# Patient Record
Sex: Female | Born: 1955
Health system: Southern US, Community
[De-identification: ages and names within clinical notes are randomized; demographics above are authoritative.]

## PROBLEM LIST (undated history)

## (undated) DIAGNOSIS — D689 Coagulation defect, unspecified: Secondary | ICD-10-CM

## (undated) DIAGNOSIS — I809 Phlebitis and thrombophlebitis of unspecified site: Secondary | ICD-10-CM

## (undated) DIAGNOSIS — R011 Cardiac murmur, unspecified: Secondary | ICD-10-CM

## (undated) DIAGNOSIS — E785 Hyperlipidemia, unspecified: Secondary | ICD-10-CM

## (undated) DIAGNOSIS — K219 Gastro-esophageal reflux disease without esophagitis: Secondary | ICD-10-CM

## (undated) DIAGNOSIS — M7660 Achilles tendinitis, unspecified leg: Secondary | ICD-10-CM

## (undated) DIAGNOSIS — Z86711 Personal history of pulmonary embolism: Principal | ICD-10-CM

## (undated) DIAGNOSIS — K7581 Nonalcoholic steatohepatitis (NASH): Secondary | ICD-10-CM

## (undated) DIAGNOSIS — Z86718 Personal history of other venous thrombosis and embolism: Secondary | ICD-10-CM

## (undated) DIAGNOSIS — M1990 Unspecified osteoarthritis, unspecified site: Secondary | ICD-10-CM

## (undated) DIAGNOSIS — K5792 Diverticulitis of intestine, part unspecified, without perforation or abscess without bleeding: Secondary | ICD-10-CM

## (undated) DIAGNOSIS — K5790 Diverticulosis of intestine, part unspecified, without perforation or abscess without bleeding: Secondary | ICD-10-CM

## (undated) DIAGNOSIS — J45909 Unspecified asthma, uncomplicated: Secondary | ICD-10-CM

## (undated) DIAGNOSIS — R42 Dizziness and giddiness: Secondary | ICD-10-CM

## (undated) DIAGNOSIS — K7460 Unspecified cirrhosis of liver: Secondary | ICD-10-CM

## (undated) DIAGNOSIS — Z8619 Personal history of other infectious and parasitic diseases: Secondary | ICD-10-CM

## (undated) DIAGNOSIS — G4730 Sleep apnea, unspecified: Secondary | ICD-10-CM

## (undated) DIAGNOSIS — I10 Essential (primary) hypertension: Secondary | ICD-10-CM

## (undated) DIAGNOSIS — M722 Plantar fascial fibromatosis: Secondary | ICD-10-CM

## (undated) HISTORY — PX: FOOT SURGERY: SHX648

## (undated) HISTORY — PX: KNEE SURGERY: SHX244

## (undated) HISTORY — DX: Essential (primary) hypertension: I10

## (undated) HISTORY — DX: Personal history of other venous thrombosis and embolism: Z86.718

## (undated) HISTORY — PX: KNEE ARTHROSCOPY: SHX127

## (undated) HISTORY — DX: Plantar fascial fibromatosis: M72.2

## (undated) HISTORY — PX: COLONOSCOPY: SHX174

## (undated) HISTORY — PX: ABDOMINAL HYSTERECTOMY: SHX81

## (undated) HISTORY — DX: Hyperlipidemia, unspecified: E78.5

## (undated) HISTORY — DX: Gastro-esophageal reflux disease without esophagitis: K21.9

## (undated) HISTORY — DX: Phlebitis and thrombophlebitis of unspecified site: I80.9

## (undated) HISTORY — PX: OTHER SURGICAL HISTORY: SHX169

## (undated) HISTORY — PX: KNEE ARTHROSCOPY: SUR90

## (undated) HISTORY — DX: Personal history of other infectious and parasitic diseases: Z86.19

## (undated) HISTORY — DX: Coagulation defect, unspecified: D68.9

## (undated) HISTORY — DX: Unspecified osteoarthritis, unspecified site: M19.90

## (undated) HISTORY — DX: Diverticulitis of intestine, part unspecified, without perforation or abscess without bleeding: K57.92

## (undated) HISTORY — DX: Cardiac murmur, unspecified: R01.1

## (undated) HISTORY — DX: Achilles tendinitis, unspecified leg: M76.60

## (undated) HISTORY — DX: Sleep apnea, unspecified: G47.30

## (undated) HISTORY — DX: Personal history of pulmonary embolism: Z86.711

---

## 2008-02-04 DIAGNOSIS — O2230 Deep phlebothrombosis in pregnancy, unspecified trimester: Secondary | ICD-10-CM

## 2008-02-04 HISTORY — DX: Deep phlebothrombosis in pregnancy, unspecified trimester: O22.30

## 2009-09-03 DEATH — deceased

## 2010-07-18 ENCOUNTER — Ambulatory Visit (INDEPENDENT_AMBULATORY_CARE_PROVIDER_SITE_OTHER): Admit: 2010-07-18 | Discharge: 2010-07-18 | Payer: Self-pay | Source: Ambulatory Visit

## 2014-08-09 ENCOUNTER — Encounter: Payer: Self-pay | Admitting: Rehabilitative and Restorative Service Providers"

## 2014-08-09 ENCOUNTER — Inpatient Hospital Stay
Payer: No Typology Code available for payment source | Attending: Internal Medicine | Admitting: Rehabilitative and Restorative Service Providers"

## 2014-08-09 VITALS — BP 112/74 | HR 68

## 2014-08-09 DIAGNOSIS — M25511 Pain in right shoulder: Secondary | ICD-10-CM | POA: Insufficient documentation

## 2014-08-09 DIAGNOSIS — M6281 Muscle weakness (generalized): Secondary | ICD-10-CM | POA: Insufficient documentation

## 2014-08-09 DIAGNOSIS — R29898 Other symptoms and signs involving the musculoskeletal system: Secondary | ICD-10-CM

## 2014-08-09 DIAGNOSIS — M25611 Stiffness of right shoulder, not elsewhere classified: Secondary | ICD-10-CM | POA: Insufficient documentation

## 2014-08-09 NOTE — PT/OT Therapy Note (Signed)
INITIAL EVALUATION (Shoulder)    Name: Monica Romero Age: 59 y.o. Occupation: Estate manager/land agent SOC: 08/09/2014  Referring Physician: Elyse Hsu, MD MD recheck: TBS DOS:  N/A DOI: Onset of Problem / Injury: 07/10/14  # of Authorized Visits:   Visit #     Diagnosis (Treating/Medical):     ICD-10-CM    1. Right shoulder pain M25.511    2. Decreased range of motion of right shoulder M25.611    3. Right arm weakness M62.81          SUBJECTIVE:    Mechanism of Injury: Patient states shoulder discomfort off and on since beginning of the year (2016).  Patient states she has history of fall in 2012 and has had problems with R side of her body since the fall.  Patient states pain is usually during the night.  Patient states the aches caused her to see PCP.  PCP thinks RTC but was unsure and referred to PT.  Patient states no images have been taken.      Patient's reason for seeking PT /Goal: Patient states trouble sleeping at night secondary to pain and getting comfortable as she sleeps on the R side but states other factors wake her up at night.  Patient with trouble with ADLs including showering, behind the back tasks or over head tasks.  Patient is not able to carry anything on R side secondary to pain and has started carrying everything in L hand or on L side.  Patient states this discomfort has not kept her from exercising or working as she works through the pain but she is not going as often.      Past Medical History:   Past Medical History   Diagnosis Date   . Hypertension    . DVT (deep vein thrombosis) in pregnancy 2010     Medications: No outpatient prescriptions have been marked as taking for the 08/09/14 encounter (Clinical Support) with Lourena Simmonds, PT.   Fall Risks: Low    Other Treatment/Prior Therapy: No  Prior Hospitalization: No  Hand Dominance: Dominant Hand: Right Involved Side: Involved Side: Right   DiagnosticTests: no recent images    Outcome Measure:         SPADI R Score: 67                                 % Pain Score: 37%  /10   PLOF: Patient states no previous shoulder injuries but has had on and off again shoulder irritation since fall in 2012.  Patient states that she was previously working out 3 times a week and walking around her neighborhood.  Patient was not previously limited with ADLs.  Living Environment: Type of Residence: One story home/apartment    Patient lives on second floor with no elevator.   Dwelling Entrance: # Steps to Enter: 3   Patient lives with: Living Arrangements: Alone    OBJECTIVE:    Vitals: BP: 112/74 mmHg Heart Rate: 68    IPTCSHOULDER  Observation/Posture/Gait/Integumentary:  Observation of posture: Deficits noted: Forward Head and Rounded Shoulders  Gait: without AD   Girth: None noted  Integumentary: No wound, lesion or rash noted  Palpation: Pain to palpation: anterior shoulder (biceps tendon), lateral shoulder, UT all on R shoulder Joint Mobility: hypomobile GH direction: PA and Inferior Scapula mobility: Abducted End Feel: Firm    Range of Motion: (degrees)  InitialRight  AROM  InitialRight  PROM   Right  AROM   Right PROM Shoulder InitialLeft AROM InitialLeft PROM   Left AROM   Left PROM   135 150   Flexion 180          Extension       105 115   Abduction 180      occiput 40   Internal Rotation T8      sacrum 35   External Rotation T4      (blank fields were intentionally left blank)    Cervical AROM: WFL and Limitations from previous neck injury with slight stiffness  Elbow/Wrist AROM: WFL    Strength:  Initial R   R UE Strength  MMT /5 Initial  L   L   3+*  Shoulder Flexion 5    3*  Shoulder Abduction 5      Shoulder Extension     3*  Shoulder External Rotation 5    3+  Shoulder Internal Rotation 5    3*  Serratus 4    3*  Rhomboids 4    4  Lower Trapezius 4    5  Biceps 5    5  Triceps 5    (blank fields were intentionally left blank)    Flexibility:    Comment:   Upper Trapezius Restricted Bilateral    Levator Scapulae NT    Pectoralis NT        Special  Tests/Neurological Screen:   R L  R  L   Neer Impingement (+) NT O'Brien NT NT   Hawkins Kennedy (+) NT Biceps Load NT NT   Active (Yocum) (+) NT Drop Arm NT NT   AC Shear NT NT Empty Can (+) NT   Sulcus NT NT Full Can (-) NT   Apprehension NT NT TOS Screen NT NT    NT NT  NT NT     Sensation to Light touch: Intact and Comment: with occasional reports of pins and needles in forearm of R arm    Treatment Initial Visit:  Evaluation (20 min)  Patient Education on pathology, PT role in rehab, POC, HEP, posture  Therapeutic exercise with instruction in HEP and provided patient written and illustrated handout Yes PT provided and demonstrated, patient practiced and PT answered all questions to patient satisfaction  Therapeutic Activity: PT educated patient on shoulder joint motions and how protecting or guarding the shoulder may do more harm than good progressing to frozen shoulder.  PT educated patient on the definition and cause/rehab of frozen shoulder and how to prevent while staying in pain free range of her shoulder.  PT educated patient on proper posture to allow for decreased irritation of anterior shoulder.  Manual: N/A  Modalities: None  Barriers to rehabilitation: None  Rehab Potential:good  Is patient aware of diagnosis: Yes  For Next Visit Add initiate therex to focus on postural awareness    Plan of Care / Updated Plan of Care IPTC Medicare Provider #: [phone]                Patient Name: Monica Romero  MRN: [id number]  DOI: Onset of Problem / Injury: 07/10/14 DOS: N/A  SOC: 08/09/2014    Diagnosis:     ICD-10-CM    1. Right shoulder pain M25.511    2. Decreased range of motion of right shoulder M25.611    3. Right arm weakness M62.81        ASSESSMENT: the patient is a  59 y.o. female presenting with R shoulder pain who requires Physical Therapy for the following:  Impairments: increased R shoulder pain, decreased R shoulder ROM, increased R arm weakness, impaired postural awareness    Functional Limitations:  Patient states trouble sleeping at night secondary to pain and getting comfortable as she sleeps on the R side but states other factors wake her up at night.  Patient with trouble with ADLs including showering, behind the back tasks or over head tasks.  Patient is not able to carry anything on R side secondary to pain and has started carrying everything in L hand or on L side.  Patient states this discomfort has not kept her from exercising or working as she works through the pain but she is not going as often.      Plan Of Care: Body Mechanics Education, NMR, Proprioceptive Activites, Electrical Stimulation, Instruction in HEP, Ultrasound, Therapeutic Exercise, Soft Tissue/Joint Mobilization GH/scapular grade I-IV and taping    Frequency/Duration: 2 times a week for 6 weeks. Anticipated D/C date: 09/20/14    Goals:  Date (Body Area, Impairment Goal, Functional   Activity, Target Performance) Time Frame Status Date/  Initial   08/09/2014   Patient will demonstrate independence in prescribed HEP with proper form, sets and reps for safe discharge to an independent program.  6W Initial Eval    08/09/2014   Decrease Shoulder Pain and Disability Index (SPADI) to <=35 to exceed Minimal Detectable Change (MDC) of 10 pts.   6W Initial Eval    08/09/2014   Increase AROM shoulder flexion to 165 deg and IR/ER top 65 deg to allow for increased ease with dressing and ADLs pain free.  6W Initial Eval    08/09/2014   Increase shoulder and scapular strength by 1 grade to be able to maintain proper postural alignment and lift and lower objects pain free.Stefan Church Initial Eval      Signature: Preston Fleeting, PT, DPT [place] #[id number]   Date: 08/09/2014    Signature: Elyse Hsu, MD ____________________________ Date:     Patient Name: Monica Romero  MRN: [id number]    Preston Fleeting, PT, DPT [place] #[id number]    08/09/2014    Time In/Out:  6:00 pm - 6:45 pm Total Treatment Time:  76'

## 2014-08-10 DIAGNOSIS — M25511 Pain in right shoulder: Secondary | ICD-10-CM | POA: Insufficient documentation

## 2014-08-10 DIAGNOSIS — M25611 Stiffness of right shoulder, not elsewhere classified: Secondary | ICD-10-CM | POA: Insufficient documentation

## 2014-08-10 DIAGNOSIS — R29898 Other symptoms and signs involving the musculoskeletal system: Secondary | ICD-10-CM | POA: Insufficient documentation

## 2014-08-10 NOTE — PT/OT Plan of Care (Signed)
Plan of Care / Updated Plan of Care IPTC Medicare Provider #: [phone]                Patient Name: Monica Romero  MRN: [id number]  DOI: Onset of Problem / Injury: 07/10/14 DOS: N/A  SOC: 08/09/2014    Diagnosis:     ICD-10-CM    1. Right shoulder pain M25.511    2. Decreased range of motion of right shoulder M25.611    3. Right arm weakness M62.81        ASSESSMENT: the patient is a 59 y.o. female presenting with R shoulder pain who requires Physical Therapy for the following:  Impairments: increased R shoulder pain, decreased R shoulder ROM, increased R arm weakness, impaired postural awareness    Functional Limitations: Patient states trouble sleeping at night secondary to pain and getting comfortable as she sleeps on the R side but states other factors wake her up at night.  Patient with trouble with ADLs including showering, behind the back tasks or over head tasks.  Patient is not able to carry anything on R side secondary to pain and has started carrying everything in L hand or on L side.  Patient states this discomfort has not kept her from exercising or working as she works through the pain but she is not going as often.      Plan Of Care: Body Mechanics Education, NMR, Proprioceptive Activites, Electrical Stimulation, Instruction in HEP, Ultrasound, Therapeutic Exercise, Soft Tissue/Joint Mobilization GH/scapular grade I-IV and taping    Frequency/Duration: 2 times a week for 6 weeks. Anticipated D/C date: 09/20/14    Goals:  Date (Body Area, Impairment Goal, Functional   Activity, Target Performance) Time Frame Status Date/  Initial   08/09/2014   Patient will demonstrate independence in prescribed HEP with proper form, sets and reps for safe discharge to an independent program.  6W Initial Eval    08/09/2014   Decrease Shoulder Pain and Disability Index (SPADI) to <=35 to exceed Minimal Detectable Change (MDC) of 10 pts.   6W Initial Eval    08/09/2014   Increase AROM shoulder flexion to 165 deg and IR/ER top 65 deg  to allow for increased ease with dressing and ADLs pain free.  6W Initial Eval    08/09/2014   Increase shoulder and scapular strength by 1 grade to be able to maintain proper postural alignment and lift and lower objects pain free.Stefan Church Initial Eval      Signature: Preston Fleeting, PT, DPT [place] #[id number]   Date: 08/09/2014    Signature: Elyse Hsu, MD ____________________________ Date:     Patient Name: Monica Romero  MRN: [id number]

## 2014-08-10 NOTE — PT/OT Exercise Plan (Signed)
Name: Monica Romero  Referring Physician: Elyse Hsu, MD  Diagnosis:     ICD-10-CM    1. Right shoulder pain M25.511    2. Decreased range of motion of right shoulder M25.611    3. Right arm weakness M62.81         Precautions:  none Date of Surgery:  N/A  MD Follow-up: TBS          Exercise Flow Sheet    Exercise Specifics 08/09/14  IE Date              UBE               manual   PROM  Mobs  STM              Cane ex Flex/ext/ir/er/abd                sa punches               ber/bhabd                 scap retract Rows/ext                Ball up wall               Abc on wall                                                     MOC  NT             Home Exercise Program    Provided see media             (Initials = supervised exercise by clinician)      Goals:  Date (Body Area, Impairment Goal, Functional   Activity, Target Performance) Time Frame Status Date/  Initial   08/09/2014   Patient will demonstrate independence in prescribed HEP with proper form, sets and reps for safe discharge to an independent program.  6W Initial Eval    08/09/2014   Decrease Shoulder Pain and Disability Index (SPADI) to <=35 to exceed Minimal Detectable Change (MDC) of 10 pts.   6W Initial Eval    08/09/2014   Increase AROM shoulder flexion to 165 deg and IR/ER top 65 deg to allow for increased ease with dressing and ADLs pain free.  6W Initial Eval    08/09/2014   Increase shoulder and scapular strength by 1 grade to be able to maintain proper postural alignment and lift and lower objects pain free..  6W Initial Eval

## 2014-08-12 ENCOUNTER — Inpatient Hospital Stay: Payer: No Typology Code available for payment source | Admitting: Rehabilitative and Restorative Service Providers"

## 2014-08-14 ENCOUNTER — Inpatient Hospital Stay: Payer: No Typology Code available for payment source | Attending: Internal Medicine

## 2014-08-14 DIAGNOSIS — R29898 Other symptoms and signs involving the musculoskeletal system: Secondary | ICD-10-CM

## 2014-08-14 DIAGNOSIS — M25511 Pain in right shoulder: Secondary | ICD-10-CM | POA: Insufficient documentation

## 2014-08-14 DIAGNOSIS — M25611 Stiffness of right shoulder, not elsewhere classified: Secondary | ICD-10-CM | POA: Insufficient documentation

## 2014-08-14 DIAGNOSIS — M6281 Muscle weakness (generalized): Secondary | ICD-10-CM | POA: Insufficient documentation

## 2014-08-14 NOTE — PT/OT Therapy Note (Addendum)
DAILY NOTE   08/14/2014     Time In/Out: 6:30 pm - 7:30 pm  Total Treatment Time: 55 min  Visit Number:  2    # of Authorized Visits:   Visit #:        Diagnosis (Treating/Medical):     ICD-10-CM    1. Right shoulder pain M25.511    2. Decreased range of motion of right shoulder M25.611    3. Right arm weakness M62.81            Subjective:  Monica Romero  Reports she had increased right shoulder discomfort over the weekend & feels she may have "over-done-it" with using her right arm / shoulder. Patient reports her right shoulder pain was about an 8/10 last night, causing her to have difficulty getting comfortable to sleep. Patient reports increased low back & right ankle pain today which resulted in decreased activity level today & now her shoulder is feeling better. Patient notes that on occasion her pain will travel into right hand with some numbness & tingling as well.     Functional Changes: (see subjective); Patient compliant with HEP. Patient has pain in right shoulder when sleeping, patient tends to lie on right side.      Objective:   Treatment:  Therapeutic Exercise: to improve: Flexibility/ROM and Strength   Unassisted Warm-up UBE x 6 min (5 min unassisted & 1 min assisted for subjective)  Modifications/Patient Education: Reviewed HEP / self-stretches with education to stop at point of stretch without crossing into painful positions to help decrease shoulder irritation, but address stretching / ROM, patient understood (Verbal, Visual and Tactile cues for working within tolerable ROM for stretch without increased pain level); patient performed several exercises in a seated position to decrease stress to low back.    NMR:  Cues for neutral postural awareness & avoiding compensatory motions / avoiding UT activation during all exercises for initiation of neuromuscular re-education. Initiated SA activation exercises for increased scapular stabilization - Alphabet on wall & supine SA punches.    Therapeutic Activities:   Educated patient in use of pillows under right arm when seated & supine to decrease stress to right shoulder to help decrease irritation. Also discussed use of pillows next to body when supine to use as tactile cue to help decrease tendency to lie on right side when sleeping.       Manual Therapy:   Right shoulder oscillations (gentle) for muscular relaxation   ST mobs right bicep & tendon  Right shoulder manual stretch into flexion, ER & IR  Trial of right posterior capsular stretch - see assessment       Current Measurements (ROM, Strength, Girth, Outcomes, etc.):   08/14/14: No new measurements taken today    Modalities: Electrical Stimulation with Ice: Premod 15 min. Location Right LS & anterior shoulder Position Recumbent with 2 pillows under right UE for support  Therapy Rationale: Decrease Pain, Decrease Spasm and Decrease Inflammation       Assessment (response to treatment):   Patient challenged to relax musculature during manual stretching, causing increased pain with eccentric motions, opted to use self-stretching exercises versus forcing manual techniques to avoid increased irritation. Patient experienced sciatic symptoms while standing, opted to alternate sitting & standing exercises as much as possible. Patient stated during treatment that she has a history of neck pain & a diagnosed cervical bulging disc as well, plan to monitor right UE radicular S&S.     Progress towards functional goals: Goal #1: Patient  compliant with current HEP.    Patient requires continued skilled care to: increase right scapular stability, increase right shoulder pain-free AROM & increase right shoulder strength to allow overhead motions without increased pain level.    Plan:  Continue with Plan of Care, consider postural taping, bicep tendon kinesiotape & [place] next time to assist with decreasing pain & inflammation.    Madaline Brilliant, PTA, CMT, CLT [place] [address] [place] [address]  08/14/2014      08/16/14  Addendum: Added specific information  to There-ex for assisted / unassisted time to justify total treatment time.  Madaline Brilliant, PTA, CMT, CLT [place] [phone] [place] 8119

## 2014-08-14 NOTE — PT/OT Exercise Plan (Signed)
Name: Monica Romero  Referring Physician:    Diagnosis:     ICD-10-CM    1. Right shoulder pain M25.511    2. Decreased range of motion of right shoulder M25.611    3. Right arm weakness M62.81         Precautions:  none Date of Surgery:  N/A  MD Follow-up: TBS          Exercise Flow Sheet    Exercise Specifics 08/09/14  IE 08/14/14              UBE   6 min            manual   PROM  Mobs  STM  See note  MM            Cane ex Flex/ext/ir/er/abd  Flex & ER supine x 15 each  MM    Standing ext & IR  X 10 each  MM              sa punches   Supine   No resist  X 15  MM            ber/bhabd     B ER   Seated  YTB  X 15  MM            scap retract Rows/ext  Rows  RTB  X 15  MM              Ball up wall   NT            Abc on wall     Yellow small physio  In flexion  1 x A-Z  MM                                                MOC  NT Ice/ES  X 15 min  MM            Home Exercise Program    Provided see media Reviewed  MM            (Initials = supervised exercise by clinician)      Goals:  Date (Body Area, Impairment Goal, Functional   Activity, Target Performance) Time Frame Status Date/  Initial   08/09/2014   Patient will demonstrate independence in prescribed HEP with proper form, sets and reps for safe discharge to an independent program.  6W Initial Eval    08/09/2014   Decrease Shoulder Pain and Disability Index (SPADI) to <=35 to exceed Minimal Detectable Change (MDC) of 10 pts.   6W Initial Eval    08/09/2014   Increase AROM shoulder flexion to 165 deg and IR/ER top 65 deg to allow for increased ease with dressing and ADLs pain free.  6W Initial Eval    08/09/2014   Increase shoulder and scapular strength by 1 grade to be able to maintain proper postural alignment and lift and lower objects pain free..  6W Initial Eval

## 2014-08-16 ENCOUNTER — Inpatient Hospital Stay: Payer: No Typology Code available for payment source

## 2014-08-19 ENCOUNTER — Inpatient Hospital Stay: Payer: No Typology Code available for payment source | Admitting: Rehabilitative and Restorative Service Providers"

## 2014-08-21 ENCOUNTER — Inpatient Hospital Stay
Payer: No Typology Code available for payment source | Attending: Internal Medicine | Admitting: Rehabilitative and Restorative Service Providers"

## 2014-08-21 DIAGNOSIS — M25511 Pain in right shoulder: Secondary | ICD-10-CM | POA: Insufficient documentation

## 2014-08-21 DIAGNOSIS — M6281 Muscle weakness (generalized): Secondary | ICD-10-CM | POA: Insufficient documentation

## 2014-08-21 DIAGNOSIS — R29898 Other symptoms and signs involving the musculoskeletal system: Secondary | ICD-10-CM

## 2014-08-21 DIAGNOSIS — M25611 Stiffness of right shoulder, not elsewhere classified: Secondary | ICD-10-CM | POA: Insufficient documentation

## 2014-08-21 NOTE — PT/OT Exercise Plan (Signed)
Name: Monica Romero  Referring Physician: Elyse Hsu, MD  Diagnosis:     ICD-10-CM    1. Right shoulder pain M25.511    2. Decreased range of motion of right shoulder M25.611    3. Right arm weakness M62.81         Precautions:  none Date of Surgery:  N/A  MD Follow-up: TBS          Exercise Flow Sheet    Exercise Specifics 08/09/14  IE 08/14/14 08/21/14             UBE   6 min NT  Patient 20 mins late  MJ           manual   PROM  Mobs  STM  See note  MM NT           Cane ex Flex/ext/ir/er/abd  Flex & ER supine x 15 each  MM    Standing ext & IR  X 10 each  MM Standing cane   Flex/er/ir/ext/abd  x15 ea  MJ             sa punches   Supine   No resist  X 15  MM SA punch x 15 B  MJ    Supine d2 flex  X 10 R only  MJ           ber/bhabd     B ER   Seated  YTB  X 15  MM ber   YTB standing  X 15   MJ           scap retract Rows/ext  Rows  RTB  X 15  MM Rows/ext  RTB x 15 ea  MJ             Ball up wall   NT NT           Abc on wall     Yellow small physio  In flexion  1 x A-Z  MM Yellow ball a-z  MJ                                               MOC  NT Ice/ES  X 15 min  MM Ice/es  MJ           Home Exercise Program    Provided see media Reviewed  MM            (Initials = supervised exercise by clinician)      Goals:  Date (Body Area, Impairment Goal, Functional   Activity, Target Performance) Time Frame Status Date/  Initial   08/09/2014   Patient will demonstrate independence in prescribed HEP with proper form, sets and reps for safe discharge to an independent program.  6W Initial Eval    08/09/2014   Decrease Shoulder Pain and Disability Index (SPADI) to <=35 to exceed Minimal Detectable Change (MDC) of 10 pts.   6W Initial Eval    08/09/2014   Increase AROM shoulder flexion to 165 deg and IR/ER top 65 deg to allow for increased ease with dressing and ADLs pain free.  6W Initial Eval    08/09/2014   Increase shoulder and scapular strength by 1 grade to be able to maintain proper postural alignment and lift and lower objects pain  free..  6W Initial Eval

## 2014-08-21 NOTE — PT/OT Therapy Note (Signed)
DAILY NOTE   08/21/2014     Time In/Out: 6:20 pm - 7:00 pm Total Treatment Time: 8' Visit Number:  3    # of Authorized Visits: 12 Visit #: 2      Diagnosis (Treating/Medical):     ICD-10-CM    1. Right shoulder pain M25.511    2. Decreased range of motion of right shoulder M25.611    3. Right arm weakness M62.81            Subjective:  Tuleen's pain is Increases with movement and is rated a 7/10.  Functional Changes: Patient states she thinks the pain is increased because of the rainy weather today.     Objective:   Treatment:  Therapeutic Exercise: to improve: Flexibility/ROM, Stabilization and Strength   No Warm-up patient was 20 mins late due to traffice  Modifications/Patient Education: all cane exercises performed in standing (Verbal and Tactile cues for proper exercise form)    NMR: ABCs w/ ball on wall shoulder flex, SA punches and d2 flexion in supine w/verbal & tactile cues to facilitate scapula stabilizers to improve endurance w/UE activities against gravity.        Current Measurements (ROM, Strength, Girth, Outcomes, etc.):   Range of Motion: (degrees)  InitialRight  AROM InitialRight  PROM seated  Right  AROM  7/18   Right PROM Shoulder InitialLeft AROM   135 150 150  Flexion 180       Extension    105 115 120  Abduction 180   sacrum 40 T10  Internal Rotation T8   occiput 35 T2  External Rotation T4   (blank fields were intentionally left blank)            Modalities: Electrical Stimulation with Ice: Premod 15 min. Location R shoulder Position Recumbant  Therapy Rationale: Decrease Pain, Decrease Inflammation and Decrease Edema       Assessment (response to treatment):   Patient demonstrates significant improvement in pain free AROM of R shoulder as noted above.  Patient fatigues quickly with alphabet on the wall but only reports muscle soreness and no pain.  Patient requires cueing to decreased UT compensation and for proper scapular placement.      Progress towards  functional goals: Progressing towards goal #3 ( Increase AROM shoulder flexion to 165 deg and IR/ER top 65 deg to allow for increased ease with dressing and ADLs pain free)    Patient requires continued skilled care to: increase scapular strength for improved postural awareness    Plan:  Continue with Plan of Care    Preston Fleeting, PT, DPT [place] #[id number]    08/21/2014

## 2014-08-23 ENCOUNTER — Inpatient Hospital Stay: Payer: No Typology Code available for payment source | Attending: Internal Medicine

## 2014-08-23 DIAGNOSIS — M6281 Muscle weakness (generalized): Secondary | ICD-10-CM | POA: Insufficient documentation

## 2014-08-23 DIAGNOSIS — R29898 Other symptoms and signs involving the musculoskeletal system: Secondary | ICD-10-CM

## 2014-08-23 DIAGNOSIS — M25511 Pain in right shoulder: Secondary | ICD-10-CM | POA: Insufficient documentation

## 2014-08-23 DIAGNOSIS — M25611 Stiffness of right shoulder, not elsewhere classified: Secondary | ICD-10-CM | POA: Insufficient documentation

## 2014-08-23 NOTE — PT/OT Therapy Note (Signed)
DAILY NOTE   08/23/2014     Time In/Out: 6:05 pm - 7:05 pm  Total Treatment Time: 88'  Visit Number:  4    # of Authorized Visits: 12 Visit #: 4      Diagnosis (Treating/Medical):     ICD-10-CM    1. Right shoulder pain M25.511    2. Decreased range of motion of right shoulder M25.611    3. Right arm weakness M62.81            Subjective:  Pattiann reports begin aware of right index finger burning that occurs off & on, but most notably when she is working on [employer] while "out in the field for work" (in [employer]). Patient also notes right neck pain & shoulder pain are usually "acting up" when she is aware of the right index finger symptoms.     Functional Changes: (see subjective)     Objective:   Treatment:  Therapeutic Exercise: to improve: Flexibility/ROM, Stabilization and Strength   Unassisted warm-up on UBE  Modifications/Patient Education: Continued there-ex for shoulder ROM & strengthening in pain-free ROM (Verbal, visual & Tactile cues for proper exercise form)    NMR: Continued ABCs w/ ball on wall in shoulder flex, SA punches and d2 flexion in supine with cues to facilitate scapula stabilizers to improve endurance w/UE activities against gravity - emphasis on scapular depression with tactile & verbal cues to avoid UT compensation.    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 Advised patient to position ipad on a table & bring the chair as close to the table as possible or slide to the edge of her seat to allow her to work with her elbows at her sides (versus outstretched) to decrease the effects of gravity on her shoulder / right UE. Advised patient she could try using her work bag as an arm rest when typing as well to decrease stress to her arm. Advised patient to use ipad case to elevate the ipad versus  bending her neck to look down at the ipad. Patient understood & noted feeling better with trial of adjustments in the clinical setting.      Manual Therapy:   Right shoulder oscillations (gentle) for muscular relaxation   ST mobs right bicep & tendon  Right shoulder manual stretch into flexion, abduction & ER & IR  Right posterior capsular glide with right arm in neutral position      Current Measurements (ROM, Strength, Girth, Outcomes, etc.):   08/23/14: No new measurements - ROM reassessed at last rx    Modalities: Electrical Stimulation with Ice: Premod 15 min. Location R shoulder Position Recumbent with pillow under right arm  Therapy Rationale: Decrease Pain, Decrease Inflammation and Decrease Edema       Assessment (response to treatment):   Patient demo decreased muscle guarding during MT, but remains guarded & painful with eccentric motions during MT. Patient able to complete there-ex without increased right shoulder pain or right index finger S&S. Patient continues to activate right UT with there-ex, but can mostly correct with cues. Patient noted feeling less right shoulder S&S with trial of ergonomic adjustments for carry-over to work day to decrease stress to right UE.       Progress towards functional goals: Reassessed at last rx    Patient requires continued skilled care to:  increase right scapular stability, increase right shoulder pain-free AROM & increase right shoulder strength to allow work activites without increased pain level.    Plan:  Continue with Plan of Care    Madaline Brilliant, PTA, CMT, CLT [place] [address] [place] [address]  08/23/2014

## 2014-08-23 NOTE — PT/OT Exercise Plan (Signed)
Name: Monica Romero  Referring Physician: Elyse Hsu, MD  Diagnosis:     ICD-10-CM    1. Right shoulder pain M25.511    2. Decreased range of motion of right shoulder M25.611    3. Right arm weakness M62.81         Precautions:  none Date of Surgery:  N/A  MD Follow-up: TBS          Exercise Flow Sheet    Exercise Specifics 08/09/14  IE 08/14/14 08/21/14 08/23/14            UBE   6 min NT  Patient 20 mins late  MJ 6 min  (2 min)  MM          manual   PROM  Mobs  STM  See note  MM NT See note  MM          Cane ex Flex/ext/ir/er/abd  Flex & ER supine x 15 each  MM    Standing ext & IR  X 10 each  MM Standing cane   Flex/er/ir/ext/abd  x15 ea  MJ Standing cane   Flex/er/ir/ext/abd  x15 ea  MM            sa punches   Supine   No resist  X 15  MM SA punch x 15 B  MJ    Supine d2 flex  X 10 R only  MJ SA punch x 15 B  MM    Supine d2 flex  X 10 R only  MM          ber/bhabd     B ER   Seated  YTB  X 15  MM ber   YTB standing  X 15   MJ B ER  RTB  X 10  MM          scap retract Rows/ext  Rows  RTB  X 15  MM Rows/ext  RTB x 15 ea  MJ Rows/ext  RTB x 15 ea  MM            Ball up wall   NT NT 10 x  MM          Abc on wall     Yellow small physio  In flexion  1 x A-Z  MM Yellow ball a-z  MJ Yellow small physio  In flexion  1 x A-Z  MM                                              MOC  NT Ice/ES  X 15 min  MM Ice/es  MJ Ice/ES  MM          Home Exercise Program    Provided see media Reviewed  MM            (Initials = supervised exercise by clinician)      Goals:  Date (Body Area, Impairment Goal, Functional   Activity, Target Performance) Time Frame Status Date/  Initial   08/09/2014   Patient will demonstrate independence in prescribed HEP with proper form, sets and reps for safe discharge to an independent program.  6W Initial Eval    08/09/2014   Decrease Shoulder Pain and Disability Index (SPADI) to <=35 to exceed Minimal Detectable Change (MDC) of 10 pts.   6W Initial Eval    08/09/2014  Increase AROM shoulder flexion to 165 deg and  IR/ER top 65 deg to allow for increased ease with dressing and ADLs pain free.  6W Initial Eval    08/09/2014   Increase shoulder and scapular strength by 1 grade to be able to maintain proper postural alignment and lift and lower objects pain free..  6W Initial Eval

## 2014-08-28 ENCOUNTER — Inpatient Hospital Stay
Payer: No Typology Code available for payment source | Attending: Internal Medicine | Admitting: Rehabilitative and Restorative Service Providers"

## 2014-08-28 DIAGNOSIS — M6281 Muscle weakness (generalized): Secondary | ICD-10-CM | POA: Insufficient documentation

## 2014-08-28 DIAGNOSIS — M25611 Stiffness of right shoulder, not elsewhere classified: Secondary | ICD-10-CM | POA: Insufficient documentation

## 2014-08-28 DIAGNOSIS — R29898 Other symptoms and signs involving the musculoskeletal system: Secondary | ICD-10-CM

## 2014-08-28 DIAGNOSIS — M25511 Pain in right shoulder: Secondary | ICD-10-CM | POA: Insufficient documentation

## 2014-08-28 NOTE — PT/OT Exercise Plan (Signed)
Name: Monica Romero  Referring Physician: Elyse Hsu, MD  Diagnosis:     ICD-10-CM    1. Right shoulder pain M25.511    2. Decreased range of motion of right shoulder M25.611    3. Right arm weakness M62.81         Precautions:  none Date of Surgery:  N/A  MD Follow-up: TBS          Exercise Flow Sheet    Exercise Specifics 08/09/14  IE 08/14/14 08/21/14 08/23/14 08/28/14           UBE   6 min NT  Patient 20 mins late  MJ 6 min  (2 min)  MM 6'  MJ  10 mins late         manual   PROM  Mobs  STM  See note  MM NT See note  MM NT         Cane ex Flex/ext/ir/er/abd  Flex & ER supine x 15 each  MM    Standing ext & IR  X 10 each  MM Standing cane   Flex/er/ir/ext/abd  x15 ea  MJ Standing cane   Flex/er/ir/ext/abd  x15 ea  MM Rev push up x 15  MJ           sa punches   Supine   No resist  X 15  MM SA punch x 15 B  MJ    Supine d2 flex  X 10 R only  MJ SA punch x 15 B  MM    Supine d2 flex  X 10 R only  MM SA punch  x20 B  MJ      d2 flex  Supine  x15 R  MJ         ber/bhabd     B ER   Seated  YTB  X 15  MM ber   YTB standing  X 15   MJ B ER  RTB  X 10  MM Ber/bhabd  RTB x 15 ea  MJ         scap retract Rows/ext  Rows  RTB  X 15  MM Rows/ext  RTB x 15 ea  MJ Rows/ext  RTB x 15 ea  MM RTB   x 15 ea  MJ           Ball up wall   NT NT 10 x  MM Ball toss   2# OH  x20  MJ         Abc on wall     Yellow small physio  In flexion  1 x A-Z  MM Yellow ball a-z  MJ Yellow small physio  In flexion  1 x A-Z  MM 2#   A-z  On wall in flex  MJ                 SL ER/habd/abd  x15 ea   MJ                            MOC  NT Ice/ES  X 15 min  MM Ice/es  MJ Ice/ES  MM Ice/es  MJ         Home Exercise Program    Provided see media Reviewed  MM            (Initials = supervised exercise by clinician)      Goals:  Date (Body Area, Impairment Goal,  Functional   Activity, Target Performance) Time Frame Status Date/  Initial   08/09/2014   Patient will demonstrate independence in prescribed HEP with proper form, sets and reps for safe discharge to an  independent program.  6W Initial Eval    08/09/2014   Decrease Shoulder Pain and Disability Index (SPADI) to <=35 to exceed Minimal Detectable Change (MDC) of 10 pts.   6W Initial Eval    08/09/2014   Increase AROM shoulder flexion to 165 deg and IR/ER top 65 deg to allow for increased ease with dressing and ADLs pain free.  6W Initial Eval    08/09/2014   Increase shoulder and scapular strength by 1 grade to be able to maintain proper postural alignment and lift and lower objects pain free..  6W Initial Eval

## 2014-08-28 NOTE — PT/OT Therapy Note (Signed)
DAILY NOTE   08/28/2014     Time In/Out: 6:05 pm - 7:00 pm Total Treatment Time: 64' Visit Number:  5    # of Authorized Visits: 12 Visit #: 5      Diagnosis (Treating/Medical):     ICD-10-CM    1. Right shoulder pain M25.511    2. Decreased range of motion of right shoulder M25.611    3. Right arm weakness M62.81            Subjective:  Monica Romero's pain is Increases with movement and is rated a 5/10.  Functional Changes: Patient states pain is not that bad and it is tolerable.     Objective:   Treatment:  Therapeutic Exercise: to improve: Flexibility/ROM, Stabilization and Strength   Assisted Warm-up on UBE x6' while subjective was taken  Modifications/Patient Education: added 2# ball to ball circles, ball toss, SL er/habd/abd (Verbal and Tactile cues for proper exercise form)    NMR: Patient performs ABCs w/2# ball on wall shoulder flex, SA punches, reverse push ups w/verbal & tactile cues to facilitate scapula stabilizers to improve endurance w/UE activities against gravity.        Current Measurements (ROM, Strength, Girth, Outcomes, etc.):   None taken today.    Modalities: Electrical Stimulation with Ice: Premod 15 min. Location R shoulder Position Recumbant  Therapy Rationale: Decrease Pain, Decrease Inflammation and Decrease Edema       Assessment (response to treatment):   Patient fatigues quickly with ball circles with 2# ball requiring rest breaks and cueing for form.  Patient with discomfort during SL habd but was able to complete when modified range to stay within pain free range.  Patient requires increased cueing for SL exercises for proper scapular movements to decrease discomfort.     Progress towards functional goals: Progressing towards goal #4 (Increase shoulder and scapular strength by 1 grade to be able to maintain proper postural alignment and lift and lower objects pain free.Marland Kitchen )    Patient requires continued skilled care to: increase scapular activation to improved scapulohumeral  rhythm.    Plan:  Continue with Plan of Care    [place], [place], DPT [place] [id number]    08/28/2014

## 2014-08-30 ENCOUNTER — Inpatient Hospital Stay: Payer: No Typology Code available for payment source | Admitting: Rehabilitative and Restorative Service Providers"

## 2014-09-04 ENCOUNTER — Inpatient Hospital Stay: Payer: No Typology Code available for payment source | Attending: Internal Medicine

## 2014-09-04 DIAGNOSIS — M25511 Pain in right shoulder: Secondary | ICD-10-CM | POA: Insufficient documentation

## 2014-09-04 DIAGNOSIS — R29898 Other symptoms and signs involving the musculoskeletal system: Secondary | ICD-10-CM

## 2014-09-04 DIAGNOSIS — M25611 Stiffness of right shoulder, not elsewhere classified: Secondary | ICD-10-CM | POA: Insufficient documentation

## 2014-09-04 DIAGNOSIS — M6281 Muscle weakness (generalized): Secondary | ICD-10-CM | POA: Insufficient documentation

## 2014-09-04 NOTE — PT/OT Exercise Plan (Signed)
Name: Monica Romero  Referring Physician: Daisy Blossom, MD  Diagnosis:     ICD-10-CM    1. Right shoulder pain M25.511    2. Decreased range of motion of right shoulder M25.611    3. Right arm weakness M62.81         Precautions:  none Date of Surgery:  N/A  MD Follow-up: TBS          Exercise Flow Sheet    Exercise Specifics 08/09/14  IE 08/14/14 08/21/14 08/23/14 08/28/14 09/04/14          UBE   6 min NT  Patient 20 mins late  MJ 6 min  (2 min)  MM 6'  MJ  10 mins late 6 min  MM        manual   PROM  Mobs  STM  See note  MM NT See note  MM NT See note  MM        Cane ex Flex/ext/ir/er/abd  Flex & ER supine x 15 each  MM    Standing ext & IR  X 10 each  MM Standing cane   Flex/er/ir/ext/abd  x15 ea  MJ Standing cane   Flex/er/ir/ext/abd  x15 ea  MM Rev push up x 15  MJ Reverse Push Ups  X 20  MM          sa punches   Supine   No resist  X 15  MM SA punch x 15 B  MJ    Supine d2 flex  X 10 R only  MJ SA punch x 15 B  MM    Supine d2 flex  X 10 R only  MM SA punch  x20 B  MJ      d2 flex  Supine  x15 R  MJ SA punch w/ 2#   X 15  MM    D2 felx  No resist   supine  X 15  MM        ber/bhabd     B ER   Seated  YTB  X 15  MM ber   YTB standing  X 15   MJ B ER  RTB  X 10  MM Ber/bhabd  RTB x 15 ea  MJ B ER & H. abd RTB  X 15 each  MM        scap retract Rows/ext  Rows  RTB  X 15  MM Rows/ext  RTB x 15 ea  MJ Rows/ext  RTB x 15 ea  MM RTB   x 15 ea  MJ Rows & ext  RTB  X 20 each  MM          Ball up wall   NT NT 10 x  MM Ball toss   2# OH  x20  MJ Ball toss   2# OH  x20  MM        Abc on wall     Yellow small physio  In flexion  1 x A-Z  MM Yellow ball a-z  MJ Yellow small physio  In flexion  1 x A-Z  MM 2#   A-z  On wall in flex  MJ 2#   A-Z  On wall in flex  MM                SL ER/habd/abd  x15 ea   MJ SL ER  2# x 15  MM     Habd/abd  No resist  x15 ea   MM                           MOC  NT Ice/ES  X 15 min  MM Ice/es  MJ Ice/ES  MM Ice/es  MJ Ice  ES  X 15 min  MM        Home Exercise Program    Provided see media Reviewed  MM     Added tband ex to HEP  MM        (Initials = supervised exercise by clinician)      Goals:  Date (Body Area, Impairment Goal, Functional   Activity, Target Performance) Time Frame Status Date/  Initial   08/09/2014   Patient will demonstrate independence in prescribed HEP with proper form, sets and reps for safe discharge to an independent program.  6W Initial Eval    08/09/2014   Decrease Shoulder Pain and Disability Index (SPADI) to <=35 to exceed Minimal Detectable Change (MDC) of 10 pts.   6W Initial Eval    08/09/2014   Increase AROM shoulder flexion to 165 deg and IR/ER top 65 deg to allow for increased ease with dressing and ADLs pain free.  6W Initial Eval    08/09/2014   Increase shoulder and scapular strength by 1 grade to be able to maintain proper postural alignment and lift and lower objects pain free..  6W Initial Eval

## 2014-09-04 NOTE — PT/OT Therapy Note (Signed)
DAILY NOTE   09/04/2014     Time In/Out: 6:00 pm - 7:00 pm  Total Treatment Time: 68'  Visit Number:  6    # of Authorized Visits: 12 Visit #: 6      Diagnosis (Treating/Medical):     ICD-10-CM    1. Right shoulder pain M25.511    2. Decreased range of motion of right shoulder M25.611    3. Right arm weakness M62.81            Subjective:  Monica Romero reports the right shoulder isn't "doing too bad, it's just the other stuff." Patient reports increased right achilles pain, saw MD last week & has been placed back on Mobic. Overall patient reports feeling as if the right shoulder is having less pain, even when she moves into a more painful position, as when reaching at an angle.    Functional Changes: (see subjective); Patient notes keeping arms in a more supported position while using ipad at work has helped decrease her pain level. Patient reports using a backpack versus a pull bag or carrying bag with her right arm.      Objective:   Treatment:  Therapeutic Exercise: to improve: Flexibility/ROM, Stabilization and Strength   Assisted Warm-up on UBE x6' while subjective was taken  Modifications/Patient Education: Continued there-ex for right shoulder & upper quadrant strengthening & scapular stabilization; added weight to SL ER for increased muscular challenge; added weight to SA punches for increase SA challenge (Verbal, Tactile & visual cues for proper exercise form & to avoid ROM that increases pain level); Progressed HEP - patient given written directions (see scan)    NMR: Continue ABCs w/2# ball on wall shoulder flex, SA punches & reverse push ups w/verbal & tactile cues to facilitate scapula stabilizers to improve UE stability & endurance w/UE activities against gravity.      Manual Therapy:   Right shoulder oscillations (gentle) for muscular relaxation   ST mobs right bicep & right cervical PVM  Right shoulder manual stretch into flexion, abduction & ER & IR  Right posterior capsular glide with right arm in neutral  position       Current Measurements (ROM, Strength, Girth, Outcomes, etc.):   Range of Motion: (degrees)  InitialRight  AROM InitialRight  PROM seated  Right  AROM  7/18 09/04/14  Right AROM Shoulder InitialLeft AROM   135 150 150 165  (painful arch 150-165) Flexion 180       Extension    105 115 120 145 Abduction 180   sacrum 40 T10  Internal Rotation T8   occiput 35 T2  External Rotation T4   (blank fields were intentionally left blank)                  Modalities: Electrical Stimulation with Ice: Premod 15 min. Location R shoulder Position Recumbent with pillow under right UE for support  Therapy Rationale: Decrease Pain, Decrease Inflammation and Decrease Edema       Assessment (response to treatment):   Patient verbalized challenge & fatigue after completion of wall alphabet. Patient experienced some right shoulder popping & discomfort with side-lying abd greater than 100 degrees, advised to keep within tolerable ROM to avoid increased irritation of shoulder joint.    Progress towards functional goals: Progressing towards goal #3 - Patient's current right shoulder flexion AROM is 165 degrees (with a painful arch from 150 - 165).    Patient requires continued skilled care to: increase right scapular stability, increase  right shoulder pain-free AROM & increase right shoulder strength to allow work activites without increased pain level.    Plan:  Continue with Plan of Care, Begin PREs NT.    Madaline Brilliant, PTA, CMT, CLT [place] [address] [place] [address]  09/04/2014

## 2014-09-06 ENCOUNTER — Ambulatory Visit (INDEPENDENT_AMBULATORY_CARE_PROVIDER_SITE_OTHER): Payer: No Typology Code available for payment source

## 2014-09-06 ENCOUNTER — Encounter (INDEPENDENT_AMBULATORY_CARE_PROVIDER_SITE_OTHER): Payer: Self-pay

## 2014-09-06 ENCOUNTER — Ambulatory Visit (INDEPENDENT_AMBULATORY_CARE_PROVIDER_SITE_OTHER): Payer: No Typology Code available for payment source | Admitting: Nurse Practitioner

## 2014-09-06 ENCOUNTER — Inpatient Hospital Stay
Payer: No Typology Code available for payment source | Attending: Internal Medicine | Admitting: Rehabilitative and Restorative Service Providers"

## 2014-09-06 VITALS — BP 118/80 | HR 70 | Temp 98.1°F | Resp 14 | Ht 67.0 in | Wt 225.0 lb

## 2014-09-06 DIAGNOSIS — M79642 Pain in left hand: Secondary | ICD-10-CM

## 2014-09-06 DIAGNOSIS — M25511 Pain in right shoulder: Secondary | ICD-10-CM | POA: Insufficient documentation

## 2014-09-06 DIAGNOSIS — Z23 Encounter for immunization: Secondary | ICD-10-CM

## 2014-09-06 DIAGNOSIS — M6281 Muscle weakness (generalized): Secondary | ICD-10-CM | POA: Insufficient documentation

## 2014-09-06 DIAGNOSIS — S6992XA Unspecified injury of left wrist, hand and finger(s), initial encounter: Secondary | ICD-10-CM

## 2014-09-06 DIAGNOSIS — IMO0002 Reserved for concepts with insufficient information to code with codable children: Secondary | ICD-10-CM

## 2014-09-06 DIAGNOSIS — S61309A Unspecified open wound of unspecified finger with damage to nail, initial encounter: Secondary | ICD-10-CM

## 2014-09-06 DIAGNOSIS — M25611 Stiffness of right shoulder, not elsewhere classified: Secondary | ICD-10-CM | POA: Insufficient documentation

## 2014-09-06 DIAGNOSIS — R29898 Other symptoms and signs involving the musculoskeletal system: Secondary | ICD-10-CM

## 2014-09-06 MED ORDER — IBUPROFEN 800 MG PO TABS
800.0000 mg | ORAL_TABLET | Freq: Three times a day (TID) | ORAL | Status: DC | PRN
Start: 2014-09-06 — End: 2014-09-12

## 2014-09-06 MED ORDER — IBUPROFEN 400 MG PO TABS
800.0000 mg | ORAL_TABLET | Freq: Once | ORAL | Status: AC
Start: 2014-09-06 — End: 2014-09-06
  Administered 2014-09-06: 800 mg via ORAL

## 2014-09-06 NOTE — Progress Notes (Signed)
Subjective:       Patient ID: Monica Romero is a 59 y.o. female.    Chief Complaint   Patient presents with   . Hand Pain     Lt. hand pain due to jamming hand while doing PT with a weighted ball earlier today 8.3.16      Hand Pain   The incident occurred 1 to 3 hours ago. Incident location: at PT. The injury mechanism was a direct blow (Jammed left 2nd digit on ball). The pain is present in the left fingers. The quality of the pain is described as aching. The pain does not radiate. The pain is at a severity of 10/10. The pain is severe. The pain has been constant since the incident. Pertinent negatives include no chest pain, muscle weakness, numbness or tingling. The symptoms are aggravated by movement and palpation. She has tried ice for the symptoms.     Patient reports nail is loose.    Tetanus not UTD    PCP: [place]    The following portions of the patient's history were reviewed and updated as appropriate: allergies, current medications, past family history, past medical history, past social history, past surgical history and problem list.    Review of Systems   Constitutional: Negative for fever.   Cardiovascular: Negative for chest pain.   Musculoskeletal: Positive for arthralgias.   Skin: Positive for wound.   Neurological: Negative for tingling and numbness.           Objective:     Physical Exam   Nursing note and vitals reviewed.  Constitutional: She is oriented to person, place, and time. She appears well-developed and well-nourished. She is cooperative. She appears distressed.   Pain distress   HENT:   Head: Normocephalic and atraumatic.   Eyes: Conjunctivae and lids are normal.   Neck: Normal range of motion. Neck supple.   Musculoskeletal:        Left hand: She exhibits tenderness and swelling.        Hands:  Half of nail on 2nd digit avulsed. No active bleeding.   Neurological: She is alert and oriented to person, place, and time.   Skin: Skin is warm and dry. She is not diaphoretic.   Psychiatric:  She has a normal mood and affect. Her behavior is normal. Thought content normal.     BP 118/80 mmHg  Pulse 70  Temp(Src) 98.1 F (36.7 C) (Oral)  Resp 14  Ht 1.702 m (5\' 7" )  Wt 102.059 kg (225 lb)  BMI 35.23 kg/m2    X-ray Hand Left Pa Lateral And Oblique    09/06/2014    Soft tissue injury  Charlene Brooke, MD  09/06/2014 7:30 PM     Procedure Name: Digital Block  Indication: Local anesthesia to the affected digit.  Risks and benefits discussed with patient before procedure started.  Digitial block given using lidocaine 1% without epinephrine, 1.5 mL instilled in the webspace on medial and lateral aspect of involved digit.   Patient tolerated procedure well.  No complications.    Procedure Note: Nail reinsertion  Nail bed cleaned with betadine and shur-clens.  Nail reinserted into nail bed.    Dermabond applied.  Bandage and splint applied to finger.  Patient tolerated procedure well.      Assessment:       Fingernail injury to left 2nd digit  Avulsion of nail.  Hand pain  Need for Tdap       Plan:  Tdap given during visit.   Ibuprofen 800 mg given during visit.     Ibuprofen 800 mg every 8 hours as needed with food.  Do not take ibuprofen and mobic together as they are both NSAIDs.  Keep wound clean and dry.  Change bandage daily.  Do not apply ointments to glue.  Wear finger splint to protect/cushion nail.  Elevate.  Ice.    Follow-up with PCP or return to clinic as needed for signs of infection.  Reviewed AVS.  Patient verbalized understanding.

## 2014-09-06 NOTE — PT/OT Exercise Plan (Signed)
Name: Monica Romero  Referring Physician: Daisy Blossom, MD  Diagnosis:     ICD-10-CM    1. Right shoulder pain M25.511    2. Decreased range of motion of right shoulder M25.611    3. Right arm weakness M62.81         Precautions:  none Date of Surgery:  N/A  MD Follow-up: TBS          Exercise Flow Sheet    Exercise Specifics 08/09/14  IE 08/14/14 08/21/14 08/23/14 08/28/14 09/04/14 09/06/14         UBE   6 min NT  Patient 20 mins late  MJ 6 min  (2 min)  MM 6'  MJ  10 mins late 6 min  MM 6'  MJ       manual   PROM  Mobs  STM  See note  MM NT See note  MM NT See note  MM See note  MJ       Cane ex Flex/ext/ir/er/abd  Flex & ER supine x 15 each  MM    Standing ext & IR  X 10 each  MM Standing cane   Flex/er/ir/ext/abd  x15 ea  MJ Standing cane   Flex/er/ir/ext/abd  x15 ea  MM Rev push up x 15  MJ Reverse Push Ups  X 20  MM X 20   Rev  MJ         sa punches   Supine   No resist  X 15  MM SA punch x 15 B  MJ    Supine d2 flex  X 10 R only  MJ SA punch x 15 B  MM    Supine d2 flex  X 10 R only  MM SA punch  x20 B  MJ      d2 flex  Supine  x15 R  MJ SA punch w/ 2#   X 15  MM    D2 felx  No resist   supine  X 15  MM NT          NT       ber/bhabd     B ER   Seated  YTB  X 15  MM ber   YTB standing  X 15   MJ B ER  RTB  X 10  MM Ber/bhabd  RTB x 15 ea  MJ B ER & H. abd RTB  X 15 each  MM See tb 6 way       scap retract Rows/ext  Rows  RTB  X 15  MM Rows/ext  RTB x 15 ea  MJ Rows/ext  RTB x 15 ea  MM RTB   x 15 ea  MJ Rows & ext  RTB  X 20 each  MM Tb 6 way   X 6 ea  RTB  HEP  MJ         Ball up wall   NT NT 10 x  MM Ball toss   2# OH  x20  MJ Ball toss   2# OH  x20  MM 2#   x20   OH and 90/90       Abc on wall     Yellow small physio  In flexion  1 x A-Z  MM Yellow ball a-z  MJ Yellow small physio  In flexion  1 x A-Z  MM 2#   A-z  On wall in flex  MJ 2#   A-Z  On wall in flex  MM NT               SL ER/habd/abd  x15 ea   MJ SL ER  2# x 15  MM     Habd/abd  No resist  x15 ea   MM NT        Tb 6 way  MJ                          MOC  NT  Ice/ES  X 15 min  MM Ice/es  MJ Ice/ES  MM Ice/es  MJ Ice  ES  X 15 min  MM NT       Home Exercise Program    Provided see media Reviewed  MM    Added tband ex to HEP  MM Added tb 6 way  MJ       (Initials = supervised exercise by clinician)    Plan Of Care: Body Mechanics Education, NMR, Proprioceptive Activites, Electrical Stimulation, Instruction in HEP, Ultrasound, Therapeutic Exercise, Soft Tissue/Joint Mobilization GH/scapular grade I-IV and taping    Frequency/Duration: 2 times a week for 6 weeks. Anticipated D/C date: 09/20/14    Goals:  Date (Body Area, Impairment Goal, Functional   Activity, Target Performance) Time Frame Status Date/  Initial   08/09/2014   Patient will demonstrate independence in prescribed HEP with proper form, sets and reps for safe discharge to an independent program.  6W progressing 09/06/14  MJ   08/09/2014   Decrease Shoulder Pain and Disability Index (SPADI) to <=35 to exceed Minimal Detectable Change (MDC) of 10 pts.   6W Progressing - 36 09/06/14  MJ   08/09/2014   Increase AROM shoulder flexion to 165 deg and IR/ER top 65 deg to allow for increased ease with dressing and ADLs pain free.  6W Progressing - painful arch 09/06/14  MJ   08/09/2014   Increase shoulder and scapular strength by 1 grade to be able to maintain proper postural alignment and lift and lower objects pain free.Marland Kitchen  6W progressing 09/06/14  MJ

## 2014-09-06 NOTE — Patient Instructions (Addendum)
Ibuprofen 800 mg every 8 hours as needed with food.  Do not take ibuprofen and mobic together as they are both NSAIDs.  Keep wound clean and dry.  Change bandage daily.  Do not apply ointments to glue.  Wear finger splint to protect/cushion nail.  Elevate.  Ice.    Follow-up with PCP or return to clinic as needed for signs of infection.    Nail Injury (Partial Finger/Toe Nail Plate Avulsion)  Some injuries to a finger or toe can cause loosening of the nail. Sometimes there is a cutin the nail bed or a fracture of the bone under the nail. If the nail is more severely injured, it may fall off completely in 1-2 weeks. This is not serious and in most cases, the nail will grow back from under the cuticle. This takes a few weeks to start and is complete in about 4-6 months for a fingernail and 12 months for a toenail. If the nail bed was damaged, the nail may grow back with a rough or irregular shape. Sometimes the nail may not regrow at all.    Home care  The following guidelines will help you care for your wound at home:   Keep the injured part elevated to reduce pain and swelling. This is very important during the first 48 hours.   Make an ice pack (ice cubes in a plastic bag, wrapped in a towel) and apply for 20 minutes every two hours during the first day, then 3-4 times a day to reduce swelling and pain until the swelling goes down.   You may use acetaminophen or ibuprofen to control pain, unless another pain medicine was prescribed.If you have chronic liver or kidney disease or ever had a stomach ulcer or GI bleeding, talk with your doctor before using these medicines. Do not use ibuprofen in children under six months of age.   If a bandage was applied, change it once a day, unless told otherwise. Be careful not to pull on the nail when removing the dressing. If necessary, soak the dressing off while holding your finger or toe under warm running water.   Follow-up care  Follow up with your doctor or this  facility as directed.  Note:If X-rays were taken, they will be reviewed by a radiologist. You will be notified of any new findings that may affect your care.  When to seek medical advice  Call your health care provider right awayif any of these occur:   Pain or swelling increase   Redness around the nail   Pus (creamy white or yellow fluid) draining from the nail   Fever of 100.57F (38C) or higher, or as directed by your health care provider   2000-2015 [employer] [place]. [address] [place], [place] [address]. All rights reserved. This information is not intended as a substitute for professional medical care. Always follow your healthcare professional's instructions.

## 2014-09-06 NOTE — PT/OT Therapy Note (Signed)
DAILY NOTE   09/06/2014     Time In/Out: 6:00 pm - 6:50 pm Total Treatment Time: 42' Tx  Visit Number:  7    # of Authorized Visits: 12 Visit #: 7      Diagnosis (Treating/Medical):     ICD-10-CM    1. Right shoulder pain M25.511    2. Decreased range of motion of right shoulder M25.611    3. Right arm weakness M62.81            Subjective:  Monica Romero's pain is Increases with movement and is rated a 0/10.  Functional Changes: Patient states no pain upon arrival but states she has difficulty with tasks at home because she feels like her arm is weak.  Patient would like to do trial of HEP for 2 weeks to decide if she could continue to strengthen on her own or feels she needs to continue with PT.     Objective:   Treatment:  Therapeutic Exercise: to improve: Flexibility/ROM, Stabilization and Strength   Assisted Warm-up on UBE x6' while subjective was taken  Modifications/Patient Education: added TB 6 way (Verbal and Tactile cues for proper exercise form)  PT reviewed previous HEP and added to home exercises, patient practices and demonstrates understanding of proper form and PT answered all questions to patient satisfaction.    NMR:  Patient performs TB 6 way, ber, rev push ups to facilitate shoulder and scapular mm co-contraction & to facilitate scapula stabilizer recruitment when reaching out & overhead    Manual Therapy:   PROM to R shoulder  GH mobs grade II  Scapular mobs grade II  Reassessment for progress note      Current Measurements (ROM, Strength, Girth, Outcomes, etc.):   Outcome Measure:    SPADI R Score: 67 (IE)         36 (09/06/14) Optimal 0                           % Pain Score: 37%  (IE)   27 (09/06/14) Optimal 0  Rate of Current Satisfaction 5/10 (IE) 6/10 (09/06/14)     Observation/Posture/Gait/Integumentary:  Observation of posture: Deficits noted: Forward Head and Rounded Shoulders  Palpation: Pain to palpation: anterior shoulder (biceps tendon), lateral shoulder, UT all on R shoulder (IE) no pain with  palpation (09/06/14)  Joint Mobility: hypomobile GH direction: PA and Inferior (IE) WFL (09/06/14) Scapula mobility: Abducted End Feel: Firm    Range of Motion: (degrees)  InitialRight  AROM   InitialRight   PROM   seated   Right  AROM  7/18   09/04/14   Right AROM    R  AROM  09/06/14 Shoulder    InitialLeft AROM      135    150    150    165   (painful arch 150-165)    165  Painful arch  150-165 Flexion    180                       Extension          105    115     120   145    145 Abduction    180      sacrum    40     T10       T8 Internal Rotation    T8      occiput  35     T2       T4 External Rotation    T4      (blank fields were intentionally left blank)             Cervical AROM: WFL and Limitations from previous neck injury with slight stiffness  Elbow/Wrist AROM: WFL    Strength:  Initial R    R   8/3 UE Strength   MMT /5  Initial   L    L   8/3   3+*   4+ Shoulder Flexion  5   5   3*  4*  Shoulder Abduction  5   5       Shoulder Extension        3*  4+  Shoulder External Rotation  5   5   3+  4+  Shoulder Internal Rotation  5   5   3*   4+ Serratus  4   5   3*   4 Rhomboids  4   4+   4   4+ Lower Trapezius  4   4+   5   5 Biceps  5   5   5   5  Triceps  5   5   (blank fields were intentionally left blank)      Modalities: None  Therapy Rationale: Other: patient injured during treatment and taken to urgent care (see assessment)       Assessment (response to treatment):   Patient injured when she missed 2# ball during ball toss against rebounder.  The ball hit her nail which was ripped off her finger from the nail bed.  Patient was in increased pain and felt nauseous and lightheaded.  Patient was provided ice for her hand and water by treating PT.  PT walked patient over to urgent care for further work up as she did not feel up to driving home.  PT helped fill paperwork out and patient was provided medical care at urgent care.  Incident report was completed for PT clinic.     ASSESSMENT: the patient is a 59 y.o.  female who has been seen in our clinic for 7 visit presenting with R shoulder pain.  Patient with improvements to R shoulder ROM but continues to report painful arch during flexion.  Patient with improving B UE strength but continues to demonstrate deficits in scapular strength and endurance leading to postural deficits.  Patient would like to trial HEP at home for 2 weeks and follow up with PT regarding continuation or discharge.  Patient continues to be progressing towards goals and requires continued Physical Therapy for the following:  Impairments: increased R shoulder pain, increased R arm weakness, impaired postural awareness    Progress towards functional goals:   Goals:  Date (Body Area, Impairment Goal, Functional   Activity, Target Performance) Time Frame Status Date/  Initial   08/09/2014   Patient will demonstrate independence in prescribed HEP with proper form, sets and reps for safe discharge to an independent program.  6W progressing 09/06/14  MJ   08/09/2014   Decrease Shoulder Pain and Disability Index (SPADI) to <=35 to exceed Minimal Detectable Change (MDC) of 10 pts.   6W Progressing - 36 09/06/14  MJ   08/09/2014   Increase AROM shoulder flexion to 165 deg and IR/ER top 65 deg to allow for increased ease with dressing and ADLs pain free.  6W Progressing - painful arch 09/06/14  MJ  08/09/2014   Increase shoulder and scapular strength by 1 grade to be able to maintain proper postural alignment and lift and lower objects pain free.Marland Kitchen  6W progressing 09/06/14  MJ       Patient requires continued skilled care to: increase scapular and shoulder strength and endurance for improved UE function pain free    Plan:  Continue with Plan of Care.  Patient to trial HEP for 2 weeks and following up with PT regarding continuation vs. discharge.    Preston Fleeting, PT, DPT [place] #[id number]    09/06/2014

## 2014-09-07 NOTE — PT/OT Plan of Care (Signed)
Updated Plan of Care IPTC Medicare Provider #: [phone]                Patient Name: MARQUERITE FORSMAN  MRN: [id number]  DOI: Onset of Problem / Injury: 07/10/14 DOS: N/A  SOC: 08/09/2014    Diagnosis:     ICD-10-CM    1. Right shoulder pain M25.511    2. Decreased range of motion of right shoulder M25.611    3. Right arm weakness M62.81        ASSESSMENT: the patient is a 59 y.o. female presenting with R shoulder pain who requires Physical Therapy for the following:  Impairments: increased R shoulder pain, decreased R shoulder ROM, increased R arm weakness, impaired postural awareness    Functional Limitations: Patient states trouble sleeping at night secondary to pain and getting comfortable as she sleeps on the R side but states other factors wake her up at night.  Patient with trouble with ADLs including showering, behind the back tasks or over head tasks.  Patient is not able to carry anything on R side secondary to pain and has started carrying everything in L hand or on L side.  Patient states this discomfort has not kept her from exercising or working as she works through the pain but she is not going as often.      Plan Of Care: Body Mechanics Education, NMR, Proprioceptive Activites, Electrical Stimulation, Instruction in HEP, Ultrasound, Therapeutic Exercise, Soft Tissue/Joint Mobilization GH/scapular grade I-IV and taping    Frequency/Duration: 2 times a week for 6 weeks. Anticipated D/C date: 09/20/14    Goals:  Date (Body Area, Impairment Goal, Functional   Activity, Target Performance) Time Frame Status Date/  Initial   08/09/2014   Patient will demonstrate independence in prescribed HEP with proper form, sets and reps for safe discharge to an independent program.  6W progressing 09/06/14  MJ   08/09/2014   Decrease Shoulder Pain and Disability Index (SPADI) to <=35 to exceed Minimal Detectable Change (MDC) of 10 pts.   6W Progressing - 36 09/06/14  MJ   08/09/2014   Increase AROM shoulder flexion to 165 deg and IR/ER top  65 deg to allow for increased ease with dressing and ADLs pain free.  6W Progressing - painful arch 09/06/14  MJ   08/09/2014   Increase shoulder and scapular strength by 1 grade to be able to maintain proper postural alignment and lift and lower objects pain free.Marland Kitchen  6W progressing 09/06/14  MJ         Signature: Preston Fleeting, PT, DPT [place] #[id number]   Date: 08/09/2014    Signature: Elyse Hsu, MD ____________________________ Date:     Patient Name: LAKEYSHA SLUTSKY  MRN: [id number]      Current/Discharge Status:   Service Dates:   From:  08/09/14  To: 09/06/2014  Visits from [facility]: 7    Objective Status:  Subjective:  Rudie's pain is Increases with movement and is rated a 0/10.  Functional Changes: Patient states no pain upon arrival but states she has difficulty with tasks at home because she feels like her arm is weak.  Patient would like to do trial of HEP for 2 weeks to decide if she could continue to strengthen on her own or feels she needs to continue with PT.         Current Measurements (ROM, Strength, Girth, Outcomes, etc.):   Outcome Measure:    SPADI R Score: 67 (IE)  36 (09/06/14) Optimal 0                           % Pain Score: 37%  (IE)   27 (09/06/14) Optimal 0  Rate of Current Satisfaction 5/10 (IE) 6/10 (09/06/14)     Observation/Posture/Gait/Integumentary:  Observation of posture: Deficits noted: Forward Head and Rounded Shoulders  Palpation: Pain to palpation: anterior shoulder (biceps tendon), lateral shoulder, UT all on R shoulder (IE) no pain with palpation (09/06/14)  Joint Mobility: hypomobile GH direction: PA and Inferior (IE) WFL (09/06/14) Scapula mobility: Abducted End Feel: Firm    Range of Motion: (degrees)  InitialRight  AROM   InitialRight   PROM   seated   Right  AROM  7/18   09/04/14   Right AROM    R  AROM  09/06/14 Shoulder    InitialLeft AROM      135    150    150    165   (painful arch 150-165)    165  Painful arch  150-165 Flexion    180                       Extension          105    115     120   145     145 Abduction    180      sacrum    40     T10       T8 Internal Rotation    T8      occiput   35     T2       T4 External Rotation    T4      (blank fields were intentionally left blank)             Cervical AROM: WFL and Limitations from previous neck injury with slight stiffness  Elbow/Wrist AROM: WFL    Strength:  Initial R    R   8/3 UE Strength   MMT /5  Initial   L    L   8/3   3+*   4+ Shoulder Flexion  5   5   3*  4*  Shoulder Abduction  5   5       Shoulder Extension        3*  4+  Shoulder External Rotation  5   5   3+  4+  Shoulder Internal Rotation  5   5   3*   4+ Serratus  4   5   3*   4 Rhomboids  4   4+   4   4+ Lower Trapezius  4   4+   5   5 Biceps  5   5   5   5  Triceps  5   5   (blank fields were intentionally left blank)      ASSESSMENT: the patient is a 59 y.o. female who has been seen in our clinic for 7 visit presenting with R shoulder pain.  Patient with improvements to R shoulder ROM but continues to report painful arch during flexion.  Patient with improving B UE strength but continues to demonstrate deficits in scapular strength and endurance leading to postural deficits.  Patient would like to trial HEP at home for 2 weeks and follow up with PT regarding continuation or discharge.  Patient continues to be  progressing towards goals and requires continued Physical Therapy for the following:  Impairments: increased R shoulder pain, increased R arm weakness, impaired postural awareness    Patient requires continued skilled care to: increase scapular and shoulder strength and endurance for improved UE function pain free    Plan:  Continue with Plan of Care.  Patient to trial HEP for 2 weeks and following up with PT regarding continuation vs. discharge.    Preston Fleeting, PT, DPT [place] [phone]    09/06/2014        Signature: Daisy Blossom, MD ____________________ Date:     Patient Name: WINDSOR ZIRKELBACH  MRN: [id number]

## 2014-09-12 ENCOUNTER — Ambulatory Visit (INDEPENDENT_AMBULATORY_CARE_PROVIDER_SITE_OTHER): Payer: No Typology Code available for payment source | Admitting: Internal Medicine

## 2014-09-12 ENCOUNTER — Inpatient Hospital Stay: Payer: No Typology Code available for payment source

## 2014-09-12 ENCOUNTER — Encounter (INDEPENDENT_AMBULATORY_CARE_PROVIDER_SITE_OTHER): Payer: Self-pay

## 2014-09-12 VITALS — BP 137/86 | HR 71 | Temp 98.5°F | Resp 14 | Ht 67.0 in | Wt 225.0 lb

## 2014-09-12 DIAGNOSIS — S6992XA Unspecified injury of left wrist, hand and finger(s), initial encounter: Secondary | ICD-10-CM

## 2014-09-12 MED ORDER — IBUPROFEN 800 MG PO TABS
800.0000 mg | ORAL_TABLET | Freq: Three times a day (TID) | ORAL | Status: AC | PRN
Start: 2014-09-12 — End: ?

## 2014-09-12 NOTE — Progress Notes (Signed)
59 year old female presents for wound check to left 2nd digit.  The patient had a laceration a few days ago which was repaired with dermabond.  The patient states the pain is under control with ibuprofen, and that she has not had any discharge from the wound.  No fever.  She does not need to follow up unless she has any questions.

## 2014-09-14 ENCOUNTER — Inpatient Hospital Stay: Payer: No Typology Code available for payment source

## 2016-02-04 DIAGNOSIS — Z86711 Personal history of pulmonary embolism: Secondary | ICD-10-CM

## 2016-02-04 HISTORY — DX: Personal history of pulmonary embolism: Z86.711

## 2016-02-18 ENCOUNTER — Emergency Department (HOSPITAL_COMMUNITY)
Admission: EM | Admit: 2016-02-18 | Discharge: 2016-02-18 | Disposition: A | Discharge status: Home / Self Care | Payer: Managed Care, Other (non HMO) | Attending: Emergency Medicine | Admitting: Emergency Medicine

## 2016-02-18 ENCOUNTER — Encounter (HOSPITAL_COMMUNITY): Payer: Self-pay | Admitting: Oncology

## 2016-02-18 DIAGNOSIS — I10 Essential (primary) hypertension: Secondary | ICD-10-CM | POA: Insufficient documentation

## 2016-02-18 DIAGNOSIS — R112 Nausea with vomiting, unspecified: Secondary | ICD-10-CM | POA: Diagnosis not present

## 2016-02-18 DIAGNOSIS — Z79899 Other long term (current) drug therapy: Secondary | ICD-10-CM | POA: Diagnosis not present

## 2016-02-18 DIAGNOSIS — J45909 Unspecified asthma, uncomplicated: Secondary | ICD-10-CM | POA: Diagnosis not present

## 2016-02-18 DIAGNOSIS — R197 Diarrhea, unspecified: Secondary | ICD-10-CM | POA: Insufficient documentation

## 2016-02-18 HISTORY — DX: Unspecified cirrhosis of liver: K74.60

## 2016-02-18 HISTORY — DX: Essential (primary) hypertension: I10

## 2016-02-18 HISTORY — DX: Unspecified asthma, uncomplicated: J45.909

## 2016-02-18 HISTORY — DX: Nonalcoholic steatohepatitis (NASH): K75.81

## 2016-02-18 HISTORY — DX: Diverticulosis of intestine, part unspecified, without perforation or abscess without bleeding: K57.90

## 2016-02-18 LAB — COMPREHENSIVE METABOLIC PANEL
ALBUMIN: 3.8 g/dL (ref 3.5–5.0)
ALK PHOS: 56 U/L (ref 38–126)
ALT: 37 U/L (ref 14–54)
ANION GAP: 10 (ref 5–15)
AST: 20 U/L (ref 15–41)
BILIRUBIN TOTAL: 0.3 mg/dL (ref 0.3–1.2)
BUN: 21 mg/dL — ABNORMAL HIGH (ref 6–20)
CALCIUM: 8.9 mg/dL (ref 8.9–10.3)
CO2: 27 mmol/L (ref 22–32)
Chloride: 96 mmol/L — ABNORMAL LOW (ref 101–111)
Creatinine, Ser: 0.97 mg/dL (ref 0.44–1.00)
GFR calc non Af Amer: >60 mL/min (ref >60)
GLUCOSE: 99 mg/dL (ref 65–99)
Potassium: 3 mmol/L — ABNORMAL LOW (ref 3.5–5.1)
SODIUM: 133 mmol/L — AB (ref 135–145)
TOTAL PROTEIN: 7.2 g/dL (ref 6.5–8.1)

## 2016-02-18 LAB — CBC
HEMATOCRIT: 41.5 % (ref 36.0–46.0)
HEMOGLOBIN: 14.7 g/dL (ref 12.0–15.0)
MCH: 30.8 pg (ref 26.0–34.0)
MCHC: 35.4 g/dL (ref 30.0–36.0)
MCV: 86.8 fL (ref 78.0–100.0)
Platelets: 322 10*3/uL (ref 150–400)
RBC: 4.78 MIL/uL (ref 3.87–5.11)
RDW: 13.6 % (ref 11.5–15.5)
WBC: 11.7 10*3/uL — ABNORMAL HIGH (ref 4.0–10.5)

## 2016-02-18 LAB — URINALYSIS, ROUTINE W REFLEX MICROSCOPIC
BILIRUBIN URINE: NEGATIVE
GLUCOSE, UA: NEGATIVE mg/dL
HGB URINE DIPSTICK: NEGATIVE
KETONES UR: NEGATIVE mg/dL
NITRITE: NEGATIVE
PROTEIN: NEGATIVE mg/dL
Specific Gravity, Urine: 1.019 (ref 1.005–1.030)
pH: 6 (ref 5.0–8.0)

## 2016-02-18 LAB — LIPASE, BLOOD: Lipase: 51 U/L (ref 11–51)

## 2016-02-18 MED ORDER — POTASSIUM CHLORIDE CRYS ER 20 MEQ PO TBCR
40.0000 meq | EXTENDED_RELEASE_TABLET | Freq: Once | ORAL | Status: AC
  Administered 2016-02-18: 40 meq via ORAL
  Filled 2016-02-18: qty 2

## 2016-02-18 MED ORDER — METOCLOPRAMIDE HCL 5 MG/ML IJ SOLN
10.0000 mg | Freq: Once | INTRAMUSCULAR | Status: AC
  Administered 2016-02-18: 10 mg via INTRAVENOUS
  Filled 2016-02-18: qty 2

## 2016-02-18 MED ORDER — SODIUM CHLORIDE 0.9 % IV BOLUS (SEPSIS)
1000.0000 mL | Freq: Once | INTRAVENOUS | Status: AC
  Administered 2016-02-18: 1000 mL via INTRAVENOUS

## 2016-02-18 MED ORDER — MAGNESIUM SULFATE 2 GM/50ML IV SOLN
2.0000 g | Freq: Once | INTRAVENOUS | Status: AC
  Administered 2016-02-18: 2 g via INTRAVENOUS
  Filled 2016-02-18: qty 50

## 2016-02-18 MED ORDER — LOPERAMIDE HCL 2 MG PO CAPS: 2.0000 mg | ORAL_CAPSULE | Freq: Four times a day (QID) | ORAL | 0 refills | Status: DC | PRN

## 2016-02-18 MED ORDER — ONDANSETRON 4 MG PO TBDP: 4.0000 mg | ORAL_TABLET | Freq: Three times a day (TID) | ORAL | 0 refills | Status: DC | PRN

## 2016-02-18 MED FILL — ONDANSETRON ODT 4 MG TABLET: 4 | 7 days supply | Qty: 20 | Fill #0

## 2016-02-18 NOTE — ED Provider Notes (Signed)
  Physical Exam  BP 135/93 (BP Location: Left Arm)   Pulse 64   Temp 98.8 F (37.1 C) (Oral)   Resp 15   Ht 5' 7.5" (1.715 m)   Wt 225 lb 9.6 oz (102.3 kg)   SpO2 97%   BMI 34.81 kg/m   Physical Exam  ED Course  Procedures  MDM  Pt with abd pain, n/v. Pt reassessed. Pt's VSS and WNL. Pt's cap refill < 3 seconds. Pt has been hydrated in the ER and now passed po challenge. We will discharge with antiemetic. Strict ER return precautions have been discussed and pt will return if he is unable to tolerate fluids and symptoms are getting worse.       Varney Biles, MD 02/18/16 1007

## 2016-02-18 NOTE — ED Notes (Signed)
Patient is A & O x4.  She understood discharge instructions. 

## 2016-02-18 NOTE — ED Triage Notes (Signed)
Per pt she has had N/V/D and abdominal pain since yesterday.  Per pt she is unable to keep even water down.  Pt currently on bactrim for bronchitis.  Pt rates her pain 6/10, dull in nature.

## 2016-02-18 NOTE — ED Notes (Signed)
Writer called for blood work, no response.

## 2016-02-18 NOTE — ED Provider Notes (Signed)
[facility] DEPT Provider Note   CSN: [id number] Arrival date & time: 02/18/16  0030     History   Chief Complaint Chief Complaint  Patient presents with  . Abdominal Pain    HPI Isabel Garcia is a 61 y.o. female.  Patient presents with nausea vomiting diarrhea and left upper quadrant abdominal discomfort. Patient was treated with Bactrim recently for bronchitis. Patient is on day 4 of antibiotics. No history of C. difficile. Intermittent symptoms. No fevers.    Abdominal Pain   Associated symptoms include diarrhea, nausea and vomiting. Pertinent negatives include fever, dysuria, hematuria and arthralgias.    Past Medical History:  Diagnosis Date  . Asthma   . Diverticulosis   . Hypertension   . Liver cirrhosis secondary to NASH (nonalcoholic steatohepatitis) ([place])     There are no active problems to display for this patient.   Past Surgical History:  Procedure Laterality Date  . ABDOMINAL HYSTERECTOMY    . KNEE ARTHROSCOPY Right     OB History    No data available       Home Medications    Prior to Admission medications   Medication Sig Start Date End Date Taking? Authorizing Provider  albuterol (PROVENTIL HFA;VENTOLIN HFA) 108 (90 Base) MCG/ACT inhaler Inhale 1-2 puffs into the lungs every 6 (six) hours as needed for wheezing or shortness of breath.   Yes Historical Provider, MD  prednisoLONE (ORAPRED) 15 MG/5ML solution Take 30 mg by mouth daily before breakfast.   Yes Historical Provider, MD  sulfamethoxazole-trimethoprim (BACTRIM DS,SEPTRA DS) 800-160 MG tablet Take 1 tablet by mouth 2 (two) times daily. 02/12/16 02/22/16 Yes Historical Provider, MD    Family History No family history on file.  Social History Social History  Substance Use Topics  . Smoking status: Never Smoker  . Smokeless tobacco: Never Used  . Alcohol use Yes     Comment: wine occasionaly     Allergies   Patient has no known allergies.   Review of Systems Review of  Systems  Constitutional: Positive for appetite change. Negative for chills and fever.  HENT: Negative for ear pain and sore throat.   Eyes: Negative for pain and visual disturbance.  Respiratory: Negative for cough and shortness of breath.   Cardiovascular: Negative for chest pain and palpitations.  Gastrointestinal: Positive for abdominal pain, diarrhea, nausea and vomiting.  Genitourinary: Negative for dysuria and hematuria.  Musculoskeletal: Negative for arthralgias and back pain.  Skin: Negative for color change and rash.  Neurological: Negative for seizures and syncope.  All other systems reviewed and are negative.    Physical Exam Updated Vital Signs BP 113/79 (BP Location: Left Arm)   Pulse (!) 59   Temp 98.8 F (37.1 C) (Oral)   Resp 20   Ht 5' 7.5" (1.715 m)   Wt 225 lb 9.6 oz (102.3 kg)   SpO2 95%   BMI 34.81 kg/m   Physical Exam  Constitutional: She appears well-developed and well-nourished. No distress.  HENT:  Head: Normocephalic and atraumatic.  Dry mucous membranes  Eyes: Conjunctivae are normal.  Neck: Neck supple.  Cardiovascular: Normal rate and regular rhythm.   No murmur heard. Pulmonary/Chest: Effort normal and breath sounds normal. No respiratory distress.  Abdominal: Soft. There is tenderness (mild left upper quadrant).  Musculoskeletal: She exhibits no edema.  Neurological: She is alert.  Skin: Skin is warm and dry.  Psychiatric: She has a normal mood and affect.  Nursing note and vitals reviewed.  ED Treatments / Results  Labs (all labs ordered are listed, but only abnormal results are displayed) Labs Reviewed  COMPREHENSIVE METABOLIC PANEL - Abnormal; Notable for the following:       Result Value   Sodium 133 (*)    Potassium 3.0 (*)    Chloride 96 (*)    BUN 21 (*)    All other components within normal limits  CBC - Abnormal; Notable for the following:    WBC 11.7 (*)    All other components within normal limits  URINALYSIS,  ROUTINE W REFLEX MICROSCOPIC - Abnormal; Notable for the following:    Leukocytes, UA MODERATE (*)    Bacteria, UA RARE (*)    Squamous Epithelial / LPF 0-5 (*)    All other components within normal limits  C DIFFICILE QUICK SCREEN W PCR REFLEX  LIPASE, BLOOD    EKG  EKG Interpretation None       Radiology No results found.  Procedures Procedures (including critical care time)  Medications Ordered in ED Medications  sodium chloride 0.9 % bolus 1,000 mL (1,000 mLs Intravenous New Bag/Given 02/18/16 0600)  potassium chloride SA (K-DUR,KLOR-CON) CR tablet 40 mEq (40 mEq Oral Given 02/18/16 0622)  metoCLOPramide (REGLAN) injection 10 mg (10 mg Intravenous Given 02/18/16 0622)     Initial Impression / Assessment and Plan / ED Course  I have reviewed the triage vital signs and the nursing notes.  Pertinent labs & imaging results that were available during my care of the patient were reviewed by me and considered in my medical decision making (see chart for details).  Clinical Course    Patient presents with recurrent diarrhea left upper quadrant abdominal pain. Concern clinically for Bactrim related. Also considered diverticulitis however mild pain left upper quadrant so less likely. Plan to stop Bactrim as bronchitis likely viral. If patient improves we'll hold on CT scan. IV fluid bolus.  Results and differential diagnosis were discussed with the patient/parent/guardian. Xrays were independently reviewed by myself.  Close follow up outpatient was discussed, comfortable with the plan.   Medications  sodium chloride 0.9 % bolus 1,000 mL (1,000 mLs Intravenous New Bag/Given 02/18/16 0600)  potassium chloride SA (K-DUR,KLOR-CON) CR tablet 40 mEq (40 mEq Oral Given 02/18/16 0622)  metoCLOPramide (REGLAN) injection 10 mg (10 mg Intravenous Given 02/18/16 0622)    Vitals:   02/18/16 0042 02/18/16 0043 02/18/16 0257 02/18/16 0521  BP: 133/88  120/88 113/79  Pulse: 75  84 (!) 59    Resp: 20  19 20   Temp: 98.7 F (37.1 C)   98.8 F (37.1 C)  TempSrc: Oral   Oral  SpO2: 95%  96% 95%  Weight:  225 lb 9.6 oz (102.3 kg)    Height:  5' 7.5" (1.715 m)      Final diagnoses:  Nausea vomiting and diarrhea    Final Clinical Impressions(s) / ED Diagnoses   Final diagnoses:  Nausea vomiting and diarrhea    New Prescriptions New Prescriptions   No medications on file     Elnora Morrison, MD 02/18/16 [phone]

## 2016-11-25 ENCOUNTER — Encounter: Payer: Self-pay | Admitting: Rehabilitative and Restorative Service Providers"

## 2016-11-25 ENCOUNTER — Inpatient Hospital Stay: Payer: 59 | Attending: Physician Assistant | Admitting: Rehabilitative and Restorative Service Providers"

## 2016-11-25 VITALS — BP 121/68 | HR 67

## 2016-11-25 DIAGNOSIS — M545 Low back pain, unspecified: Secondary | ICD-10-CM

## 2016-11-25 DIAGNOSIS — M25571 Pain in right ankle and joints of right foot: Secondary | ICD-10-CM | POA: Insufficient documentation

## 2016-11-25 NOTE — Progress Notes (Signed)
Name: JESSACA PHILIPPI Age: 61 y.o.   Referring Physician: Roger Kill, PA   Date of Injury: 11/26/2014  Date Care Plan Established/Reviewed: 11/25/2016  Date Treatment Started: 11/25/2016  Visit Count: 1   Diagnosis:   1. Left-sided low back pain without sciatica, unspecified chronicity    2. Pain in joint, ankle and foot, right        Subjective     History of Present Illness   Mechanism of injury: Extensive Health History x 3 years.R shoulder [facility] x 12, 2 1/2 yrs ago.R Achilles pain x 2 yrs.Cast x 2 mos,boot,stem cell injections x 2 months.Vein ablasions 1 month ago: R calf,L thigh.  History of Present Illness: Left>right low back and hip pain x 2 yrs.Compensatory gait with R plantar fascitis x 7 yrs,calcaneal debridement,achilles tendonitis R,B LE venous insufficiency.Knee high compression garments with prolonged walking and standing with full time job.PT for R AT and PF complete 4 months ago.[facility] today left L/S per patient.X-rays,MRI,bone scan + SI tear per patient.PT script to address low back dysfunction and cold laser for AT.  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    Shoulders  Rounded.    Thoracic Spine  Hyperkyphosis.    Lumbar Spine   Flattened.     Pelvis   Pelvis (Left): Elevated.   Pelvis (Right): Depressed.     Knee   Genu varus.     Functional Strength   Sit to Stand: UE assist  Squat: unable  Step-up: 4 inches  Heel Raises      Bilateral: minimal and hesitant B    Balance   Left   Eyes Open (Left)(sec)      Trial 1: 7 seconds  Right   Eyes Open (Right)(sec)      Trial 1: 20 seconds    Gait   Left Side: decreased hip extension decreased knee flexion with swing phase circumduction  Right Side: decreased hip extension decreased knee flexion with swing phase circumduction decreased toe off    Integumentary   no wound, lesion or rash noted    Range of Motion     AROM: Lumbar Spine   11/25/16    Lumbar Flexion 60    Lumbar Extension 25    (blank fields were intentionally left blank)    11/25/16   Left AROM    Left PROM   Lumbar/Hip 11/25/16   Right AROM    Right PROM   50  Lumbar Rotation 55       WFL   Hip Flexion  WFL         Hip Extension           Hip Abduction           Hip Adduction           Hip IR           Hip ER       (blank fields were intentionally left blank)  L/S SB R15,L 5  Right knee TKE -10,flexion 105 (scope 10 yrs ago)  Left knee TKE 0,flexion 120        Strength     11/25/16   Left Strength  Hip  MMT 11/25/16   Right   4+  Hip Flexion 5-    NT  Hip Extension NT    4-  Hip Abduction 5-    4+  Hip Adduction  [facility]     Hip IR       Hip ER       Quadriceps       Hamstrings     (blank fields were intentionally left blank)    11/25/16   Left Strength  Knee  MMT 11/25/16   Right   4+  Knee Flexion 5-    4+  Knee Extension 5-    (blank fields were intentionally left blank)    11/25/16   Left Strength  Ankle/Foot  MMT 11/25/16   Right   4+  Ankle Dorsiflexion  WFL     Ankle Plantarflexion       Ankle Inversion        Ankle Eversion     (blank fields were intentionally left blank)  Lower abdominals 4-/5  Left Hip ER 4+/5,IR WFL    Palpation Left piriformis,gluteal,QL    Flexibility HS Left -30, Right -25  Left ASIS elevated left    Tests       Thoracic   Negative slump.   Lumbar/Pelvic Girdle/Sacrum   Positive pelvic asymmetry.  Negative slump.    Left Hip   Positive FABER.     Neurological Testing     Sensation     Lumbar   Left   Intact: light touch    Right   Intact: light touch      BP: 121/68 Heart Rate: 67    Treatment     Therapeutic Exercises   Justification: To improve: To develop  Flexibility/ROM, Stabilization  Strength  And endurance.  Instructed HEP:SKC hip flexor,faber,active HS stretch,supine PPT,gluteal and hip add isom.Patient demonstration and handout provided.    Neuromuscular Re-Education   Justification:        Therapeutic Activity   Justification: For improvement in the completion of ADL's and functional activities.  Dynamic activities to improve functional performance, direct (one-on-one) with the patient   Importance of HEP compliance with multiple co-morbidities.Modification of work standing and walking requirements.Use of ice for pain management.Realistic goals with extensive health history and multiple joint pain.    Modalities   Skin check completed    Declined use of ice       ---      ---   Total Time   Timed Minutes  25 minutes   Untimed Minutes  20 minutes   Total Time  45 minutes        Assessment   Mishelle is a 61 y.o. female presenting with left SI,lumbar and right achilles pain who requires  Physical Therapy for the following:  Impairments: Decreased mobility L/S extension,B SB and LTR.Tight B HS, left hip adductors,G/S.R achilles tendonitis,Left SI dysfunction.Right knee -10 TKE,105 flexion.Weak lower abdominals and hips.    Pain located: Left QL,gluteus medius,maximus,SI,R AT    Clinical presentation: stable   Barriers to therapy: Patient's age 67  Time since onset of  injury/illness/exacerbation 2 yrs  Past surgical history R knee scope 10 yrs ago  Mechanism of injury/illness/exacerbation work requirements with prolonged standing and walking.Compensatory gait with multiple joint pain.  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  Prognosis: good  Plan   Visits per week: 1  Number of Sessions: 12  Direct One on One  16109: Therapeutic Exercise: To Develop Strength and Endurance, ROM and Flexibility  [id number]: Gait Training  60454: Neuromuscular Reeducation  97140: Manual Therapy techniques (mobilization, manipulation, manual traction)  97530: Therapeutic Activities: Dynamic activities to improve functional performance  Supervised Modalities  97010: Thermal modalities: hot/cold packs  09811: Electrical stimulation  Next treatment: check B hip ext strength.SI METS,leg length.Ice/ES left L/S.Gait training,stair navigation for safety.Instruct sit to stand and lifting body mechanics.      Goals    Goal 1:  Increase L/S SB 15,LTR to 55 to assist with transfers in and out of car.   Sessions:  12      Goal 2:  Increase left hip Abd,ER strength to 4+/5 to put socks and shoes on without L/S symptoms>5/10.   Sessions:  12      Goal 3:  Increase B HS flexibility to -20 to reach light items on floor.   Sessions:  12      Goal 4:  Increase strength lower abdominals and hip extension to 4+/5 to ascend 2 flights of stairs to apartment safely.    Sessions:  12          Goal 5:   Improve Foto Score of 36 to exceed predicted FOTO score by >17 points,  for safe return to independent living.   Sessions:  12                         Barnet Glasgow, PT

## 2016-11-26 DIAGNOSIS — M545 Low back pain, unspecified: Secondary | ICD-10-CM | POA: Insufficient documentation

## 2016-11-26 DIAGNOSIS — M25571 Pain in right ankle and joints of right foot: Secondary | ICD-10-CM | POA: Insufficient documentation

## 2016-12-02 ENCOUNTER — Inpatient Hospital Stay: Payer: 59 | Attending: Physician Assistant | Admitting: Rehabilitative and Restorative Service Providers"

## 2016-12-02 DIAGNOSIS — M25571 Pain in right ankle and joints of right foot: Secondary | ICD-10-CM | POA: Insufficient documentation

## 2016-12-02 DIAGNOSIS — M545 Low back pain, unspecified: Secondary | ICD-10-CM

## 2016-12-02 NOTE — PT/OT Therapy Note (Signed)
Name: Monica Romero Age: 61 y.o.   Referring Physician: Roger Kill, PA   Date of Injury: 11/26/2014  Date Care Plan Established/Reviewed: 11/25/2016  Date Treatment Started: 11/25/2016  Visit Count: 2   Diagnosis:   1. Left-sided low back pain without sciatica, unspecified chronicity    2. Pain in joint, ankle and foot, right        Subjective     Pain   Compliant with HEP 1 x/day and feel it has helped reduce left low back pain.Notice left groin and right inner ankle tenderness after prolonged standing at work today.Standing and walking tolerance 30 min.Injection in left low back 1 week ago has helped as well.    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  -Standing G/S stretch with balance board, 3 x 30 sec ea with upright posture  -Supine PPT with pilates ring, 5 sec hold, 1 x 7  -Supine PPT with small ball hip ADD, bridge, 5 sec hold, 1 x 10    -Supine L hip flexor stretch off table, 3 x 30 sec with the stick STM to proximal QS  -Supine left hip adductor,IR stretch, 5 x 30 sec  -SI METS for left posterior inomminate    Neuromuscular Re-Education   Justification: For activation and/or inhibition of target muscle,- for movement, balance, coordination, kinesthetic sense, posture, and/ or proprioception for sitting and/ or standing activities      Manual Therapy   Justification: To improve joint mobility, soft tissue mobility, and reduce trigger points  -SI METS for left posterior inomminate, Supine, 1 x 5, 5 sec hold B  The stick STM left QS,ITB,HS,G/S with proximal pressure    Therapeutic Activity   Justification: For improvement in the completion of ADL's and functional activities.  Dynamic activities to improve functional performance,  direct (one-on-one) with the patient   Educated importance of hourly change of position with prolonged standing and walking during work hours as Estate manager/land agent.Use of ice and possible stick self STM to manage multiple joint and muscle pain.    Modalities   Skin check completed    Electrical Stimulation with Ice: Premod 15 min. Location Left ant groin,Left L/S  Position Supine 90/90 and None  Therapy Rationale: Decrease Pain       ---      ---   Total Time   Timed Minutes  45 minutes   Untimed Minutes  15 minutes   Total Time  60 minutes        Assessment   Compensatory gait with right plantar fascitis and achilles tendonitis x 7 years possible trigger for left>right low back pain over last 2 years.Tender left hip flexors and proximal QS with self stretching.  Plan   Cont POC;Assess ice/ES for pain management.Add SL and prone hip strengthening to tolerance.Assess SI for METS.Instruct transfer body mechanics.      Goals    Goal 1:  Increase L/S SB 15,LTR to 55 to assist with transfers in and out of car.   Sessions:  12      Goal 2:  Increase left hip Abd,ER strength to 4+/5 to put socks and shoes on  without L/S symptoms>5/10.   Sessions:  12      Goal 3:  Increase B HS flexibility to -20 to reach light items on floor.   Sessions:  12      Goal 4:  Increase strength lower abdominals and hip extension to 4+/5 to ascend 2 flights of stairs to apartment safely.   Sessions:  12          Goal 5:   Improve Foto Score of 36 to exceed predicted FOTO score by >17 points,  for safe return to independent living.   Sessions:  12                         Barnet Glasgow, PT

## 2016-12-09 ENCOUNTER — Inpatient Hospital Stay
Payer: Commercial Managed Care - PPO | Attending: Physician Assistant | Admitting: Rehabilitative and Restorative Service Providers"

## 2016-12-09 DIAGNOSIS — M545 Low back pain, unspecified: Secondary | ICD-10-CM

## 2016-12-09 DIAGNOSIS — M25571 Pain in right ankle and joints of right foot: Secondary | ICD-10-CM | POA: Insufficient documentation

## 2016-12-10 NOTE — PT/OT Therapy Note (Signed)
Name: Monica Romero Age: 61 y.o.   Referring Physician: Roger Kill, PA   Date of Injury: 11/26/2014  Date Care Plan Established/Reviewed: 11/25/2016  Date Treatment Started: 11/25/2016  Visit Count: 3   Diagnosis:   1. Left-sided low back pain without sciatica, unspecified chronicity    2. Pain in joint, ankle and foot, right        Subjective     Pain   Drove to [place] this weekend for college reunion.4 hour drive with residual L/S and left hip stiffness.Scratches on left calf after falling into bushes during brisk walking on uneven ground.Putting tea tree oil on skin to resolve.Not the first time to feel unsteady while walking quickly.    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  -SciFit bike with LE only x 5 min level 1,active warm up with subjective intake  -Standing G/S stretch with balance board, 3 x 30 sec ea with upright posture  -Supine PPT with pilates ring, 5 sec hold, 1 x 7  -Supine PPT with small ball hip ADD, bridge, 5 sec hold, 1 x 10    -Supine L hip flexor stretch off table, 3 x 30 sec with the stick STM to proximal QS  -Supine left hip adductor,IR stretch, 5 x 30 sec    -R SL hooklying hip Abd L with TrA, 1 x 10    Neuromuscular Re-Education   Justification: For activation and/or inhibition of target muscle,- for movement, balance, coordination, kinesthetic sense, posture, and/ or proprioception for sitting and/ or standing activities      Manual Therapy   Justification: To improve joint mobility, soft tissue mobility, and reduce trigger points  -SI METS for left anterior inomminate, Supine, 1 x 5, 5 sec hold B  The stick STM left QS,ITB,HS,G/S with proximal pressure    Therapeutic Activity   Justification: For  improvement in the completion of ADL's and functional activities.  Dynamic activities to improve functional performance, direct (one-on-one) with the patient     Modalities   Skin check completed    Electrical Stimulation with Ice: Premod 15 min. Location Left ant groin,Left L/S  Position Supine 90/90 and None  Therapy Rationale: Decrease Pain       ---      ---   Total Time   Timed Minutes  30 minutes   Untimed Minutes  15 minutes   Total Time  45 minutes        Assessment   Able to tolerate recumbent bike without multiple joint pain.Weak lower abdominals and gluteus medius noted with exercises.Circumduction gait with muscle fatigue.  Plan   Cont POC; Add prone hip extension and hip ER isometrics.Assess SI and L hip symptoms to advance HEP.      Goals    Goal 1:  Increase L/S SB 15,LTR to 55 to assist with transfers in and out of car.   Sessions:  12      Goal 2:  Increase left hip Abd,ER strength to 4+/5 to put socks and shoes on without L/S symptoms>5/10.   Sessions:  12      Goal 3:  Increase B HS  flexibility to -20 to reach light items on floor.   Sessions:  12      Goal 4:  Increase strength lower abdominals and hip extension to 4+/5 to ascend 2 flights of stairs to apartment safely.   Sessions:  12          Goal 5:   Improve Foto Score of 36 to exceed predicted FOTO score by >17 points,  for safe return to independent living.   Sessions:  12                         Barnet Glasgow, PT

## 2016-12-16 ENCOUNTER — Inpatient Hospital Stay
Payer: Commercial Managed Care - PPO | Attending: Physician Assistant | Admitting: Rehabilitative and Restorative Service Providers"

## 2016-12-16 DIAGNOSIS — M545 Low back pain, unspecified: Secondary | ICD-10-CM

## 2016-12-16 DIAGNOSIS — M25571 Pain in right ankle and joints of right foot: Secondary | ICD-10-CM

## 2016-12-16 NOTE — PT/OT Therapy Note (Signed)
Name: Monica Romero Age: 61 y.o.   Referring Physician: Roger Kill, PA   Date of Injury: 11/26/2014  Date Care Plan Established/Reviewed: 11/25/2016  Date Treatment Started: 11/25/2016  Visit Count: 4   Diagnosis:   1. Left-sided low back pain without sciatica, unspecified chronicity    2. Pain in joint, ankle and foot, right        Subjective     Pain   Multiple MD appointments today with mild fatigue.Blood work and abdominal CT scan and contrast this am.Dr Protopapas this afternoon to discuss L/S inflammation and plan for treatment.Without change in medication due to blood work results.Marked R HS cramps 3 days ago without cause.Gentle stretching,hydration,and self massage with rolling pin helpful.Feel unstable with left groin pain and wonder if should use cane while walking.    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  -SciFit bike with LE/UE x 5 min level 1,active warm up with subjective intake  -Standing G/S stretch with balance board, 3 x 30 sec ea with upright posture  -Supine PPT with pilates ring, 5 sec hold, 1 x 10  -Supine PPT with small ball hip ADD, bridge, 5 sec hold, 1 x 15    -Supine L hip flexor stretch off table, 3 x 30 sec with the stick STM to proximal QS  -Supine left hip adductor,IR stretch, 5 x 30 sec    -R SL hooklying hip Abd L with TrA, 1 x 10(not today with pain)    Neuromuscular Re-Education   Justification: For activation and/or inhibition of target muscle,- for movement, balance, coordination, kinesthetic sense, posture, and/ or proprioception for sitting and/ or  standing activities      Manual Therapy   Justification: To improve joint mobility, soft tissue mobility, and reduce trigger points  Defer MT today with instruction self management below  -SI METS for left anterior inomminate, Supine, 1 x 5, 5 sec hold B  The stick STM left QS,ITB,HS,G/S with proximal pressure    Therapeutic Activity   Justification: For improvement in the completion of ADL's and functional activities.  Dynamic activities to improve functional performance, direct (one-on-one) with the patient   Brief gait training with cane on R, Hall x 2 for safe step through non antalgic gait.Avoid standing> 1 hr at work.Use of ice,self STM,gentle stretching with R achilles tendon symptoms.    Modalities   Skin check completed    Electrical Stimulation with Ice: Premod 15 min. Location Left ant groin,Left L/S  Position Supine 90/90 and None  Therapy Rationale: Decrease Pain       ---      ---   Total Time   Timed Minutes  45 minutes   Untimed Minutes  15 minutes   Total Time  60 minutes        Assessment   Progressive left anterior groin pain 2 weeks post most recent epidural with Dr Donnella Sham 11/06/16.Antalgic L LE circumduted gait would benefit from cane on  R to improve balance with walking outside of home.Decreased R L/S LTR and HS flexibility limits transfers in and out of car.Able to tolerate recumbent bike and could use stationary bike in church to improve LE strength and endurance.  Plan   Cont POC;2 nd vein ablations on R in [facility] 12/19/16.1 vein ablation on left without complications.Stairs in building to assess safety with cane and railing.Check L/S SB mobility for goal.      Goals    Goal 1:  Increase L/S SB 15,LTR to 55 to assist with transfers in and out of car.   Sessions:  12      Goal 2:  Increase left hip Abd,ER strength to 4+/5 to put socks and shoes on without L/S symptoms>5/10.   Sessions:  12      Goal 3:  Increase B HS flexibility to -20 to reach light items on  floor.(11/13/18KW)R met.   Sessions:  12   Progression:  progressing      Goal 4:  Increase strength lower abdominals and hip extension to 4+/5 to ascend 2 flights of stairs to apartment safely.   Sessions:  12          Goal 5:   Improve Foto Score of 36 to exceed predicted FOTO score by >17 points,  for safe return to independent living.   Sessions:  12                         Barnet Glasgow, PT,CLT

## 2016-12-23 ENCOUNTER — Inpatient Hospital Stay
Payer: Commercial Managed Care - PPO | Attending: Physician Assistant | Admitting: Rehabilitative and Restorative Service Providers"

## 2016-12-23 DIAGNOSIS — M545 Low back pain, unspecified: Secondary | ICD-10-CM

## 2016-12-23 DIAGNOSIS — M25571 Pain in right ankle and joints of right foot: Secondary | ICD-10-CM

## 2016-12-24 NOTE — PT/OT Therapy Note (Signed)
Name: Monica Romero Age: 61 y.o.   Referring Physician: Roger Kill, PA   Date of Injury: 11/26/2014  Date Care Plan Established/Reviewed: 11/25/2016  Date Treatment Started: 11/25/2016  Visit Count: 5   Diagnosis:   1. Left-sided low back pain without sciatica, unspecified chronicity    2. Pain in joint, ankle and foot, right        Subjective     Pain   Yesterday was not a good day for left leg.On feet standing and walking at work for 6 hours.Thigh high compression garments on both legs helped a little.To schedule follow up with Dr Brayton El next week to discuss results of CT scan and blood work.2nd vein ablation R leg 4 days ago with compliant thigh high compression garment wear.    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  -SciFit bike with LE/UE x 5 min level 1,active warm up with subjective intake(not today with stairs in building)  -Standing G/S stretch with balance board, 3 x 30 sec ea with upright posture  -Supine PPT with pilates ring, 5 sec hold, 1 x 10  -Supine PPT with small ball hip ADD, bridge, 5 sec hold, 1 x 15    -Supine L hip flexor stretch off table, 3 x 30 sec with the stick STM to proximal QS  -Supine left hip adductor,IR stretch, 5 x 30 sec    -R SL hooklying hip Abd L with TrA, 1 x 10(not today with pain)    Stairs in building: 2 flights reciprocally with railing (TE chg)    Neuromuscular Re-Education   Justification: For activation and/or inhibition of target muscle,- for movement, balance, coordination, kinesthetic sense, posture, and/ or proprioception for sitting and/ or standing activities      Manual Therapy   Justification: To improve joint mobility, soft tissue mobility, and reduce trigger points  Defer  MT today with instruction self management below  -SI METS for left anterior inomminate, Supine, 1 x 5, 5 sec hold B  The stick STM left QS,ITB,HS,G/S with proximal pressure    Therapeutic Activity   Justification: For improvement in the completion of ADL's and functional activities.  Dynamic activities to improve functional performance, direct (one-on-one) with the patient   Brief gait training with cane on R, Hall x 2 for safe step through non antalgic gait.Avoid standing> 1 hr at work.Use of ice,self STM,gentle stretching with R achilles tendon symptoms.    Modalities   Skin check completed    Electrical Stimulation with Ice: Premod 15 min. Location Left lateral hip,Left L/S  Position Supine 90/90 and None  Therapy Rationale: Decrease Pain       ---      ---   Total Time   Timed Minutes  30 minutes   Untimed Minutes  15 minutes   Total Time  45 minutes        Assessment   Progressive left anterior groin pain 3 weeks post most recent epidural with Dr Donnella Sham 11/06/16.Able to navigate stairs safely with visual and verbal prompts.Ascends stairs with L/S flexion and T/S left SB list with railing assist.Core control with upright posture helpful for L  LE while ascending stairs.Able to descend stairs reciprocally safely with railing.Fatigue following exercises with antalgic circumduction L LE after treatment.  Plan   Cont POC;Check SLS and L/S SB AROM for goals.Possible 2nd epidural or gluteal pain injection in 2 weeks.      Goals    Goal 1:  Increase L/S SB 15,LTR to 55 to assist with transfers in and out of car.   Sessions:  12      Goal 2:  Increase left hip Abd,ER strength to 4+/5 to put socks and shoes on without L/S symptoms>5/10.   Sessions:  12      Goal 3:  Increase B HS flexibility to -20 to reach light items on floor.(11/13/18KW)R met.   Sessions:  12   Progression:  progressing      Goal 4:  Increase strength lower abdominals and hip extension to 4+/5 to ascend 2 flights of stairs to apartment safely.    Sessions:  12          Goal 5:   Improve Foto Score of 36 to exceed predicted FOTO score by >17 points,  for safe return to independent living.   Sessions:  12                         Barnet Glasgow, PT,CLt

## 2016-12-30 ENCOUNTER — Inpatient Hospital Stay
Payer: Commercial Managed Care - PPO | Attending: Physician Assistant | Admitting: Rehabilitative and Restorative Service Providers"

## 2016-12-30 DIAGNOSIS — M25571 Pain in right ankle and joints of right foot: Secondary | ICD-10-CM

## 2016-12-30 DIAGNOSIS — M545 Low back pain, unspecified: Secondary | ICD-10-CM

## 2016-12-30 NOTE — PT/OT Therapy Note (Signed)
Name: Monica Romero Age: 61 y.o.   Referring Physician: Roger Kill, PA   Date of Injury: 11/26/2014  Date Care Plan Established/Reviewed: 11/25/2016  Date Treatment Started: 11/25/2016  Visit Count: 6   Diagnosis:   1. Left-sided low back pain without sciatica, unspecified chronicity    2. Pain in joint, ankle and foot, right        Subjective     Pain   Follow up with Dr Donnella Sham 12/24/16 to discuss hematologist lab work and CT scan results.Advised to start Celebrex for 1 month,and discontinue Magnesium/Ca++ supplements with current inflammatory response.CT scan negative.Feel like PT has improved mobility in right knee with OA and low back and hips to donn/doff socks and shoes.Drove to [place] and stopped every 2 hours for 6 hours to visit family for Thanksgiving.    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  -Recumbent Bike x 6 min level 1,active warm up with subjective intake  -Standing G/S stretch with balance board, 3 x 30 sec ea with upright posture  -Supine PPT with pilates ring, 5 sec hold, 1 x 10  -Supine PPT with small ball hip ADD, bridge, 5 sec hold, 1 x 15    -Supine L hip flexor stretch off table, 3 x 30 sec with the stick STM to proximal QS  -Supine left hip adductor,IR stretch, 5 x 30 sec    -R SL hooklying hip Abd L with TrA, 1 x 10(not today with pain)        Neuromuscular Re-Education   Justification: For activation and/or inhibition of target muscle,- for movement, balance, coordination, kinesthetic sense, posture, and/  or proprioception for sitting and/ or standing activities      Manual Therapy   Justification: To improve joint mobility, soft tissue mobility, and reduce trigger points    -SI METS for left anterior inomminate, Supine, 1 x 5, 5 sec hold B  - STM left QS,ITB,HS,G/S with proximal pressure in R SL    Therapeutic Activity   Justification: For improvement in the completion of ADL's and functional activities.  Dynamic activities to improve functional performance, direct (one-on-one) with the patient   Brief gait training with cane on R, Hall x 2 for safe step through non antalgic gait.Avoid standing> 1 hr at work.Use of ice,self STM,gentle stretching with R achilles tendon symptoms.    Modalities   Skin check completed    Electrical Stimulation with Ice: Premod 15 min. 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  Plan   Cont POC;Possible 2nd epidural or gluteal pain injection by Dr Donnella Sham 01/06/17.Add standing TBand multifidus and latissimus strengthening with core control on Airex,step ups to improve standing tolerance.      Goals    Goal 1:  Increase L/S SB 15,LTR to 55 to assist with transfers in and out of car.(12/30/16 KW)   Sessions:  12   Progression:  progressing      Goal 2:  Increase left hip Abd,ER strength to 4+/5 to put socks and shoes on without L/S symptoms>5/10.(12/30/16 KW)Functional without pain.   Sessions:  12   Progression:  progressing       Goal 3:  Increase B HS flexibility to -20 to reach  light items on floor.(11/13/18KW)R met.   Sessions:  12   Progression:  progressing      Goal 4:  Increase strength lower abdominals and hip extension to 4+/5 to ascend 2 flights of stairs to apartment safely.(12/30/16 KW)Able to demonstrate safe navigation.   Sessions:  12   Progression:  progressing          Goal 5:   Improve Foto Score of 36 to exceed predicted FOTO score by >17 points,  for safe return to independent living.   Sessions:  12                         Barnet Glasgow, PT

## 2017-01-06 ENCOUNTER — Inpatient Hospital Stay: Payer: Commercial Managed Care - PPO | Admitting: Rehabilitative and Restorative Service Providers"

## 2017-01-13 ENCOUNTER — Inpatient Hospital Stay
Payer: Commercial Managed Care - PPO | Attending: Physician Assistant | Admitting: Rehabilitative and Restorative Service Providers"

## 2017-01-13 DIAGNOSIS — M545 Low back pain, unspecified: Secondary | ICD-10-CM

## 2017-01-13 DIAGNOSIS — M25571 Pain in right ankle and joints of right foot: Secondary | ICD-10-CM

## 2017-01-13 NOTE — PT/OT Therapy Note (Signed)
Name: Monica Romero Age: 61 y.o.   Referring Physician: Roger Kill, PA   Date of Injury: 11/26/2014  Date Care Plan Established/Reviewed: 11/25/2016  Date Treatment Started: 11/25/2016  Visit Count: 7   Diagnosis:   1. Left-sided low back pain without sciatica, unspecified chronicity    2. 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  -SciFit Bike UE/LE x 7 min level 1,active warm up with subjective intake  -Standing G/S stretch with balance board, 3 x 30 sec ea with upright posture  -Supine PPT with pilates ring, 5 sec hold, 1 x 10  -Supine PPT with small ball hip ADD, bridge, 5 sec hold, 1 x 15    -Supine L hip flexor stretch off table, 3 x 30 sec with STM to proximal QS  -Supine left hip adductor,IR stretch, 5 x 30 sec    -R SL hooklying hip Abd L with TrA, 1 x 10        Neuromuscular Re-Education   Justification:        Manual Therapy   Justification: To improve joint mobility, soft tissue mobility, and reduce trigger points    -SI METS for left anterior inomminate, Supine, 1 x 5, 5 sec hold B  - STM left QS,ITB,HS,G/S with proximal pressure in R SL    Therapeutic Activity   Justification: For improvement in the completion of ADL's and functional activities.  Dynamic activities to improve functional performance, direct (one-on-one) with the patient   Brief instruction to modify standing posture at work: small stool for LE WB,lean  against wall,and high stool use to reduce L/S stress with demands from job.(TE chg)    Modalities   Skin check completed    Electrical Stimulation with Ice: Premod 15 min. 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  Plan   Cont POC;1 more treatment to consolidate and advance HEP for safe transition to church [facility].Add standing TBand multifidus and latissimus strengthening with core control on Airex,step ups to improve standing tolerance.FOTO,MMT,pain VAS for goals.      Goals    Goal 1:  Increase L/S SB 15,LTR  to 55 to assist with transfers in and out of car.(12/30/16 KW)   Sessions:  12   Progression:  progressing      Goal 2:  Increase left hip Abd,ER strength to 4+/5 to put socks and shoes on without L/S symptoms>5/10.(12/30/16 KW)Functional without pain.   Sessions:  12   Progression:  progressing       Goal 3:  Increase B HS flexibility to -20 to reach light items on floor.(11/13/18KW)R met.   Sessions:  12   Progression:  progressing      Goal 4:  Increase strength lower abdominals and hip extension to 4+/5 to ascend 2 flights of stairs to apartment safely.(12/30/16 KW)Able to demonstrate safe navigation.   Sessions:  12   Progression:  progressing          Goal 5:   Improve Foto Score of 36 to exceed predicted FOTO score by >17 points,  for safe return to independent living.   Sessions:  12                         Barnet Glasgow, PT,CLT

## 2017-01-20 ENCOUNTER — Inpatient Hospital Stay
Payer: Commercial Managed Care - PPO | Attending: Physician Assistant | Admitting: Rehabilitative and Restorative Service Providers"

## 2017-01-20 DIAGNOSIS — M545 Low back pain, unspecified: Secondary | ICD-10-CM

## 2017-01-20 DIAGNOSIS — M25571 Pain in right ankle and joints of right foot: Secondary | ICD-10-CM

## 2017-01-21 NOTE — PT/OT Therapy Note (Signed)
Name: Monica Romero Age: 61 y.o.   Referring Physician: Roger Kill, PA   Date of Injury: 11/26/2014  Date Care Plan Established/Reviewed: 11/25/2016  Date Treatment Started: 11/25/2016  Visit Count: 8   Diagnosis:   1. Left-sided low back pain without sciatica, unspecified chronicity    2. 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    Pain: from 47% to 23%  Satisfaction: from 2-7/10  FOTO: 36%    R knee TKE: -10,R achilles tenderness  HS flexibility: R from -25 to -20, L from -30 to -25  L/S LTR: to R remains 55, L from 50 to 75  SLS Balance: R from 15 to 20 sec, L remains 7 sec with L hip pain and use of railing  AROM L/S: SB: R from 15 to 20,L from 5 to 17    LE Strength  MMT /5 Initial  L  11/25/16   L  01/20/17   Hip Flexion(L1/2) 4+ 5-   Hip Extension NT 4+   Hip Abduction 4- 5-   Knee Flexion (S1) 4+ 5-   Knee Extension (L3) 4+ 5-   Ankle DF (L4) 4+ 5   Hip ADD 4+ 5-                 Treatment     Therapeutic Exercises   Justification: To improve: To develop  Flexibility/ROM, Stabilization  Strength  And endurance.  -SciFit Bike UE/LE x 7 min level 1,active warm up with subjective intake  -Standing G/S stretch with balance board, 3 x 30 sec ea with upright posture  -Supine PPT with pilates ring, 5 sec hold, 1 x 10  -Supine PPT with small ball hip ADD, bridge, 5 sec hold, 1 x 15    -Supine L hip flexor stretch off table, 3 x 30 sec with STM to proximal QS  -Supine left hip adductor,IR stretch, 5 x 30 sec    -R SL hooklying hip Abd L with TrA, 1 x 10        Neuromuscular Re-Education   Justification:         Manual Therapy   Justification: To improve joint mobility, soft tissue mobility, and reduce trigger points  Not today with re-eval    Therapeutic Activity   Justification: For improvement in the completion of ADL's and functional activities.  Dynamic activities to improve functional performance, direct (one-on-one) with the patient   Brief review to modify standing posture at work: small stool for LE WB,lean against wall,and high stool use to reduce L/S stress with demands from job.(TE chg)    Modalities   Skin check completed    Electrical Stimulation with Ice: Premod 15 min. Location Left lateral hip,Left L/S  Position Supine 90/90   Therapy Rationale: Decrease Pain       ---      ---   Total Time   Timed Minutes  30 minutes   Untimed Minutes  15 minutes   Total Time  45 minutes        Assessment   Increased  mobility and strength for improved functional gains.Able to modify work positions to manage symptoms.Weak lower abdominals and hip extension addressed with advanced HEP.Would benefit from daily HEP compliance for next 2 months,use of stationary bike 3 x/wk to improve LE endurance.  Plan   D/C PT.3rd epidural with Dr Brayton El next week.Hematologist follow up not scheduled.Thank you for allowing me to participate in the care of your highly motivated patient.      Goals    Goal 1:  Increase L/S SB 15,LTR to 55 to assist with transfers in and out of car.(12/30/16 KW)   Sessions:  12   Progression:  progressing      Goal 2:  Increase left hip Abd,ER strength to 4+/5 to put socks and shoes on without L/S symptoms>5/10.(01/20/17 KW)Functional without pain.   Sessions:  12   Progression:  met       Goal 3:  Increase B HS flexibility to -20 to reach light items on floor.(11/13/18KW)R met.   Sessions:  12   Progression:  met      Goal 4:  Increase strength lower abdominals and hip extension to 4+/5 to ascend 2 flights of stairs to apartment safely.(01/20/17 KW)Able to demonstrate safe navigation.   Sessions:   12   Progression:  met          Goal 5:   Improve Foto Score of 36 to exceed predicted FOTO score by >17 points,  for safe return to independent living.(01/20/17 KW)   Sessions:  12   Progression:  met                         Barnet Glasgow, PT

## 2017-02-27 DIAGNOSIS — R55 Syncope and collapse: Secondary | ICD-10-CM | POA: Insufficient documentation

## 2017-03-01 DIAGNOSIS — I2699 Other pulmonary embolism without acute cor pulmonale: Secondary | ICD-10-CM | POA: Insufficient documentation

## 2017-03-01 DIAGNOSIS — O2230 Deep phlebothrombosis in pregnancy, unspecified trimester: Secondary | ICD-10-CM | POA: Insufficient documentation

## 2017-03-01 DIAGNOSIS — J45909 Unspecified asthma, uncomplicated: Secondary | ICD-10-CM | POA: Insufficient documentation

## 2017-03-01 DIAGNOSIS — I8290 Acute embolism and thrombosis of unspecified vein: Secondary | ICD-10-CM | POA: Insufficient documentation

## 2017-03-01 DIAGNOSIS — E119 Type 2 diabetes mellitus without complications: Secondary | ICD-10-CM | POA: Insufficient documentation

## 2017-03-24 ENCOUNTER — Encounter: Payer: Self-pay | Admitting: Rehabilitative and Restorative Service Providers"

## 2017-03-24 ENCOUNTER — Inpatient Hospital Stay
Payer: Commercial Managed Care - PPO | Attending: Physician Assistant | Admitting: Rehabilitative and Restorative Service Providers"

## 2017-03-24 VITALS — BP 140/82 | HR 63

## 2017-03-24 DIAGNOSIS — M5442 Lumbago with sciatica, left side: Secondary | ICD-10-CM | POA: Insufficient documentation

## 2017-03-24 NOTE — Progress Notes (Signed)
Name: Monica Romero Age: 62 y.o.   Referring Physician: Roger Kill, PA   Date of Injury: 11/17/2016  Date Care Plan Established/Reviewed: 03/24/2017  Date Treatment Started: 03/24/2017  Visit Count: 1   Diagnosis:   1. Acute left-sided low back pain with left-sided sciatica        Subjective     History of Present Illness   Mechanism of injury: Extensive Health History x 4 years. R shoulder [facility] x 12, 3 yrs ago. R Achilles pain x 3 yrs. Cast x 2 months, boot, stem cell injections x 2 months. Vein ablasions 6 month ago: R calf, L thigh.  History of Present Illness: Left>right low back and hip pain ~ 3 yrs. Compensatory gait with R plantar fascitis x 8 yrs, calcaneal debridement, achilles tendonitis R, B LE venous insufficiency. Knee high compression garments with prolonged walking and standing with full time job. PT for R AT and PF complete 9 months ago. [facility] injection over left L/S a couple of months ago. 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 Able to navigate 2 flights of stairs within home (painful with ascending 2 flights to apartment). Able to stand for 2 hours with full time job requirements (limited to 30 min with pain). Light housework and meal preparation since lives alone(limited sweeping,mopping,lifting). Able to sleep through the night (awakens with low back and achilles pain).    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    Scapulae  Right elevated and right protracted.    Pelvis   Pelvis (Left): Elevated.     Knee   Knee (Right): Flexed.     Gait   Left Side: decreased stance phase decreased hip extension  Right Side: decreased hip extension    Range of Motion     AROM: Lumbar Spine       Lumbar Flexion  68   Lumbar Extension  5   (blank fields were intentionally left blank)       Left AROM    Left PROM   Lumbar/Hip    Right AROM    Right PROM       Lumbar Rotation        98   Hip Flexion  97         Hip Extension        33   Hip Abduction  37         Hip Adduction  Hip IR           Hip ER       (blank fields were intentionally left blank)    AROM right knee ext: -12 deg    AROM trunk:  Left SB 12  Right SB 19        Strength        Left Strength  Hip      Right    4/5 Hip Flexion  4+/5     Hip Extension      -4/5 Hip Abduction  5/5     Hip Adduction       Hip IR       Hip ER       Quadriceps       Hamstrings     (blank fields were intentionally left blank)       Left Strength  Knee      Right    -4/5 Knee Flexion  4+/5    4+/5 Knee Extension  5/5   (blank fields were intentionally left blank)       Left Strength  Ankle/Foot      Right    4+/5 Ankle Dorsiflexion  5/5     Ankle Plantarflexion       Ankle Inversion       Ankle Eversion     (blank fields were intentionally left blank)  Quality of the abdominal muscle contraction: Poor    Flexibility Bilateral hamstring WFL  Piriformis min tightness bilaterally    Neurological Testing     Reflexes   Left   Patellar (L4): trace (1+)    Right   Patellar (L4): trace (1+)      BP: 140/82 Heart Rate: 63    Treatment     Therapeutic Exercises    Justification: To improve flexibility, strength and stabilization   - Right quad sets x 5 sec x 10  - Left piriformis stretch x 30 sec x 3  - Left SKC x 20 sec x 3  - Isom hip add in supine with ball x 5 sec x 10  - Isom hip abd in supine with belt x 5 sec x 10  Reviewed HEP and provided a copy       ---      ---   Total Time   Timed Minutes  20 minutes   Untimed Minutes  30 minutes   Total Time  50 minutes        Assessment   The patient is a 63 y.o. female presenting with left sided low back pain with left sided sciatica who requires Physical Therapy for the following:  Impairments: Decreased L/S mobility, Left SI dysfunction, right knee -10 TKE (extension lack), weak lower abdominals and hip  muscles.  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 Able to navigate 2 flights of stairs within home (painful with ascending 2 flights to apartment). Able to stand for 2 hours with full time job requirements (limited to 30 min with pain). Light housework and meal preparation since lives alone(limited sweeping,mopping,lifting). Able to sleep through the night (awakens with low back and achilles pain).  Prognosis: fair  Plan   Visits per week: 2  Number of Sessions: 16  Direct One on One  16109: Therapeutic Exercise: To Develop Strength and Endurance, ROM and Flexibility  [id number]: Neuromuscular Reeducation  97140: Manual Therapy techniques (mobilization, manipulation, manual traction)  97530: Therapeutic Activities: Dynamic activities to improve functional performance  Supervised Modalities  97010: Thermal modalities: hot/cold packs  60454: Electrical stimulation  Next visit: MT: STM over paraspinal muscles, glut and hip rotators with the stick  (as needed)      Goals    Goal 1:  Improve PSFS by 50% to be able to sit x 20-30 minutes without shifting to right.    Sessions:  8      Goal 2:  Improve right knee extension to -5 degrees to be able to stand x 15 min to wash dishes or prepare meals with pain level of 3/10.   Sessions:  8      Goal 3:  Improve strength in the abdominal muscles to "Good" to be able to ambulate x 20-30 minutes without back pain.   Sessions:  16      Goal 4:  Increase FOTO to 52 from 37 to indicate functional gains.   Sessions:  16          Goal 5:  Patient will demonstrate independence in prescribed HEP with proper form, sets and reps for safe discharge to an independent program.   Sessions:  16                         Peyton Bottoms, PT,MDT

## 2017-03-26 ENCOUNTER — Inpatient Hospital Stay
Payer: Commercial Managed Care - PPO | Attending: Physician Assistant | Admitting: Rehabilitative and Restorative Service Providers"

## 2017-03-26 DIAGNOSIS — M5442 Lumbago with sciatica, left side: Secondary | ICD-10-CM | POA: Insufficient documentation

## 2017-03-26 NOTE — PT/OT Therapy Note (Signed)
Name: Monica Romero Age: 62 y.o.   Referring Physician: Roger Kill, PA   Date of Injury: 11/17/2016  Date Care Plan Established/Reviewed: 03/24/2017  Date Treatment Started: 03/24/2017  Visit Count: 2   Diagnosis:   1. 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        AROM right knee ext: -6 deg (-12 deg at IE)              Treatment     Therapeutic Exercises   Justification: To improve: To develop  Flexibility/ROM, Stabilization  Strength and endurance.  - Right quad sets x 5 sec x 10 (to improve right ext lack)  - Left piriformis stretch x 30 sec x 3  - Left SKC x 30 sec x 3  - Glut sets x 5 sec x 10  Reviewed HEP      Neuromuscular Re-Education   Justification: For activation and/or inhibition of target muscle,- for movement, balance, coordination, kinesthetic sense, posture, and/ or proprioception for sitting and/ or standing activities    TA activation: VC/TC needed for proper contraction  - Isom hip add in supine with ball engaging TA x 5 sec x 10  - Isom hip abd in supine with belt engaging TA x 5 sec x 10  - Knee to chest bilat with legs on ball in supine engaging TA x 10  - Trunk rot with legs on ball in supine engaging TA x 10  - Marching in supine with TA x 10    Manual Therapy   Justification: To improve joint mobility, soft tissue mobility, and reduce trigger points  In right side lying:  STM----> DTM over left paraspinal muscles @ L2-L5, glut, hip rotators, ITB and hams, manual stretch left ITB over the edge of table,     Modalities   Electrical Stimulation with Ice: Premod 15 min. Location left SI joint and L4-5 Position Supine 90/90 and None  Therapy Rationale: Decrease Pain and Decrease Inflammation       ---       ---   Total Time   Timed Minutes  50 minutes   Untimed Minutes  15 minutes   Total Time  65 minutes        Assessment   Patient demonstrated no produced symptoms with given exercises today. Some improvements with right knee extension, still ambulating with decreased left stance phase due to pain in left low back.  Plan   Continue with Plan of Care.      Goals    Goal 1:  Improve PSFS by 50% to be able to sit x 20-30 minutes without shifting to right.    Sessions:  8      Goal 2:  Improve right knee extension to -5 degrees to be able to stand x 15 min to wash dishes or prepare meals with pain level of 3/10.   Sessions:  8      Goal 3:  Improve strength in the abdominal muscles to "Good" to be able to ambulate x 20-30 minutes without back pain.   Sessions:  16  Goal 4:  Increase FOTO to 52 from 37 to indicate functional gains.   Sessions:  16          Goal 5:  Patient will demonstrate independence in prescribed HEP with proper form, sets and reps for safe discharge to an independent program.   Sessions:  16                         Peyton Bottoms, PT,MDT

## 2017-04-01 ENCOUNTER — Inpatient Hospital Stay
Payer: Commercial Managed Care - PPO | Attending: Physician Assistant | Admitting: Rehabilitative and Restorative Service Providers"

## 2017-04-01 DIAGNOSIS — M5442 Lumbago with sciatica, left side: Secondary | ICD-10-CM | POA: Insufficient documentation

## 2017-04-01 NOTE — PT/OT Therapy Note (Signed)
Name: ATHA MURADYAN Age: 62 y.o.   Referring Physician: Roger Kill, PA   Date of Injury: 11/17/2016  Date Care Plan Established/Reviewed: 03/24/2017  Date Treatment Started: 03/24/2017  Visit Count: 3   Diagnosis:   1. 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        AROM right knee ext: -4 deg (-12 deg at IE)                    Treatment     Therapeutic Exercises   Justification: To improve: To develop  Flexibility/ROM, Stabilization  Strength and endurance.  - Right quad sets x 5 sec x 10 (to improve right ext lack)  - Left piriformis stretch x 30 sec x 3  - Bilateral SKC x 30 sec x 3  - Mini bridge x 5 sec x 10  Reviewed HEP (added marching with TA in supine)      Neuromuscular Re-Education   Justification: For activation and/or inhibition of target muscle,- for movement, balance, coordination, kinesthetic sense, posture, and/ or proprioception for sitting and/ or standing activities    TA activation: VC/TC needed for proper contraction  - Isom hip add in supine with ball engaging TA x 5 sec x 10  - Isom hip abd in supine with belt engaging TA x 5 sec x 10  - Knee to chest bilat with legs on ball in supine engaging TA x 10  - Trunk rot with legs on ball in supine engaging TA x 10  - Marching in supine with TA x 10    Manual Therapy   Justification: To improve joint mobility, soft tissue mobility, and reduce trigger points  In right side lying:  STM----> DTM over left paraspinal muscles @ L2-L5, glut, hip rotators, ITB and hams, manual stretch left ITB over the edge of table, STM over right calf and distal right hams with the stick     Modalities   Electrical Stimulation with Ice:  Premod 15 min. Location left SI joint and L4-5 Position Supine 90/90 and None  Therapy Rationale: Decrease Pain and Decrease Inflammation       ---      ---   Total Time   Timed Minutes  50 minutes   Untimed Minutes  15 minutes   Total Time  65 minutes        Assessment   Patient continues to demonstrate improvement with right knee extension, able to ambulate with improved stance phase. Less pain intensity in right knee and left low back. Symptoms are improving fairly well.  Plan   Continue with Plan of Care.      Goals    Goal 1:  Improve PSFS by 50% to be able to sit x 20-30 minutes without shifting to right.    Sessions:  8      Goal 2:  Improve right knee extension to -5 degrees to be able to stand x 15 min to wash dishes or prepare meals with pain level of 3/10.   Sessions:  8  Goal 3:  Improve strength in the abdominal muscles to "Good" to be able to ambulate x 20-30 minutes without back pain.   Sessions:  16      Goal 4:  Increase FOTO to 52 from 37 to indicate functional gains.   Sessions:  16          Goal 5:  Patient will demonstrate independence in prescribed HEP with proper form, sets and reps for safe discharge to an independent program.   Sessions:  16                         Peyton Bottoms, PT,MDT

## 2017-04-03 ENCOUNTER — Inpatient Hospital Stay: Payer: Commercial Managed Care - PPO | Attending: Physician Assistant

## 2017-04-03 DIAGNOSIS — M5442 Lumbago with sciatica, left side: Secondary | ICD-10-CM

## 2017-04-03 NOTE — PT/OT Therapy Note (Signed)
Name: Monica Romero Age: 62 y.o.   Referring Physician: Roger Kill, PA   Date of Injury: 11/17/2016  Date Care Plan Established/Reviewed: 03/24/2017  Date Treatment Started: 03/24/2017  Visit Count: 4   Diagnosis:   1. 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        AROM right knee ext: -4 deg (-12 deg at IE)                    Treatment     Therapeutic Exercises   Justification: To improve: To develop  Flexibility/ROM, Stabilization  Strength and endurance.  - Right quad sets x 5 sec x 10 (to improve right ext lack)  - Left piriformis stretch x 30 sec x 3  - Bilateral SKC x 30 sec x 3  - Mini bridge x 5 sec x 10  Reviewed HEP (added marching with TA in supine)      Neuromuscular Re-Education   Justification: For activation and/or inhibition of target muscle,- for movement, balance, coordination, kinesthetic sense, posture, and/ or proprioception for sitting and/ or standing activities    TA activation: VC/TC needed for proper contraction  - Isom hip add in supine with ball engaging TA x 5 sec x 10  - Isom hip abd in supine with belt engaging TA x 5 sec x 10  - Knee to chest bilat with legs on ball in supine engaging TA x 10  - Trunk rot with legs on ball in supine engaging TA x 10  - Marching in supine with TA x 10    Manual Therapy   Justification: To improve joint mobility, soft tissue mobility, and reduce trigger points  In right side lying:  STM----> DTM over left piriformis/gluts and lumbar paraspinals     Therapeutic Activity    Access Code: [email or web address]   URL: [email or web address]   Date: 04/03/2017   Prepared by: Barton Fanny      Exercises Supine Sciatic Nerve Glide - 10 reps - 3 sets - 1x daily - 7x weekly   Patient  Education .Piriformis Syndrome      Modalities   Skin check completed    Electrical Stimulation with Ice: Premod 15 min. Location left SI joint and L4-5 Position Supine 90/90 and None  Therapy Rationale: Decrease Pain and Decrease Inflammation       ---      ---   Total Time   Timed Minutes  50 minutes   Untimed Minutes  15 minutes   Total Time  65 minutes        Assessment   Pt with tenderness and tenderness over L piriformis, encouraged pt to continue with HEP especially piriformis stretch.    Plan   Continue with Plan of Care.      Goals    Goal 1:  Improve PSFS by 50% to be able to sit x 20-30 minutes without shifting to right.    Sessions:  8      Goal 2:  Improve right knee extension to -5 degrees to be able to stand x 15 min to wash dishes or prepare  meals with pain level of 3/10.   Sessions:  8      Goal 3:  Improve strength in the abdominal muscles to "Good" to be able to ambulate x 20-30 minutes without back pain.   Sessions:  16      Goal 4:  Increase FOTO to 52 from 37 to indicate functional gains.   Sessions:  16          Goal 5:  Patient will demonstrate independence in prescribed HEP with proper form, sets and reps for safe discharge to an independent program.   Sessions:  [address], LPTA

## 2017-04-06 ENCOUNTER — Inpatient Hospital Stay: Payer: Commercial Managed Care - PPO | Attending: Physician Assistant

## 2017-04-06 DIAGNOSIS — M5442 Lumbago with sciatica, left side: Secondary | ICD-10-CM

## 2017-04-06 NOTE — PT/OT Therapy Note (Signed)
Name: Monica Romero Age: 62 y.o.   Referring Physician: Roger Kill, PA   Date of Injury: 11/17/2016  Date Care Plan Established/Reviewed: 03/24/2017  Date Treatment Started: 03/24/2017  Visit Count: 5   Diagnosis:   1. 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  On call for jury duty starting tomorrow, worried if I get picked with all the sitting.        AROM right knee ext: -4 deg (-12 deg at IE)                    Treatment     Therapeutic Exercises   Justification: To improve: To develop  Flexibility/ROM, Stabilization  Strength and endurance.  - Right quad sets x 5 sec x 10 (to improve right ext lack)  - Left piriformis stretch x 30 sec x 3  - Bilateral SKC x 30 sec x 3  - Mini bridge x 5 sec x 10  -UBE arms/legs 5 min  -Leg Press 70# 2x10  -childs pose with blue physioball 10x       Neuromuscular Re-Education   Justification: For activation and/or inhibition of target muscle,- for movement, balance, coordination, kinesthetic sense, posture, and/ or proprioception for sitting and/ or standing activities    TA activation: VC/TC needed for proper contraction  - Isom hip add in supine with ball engaging TA x 5 sec x 10  - Isom hip abd in supine with belt engaging TA x 5 sec x 10  - Knee to chest bilat with legs on ball in supine engaging TA x 10  - Trunk rot with legs on ball in supine engaging TA x 10  - Marching in supine with TA x 10    Manual Therapy   Justification: To improve joint mobility, soft tissue mobility, and reduce trigger points  In right side lying:  STM----> DTM over left piriformis/gluts and lumbar paraspinals     Therapeutic Activity    Access Code: [email or web address]   URL: [email or web address]   Date: 04/03/2017   Prepared by: Barton Fanny      Exercises Supine Sciatic Nerve Glide - 10 reps - 3 sets - 1x daily - 7x weekly   Patient Education .Piriformis Syndrome      Modalities   Skin check completed    Electrical Stimulation with Ice: Premod 15 min. Location left SI joint and L4-5 Position Supine 90/90 and None  Therapy Rationale: Decrease Pain and Decrease Inflammation       ---      ---   Total Time   Timed Minutes  45 minutes   Total Time  45 minutes        Assessment   Progressed gym program today, pt reports relief with manual treatment to L piriformis   Plan   Continue with Plan of Care.      Goals    Goal 1:  Improve PSFS by 50% to be able to sit x 20-30 minutes without shifting to right.    Sessions:  8      Goal 2:  Improve right knee extension to -5 degrees to be able  to stand x 15 min to wash dishes or prepare meals with pain level of 3/10.   Sessions:  8      Goal 3:  Improve strength in the abdominal muscles to "Good" to be able to ambulate x 20-30 minutes without back pain.   Sessions:  16      Goal 4:  Increase FOTO to 52 from 37 to indicate functional gains.   Sessions:  16          Goal 5:  Patient will demonstrate independence in prescribed HEP with proper form, sets and reps for safe discharge to an independent program.   Sessions:  [address] LPTA

## 2017-07-17 ENCOUNTER — Other Ambulatory Visit: Payer: Self-pay | Admitting: Physician Assistant

## 2017-07-17 ENCOUNTER — Ambulatory Visit: Payer: Commercial Managed Care - PPO | Attending: Physician Assistant

## 2017-07-17 DIAGNOSIS — M25561 Pain in right knee: Secondary | ICD-10-CM

## 2017-07-23 ENCOUNTER — Ambulatory Visit (INDEPENDENT_AMBULATORY_CARE_PROVIDER_SITE_OTHER): Payer: Managed Care, Other (non HMO) | Admitting: Pulmonary Disease

## 2017-07-23 ENCOUNTER — Encounter: Payer: Self-pay | Admitting: Pulmonary Disease

## 2017-07-23 VITALS — BP 164/104 | HR 73 | Ht 67.0 in | Wt 222.0 lb

## 2017-07-23 DIAGNOSIS — Z86711 Personal history of pulmonary embolism: Secondary | ICD-10-CM

## 2017-07-23 DIAGNOSIS — J4540 Moderate persistent asthma, uncomplicated: Secondary | ICD-10-CM | POA: Diagnosis not present

## 2017-07-23 DIAGNOSIS — G4733 Obstructive sleep apnea (adult) (pediatric): Secondary | ICD-10-CM

## 2017-07-23 MED ORDER — MONTELUKAST SODIUM 10 MG PO TABS: 10.0000 mg | ORAL_TABLET | Freq: Every day | ORAL | 5 refills | Status: DC

## 2017-07-23 MED ORDER — MONTELUKAST SODIUM 10 MG PO TABS: 10.0000 mg | ORAL_TABLET | Freq: Every day | ORAL | 11 refills | Status: DC

## 2017-07-23 NOTE — Progress Notes (Signed)
[facility]  Chief Complaint  Patient presents with  . pulm consult    Pt is self referral for PE. Pt has increase SOB with exertion, wheezing, productive cough-clear/yellow. Pt has some chest pain and tightness in last month.    Vital signs: BP (!) 164/104 (BP Location: Left Arm, Cuff Size: Normal)   Pulse 73   Ht 5' 7"  (1.702 m)   Wt 222 lb (100.7 kg)   SpO2 96%   BMI 34.77 kg/m   History of Present Illness: Isabel Garcia is a 62 y.o. female with dyspnea.  She is from [place].  Her sister works with [employer] with [employer].  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  She had syncopal event in January 2019.  Found to have PE.  She then was seen by pulmonary.  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  She also had home sleep study that showed severe sleep apnea.  Then had CPAP titration study.  Hasn't been set up on CPAP yet.  She still has cough, chest congestion, and tightness/discomfort in chest on Lt side mostly.  Not having wheeze, fever, sputum, or hemoptysis.  Has leg cramps, but no swelling.    She had pneumonia years ago.  No history of TB.  No animal exposures.  She is applying for disability.  She no longer can do the physical demands of her work related to extensive walking and exposure to different environmental extremes.  She has snoring, sleep disruption, wakes up feeling like she can't breath, and is sleepy during the day.   Physical Exam:  General - pleasant Eyes - pupils reactive ENT - no sinus tenderness, no oral exudate, no LAN Cardiac - regular, no murmur Chest - b/l rhonchi that partially clear with cough Abd - soft, non tender Ext - no edema Skin - no rashes Neuro - normal strength Psych - normal mood   Assessment/Plan:  History of pulmonary embolism. - continue elquis - will arrange for Echo to assess RV function and ?hx of valvular heart  disease  Moderate persistent asthma. - continue breo and prn albuterol - add singulair - get copy of notes and PFT from pulmonary in [place]  Obstructive sleep apnea. - will arrange for CPAP 10 cm H2O   Patient Instructions  Will arrange for CPAP set up and Echocardiogram Montelukast 10 mg pill nightly Will get copy of breathing test and office notes from lung doctor in [place]  Follow up in 2 weeks with Dr. Halford Chessman or Nurse Practitioner    Chesley Mires, MD [place] 07/23/2017, 12:21 PM  Flow Sheet  Pulmonary tests: CT angio chest 03/01/17 >> b/l PE, patchy basilar GGO CT chest 07/15/17 >> small PE LLL segmental artery  Sleep tests: HST 04/24/17 >> AHI 29.9, SpO2 low 67% CPAP 07/04/17 >> CPAP 10 cm H2O   Cardiac tests: Doppler legs 03/01/17 >> nonocclusive DVT Rt popliteal vein  Review of Systems: Constitutional: Negative for fever and unexpected weight change.  HENT: Positive for congestion and nosebleeds. Negative for dental problem, ear pain, postnasal drip, rhinorrhea, sinus pressure, sneezing, sore throat and trouble swallowing.   Eyes: Negative for redness and itching.  Respiratory: Positive for cough, chest tightness, shortness of breath and wheezing.   Cardiovascular: Positive for chest pain, palpitations and leg swelling.  Gastrointestinal: Negative for nausea and vomiting.  Genitourinary: Negative for dysuria.  Musculoskeletal:  Positive for joint swelling.  Skin: Negative for rash.  Allergic/Immunologic: Negative.  Negative for environmental allergies, food allergies and immunocompromised state.  Neurological: Positive for headaches.  Hematological: Bruises/bleeds easily.  Psychiatric/Behavioral: Negative for dysphoric mood. The patient is not nervous/anxious.    Past Medical History: She  has a past medical history of Asthma, Diverticulosis, Hypertension, and Liver cirrhosis secondary to NASH (nonalcoholic steatohepatitis) ([place]).  Past  Surgical History: She  has a past surgical history that includes Abdominal hysterectomy and Knee arthroscopy (Right).  Family History: Her family history is not on file.  Social History: She  reports that she has never smoked. She has never used smokeless tobacco. She reports that she drinks alcohol. She reports that she does not use drugs.  Medications: Allergies as of 07/23/2017   No Known Allergies     Medication List        Accurate as of 07/23/17 12:21 PM. Always use your most recent med list.          albuterol 108 (90 Base) MCG/ACT inhaler Commonly known as:  PROVENTIL HFA;VENTOLIN HFA Inhale 1-2 puffs into the lungs every 6 (six) hours as needed for wheezing or shortness of breath.   PROVENTIL HFA 108 (90 Base) MCG/ACT inhaler Generic drug:  albuterol Inhale 2 puffs into the lungs every 6 (six) hours as needed.   amLODipine 5 MG tablet Commonly known as:  NORVASC Take 5 mg by mouth daily.   BREO ELLIPTA 100-25 MCG/INH Aepb Generic drug:  fluticasone furoate-vilanterol Inhale 1 puff into the lungs daily.   chlorthalidone 25 MG tablet Commonly known as:  HYGROTON Take 25 mg by mouth daily.   ELIQUIS 5 MG Tabs tablet Generic drug:  apixaban Take 5 mg by mouth 2 (two) times daily.   loperamide 2 MG capsule Commonly known as:  IMODIUM Take 1 capsule (2 mg total) by mouth 4 (four) times daily as needed for diarrhea or loose stools.   metoprolol succinate 25 MG 24 hr tablet Commonly known as:  TOPROL-XL Take 25 mg by mouth every morning.   montelukast 10 MG tablet Commonly known as:  SINGULAIR Take 1 tablet (10 mg total) by mouth at bedtime.   ondansetron 4 MG disintegrating tablet Commonly known as:  ZOFRAN ODT Take 1 tablet (4 mg total) by mouth every 8 (eight) hours as needed for nausea or vomiting.   prednisoLONE 15 MG/5ML solution Commonly known as:  ORAPRED Take 30 mg by mouth daily before breakfast.

## 2017-07-23 NOTE — Patient Instructions (Signed)
Will arrange for CPAP set up and Echocardiogram Montelukast 10 mg pill nightly Will get copy of breathing test and office notes from lung doctor in [place]  Follow up in 2 weeks with Dr. Halford Chessman or Nurse Practitioner

## 2017-07-23 NOTE — Addendum Note (Signed)
Addended by: Georjean Mode on: 07/23/2017 12:27 PM   Modules accepted: Orders

## 2017-07-23 NOTE — Progress Notes (Signed)
   Subjective:    Patient ID: Carlos Levering, female    DOB: March 14, 1955, 62 y.o.   MRN: [id number]  HPI    Review of Systems  Constitutional: Negative for fever and unexpected weight change.  HENT: Positive for congestion and nosebleeds. Negative for dental problem, ear pain, postnasal drip, rhinorrhea, sinus pressure, sneezing, sore throat and trouble swallowing.   Eyes: Negative for redness and itching.  Respiratory: Positive for cough, chest tightness, shortness of breath and wheezing.   Cardiovascular: Positive for chest pain, palpitations and leg swelling.  Gastrointestinal: Negative for nausea and vomiting.  Genitourinary: Negative for dysuria.  Musculoskeletal: Positive for joint swelling.  Skin: Negative for rash.  Allergic/Immunologic: Negative.  Negative for environmental allergies, food allergies and immunocompromised state.  Neurological: Positive for headaches.  Hematological: Bruises/bleeds easily.  Psychiatric/Behavioral: Negative for dysphoric mood. The patient is not nervous/anxious.        Objective:   Physical Exam        Assessment & Plan:

## 2017-07-29 ENCOUNTER — Ambulatory Visit (HOSPITAL_COMMUNITY): Payer: Managed Care, Other (non HMO) | Attending: Cardiology

## 2017-07-29 ENCOUNTER — Other Ambulatory Visit: Payer: Self-pay

## 2017-07-29 DIAGNOSIS — Z86711 Personal history of pulmonary embolism: Secondary | ICD-10-CM

## 2017-07-30 ENCOUNTER — Telehealth: Payer: Self-pay | Admitting: Pulmonary Disease

## 2017-07-30 NOTE — Telephone Encounter (Signed)
Echo 07/29/17 >> EF 65 to 70%, grade 1 DD   Please let her know her Echo was normal.  No evidence of heart strain from hx of PE, and no evidence for valve murmur.

## 2017-08-03 NOTE — Telephone Encounter (Signed)
Called and spoke with pt letting her know the results of echo.  Pt expressed understanding. Nothing further needed.

## 2017-08-10 NOTE — Progress Notes (Signed)
@Patient  ID: Isabel Garcia, female    DOB: Aug 08, 1955, 62 y.o.   MRN: [id number]  Chief Complaint  Patient presents with  . Follow-up    Echo 6/26, disability paperwork, has not started cpap yet, will call while in office today. States her SOB is getting worse, chest pain, chest tightness, SOB while talking.     Referring provider: No ref. provider found  HPI: 62 yo female patient followed for OSA and Astma. 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 Pt of Dr. Halford Garcia.    Recent [facility] Encounters:   07/23/17 - Initial Office Visit - Isabel Garcia  Plan: get records of last PFT/sleep study, Cpap 10, cont breo, order echo  Tests:  Pulmonary tests: CT angio chest 03/01/17 >> b/l PE, patchy basilar GGO CT chest 07/15/17 >> small PE LLL segmental artery  Sleep tests: HST 04/24/17 >> AHI 29.9, SpO2 low 67% CPAP 07/04/17 >> CPAP 10 cm H2O   Cardiac tests: Doppler legs 03/01/17 >> nonocclusive DVT Rt popliteal vein Echo 07/29/17 >> EF 65 to 70%, grade 1 DD    08/11/17 OV Isabel Garcia 62 year old patient seen in office today for 2-week follow-up visit.  Patient of Dr. Halford Garcia.  Patient is completed echocardiogram which was unremarkable.  Patient is continued Breo Ellipta inhaler.  Patient adherent to Singulair and says that this has helped some.  Patient still with consistent cough, occasionally productive with white thick mucus, shortness of breath, chest tightness.  Patient with persistent nausea.  Patient using Isabel Garcia rescue inhaler 1 time daily.  Patient has disability forms that need to be completed.  Unfortunately were unable to complete these until we have full pulmonary function test results from [place].  No Known Allergies  Immunization History  Administered Date(s) Administered  . Influenza Split 11/22/2016    Past Medical History:  Diagnosis Date  . Asthma   . Diverticulosis   . Hypertension   . Liver cirrhosis secondary to NASH (nonalcoholic steatohepatitis) (HCC)     Tobacco  History: Social History   Tobacco Use  Smoking Status Never Smoker  Smokeless Tobacco Never Used   Counseling given: Not Answered Continue not smoking.   Outpatient Encounter Medications as of 08/11/2017  Medication Sig  . albuterol (PROVENTIL HFA) 108 (90 Base) MCG/ACT inhaler Inhale 2 puffs into the lungs every 6 (six) hours as needed.  Marland Kitchen albuterol (PROVENTIL HFA;VENTOLIN HFA) 108 (90 Base) MCG/ACT inhaler Inhale 1-2 puffs into the lungs every 6 (six) hours as needed for wheezing or shortness of breath.  Marland Kitchen apixaban (ELIQUIS) 5 MG TABS tablet Take 5 mg by mouth 2 (two) times daily.  . fluticasone furoate-vilanterol (BREO ELLIPTA) 100-25 MCG/INH AEPB Inhale 1 puff into the lungs daily.  Marland Kitchen loperamide (IMODIUM) 2 MG capsule Take 1 capsule (2 mg total) by mouth 4 (four) times daily as needed for diarrhea or loose stools.  . metoprolol succinate (TOPROL-XL) 25 MG 24 hr tablet Take 25 mg by mouth every morning.  . montelukast (SINGULAIR) 10 MG tablet Take 1 tablet (10 mg total) by mouth at bedtime.  . montelukast (SINGULAIR) 10 MG tablet Take 1 tablet (10 mg total) by mouth at bedtime.  . ondansetron (ZOFRAN ODT) 4 MG disintegrating tablet Take 1 tablet (4 mg total) by mouth every 8 (eight) hours as needed for nausea or vomiting.  . prednisoLONE (ORAPRED) 15 MG/5ML solution Take 30 mg by mouth daily before breakfast.  . [DISCONTINUED] amLODipine (NORVASC) 5 MG tablet Take 5 mg by mouth daily.  . [  DISCONTINUED] chlorthalidone (HYGROTON) 25 MG tablet Take 25 mg by mouth daily.  Marland Kitchen amLODipine (NORVASC) 10 MG tablet Take 1 tablet (10 mg total) daily for high blood pressure.  . benzonatate (TESSALON) 200 MG capsule Take 1 capsule (200 mg total) by mouth 3 (three) times daily as needed for cough.  Marland Kitchen omeprazole (PRILOSEC) 20 MG capsule Take 1 capsule (20 mg total) by mouth daily.   No facility-administered encounter medications on file as of 08/11/2017.      Review of Systems  Review of Systems    Constitutional: Positive for activity change, fatigue and fever. Negative for chills.       Hot flashes - pre menopausal   HENT: Positive for congestion, nosebleeds, postnasal drip, sinus pain, sore throat and voice change (hoarseness). Negative for sinus pressure.   Respiratory: Positive for cough (productive, no color), chest tightness and shortness of breath. Negative for choking and wheezing.   Cardiovascular: Positive for chest pain. Negative for palpitations and leg swelling.  Gastrointestinal: Positive for nausea. Negative for blood in stool, constipation, diarrhea and vomiting.       Frequent stools  Genitourinary: Negative for hematuria and urgency.  Musculoskeletal: Negative for arthralgias.  Skin: Negative for color change and rash.  Neurological: Negative for dizziness, light-headedness and headaches.  Psychiatric/Behavioral: Negative for dysphoric mood. The patient is not nervous/anxious.   All other systems reviewed and are negative.    Physical Exam  BP (!) 168/90   Pulse 89   Ht 5' 7"  (1.702 m)   Wt 226 lb (102.5 kg)   SpO2 97%   BMI 35.40 kg/m   Wt Readings from Last 5 Encounters:  08/11/17 226 lb (102.5 kg)  07/23/17 222 lb (100.7 kg)  02/18/16 225 lb 9.6 oz (102.3 kg)     Physical Exam  Constitutional: She is oriented to person, place, and time and well-developed, well-nourished, and in no distress. No distress.  HENT:  Head: Normocephalic and atraumatic.  Right Ear: External ear normal.  Left Ear: External ear normal.  Nose: Nose normal. Right sinus exhibits no maxillary sinus tenderness and no frontal sinus tenderness. Left sinus exhibits no maxillary sinus tenderness and no frontal sinus tenderness.  Mouth/Throat: Uvula is midline, oropharynx is clear and moist and mucous membranes are normal. Mucous membranes are not dry. No oropharyngeal exudate.  Eyes: Pupils are equal, round, and reactive to light. EOM are normal.  Neck: Normal range of motion.  Neck supple. No thyromegaly present.  Cardiovascular: Normal rate, regular rhythm and normal heart sounds.  Pulmonary/Chest: Effort normal and breath sounds normal. No accessory muscle usage. No respiratory distress. She has no decreased breath sounds. She has no wheezes. She has no rales.  Deep breath elicits cough.  Cough is productive with clear to white sputum.    Abdominal: Soft. Bowel sounds are normal. She exhibits no distension. There is tenderness (RUA occasional, slightly present on exam).  Lymphadenopathy:    She has no cervical adenopathy.  Neurological: She is alert and oriented to person, place, and time. Gait normal.  Skin: Skin is warm and dry. She is not diaphoretic.  Psychiatric: Mood, memory, affect and judgment normal.  Nursing note and vitals reviewed.    Lab Results:  CBC    Component Value Date/Time   WBC 11.7 (H) 02/18/2016 0059   RBC 4.78 02/18/2016 0059   HGB 14.7 02/18/2016 0059   HCT 41.5 02/18/2016 0059   PLT 322 02/18/2016 0059   MCV 86.8 02/18/2016 0059  MCH 30.8 02/18/2016 0059   MCHC 35.4 02/18/2016 0059   RDW 13.6 02/18/2016 0059    BMET    Component Value Date/Time   NA 133 (L) 02/18/2016 0059   K 3.0 (L) 02/18/2016 0059   CL 96 (L) 02/18/2016 0059   CO2 27 02/18/2016 0059   GLUCOSE 99 02/18/2016 0059   BUN 21 (H) 02/18/2016 0059   CREATININE 0.97 02/18/2016 0059   CALCIUM 8.9 02/18/2016 0059   GFRNONAA >60 02/18/2016 0059   GFRAA >60 02/18/2016 0059    BNP No results found for: BNP  ProBNP No results found for: PROBNP  Imaging: No results found.   Assessment & Plan:   Isabel Garcia 62 year old patient seen in office today.  Patient with persistent cough.  Will treat cough aggressively with current GERD management as well as Tessalon.  Discussed with patient.  Attempted to do Feno today in office.  Patient was unable to perform test.  Continue Breo 100 at this time.  Continue Singulair.  Continue Eliquis.  Blood pressure  elevated at today's office visit.  Will increase amlodipine to 10 mg daily.  Referred patient to primary care.  As patient has recently moved to the area.  Discussed with patient extensively that I will not be managing this long-term but it is more than happy to help patient in the short-term until she establishes with primary care.  Followed up with DME for CPAP supplies for patient.  Patient to resume CPAP use as soon as the supplies are provided.  Follow-up in 4 weeks  Essential hypertension Increased amlodipine to 10 mg daily Referral to primary care Patient to continue to monitor blood pressure daily  Obstructive sleep apnea Order placed for CPAP start with a. Patient is to resume CPAP use Follow-up with our office in 4 weeks  Cough Continue Breo Ellipta inhaler Continue rescue inhaler Continue Singulair  Follow-up with our office in 4 weeks  Aggressive cough management: . We believe you have a chronic/cyclical cough that is aggravated by reflux , coughing , and drainage.  . Goal is to not Cough or clear throat.  Marland Kitchen Avoid coughing or clearing throat by using:  o non-mint products/sugarless candy o Water o ice chips o Remember NO MINT PRODUCTS  . Medications to use:  o Mucinex DM 1-2 every 12 hrs or Delsym 2 tsp every 12 hrs f or cough o Tessalon Three times a day  As needed  Cough.  o Prilosec 13m 30 min before breakfast or dinner.  o Zyrtec 125mat bedtime o Chlor tabs 35m65m at bedtime  for nasal drip until cough is 100% cough free.         BriLauraine RinneP 08/11/2017

## 2017-08-11 ENCOUNTER — Ambulatory Visit: Payer: Managed Care, Other (non HMO) | Admitting: Pulmonary Disease

## 2017-08-11 ENCOUNTER — Encounter: Payer: Self-pay | Admitting: Pulmonary Disease

## 2017-08-11 VITALS — BP 168/90 | HR 89 | Ht 67.0 in | Wt 226.0 lb

## 2017-08-11 DIAGNOSIS — R05 Cough: Secondary | ICD-10-CM | POA: Diagnosis not present

## 2017-08-11 DIAGNOSIS — I10 Essential (primary) hypertension: Secondary | ICD-10-CM

## 2017-08-11 DIAGNOSIS — G4733 Obstructive sleep apnea (adult) (pediatric): Secondary | ICD-10-CM | POA: Diagnosis not present

## 2017-08-11 DIAGNOSIS — R059 Cough, unspecified: Secondary | ICD-10-CM

## 2017-08-11 MED ORDER — AMLODIPINE BESYLATE 10 MG PO TABS: ORAL_TABLET | ORAL | 1 refills | Status: DC

## 2017-08-11 MED ORDER — OMEPRAZOLE 20 MG PO CPDR: 20.0000 mg | DELAYED_RELEASE_CAPSULE | Freq: Every day | ORAL | 4 refills | Status: DC

## 2017-08-11 MED ORDER — BENZONATATE 200 MG PO CAPS: 200.0000 mg | ORAL_CAPSULE | Freq: Three times a day (TID) | ORAL | 1 refills | Status: DC | PRN

## 2017-08-11 NOTE — Patient Instructions (Addendum)
Continue Breo Ellipta inhaler Continue rescue inhaler Continue Singulair  Increased amlodipine to 59m daily  >>>Referral to Primary Care for management  Follow-up with our office in 4 weeks  Aggressive cough management: . We believe you have a chronic/cyclical cough that is aggravated by reflux , coughing , and drainage.  . Goal is to not Cough or clear throat.  .Marland KitchenAvoid coughing or clearing throat by using:  o non-mint products/sugarless candy o Water o ice chips o Remember NO MINT PRODUCTS  . Medications to use:  o Mucinex DM 1-2 every 12 hrs or Delsym 2 tsp every 12 hrs f or cough o Tessalon Three times a day  As needed  Cough.  o Prilosec 236m30 min before breakfast or dinner.  o Zyrtec 1041mt bedtime o Chlor tabs 4mg75mat bedtime  for nasal drip until cough is 100% cough free.    Please contact the office if your symptoms worsen or you have concerns that you are not improving.   Thank you for choosing [facility] for your healthcare, and for allowing us [facility] with you on your healthcare journey. I am thankful to be able to provide care to you today.   Isabel Garcia        Gastroesophageal Reflux Disease, Adult Normally, food travels down the esophagus and stays in the stomach to be digested. If a person has gastroesophageal reflux disease (GERD), food and stomach acid move back up into the esophagus. When this happens, the esophagus becomes sore and swollen (inflamed). Over time, GERD can make small holes (ulcers) in the lining of the esophagus. Follow these instructions at home: Diet  Follow a diet as told by your doctor. You may need to avoid foods and drinks such as: ? Coffee and tea (with or without caffeine). ? Drinks that contain alcohol. ? Energy drinks and sports drinks. ? Carbonated drinks or sodas. ? Chocolate and cocoa. ? Peppermint and mint flavorings. ? Garlic and onions. ? Horseradish. ? Spicy and acidic foods, such as peppers,  chili powder, curry powder, vinegar, hot sauces, and BBQ sauce. ? Citrus fruit juices and citrus fruits, such as oranges, lemons, and limes. ? Tomato-based foods, such as red sauce, chili, salsa, and pizza with red sauce. ? Fried and fatty foods, such as donuts, french fries, potato chips, and high-fat dressings. ? High-fat meats, such as hot dogs, rib eye steak, sausage, ham, and bacon. ? High-fat dairy items, such as whole milk, butter, and cream cheese.  Eat small meals often. Avoid eating large meals.  Avoid drinking large amounts of liquid with your meals.  Avoid eating meals during the 2-3 hours before bedtime.  Avoid lying down right after you eat.  Do not exercise right after you eat. General instructions  Pay attention to any changes in your symptoms.  Take over-the-counter and prescription medicines only as told by your doctor. Do not take aspirin, ibuprofen, or other NSAIDs unless your doctor says it is okay.  Do not use any tobacco products, including cigarettes, chewing tobacco, and e-cigarettes. If you need help quitting, ask your doctor.  Wear loose clothes. Do not wear anything tight around your waist.  Raise (elevate) the head of your bed about 6 inches (15 cm).  Try to lower your stress. If you need help doing this, ask your doctor.  If you are overweight, lose an amount of weight that is healthy for you. Ask your doctor about a safe weight loss goal.  Keep all follow-up visits as told by your doctor. This is important. Contact a doctor if:  You have new symptoms.  You lose weight and you do not know why it is happening.  You have trouble swallowing, or it hurts to swallow.  You have wheezing or a cough that keeps happening.  Your symptoms do not get better with treatment.  You have a hoarse voice. Get help right away if:  You have pain in your arms, neck, jaw, teeth, or back.  You feel sweaty, dizzy, or light-headed.  You have chest pain or  shortness of breath.  You throw up (vomit) and your throw up looks like blood or coffee grounds.  You pass out (faint).  Your poop (stool) is bloody or black.  You cannot swallow, drink, or eat. This information is not intended to replace advice given to you by your health care provider. Make sure you discuss any questions you have with your health care provider. Document Released: 07/09/2007 Document Revised: 06/28/2015 Document Reviewed: 05/17/2014 Elsevier Interactive Patient Education  2018 [employer] for Gastroesophageal Reflux Disease, Adult When you have gastroesophageal reflux disease (GERD), the foods you eat and your eating habits are very important. Choosing the right foods can help ease your discomfort. What guidelines do I need to follow?  Choose fruits, vegetables, whole grains, and low-fat dairy products.  Choose low-fat meat, fish, and poultry.  Limit fats such as oils, salad dressings, butter, nuts, and avocado.  Keep a food diary. This helps you identify foods that cause symptoms.  Avoid foods that cause symptoms. These may be different for everyone.  Eat small meals often instead of 3 large meals a day.  Eat your meals slowly, in a place where you are relaxed.  Limit fried foods.  Cook foods using methods other than frying.  Avoid drinking alcohol.  Avoid drinking large amounts of liquids with your meals.  Avoid bending over or lying down until 2-3 hours after eating. What foods are not recommended? These are some foods and drinks that may make your symptoms worse: Vegetables Tomatoes. Tomato juice. Tomato and spaghetti sauce. Chili peppers. Onion and garlic. Horseradish. Fruits Oranges, grapefruit, and lemon (fruit and juice). Meats High-fat meats, fish, and poultry. This includes hot dogs, ribs, ham, sausage, salami, and bacon. Dairy Whole milk and chocolate milk. Sour cream. Cream. Butter. Ice cream. Cream  cheese. Drinks Coffee and tea. Bubbly (carbonated) drinks or energy drinks. Condiments Hot sauce. Barbecue sauce. Sweets/Desserts Chocolate and cocoa. Donuts. Peppermint and spearmint. Fats and Oils High-fat foods. This includes [place] fries and potato chips. Other Vinegar. Strong spices. This includes black pepper, white pepper, red pepper, cayenne, curry powder, cloves, ginger, and chili powder. The items listed above may not be a complete list of foods and drinks to avoid. Contact your dietitian for more information. This information is not intended to replace advice given to you by your health care provider. Make sure you discuss any questions you have with your health care provider. Document Released: 07/22/2011 Document Revised: 06/28/2015 Document Reviewed: 11/24/2012 Elsevier Interactive Patient Education  2017 [employer].

## 2017-08-11 NOTE — Assessment & Plan Note (Signed)
Increased amlodipine to 10 mg daily Referral to primary care Patient to continue to monitor blood pressure daily

## 2017-08-11 NOTE — Assessment & Plan Note (Signed)
Order placed for CPAP start with a. Patient is to resume CPAP use Follow-up with our office in 4 weeks

## 2017-08-11 NOTE — Assessment & Plan Note (Signed)
Continue Breo Ellipta inhaler Continue rescue inhaler Continue Singulair  Follow-up with our office in 4 weeks  Aggressive cough management: . We believe you have a chronic/cyclical cough that is aggravated by reflux , coughing , and drainage.  . Goal is to not Cough or clear throat.  Marland Kitchen Avoid coughing or clearing throat by using:  o non-mint products/sugarless candy o Water o ice chips o Remember NO MINT PRODUCTS  . Medications to use:  o Mucinex DM 1-2 every 12 hrs or Delsym 2 tsp every 12 hrs f or cough o Tessalon Three times a day  As needed  Cough.  o Prilosec 19m 30 min before breakfast or dinner.  o Zyrtec 153mat bedtime o Chlor tabs 92m21m at bedtime  for nasal drip until cough is 100% cough free.

## 2017-08-11 NOTE — Progress Notes (Signed)
Reviewed and agree with assessment/plan.   Tamika Shropshire, MD [facility] 01/30/2016, 12:24 PM Pager:  [phone]  

## 2017-08-12 ENCOUNTER — Telehealth: Payer: Self-pay | Admitting: Pulmonary Disease

## 2017-08-12 NOTE — Telephone Encounter (Signed)
Patient dropped of disability form to be completed at her visit yesterday. I have fwd form to [employer] via interoffice mail for processing -pr

## 2017-08-24 ENCOUNTER — Telehealth: Payer: Self-pay | Admitting: Pulmonary Disease

## 2017-08-24 NOTE — Telephone Encounter (Signed)
Patient brought insurance papers into office needing to be filled out at last appt. Medical records request has been faxed twice requesting records. Left Message with [facility] inquiring as to where we were in the process as far as the patients records request. Phoned patient and made her aware the records request has been faxed and we are awaiting a response. Nothing further needed at this time.

## 2017-09-08 ENCOUNTER — Ambulatory Visit: Payer: Managed Care, Other (non HMO) | Admitting: Pulmonary Disease

## 2017-09-08 ENCOUNTER — Ambulatory Visit (INDEPENDENT_AMBULATORY_CARE_PROVIDER_SITE_OTHER): Payer: Managed Care, Other (non HMO) | Admitting: Pulmonary Disease

## 2017-09-08 ENCOUNTER — Encounter: Payer: Self-pay | Admitting: Pulmonary Disease

## 2017-09-08 VITALS — BP 128/84 | HR 63 | Ht 67.0 in | Wt 220.2 lb

## 2017-09-08 DIAGNOSIS — I10 Essential (primary) hypertension: Secondary | ICD-10-CM

## 2017-09-08 DIAGNOSIS — K219 Gastro-esophageal reflux disease without esophagitis: Secondary | ICD-10-CM

## 2017-09-08 DIAGNOSIS — R059 Cough, unspecified: Secondary | ICD-10-CM

## 2017-09-08 DIAGNOSIS — R05 Cough: Secondary | ICD-10-CM

## 2017-09-08 DIAGNOSIS — G4733 Obstructive sleep apnea (adult) (pediatric): Secondary | ICD-10-CM | POA: Diagnosis not present

## 2017-09-08 LAB — PULMONARY FUNCTION TEST
DL/VA % PRED: 92 %
DL/VA: 4.7 ml/min/mmHg/L
DLCO UNC % PRED: 70 %
DLCO unc: 19.56 ml/min/mmHg
FEF 25-75 POST: 2.65 L/s
FEF 25-75 PRE: 2.5 L/s
FEF2575-%Change-Post: 6 %
FEF2575-%Pred-Post: 117 %
FEF2575-%Pred-Pre: 111 %
FEV1-%CHANGE-POST: 1 %
FEV1-%PRED-PRE: 93 %
FEV1-%Pred-Post: 94 %
FEV1-POST: 2.2 L
FEV1-Pre: 2.16 L
FEV1FVC-%Change-Post: 5 %
FEV1FVC-%PRED-PRE: 106 %
FEV6-%CHANGE-POST: -2 %
FEV6-%PRED-POST: 86 %
FEV6-%Pred-Pre: 89 %
FEV6-POST: 2.47 L
FEV6-Pre: 2.55 L
FEV6FVC-%Change-Post: 0 %
FEV6FVC-%PRED-POST: 103 %
FEV6FVC-%Pred-Pre: 102 %
FVC-%Change-Post: -3 %
FVC-%Pred-Post: 84 %
FVC-%Pred-Pre: 87 %
FVC-Post: 2.48 L
FVC-Pre: 2.56 L
PRE FEV1/FVC RATIO: 84 %
PRE FEV6/FVC RATIO: 99 %
Post FEV1/FVC ratio: 89 %
Post FEV6/FVC ratio: 100 %
RV % PRED: 94 %
RV: 2 L
TLC % pred: 84 %
TLC: 4.58 L

## 2017-09-08 MED ORDER — FLUTTER DEVI: 0 refills | Status: DC

## 2017-09-08 MED ORDER — FAMOTIDINE 20 MG PO TABS: 20.0000 mg | ORAL_TABLET | Freq: Two times a day (BID) | ORAL | 0 refills | Status: DC

## 2017-09-08 NOTE — Progress Notes (Signed)
Discussed results with patient in office.  Nothing further is needed at this time.  Geronimo Diliberto FNP  

## 2017-09-08 NOTE — Assessment & Plan Note (Addendum)
Continue tessalon as needed  Continue omeprazole  Add pepcid   pfts today

## 2017-09-08 NOTE — Progress Notes (Signed)
PFT done today. 

## 2017-09-08 NOTE — Assessment & Plan Note (Signed)
Keep up the hard work using your device.   Remember:  . Do not drive or operate heavy machinery if tired or drowsy.  . Please notify the supply company and office if you are unable to use your device regularly due to missing supplies or machine being broken.  . Work on maintaining a healthy weight and following your recommended nutrition plan  . Maintain proper daily exercise and movement  . Maintaining proper use of your device can also help improve management of other chronic illnesses such as: Blood pressure, blood sugars, and weight management.   CPAP Cleaning:  Clean weekly, with Dajiah soap, and bottle brush.  Set up to air dry.

## 2017-09-08 NOTE — Assessment & Plan Note (Signed)
Continue omeprazole 43m daily  Will add pepcid 276mat night  Follow gerd diet  Pt education provided today

## 2017-09-08 NOTE — Progress Notes (Addendum)
@Patient  ID: Isabel Garcia, female    DOB: 09/20/1955, 62 y.o.   MRN: [id number]  Chief Complaint  Patient presents with  . Follow-up    Cough / OSA follow up     Referring provider: No ref. provider found  HPI: 62 year old female patient followed in our office for obstructive sleep apnea and asthma. 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 Patient of Dr. Halford Chessman  Recent [facility] Encounters:   07/23/2017-initial office visit-Isabel Garcia Plan: Get records of last PFT/sleep study, CPAP 10, continue Breo, order echocardiogram  08/11/2017-office visit-BM Pleasant 62 year old patient seen office visit today for 2-week follow-up.  Patient completed echocardiogram which was unremarkable.  Patient is continued Breo Ellipta inhaler.  Patient adherent to Singulair.  We are still waiting on results from her PFTs in [place].  We have not achieve these records yet. Plan: We will treat cough aggressively with GERD management, add Tessalon, attempted to do [place] patient was unable to complete, patient needs to resume CPAP, increased amlodipine to 10 mg daily referred to primary care.  Tests:  HST 04/24/17 >> AHI 29.9, SpO2 low 67% CPAP 07/04/17 >> CPAP 10 cm H2O   Imaging:  CT angio chest 03/01/17 >> b/l PE, patchy basilar GGO CT chest 07/15/17 >> small PE LLL segmental artery  Cardiac:  Doppler legs 03/01/17 >> nonocclusive DVT Rt popliteal vein Echo 07/29/17 >> EF 65 to 70%, grade 1 DD  Labs:   Micro:   Chart Review:      09/08/17 OV  62 year old patient seen for follow-up today.  Patient reports since last being seen office she is feeling slightly better.  And cough is much more controlled.  Patient reports she is been taking her omeprazole 20 mg daily.  As well as been working to be more adherent to GERD diet.  Patient does report she is noticed that when she eats spicy food as well as anything involving tomatoes she has increased cough and epigastric/chest pain.  Patient thinks this is related to  her GERD.  Patient reports adherence to CPAP.  But has not been a full month.  Patient reports she is been wearing it for about 4 to 5 hours at night.  Missed to work to be at use of this.  Patient reports most nights she wakes up in the mask is disconnected and in the bed with her.  But the machine is saying that she is wearing it for about 4 to 5 hours.  We still have not received any records from her prior pulmonary function testing and doctors in [place].  Patient is requesting [place] to fill out paperwork for disability.  Patient reports that short-term disability has run out.  Patient is still not been established with primary care.  Primary care referral that we placed that last office visit has come back with multiple rejections for [facility] excepting patients.  We will try to reestablish patient with PCP in [place].  Patient needs primary care provider to manage comorbidities as well as to manage multiple referrals to pain specialist,  Neurosurgery/ pain management.   No Known Allergies  Immunization History  Administered Date(s) Administered  . Influenza Split 11/22/2016  >>> pt thinks she has received pneumonia vaccine before, unsure on dates  Past Medical History:  Diagnosis Date  . Asthma   . Diverticulosis   . Hypertension   . Liver cirrhosis secondary to NASH (nonalcoholic steatohepatitis) ([place])     Tobacco History: Social History   Tobacco Use  Smoking  Status Never Smoker  Smokeless Tobacco Never Used   Counseling given: Yes Continue not smoking  Outpatient Encounter Medications as of 09/08/2017  Medication Sig  . albuterol (PROVENTIL HFA) 108 (90 Base) MCG/ACT inhaler Inhale 2 puffs into the lungs every 6 (six) hours as needed.  Marland Kitchen albuterol (PROVENTIL HFA;VENTOLIN HFA) 108 (90 Base) MCG/ACT inhaler Inhale 1-2 puffs into the lungs every 6 (six) hours as needed for wheezing or shortness of breath.  Marland Kitchen amLODipine (NORVASC) 10 MG tablet Take 1 tablet (10 mg  total) daily for high blood pressure.  Marland Kitchen apixaban (ELIQUIS) 5 MG TABS tablet Take 5 mg by mouth 2 (two) times daily.  . benzonatate (TESSALON) 200 MG capsule Take 1 capsule (200 mg total) by mouth 3 (three) times daily as needed for cough.  . fluticasone furoate-vilanterol (BREO ELLIPTA) 100-25 MCG/INH AEPB Inhale 1 puff into the lungs daily.  Marland Kitchen loperamide (IMODIUM) 2 MG capsule Take 1 capsule (2 mg total) by mouth 4 (four) times daily as needed for diarrhea or loose stools.  . metoprolol succinate (TOPROL-XL) 25 MG 24 hr tablet Take 25 mg by mouth every morning.  . montelukast (SINGULAIR) 10 MG tablet Take 1 tablet (10 mg total) by mouth at bedtime.  . montelukast (SINGULAIR) 10 MG tablet Take 1 tablet (10 mg total) by mouth at bedtime.  Marland Kitchen omeprazole (PRILOSEC) 20 MG capsule Take 1 capsule (20 mg total) by mouth daily.  . ondansetron (ZOFRAN ODT) 4 MG disintegrating tablet Take 1 tablet (4 mg total) by mouth every 8 (eight) hours as needed for nausea or vomiting.  . prednisoLONE (ORAPRED) 15 MG/5ML solution Take 30 mg by mouth daily before breakfast.  . famotidine (PEPCID) 20 MG tablet Take 1 tablet (20 mg total) by mouth 2 (two) times daily.  Marland Kitchen Respiratory Therapy Supplies (FLUTTER) DEVI Use as directed  . [DISCONTINUED] Respiratory Therapy Supplies (FLUTTER) DEVI Use as directed  . [DISCONTINUED] Respiratory Therapy Supplies (FLUTTER) DEVI Use as directed   No facility-administered encounter medications on file as of 09/08/2017.      Review of Systems  Constitutional: +fatigue    No  weight loss, night sweats,  fevers, chills HEENT:   No headaches,  Difficulty swallowing,  Tooth/dental problems, or  Sore throat, No sneezing, itching, ear ache, nasal congestion, post nasal drip  CV: No chest pain,  orthopnea, PND, swelling in lower extremities, anasarca, dizziness, palpitations, syncope  GI: +epigastric pain   No heartburn, indigestion, abdominal pain, nausea, vomiting, diarrhea, change in  bowel habits, loss of appetite, bloody stools Resp: +improved cough  No shortness of breath with exertion or at rest.  No excess mucus,   No coughing up of blood.  No change in color of mucus.  No wheezing.  No chest wall deformity Skin: no rash, lesions, no skin changes. GU: no dysuria, change in color of urine, no urgency or frequency.  No flank pain, no hematuria  MS:  No joint pain or swelling.  No decreased range of motion.  No back pain. Psych:  No change in mood or affect. No depression or anxiety.  No memory loss.      Physical Exam  BP 128/84   Pulse 63   Ht 5' 7"  (1.702 m)   Wt 220 lb 3.2 oz (99.9 kg)   SpO2 99%   BMI 34.49 kg/m   Wt Readings from Last 5 Encounters:  09/08/17 220 lb 3.2 oz (99.9 kg)  08/11/17 226 lb (102.5 kg)  07/23/17 222 lb (  100.7 kg)  02/18/16 225 lb 9.6 oz (102.3 kg)     Physical Exam  Constitutional: She is oriented to person, place, and time and well-developed, well-nourished, and in no distress. Vital signs are normal. No distress.  HENT:  Head: Normocephalic and atraumatic.  Right Ear: Hearing, tympanic membrane, external ear and ear canal normal.  Left Ear: Hearing, tympanic membrane, external ear and ear canal normal.  Mouth/Throat: Uvula is midline and oropharynx is clear and moist. No oropharyngeal exudate.  Eyes: Pupils are equal, round, and reactive to light.  Neck: Normal range of motion. Neck supple. No JVD present.  Cardiovascular: Normal rate, regular rhythm and normal heart sounds.  Pulmonary/Chest: Effort normal and breath sounds normal. No accessory muscle usage. No respiratory distress. She has no decreased breath sounds. She has no wheezes. She has no rhonchi.  Abdominal: Soft. Bowel sounds are normal. There is no tenderness.  Musculoskeletal: Normal range of motion. She exhibits edema (trace le bilateral edema).  Ambulates in room   Lymphadenopathy:    She has no cervical adenopathy.  Neurological: She is alert and  oriented to person, place, and time. Gait normal.  Skin: Skin is warm and dry. She is not diaphoretic. No erythema.  Psychiatric: Mood, memory, affect and judgment normal.  Nursing note and vitals reviewed.    Lab Results:  CBC    Component Value Date/Time   WBC 11.7 (H) 02/18/2016 0059   RBC 4.78 02/18/2016 0059   HGB 14.7 02/18/2016 0059   HCT 41.5 02/18/2016 0059   PLT 322 02/18/2016 0059   MCV 86.8 02/18/2016 0059   MCH 30.8 02/18/2016 0059   MCHC 35.4 02/18/2016 0059   RDW 13.6 02/18/2016 0059    BMET    Component Value Date/Time   NA 133 (L) 02/18/2016 0059   K 3.0 (L) 02/18/2016 0059   CL 96 (L) 02/18/2016 0059   CO2 27 02/18/2016 0059   GLUCOSE 99 02/18/2016 0059   BUN 21 (H) 02/18/2016 0059   CREATININE 0.97 02/18/2016 0059   CALCIUM 8.9 02/18/2016 0059   GFRNONAA >60 02/18/2016 0059   GFRAA >60 02/18/2016 0059    BNP No results found for: BNP  ProBNP No results found for: PROBNP  Imaging: No results found.   Assessment & Plan:   Pleasant 62 year old patient seen today for follow-up visit.  Patient reports she has been using her CPAP.  But as we discussed she needs to increase use as well as to ensure that she is using CPAP for over 4 hours consistently at night.  Patient to follow-up with former primary care and check records to ensure she is had the pneumonia vaccine if not that she needs to be vaccinated.  She also will need a flu vaccine when they become available this year  Unfortunately, with no records being sent to [place] into difficulty document her previous pulmonary function testing.  I offered to the patient to repeat the pulmonary function testing today.  Patient agrees she will be scheduled to complete pulmonary function testing in our office.  Also discussed with the patient extensively that she needs to follow-up with primary care and ensure that she has a primary care provider.  She needs referrals to neurosurgery/pain management for L4-L5  back pain.  Him a little confused on what her disability is and how we can manage this.  As it feels like it is more of back pain related versus breathing.  Will readdress after completing pulmonary function testing.  Obstructive sleep apnea Keep up the hard work using your device.   Remember:  . Do not drive or operate heavy machinery if tired or drowsy.  . Please notify the supply company and office if you are unable to use your device regularly due to missing supplies or machine being broken.  . Work on maintaining a healthy weight and following your recommended nutrition plan  . Maintain proper daily exercise and movement  . Maintaining proper use of your device can also help improve management of other chronic illnesses such as: Blood pressure, blood sugars, and weight management.   CPAP Cleaning:  Clean weekly, with [place] soap, and bottle brush.  Set up to air dry.    Cough Continue tessalon as needed  Continue omeprazole  Add pepcid   pfts today   GERD (gastroesophageal reflux disease) Continue omeprazole 25m daily  Will add pepcid 229mat night  Follow gerd diet  Pt education provided today   Essential hypertension Continue amlodipine  Replaced referral for PCP      BrLauraine RinneNP 09/08/2017  Addendum Pulmonary function test completed on 09/08/2017 >>>Ratio 84, FEV1 93, FVC 83%, no significant bronchodilator response, DLCO 70.  Discussed with patient results briefly after completion.  Patient was requesting a signed letter saying that she needs to be written out of work as her short-term disability has expired.  I explained to patient there is nothing on the pulmonary function test or on her assessment to warrant being signed out of work indefinitely.  I provided a note explaining that we have seen her these office visits as well as done a pulmonary function test work-up.  I fear that with her disability forms they probably need to be completed by chronic pain or  back specialist has patient reports that she cannot walk or stand for periods of time due to her lower back pain.  I do not see explicit concerns for pulmonary embolism or DVT at this time based off of assessment.  I explained this to the patient.  Patient agrees.  Patient to keep follow-up appointment.  BrWyn QuakerNP-C  [facility]

## 2017-09-08 NOTE — Patient Instructions (Addendum)
Pulmonary function test today   We will follow-up on referral to primary care  Continue CPAP use >>> Goal to use this nightly, at least 4 hours  Follow-up with our office in 6 to 8 weeks  Will add Pepcid 20 mg at night Continue omeprazole 20 mg in the morning Review GERD literature listed below, strongly encourage you to follow a GERD diet listed below  Double check your records and follow-up on your pneumonia vaccine status  Flu vaccine when they become available in September/2019  Please contact the office if your symptoms worsen or you have concerns that you are not improving.   Thank you for choosing [facility] for your healthcare, and for allowing [place] to partner with you on your healthcare journey. I am thankful to be able to provide care to you today.   Isabel Quaker FNP-C                  Gastroesophageal Reflux Disease, Adult Normally, food travels down the esophagus and stays in the stomach to be digested. If a person has gastroesophageal reflux disease (GERD), food and stomach acid move back up into the esophagus. When this happens, the esophagus becomes sore and swollen (inflamed). Over time, GERD can make small holes (ulcers) in the lining of the esophagus. Follow these instructions at home: Diet  Follow a diet as told by your doctor. You may need to avoid foods and drinks such as: ? Coffee and tea (with or without caffeine). ? Drinks that contain alcohol. ? Energy drinks and sports drinks. ? Carbonated drinks or sodas. ? Chocolate and cocoa. ? Peppermint and mint flavorings. ? Garlic and onions. ? Horseradish. ? Spicy and acidic foods, such as peppers, chili powder, curry powder, vinegar, hot sauces, and BBQ sauce. ? Citrus fruit juices and citrus fruits, such as oranges, lemons, and limes. ? Tomato-based foods, such as red sauce, chili, salsa, and pizza with red sauce. ? Fried and fatty foods, such as donuts, french fries, potato chips,  and high-fat dressings. ? High-fat meats, such as hot dogs, rib eye steak, sausage, ham, and bacon. ? High-fat dairy items, such as whole milk, butter, and cream cheese.  Eat small meals often. Avoid eating large meals.  Avoid drinking large amounts of liquid with your meals.  Avoid eating meals during the 2-3 hours before bedtime.  Avoid lying down right after you eat.  Do not exercise right after you eat. General instructions  Pay attention to any changes in your symptoms.  Take over-the-counter and prescription medicines only as told by your doctor. Do not take aspirin, ibuprofen, or other NSAIDs unless your doctor says it is okay.  Do not use any tobacco products, including cigarettes, chewing tobacco, and e-cigarettes. If you need help quitting, ask your doctor.  Wear loose clothes. Do not wear anything tight around your waist.  Raise (elevate) the head of your bed about 6 inches (15 cm).  Try to lower your stress. If you need help doing this, ask your doctor.  If you are overweight, lose an amount of weight that is healthy for you. Ask your doctor about a safe weight loss goal.  Keep all follow-up visits as told by your doctor. This is important. Contact a doctor if:  You have new symptoms.  You lose weight and you do not know why it is happening.  You have trouble swallowing, or it hurts to swallow.  You have wheezing or a cough that keeps happening.  Your symptoms do not get better with treatment.  You have a hoarse voice. Get help right away if:  You have pain in your arms, neck, jaw, teeth, or back.  You feel sweaty, dizzy, or light-headed.  You have chest pain or shortness of breath.  You throw up (vomit) and your throw up looks like blood or coffee grounds.  You pass out (faint).  Your poop (stool) is bloody or black.  You cannot swallow, drink, or eat. This information is not intended to replace advice given to you by your health care provider.  Make sure you discuss any questions you have with your health care provider. Document Released: 07/09/2007 Document Revised: 06/28/2015 Document Reviewed: 05/17/2014 Elsevier Interactive Patient Education  2018 [employer] for Gastroesophageal Reflux Disease, Adult When you have gastroesophageal reflux disease (GERD), the foods you eat and your eating habits are very important. Choosing the right foods can help ease your discomfort. What guidelines do I need to follow?  Choose fruits, vegetables, whole grains, and low-fat dairy products.  Choose low-fat meat, fish, and poultry.  Limit fats such as oils, salad dressings, butter, nuts, and avocado.  Keep a food diary. This helps you identify foods that cause symptoms.  Avoid foods that cause symptoms. These may be different for everyone.  Eat small meals often instead of 3 large meals a day.  Eat your meals slowly, in a place where you are relaxed.  Limit fried foods.  Cook foods using methods other than frying.  Avoid drinking alcohol.  Avoid drinking large amounts of liquids with your meals.  Avoid bending over or lying down until 2-3 hours after eating. What foods are not recommended? These are some foods and drinks that may make your symptoms worse: Vegetables Tomatoes. Tomato juice. Tomato and spaghetti sauce. Chili peppers. Onion and garlic. Horseradish. Fruits Oranges, grapefruit, and lemon (fruit and juice). Meats High-fat meats, fish, and poultry. This includes hot dogs, ribs, ham, sausage, salami, and bacon. Dairy Whole milk and chocolate milk. Sour cream. Cream. Butter. Ice cream. Cream cheese. Drinks Coffee and tea. Bubbly (carbonated) drinks or energy drinks. Condiments Hot sauce. Barbecue sauce. Sweets/Desserts Chocolate and cocoa. Donuts. Peppermint and spearmint. Fats and Oils High-fat foods. This includes [place] fries and potato chips. Other Vinegar. Strong spices. This includes black  pepper, white pepper, red pepper, cayenne, curry powder, cloves, ginger, and chili powder. The items listed above may not be a complete list of foods and drinks to avoid. Contact your dietitian for more information. This information is not intended to replace advice given to you by your health care provider. Make sure you discuss any questions you have with your health care provider. Document Released: 07/22/2011 Document Revised: 06/28/2015 Document Reviewed: 11/24/2012 Elsevier Interactive Patient Education  2017 [employer].

## 2017-09-08 NOTE — Assessment & Plan Note (Signed)
Continue amlodipine  Replaced referral for PCP

## 2017-10-20 ENCOUNTER — Encounter: Payer: Self-pay | Admitting: Pulmonary Disease

## 2017-10-20 ENCOUNTER — Ambulatory Visit (INDEPENDENT_AMBULATORY_CARE_PROVIDER_SITE_OTHER): Payer: Managed Care, Other (non HMO) | Admitting: Pulmonary Disease

## 2017-10-20 VITALS — BP 150/92 | HR 77 | Ht 66.0 in | Wt 221.6 lb

## 2017-10-20 DIAGNOSIS — Z86711 Personal history of pulmonary embolism: Secondary | ICD-10-CM | POA: Insufficient documentation

## 2017-10-20 DIAGNOSIS — I10 Essential (primary) hypertension: Secondary | ICD-10-CM | POA: Diagnosis not present

## 2017-10-20 DIAGNOSIS — Z23 Encounter for immunization: Secondary | ICD-10-CM | POA: Diagnosis not present

## 2017-10-20 DIAGNOSIS — K219 Gastro-esophageal reflux disease without esophagitis: Secondary | ICD-10-CM | POA: Diagnosis not present

## 2017-10-20 DIAGNOSIS — R05 Cough: Secondary | ICD-10-CM | POA: Diagnosis not present

## 2017-10-20 DIAGNOSIS — Z0000 Encounter for general adult medical examination without abnormal findings: Secondary | ICD-10-CM | POA: Insufficient documentation

## 2017-10-20 DIAGNOSIS — G4733 Obstructive sleep apnea (adult) (pediatric): Secondary | ICD-10-CM

## 2017-10-20 DIAGNOSIS — R059 Cough, unspecified: Secondary | ICD-10-CM

## 2017-10-20 MED ORDER — APIXABAN 5 MG PO TABS
5.0000 mg | ORAL_TABLET | Freq: Two times a day (BID) | ORAL | 3 refills | Status: DC
Start: 2017-10-20 — End: 2018-05-11

## 2017-10-20 NOTE — Assessment & Plan Note (Signed)
Continue Eliquis  >>>we will refill this today

## 2017-10-20 NOTE — Patient Instructions (Addendum)
Continue Eliquis we will refill this  Regular flu vaccine today Pneumovax 23 today   We recommend that you continue using your CPAP daily >>>Keep up the hard work using your device >>> Goal should be wearing this for the entire night that you are sleeping, at least 4 to 6 hours  Remember:  . Do not drive or operate heavy machinery if tired or drowsy.  . Please notify the supply company and office if you are unable to use your device regularly due to missing supplies or machine being broken.  . Work on maintaining a healthy weight and following your recommended nutrition plan  . Maintain proper daily exercise and movement  . Maintaining proper use of your device can also help improve management of other chronic illnesses such as: Blood pressure, blood sugars, and weight management.   BiPAP/ CPAP Cleaning:  >>>Clean weekly, with Kerrianne soap, and bottle brush.  Set up to air dry.   Continue follow-up with primary care  Continue omeprazole 20 mg in the morning Continue Pepcid 20 mg at night Review GERD literature listed below  Discussed with primary care blood pressure, GERD, potential referral to nutritionist, referral to pain management    Follow-up in 3 months   It is flu season:   >>>Remember to be washing your hands regularly, using hand sanitizer, be careful to use around herself with has contact with people who are sick will increase her chances of getting sick yourself. >>> Best ways to protect herself from the flu: Receive the yearly flu vaccine, practice good hand hygiene washing with soap and also using hand sanitizer when available, eat a nutritious meals, get adequate rest, hydrate appropriately   Please contact the office if your symptoms worsen or you have concerns that you are not improving.   Thank you for choosing [facility] for your healthcare, and for allowing [place] to partner with you on your healthcare journey. I am thankful to be able to provide care  to you today.   Wyn Quaker FNP-C    Low-Sodium Eating Plan Sodium, which is an element that makes up salt, helps you maintain a healthy balance of fluids in your body. Too much sodium can increase your blood pressure and cause fluid and waste to be held in your body. Your health care provider or dietitian may recommend following this plan if you have high blood pressure (hypertension), kidney disease, liver disease, or heart failure. Eating less sodium can help lower your blood pressure, reduce swelling, and protect your heart, liver, and kidneys. What are tips for following this plan? General guidelines  Most people on this plan should limit their sodium intake to 1,500-2,000 mg (milligrams) of sodium each day. Reading food labels  The Nutrition Facts label lists the amount of sodium in one serving of the food. If you eat more than one serving, you must multiply the listed amount of sodium by the number of servings.  Choose foods with less than 140 mg of sodium per serving.  Avoid foods with 300 mg of sodium or more per serving. Shopping  Look for lower-sodium products, often labeled as "low-sodium" or "no salt added."  Always check the sodium content even if foods are labeled as "unsalted" or "no salt added".  Buy fresh foods. ? Avoid canned foods and premade or frozen meals. ? Avoid canned, cured, or processed meats  Buy breads that have less than 80 mg of sodium per slice. Cooking  Eat more home-cooked food and less  restaurant, buffet, and fast food.  Avoid adding salt when cooking. Use salt-free seasonings or herbs instead of table salt or sea salt. Check with your health care provider or pharmacist before using salt substitutes.  Cook with plant-based oils, such as canola, sunflower, or olive oil. Meal planning  When eating at a restaurant, ask that your food be prepared with less salt or no salt, if possible.  Avoid foods that contain MSG (monosodium glutamate). MSG  is sometimes added to [place] food, bouillon, and some canned foods. What foods are recommended? The items listed may not be a complete list. Talk with your dietitian about what dietary choices are best for you. Grains Low-sodium cereals, including oats, puffed wheat and rice, and shredded wheat. Low-sodium crackers. Unsalted rice. Unsalted pasta. Low-sodium bread. Whole-grain breads and whole-grain pasta. Vegetables Fresh or frozen vegetables. "No salt added" canned vegetables. "No salt added" tomato sauce and paste. Low-sodium or reduced-sodium tomato and vegetable juice. Fruits Fresh, frozen, or canned fruit. Fruit juice. Meats and other protein foods Fresh or frozen (no salt added) meat, poultry, seafood, and fish. Low-sodium canned tuna and salmon. Unsalted nuts. Dried peas, beans, and lentils without added salt. Unsalted canned beans. Eggs. Unsalted nut butters. Dairy Milk. Soy milk. Cheese that is naturally low in sodium, such as ricotta cheese, fresh mozzarella, or Swiss cheese Low-sodium or reduced-sodium cheese. Cream cheese. Yogurt. Fats and oils Unsalted butter. Unsalted margarine with no trans fat. Vegetable oils such as canola or olive oils. Seasonings and other foods Fresh and dried herbs and spices. Salt-free seasonings. Low-sodium mustard and ketchup. Sodium-free salad dressing. Sodium-free light mayonnaise. Fresh or refrigerated horseradish. Lemon juice. Vinegar. Homemade, reduced-sodium, or low-sodium soups. Unsalted popcorn and pretzels. Low-salt or salt-free chips. What foods are not recommended? The items listed may not be a complete list. Talk with your dietitian about what dietary choices are best for you. Grains Instant hot cereals. Bread stuffing, pancake, and biscuit mixes. Croutons. Seasoned rice or pasta mixes. Noodle soup cups. Boxed or frozen macaroni and cheese. Regular salted crackers. Self-rising flour. Vegetables Sauerkraut, pickled vegetables, and relishes.  Olives. [place] fries. Onion rings. Regular canned vegetables (not low-sodium or reduced-sodium). Regular canned tomato sauce and paste (not low-sodium or reduced-sodium). Regular tomato and vegetable juice (not low-sodium or reduced-sodium). Frozen vegetables in sauces. Meats and other protein foods Meat or fish that is salted, canned, smoked, spiced, or pickled. Bacon, ham, sausage, hotdogs, corned beef, chipped beef, packaged lunch meats, salt pork, jerky, pickled herring, anchovies, regular canned tuna, sardines, salted nuts. Dairy Processed cheese and cheese spreads. Cheese curds. Blue cheese. Feta cheese. String cheese. Regular cottage cheese. Buttermilk. Canned milk. Fats and oils Salted butter. Regular margarine. Ghee. Bacon fat. Seasonings and other foods Onion salt, garlic salt, seasoned salt, table salt, and sea salt. Canned and packaged gravies. Worcestershire sauce. Tartar sauce. Barbecue sauce. Teriyaki sauce. Soy sauce, including reduced-sodium. Steak sauce. Fish sauce. Oyster sauce. Cocktail sauce. Horseradish that you find on the shelf. Regular ketchup and mustard. Meat flavorings and tenderizers. Bouillon cubes. Hot sauce and Tabasco sauce. Premade or packaged marinades. Premade or packaged taco seasonings. Relishes. Regular salad dressings. Salsa. Potato and tortilla chips. Corn chips and puffs. Salted popcorn and pretzels. Canned or dried soups. Pizza. Frozen entrees and pot pies. Summary  Eating less sodium can help lower your blood pressure, reduce swelling, and protect your heart, liver, and kidneys.  Most people on this plan should limit their sodium intake to 1,500-2,000 mg (milligrams) of sodium each  day.  Canned, boxed, and frozen foods are high in sodium. Restaurant foods, fast foods, and pizza are also very high in sodium. You also get sodium by adding salt to food.  Try to cook at home, eat more fresh fruits and vegetables, and eat less fast food, canned, processed, or  prepared foods. This information is not intended to replace advice given to you by your health care provider. Make sure you discuss any questions you have with your health care provider. Document Released: 07/12/2001 Document Revised: 01/14/2016 Document Reviewed: 01/14/2016 Elsevier Interactive Patient Education  2018 [place] for Gastroesophageal Reflux Disease, Adult When you have gastroesophageal reflux disease (GERD), the foods you eat and your eating habits are very important. Choosing the right foods can help ease your discomfort. What guidelines do I need to follow?  Choose fruits, vegetables, whole grains, and low-fat dairy products.  Choose low-fat meat, fish, and poultry.  Limit fats such as oils, salad dressings, butter, nuts, and avocado.  Keep a food diary. This helps you identify foods that cause symptoms.  Avoid foods that cause symptoms. These may be different for everyone.  Eat small meals often instead of 3 large meals a day.  Eat your meals slowly, in a place where you are relaxed.  Limit fried foods.  Cook foods using methods other than frying.  Avoid drinking alcohol.  Avoid drinking large amounts of liquids with your meals.  Avoid bending over or lying down until 2-3 hours after eating. What foods are not recommended? These are some foods and drinks that may make your symptoms worse: Vegetables Tomatoes. Tomato juice. Tomato and spaghetti sauce. Chili peppers. Onion and garlic. Horseradish. Fruits Oranges, grapefruit, and lemon (fruit and juice). Meats High-fat meats, fish, and poultry. This includes hot dogs, ribs, ham, sausage, salami, and bacon. Dairy Whole milk and chocolate milk. Sour cream. Cream. Butter. Ice cream. Cream cheese. Drinks Coffee and tea. Bubbly (carbonated) drinks or energy drinks. Condiments Hot sauce. Barbecue sauce. Sweets/Desserts Chocolate and cocoa. Donuts. Peppermint and spearmint. Fats and  Oils High-fat foods. This includes [place] fries and potato chips. Other Vinegar. Strong spices. This includes black pepper, white pepper, red pepper, cayenne, curry powder, cloves, ginger, and chili powder. The items listed above may not be a complete list of foods and drinks to avoid. Contact your dietitian for more information. This information is not intended to replace advice given to you by your health care provider. Make sure you discuss any questions you have with your health care provider. Document Released: 07/22/2011 Document Revised: 06/28/2015 Document Reviewed: 11/24/2012 Elsevier Interactive Patient Education  2017 [place]. Influenza Virus Vaccine injection What is this medicine? INFLUENZA VIRUS VACCINE (in floo EN zuh VAHY ruhs vak SEEN) helps to reduce the risk of getting influenza also known as the flu. The vaccine only helps protect you against some strains of the flu. This medicine may be used for other purposes; ask your health care provider or pharmacist if you have questions. COMMON BRAND NAME(S): Afluria, Agriflu, Alfuria, FLUAD, Fluarix, Fluarix Quadrivalent, Flublok, Flublok Quadrivalent, FLUCELVAX, Flulaval, Fluvirin, Fluzone, Fluzone High-Dose, Fluzone Intradermal What should I tell my health care provider before I take this medicine? They need to know if you have any of these conditions: -bleeding disorder like hemophilia -fever or infection -Guillain-Barre syndrome or other neurological problems -immune system problems -infection with the human immunodeficiency virus (HIV) or AIDS -low blood platelet counts -multiple sclerosis -an unusual or allergic reaction to influenza virus vaccine,  latex, other medicines, foods, dyes, or preservatives. Different brands of vaccines contain different allergens. Some may contain latex or eggs. Talk to your doctor about your allergies to make sure that you get the right vaccine. -pregnant or trying to get  pregnant -breast-feeding How should I use this medicine? This vaccine is for injection into a muscle or under the skin. It is given by a health care professional. A copy of Vaccine Information Statements will be given before each vaccination. Read this sheet carefully each time. The sheet may change frequently. Talk to your healthcare provider to see which vaccines are right for you. Some vaccines should not be used in all age groups. Overdosage: If you think you have taken too much of this medicine contact a poison control center or emergency room at once. NOTE: This medicine is only for you. Do not share this medicine with others. What if I miss a dose? This does not apply. What may interact with this medicine? -chemotherapy or radiation therapy -medicines that lower your immune system like etanercept, anakinra, infliximab, and adalimumab -medicines that treat or prevent blood clots like warfarin -phenytoin -steroid medicines like prednisone or cortisone -theophylline -vaccines This list may not describe all possible interactions. Give your health care provider a list of all the medicines, herbs, non-prescription drugs, or dietary supplements you use. Also tell them if you smoke, drink alcohol, or use illegal drugs. Some items may interact with your medicine. What should I watch for while using this medicine? Report any side effects that do not go away within 3 days to your doctor or health care professional. Call your health care provider if any unusual symptoms occur within 6 weeks of receiving this vaccine. You may still catch the flu, but the illness is not usually as bad. You cannot get the flu from the vaccine. The vaccine will not protect against colds or other illnesses that may cause fever. The vaccine is needed every year. What side effects may I notice from receiving this medicine? Side effects that you should report to your doctor or health care professional as soon as  possible: -allergic reactions like skin rash, itching or hives, swelling of the face, lips, or tongue Side effects that usually do not require medical attention (report to your doctor or health care professional if they continue or are bothersome): -fever -headache -muscle aches and pains -pain, tenderness, redness, or swelling at the injection site -tiredness This list may not describe all possible side effects. Call your doctor for medical advice about side effects. You may report side effects to FDA at 1-800-FDA-1088. Where should I keep my medicine? The vaccine will be given by a health care professional in a clinic, pharmacy, doctor's office, or other health care setting. You will not be given vaccine doses to store at home. NOTE: This sheet is a summary. It may not cover all possible information. If you have questions about this medicine, talk to your doctor, pharmacist, or health care provider.  2018 Elsevier/Gold Standard (2014-08-11 10:07:28)  Pneumococcal Vaccine, Polyvalent solution for injection What is this medicine? PNEUMOCOCCAL VACCINE, POLYVALENT (NEU mo KOK al vak SEEN, pol ee VEY luhnt) is a vaccine to prevent pneumococcus bacteria infection. These bacteria are a major cause of ear infections, Strep throat infections, and serious pneumonia, meningitis, or blood infections worldwide. These vaccines help the body to produce antibodies (protective substances) that help your body defend against these bacteria. This vaccine is recommended for people 57 years of age and older with  health problems. It is also recommended for all adults over 64 years old. This vaccine will not treat an infection. This medicine may be used for other purposes; ask your health care provider or pharmacist if you have questions. COMMON BRAND NAME(S): Pneumovax 23 What should I tell my health care provider before I take this medicine? They need to know if you have any of these conditions: -bleeding  problems -bone marrow or organ transplant -cancer, Hodgkin's disease -fever -infection -immune system problems -low platelet count in the blood -seizures -an unusual or allergic reaction to pneumococcal vaccine, diphtheria toxoid, other vaccines, latex, other medicines, foods, dyes, or preservatives -pregnant or trying to get pregnant -breast-feeding How should I use this medicine? This vaccine is for injection into a muscle or under the skin. It is given by a health care professional. A copy of Vaccine Information Statements will be given before each vaccination. Read this sheet carefully each time. The sheet may change frequently. Talk to your pediatrician regarding the use of this medicine in children. While this drug may be prescribed for children as young as 71 years of age for selected conditions, precautions do apply. Overdosage: If you think you have taken too much of this medicine contact a poison control center or emergency room at once. NOTE: This medicine is only for you. Do not share this medicine with others. What if I miss a dose? It is important not to miss your dose. Call your doctor or health care professional if you are unable to keep an appointment. What may interact with this medicine? -medicines for cancer chemotherapy -medicines that suppress your immune function -medicines that treat or prevent blood clots like warfarin, enoxaparin, and dalteparin -steroid medicines like prednisone or cortisone This list may not describe all possible interactions. Give your health care provider a list of all the medicines, herbs, non-prescription drugs, or dietary supplements you use. Also tell them if you smoke, drink alcohol, or use illegal drugs. Some items may interact with your medicine. What should I watch for while using this medicine? Mild fever and pain should go away in 3 days or less. Report any unusual symptoms to your doctor or health care professional. What side effects  may I notice from receiving this medicine? Side effects that you should report to your doctor or health care professional as soon as possible: -allergic reactions like skin rash, itching or hives, swelling of the face, lips, or tongue -breathing problems -confused -fever over 102 degrees F -pain, tingling, numbness in the hands or feet -seizures -unusual bleeding or bruising -unusual muscle weakness Side effects that usually do not require medical attention (report to your doctor or health care professional if they continue or are bothersome): -aches and pains -diarrhea -fever of 102 degrees F or less -headache -irritable -loss of appetite -pain, tender at site where injected -trouble sleeping This list may not describe all possible side effects. Call your doctor for medical advice about side effects. You may report side effects to FDA at 1-800-FDA-1088. Where should I keep my medicine? This does not apply. This vaccine is given in a clinic, pharmacy, doctor's office, or other health care setting and will not be stored at home. NOTE: This sheet is a summary. It may not cover all possible information. If you have questions about this medicine, talk to your doctor, pharmacist, or health care provider.  2018 Elsevier/Gold Standard (2007-08-27 14:32:37)

## 2017-10-20 NOTE — Assessment & Plan Note (Signed)
Continue follow-up with primary care  Continue omeprazole 20 mg in the morning Continue Pepcid 20 mg at night Review GERD literature listed below  Discussed with primary care blood pressure, GERD, potential referral to nutritionist, referral to pain management   Follow-up in 3 months

## 2017-10-20 NOTE — Assessment & Plan Note (Addendum)
Breo Ellipta 100 >>> Take 1 puff daily in the morning right when you wake up >>>Rinse your mouth out after use >>>This is a daily maintenance inhaler, NOT a rescue inhaler >>>Contact our office if you are having difficulties affording or obtaining this medication >>>It is important for you to be able to take this daily and not miss any doses  Continue follow-up with primary care  Continue omeprazole 20 mg in the morning Continue Pepcid 20 mg at night Review GERD literature listed below  Continue Singulair   Follow-up in 3 months

## 2017-10-20 NOTE — Assessment & Plan Note (Addendum)
Continue follow-up with primary care  Continue amlodipine   Discussed with primary care blood pressure, GERD, potential referral to nutritionist, referral to pain management   Follow-up in 3 months

## 2017-10-20 NOTE — Progress Notes (Signed)
@Patient  ID: Isabel Garcia, female    DOB: 1956-01-07, 62 y.o.   MRN: [id number]  Chief Complaint  Patient presents with  . Follow-up    OSA / Asthma    Referring provider: No ref. provider found  HPI:  51-year-old female patient followed in our office for obstructive sleep apnea and asthma, patient with past medical history of pulmonary embolism and DVT (on Eliquis)  PMH: GERD, hypertension Smoker/ Smoking History: Never smoker  Maintenance:  Breo 100 Pt of: Dr. Halford Chessman    Recent [facility] Encounters:   09/08/17-OV - BM 62 year old patient following up today.  Patient with persistent symptoms of GERD.  Patient continues to not be adherent to GERD diet.  Patient is requesting help with management of chronic pain.  Patient moved to the area few months ago and has not been established with pain management or neurosurgery.  Patient is also not been established with primary care yet.  Patient would like forms filled out today for disability. Plan: Continue to use CPAP, referral to primary care, add Pepcid, PFTs, but no formal documentation completed for disability as PFTs are normal with reduced DLCO.  10/20/2017  - Visit   62 year old patient presenting today for follow-up visit.  This visit is regarding her asthma as well as further management of her OSA.  Patient reports that shortness of breath is gotten better since taking her Breo 100.   CPAP compliance report today showing 5 out of 30 days use with only 2 of those days greater than 4 hours.  Average usage days 3 hours and 11 minutes.  CPAP setting 10.  AHI 6.2.    Patient reports that she does not like wearing her CPAP even though she knows that she should wear it.  Patient reports "I am a bad patient and I know I should be wearing it but it falls off and I do not like it".  Patient reports improved symptoms with GERD management.  Patient reports adherence to omeprazole and Pepcid.  Unfortunately patient continues to be confused on  appropriate food items to eat for GERD diet.  Patient is requesting help with diet today.  Patient is unsure if she should get the flu vaccine.  Patient also reports she had the pneumonia vaccine many many years ago but is unsure if she should receive it today.   Tests:  HST 04/24/17 >> AHI 29.9, SpO2 low 67% CPAP 07/04/17 >> CPAP 10 cm H2O  09/08/17- Pulmonary function test - Ratio 84, FEV1 93, FVC 83%, no significant bronchodilator response, DLCO 70.  Imaging:  CT angio chest 03/01/17 >> b/l PE, patchy basilar GGO CT chest 07/15/17 >> small PE LLL segmental artery  Cardiac:  Doppler legs 03/01/17 >> nonocclusive DVT Rt popliteal vein Echo 07/29/17 >> EF 65 to 70%, grade 1 DD  Chart Review:     Specialty Problems      Pulmonary Problems   Cough    09/08/17- Pulmonary function test - Ratio 84, FEV1 93, FVC 83%, no significant bronchodilator response, DLCO 70.       Obstructive sleep apnea    HST 04/24/17 >> AHI 29.9, SpO2 low 67% CPAP 07/04/17 >> CPAP 10 cm H2O         No Known Allergies  Immunization History  Administered Date(s) Administered  . Influenza Split 11/22/2016  . Influenza,inj,Quad PF,6+ Mos 10/20/2017  . Pneumococcal Polysaccharide-23 10/20/2017   Flu vaccine today  Pneumovax today   Past Medical History:  Diagnosis Date  .  Asthma   . Diverticulosis   . Hypertension   . Liver cirrhosis secondary to NASH (nonalcoholic steatohepatitis) ([place])     Tobacco History: Social History   Tobacco Use  Smoking Status Never Smoker  Smokeless Tobacco Never Used   Counseling given: Yes  Continue not smoking   Outpatient Encounter Medications as of 10/20/2017  Medication Sig  . albuterol (PROVENTIL HFA) 108 (90 Base) MCG/ACT inhaler Inhale 2 puffs into the lungs every 6 (six) hours as needed.  Marland Kitchen albuterol (PROVENTIL HFA;VENTOLIN HFA) 108 (90 Base) MCG/ACT inhaler Inhale 1-2 puffs into the lungs every 6 (six) hours as needed for wheezing or shortness of breath.    Marland Kitchen amLODipine (NORVASC) 10 MG tablet Take 1 tablet (10 mg total) daily for high blood pressure.  Marland Kitchen apixaban (ELIQUIS) 5 MG TABS tablet Take 1 tablet (5 mg total) by mouth 2 (two) times daily.  . benzonatate (TESSALON) 200 MG capsule Take 1 capsule (200 mg total) by mouth 3 (three) times daily as needed for cough.  . famotidine (PEPCID) 20 MG tablet Take 1 tablet (20 mg total) by mouth 2 (two) times daily.  . fluticasone furoate-vilanterol (BREO ELLIPTA) 100-25 MCG/INH AEPB Inhale 1 puff into the lungs daily.  Marland Kitchen loperamide (IMODIUM) 2 MG capsule Take 1 capsule (2 mg total) by mouth 4 (four) times daily as needed for diarrhea or loose stools.  . metoprolol succinate (TOPROL-XL) 25 MG 24 hr tablet Take 25 mg by mouth every morning.  . montelukast (SINGULAIR) 10 MG tablet Take 1 tablet (10 mg total) by mouth at bedtime.  . montelukast (SINGULAIR) 10 MG tablet Take 1 tablet (10 mg total) by mouth at bedtime.  Marland Kitchen omeprazole (PRILOSEC) 20 MG capsule Take 1 capsule (20 mg total) by mouth daily.  . ondansetron (ZOFRAN ODT) 4 MG disintegrating tablet Take 1 tablet (4 mg total) by mouth every 8 (eight) hours as needed for nausea or vomiting.  . prednisoLONE (ORAPRED) 15 MG/5ML solution Take 30 mg by mouth daily before breakfast.  . Respiratory Therapy Supplies (FLUTTER) DEVI Use as directed  . [DISCONTINUED] apixaban (ELIQUIS) 5 MG TABS tablet Take 5 mg by mouth 2 (two) times daily.   No facility-administered encounter medications on file as of 10/20/2017.     Review of Systems  Review of Systems  Constitutional: Positive for fatigue. Negative for chills, fever and unexpected weight change.  HENT: Negative for congestion, ear pain, postnasal drip, sinus pressure and sinus pain.   Respiratory: Positive for cough (dry cough ) and shortness of breath (improved with breo 100 ). Negative for chest tightness and wheezing.   Cardiovascular: Negative for chest pain and palpitations.  Gastrointestinal: Negative  for blood in stool, diarrhea, nausea and vomiting.       Sometimes epigastric pain  Genitourinary: Negative for dysuria, frequency and urgency.  Musculoskeletal: Negative for arthralgias.  Skin: Negative for color change.  Allergic/Immunologic: Negative for environmental allergies and food allergies.  Neurological: Positive for weakness, numbness (low extremity ) and headaches. Negative for dizziness and light-headedness.  Psychiatric/Behavioral: Positive for sleep disturbance. Negative for dysphoric mood. The patient is nervous/anxious.   All other systems reviewed and are negative.   Physical Exam  BP (!) 150/92 (BP Location: Left Arm, Cuff Size: Normal)   Pulse 77   Ht 5' 6"  (1.676 m)   Wt 221 lb 9.6 oz (100.5 kg)   SpO2 99%   BMI 35.77 kg/m   >>> will discuss htn with pcp this week  Wt Readings from Last 5 Encounters:  10/20/17 221 lb 9.6 oz (100.5 kg)  09/08/17 220 lb 3.2 oz (99.9 kg)  08/11/17 226 lb (102.5 kg)  07/23/17 222 lb (100.7 kg)  02/18/16 225 lb 9.6 oz (102.3 kg)    Physical Exam  Constitutional: She is oriented to person, place, and time and well-developed, well-nourished, and in no distress. No distress.  HENT:  Head: Normocephalic and atraumatic.  Right Ear: Hearing, tympanic membrane, external ear and ear canal normal.  Left Ear: Hearing, tympanic membrane, external ear and ear canal normal.  Nose: Right sinus exhibits frontal sinus tenderness. Right sinus exhibits no maxillary sinus tenderness. Left sinus exhibits frontal sinus tenderness. Left sinus exhibits no maxillary sinus tenderness.  Mouth/Throat: Uvula is midline and oropharynx is clear and moist. No oropharyngeal exudate.  Slight frontal sinus tenderness to palpation   Eyes: Pupils are equal, round, and reactive to light.  Neck: Normal range of motion. Neck supple. No JVD present.  Cardiovascular: Normal rate, regular rhythm and normal heart sounds.  Pulmonary/Chest: Effort normal and breath  sounds normal. No accessory muscle usage. No respiratory distress. She has no decreased breath sounds. She has no wheezes. She has no rhonchi.  Abdominal: Soft. Bowel sounds are normal. There is no tenderness.  Musculoskeletal: Normal range of motion. She exhibits no edema.  Lymphadenopathy:    She has no cervical adenopathy.  Neurological: She is alert and oriented to person, place, and time. Gait normal.  Skin: Skin is warm and dry. She is not diaphoretic. No erythema.  Psychiatric: Memory and affect normal. Her mood appears anxious. She exhibits disordered thought content.  Nursing note and vitals reviewed.     Lab Results:  CBC    Component Value Date/Time   WBC 11.7 (H) 02/18/2016 0059   RBC 4.78 02/18/2016 0059   HGB 14.7 02/18/2016 0059   HCT 41.5 02/18/2016 0059   PLT 322 02/18/2016 0059   MCV 86.8 02/18/2016 0059   MCH 30.8 02/18/2016 0059   MCHC 35.4 02/18/2016 0059   RDW 13.6 02/18/2016 0059    BMET    Component Value Date/Time   NA 133 (L) 02/18/2016 0059   K 3.0 (L) 02/18/2016 0059   CL 96 (L) 02/18/2016 0059   CO2 27 02/18/2016 0059   GLUCOSE 99 02/18/2016 0059   BUN 21 (H) 02/18/2016 0059   CREATININE 0.97 02/18/2016 0059   CALCIUM 8.9 02/18/2016 0059   GFRNONAA >60 02/18/2016 0059   GFRAA >60 02/18/2016 0059    BNP No results found for: BNP  ProBNP No results found for: PROBNP  Imaging: No results found.    Assessment & Plan:   62 year old patient presents today for follow-up visit.  I explained to the patient extensively that she needs to follow our plan of care in order to help manage her chronic diseases.  Patient reports she will recommit and wear her CPAP regularly.  I explained to patient is very difficult for [place] to manage her obstructive sleep apnea when you refused to wear your CPAP and do not let us know that you are having any issues.  When discussing with patient she cannot report any issues as far as with pressure or with a mask.  I  offered a order for so patient could try a different mask as she is using a nasal mask today, patient declined.  Patient says that she will just restart wearing it.  Continue GERD management as outlined in plan of care and  previous office notes.  Unfortunately patient continues to consume food items that can exacerbate GERD.  As an tomatoes, tomato sauces, carbonated beverages.  I explained to patient that this can exacerbate her chronic cough.  Patient received flu vaccine and Pneumovax 23 today.  I explained to patient extensively that she needs to discuss with primary care when she sees them this week her need for referrals for pain management, neurosurgery, as well as management of GERD, hypertension, and need for routine blood work to manage chronic diseases.  Obstructive sleep apnea We recommend that you continue using your CPAP daily >>>Keep up the hard work using your device >>> Goal should be wearing this for the entire night that you are sleeping, at least 4 to 6 hours  Remember:  . Do not drive or operate heavy machinery if tired or drowsy.  . Please notify the supply company and office if you are unable to use your device regularly due to missing supplies or machine being broken.  . Work on maintaining a healthy weight and following your recommended nutrition plan  . Maintain proper daily exercise and movement  . Maintaining proper use of your device can also help improve management of other chronic illnesses such as: Blood pressure, blood sugars, and weight management.   BiPAP/ CPAP Cleaning:  >>>Clean weekly, with Jeris soap, and bottle brush.  Set up to air dry.  Follow-up in 3 months   GERD (gastroesophageal reflux disease) Continue follow-up with primary care  Continue omeprazole 20 mg in the morning Continue Pepcid 20 mg at night Review GERD literature listed below  Discussed with primary care blood pressure, GERD, potential referral to nutritionist, referral to pain  management   Follow-up in 3 months   Cough Breo Ellipta 100 >>> Take 1 puff daily in the morning right when you wake up >>>Rinse your mouth out after use >>>This is a daily maintenance inhaler, NOT a rescue inhaler >>>Contact our office if you are having difficulties affording or obtaining this medication >>>It is important for you to be able to take this daily and not miss any doses  Continue follow-up with primary care  Continue omeprazole 20 mg in the morning Continue Pepcid 20 mg at night Review GERD literature listed below  Continue Singulair   Follow-up in 3 months   Essential hypertension Continue follow-up with primary care  Continue amlodipine   Discussed with primary care blood pressure, GERD, potential referral to nutritionist, referral to pain management   Follow-up in 3 months   Healthcare maintenance Regular flu vaccine today Pneumovax 23 today  Continue follow-up with primary care  Discussed with primary care blood pressure, GERD, potential referral to nutritionist, referral to pain management   Follow-up in 3 months   History of pulmonary embolus (PE) Continue Eliquis  >>>we will refill this today      Lauraine Rinne, NP 10/20/2017

## 2017-10-20 NOTE — Assessment & Plan Note (Signed)
Regular flu vaccine today Pneumovax 23 today  Continue follow-up with primary care  Discussed with primary care blood pressure, GERD, potential referral to nutritionist, referral to pain management   Follow-up in 3 months

## 2017-10-20 NOTE — Assessment & Plan Note (Signed)
We recommend that you continue using your CPAP daily >>>Keep up the hard work using your device >>> Goal should be wearing this for the entire night that you are sleeping, at least 4 to 6 hours  Remember:  . Do not drive or operate heavy machinery if tired or drowsy.  . Please notify the supply company and office if you are unable to use your device regularly due to missing supplies or machine being broken.  . Work on maintaining a healthy weight and following your recommended nutrition plan  . Maintain proper daily exercise and movement  . Maintaining proper use of your device can also help improve management of other chronic illnesses such as: Blood pressure, blood sugars, and weight management.   BiPAP/ CPAP Cleaning:  >>>Clean weekly, with [place] soap, and bottle brush.  Set up to air dry.  Follow-up in 3 months

## 2017-10-22 ENCOUNTER — Ambulatory Visit (INDEPENDENT_AMBULATORY_CARE_PROVIDER_SITE_OTHER)
Admission: RE | Admit: 2017-10-22 | Discharge: 2017-10-22 | Disposition: A | Discharge status: Home / Self Care | Payer: Managed Care, Other (non HMO) | Source: Ambulatory Visit | Attending: Nurse Practitioner | Admitting: Nurse Practitioner

## 2017-10-22 ENCOUNTER — Ambulatory Visit: Payer: Managed Care, Other (non HMO) | Admitting: Nurse Practitioner

## 2017-10-22 ENCOUNTER — Encounter: Payer: Self-pay | Admitting: Nurse Practitioner

## 2017-10-22 ENCOUNTER — Other Ambulatory Visit (INDEPENDENT_AMBULATORY_CARE_PROVIDER_SITE_OTHER): Payer: Managed Care, Other (non HMO)

## 2017-10-22 VITALS — BP 108/88 | HR 69 | Ht 66.0 in | Wt 221.0 lb

## 2017-10-22 DIAGNOSIS — M79671 Pain in right foot: Secondary | ICD-10-CM | POA: Insufficient documentation

## 2017-10-22 DIAGNOSIS — R05 Cough: Secondary | ICD-10-CM

## 2017-10-22 DIAGNOSIS — R202 Paresthesia of skin: Secondary | ICD-10-CM | POA: Diagnosis not present

## 2017-10-22 DIAGNOSIS — M5441 Lumbago with sciatica, right side: Secondary | ICD-10-CM

## 2017-10-22 DIAGNOSIS — Z86711 Personal history of pulmonary embolism: Secondary | ICD-10-CM

## 2017-10-22 DIAGNOSIS — I10 Essential (primary) hypertension: Secondary | ICD-10-CM | POA: Diagnosis not present

## 2017-10-22 DIAGNOSIS — R059 Cough, unspecified: Secondary | ICD-10-CM

## 2017-10-22 DIAGNOSIS — K219 Gastro-esophageal reflux disease without esophagitis: Secondary | ICD-10-CM

## 2017-10-22 DIAGNOSIS — G8929 Other chronic pain: Secondary | ICD-10-CM | POA: Insufficient documentation

## 2017-10-22 DIAGNOSIS — M542 Cervicalgia: Secondary | ICD-10-CM | POA: Insufficient documentation

## 2017-10-22 DIAGNOSIS — E669 Obesity, unspecified: Secondary | ICD-10-CM | POA: Diagnosis not present

## 2017-10-22 DIAGNOSIS — M25561 Pain in right knee: Secondary | ICD-10-CM

## 2017-10-22 LAB — CBC
HEMATOCRIT: 39.7 % (ref 36.0–46.0)
Hemoglobin: 13.5 g/dL (ref 12.0–15.0)
MCHC: 33.9 g/dL (ref 30.0–36.0)
MCV: 89.1 fl (ref 78.0–100.0)
Platelets: 322 10*3/uL (ref 150.0–400.0)
RBC: 4.45 Mil/uL (ref 3.87–5.11)
RDW: 13.4 % (ref 11.5–15.5)
WBC: 4.1 10*3/uL (ref 4.0–10.5)

## 2017-10-22 LAB — COMPREHENSIVE METABOLIC PANEL
ALT: 21 U/L (ref 0–35)
AST: 19 U/L (ref 0–37)
Albumin: 4.2 g/dL (ref 3.5–5.2)
Alkaline Phosphatase: 69 U/L (ref 39–117)
BUN: 13 mg/dL (ref 6–23)
CALCIUM: 9.7 mg/dL (ref 8.4–10.5)
CHLORIDE: 105 meq/L (ref 96–112)
CO2: 29 meq/L (ref 19–32)
Creatinine, Ser: 0.82 mg/dL (ref 0.40–1.20)
GFR: 91.39 mL/min (ref >60.00)
GLUCOSE: 96 mg/dL (ref 70–99)
Potassium: 3.9 mEq/L (ref 3.5–5.1)
SODIUM: 141 meq/L (ref 135–145)
Total Bilirubin: 0.4 mg/dL (ref 0.2–1.2)
Total Protein: 7.7 g/dL (ref 6.0–8.3)

## 2017-10-22 LAB — TSH: TSH: 0.73 u[IU]/mL (ref 0.35–4.50)

## 2017-10-22 LAB — MAGNESIUM: Magnesium: 2.1 mg/dL (ref 1.5–2.5)

## 2017-10-22 LAB — VITAMIN B12: VITAMIN B 12: 424 pg/mL (ref 211–911)

## 2017-10-22 MED ORDER — OMEPRAZOLE 40 MG PO CPDR: 40.0000 mg | DELAYED_RELEASE_CAPSULE | Freq: Every day | ORAL | 3 refills | Status: DC

## 2017-10-22 NOTE — Patient Instructions (Addendum)
Please head downstairs for lab work/x-rays. If any of your test results are critically abnormal, you will be contacted right away. Otherwise, I will contact you within a week about your test results and follow up recommendations  Please increase your prilosec to 40 mg daily  I have placed a referral to orthopedics. Our office will begin processing this referral. Please follow up if you have not heard anything about this referral within 10 days.  I will plan to see you back in 6 months, or sooner if needed.   Pulmonary Embolism A pulmonary embolism (PE) is a sudden blockage or decrease of blood flow in one lung or both lungs. Most blockages come from a blood clot that forms in a lower leg, thigh, or arm vein (deep vein thrombosis, DVT) and travels to the lungs. A clot is blood that has thickened into a gel or solid. PE is a dangerous and life-threatening condition that needs to be treated right away. What are the causes? This condition is usually caused by a blood clot that forms in a vein and moves to the lungs. In rare cases, it may be caused by air, fat, part of a tumor, or other tissue that moves through the veins and into the lungs. What increases the risk? The following factors may make you more likely to develop this condition:  Having DVT or a history of DVT.  Being older than age 63.  Personal or family history of blood clots or blood clotting disease.  Major or lengthy surgery.  Orthopedic surgery, especially hip or knee replacement.  Traumatic injury, such as breaking a hip or leg.  Spinal cord injury.  Stroke.  Taking medicines that contain estrogen. These include birth control pills and hormone replacement therapy.  Long-term (chronic) lung or heart disease.  Cancer and chemotherapy.  Having a central venous catheter.  Pregnancy and the period after delivery.  What are the signs or symptoms? Symptoms of this condition usually start suddenly and  include:  Shortness of breath while active or at rest.  Coughing or coughing up blood or blood-tinged mucus.  Chest pain that is often worse with deep breaths.  Rapid or irregular heartbeat.  Feeling light-headed or dizzy.  Fainting.  Feeling anxious.  Sweating.  Pain and swelling in a leg. This is a symptom of DVT, which can lead to PE.  How is this diagnosed? This condition may be diagnosed based on:  Your medical history.  A physical exam.  Blood tests to check blood oxygen level and how well your blood clots, and a D-dimer blood test, which checks your blood for a substance that is released when a blood clot breaks apart.  CT pulmonary angiogram. This test checks blood flow in and around your lungs.  Ventilation-perfusion scan, also called a lung VQ scan. This test measures air flow and blood flow to the lungs.  Ultrasound of the legs to look for blood clots.  How is this treated? Treatment for this conditions depends on many factors, such as the cause of your PE, your risk for bleeding or developing more clots, and other medical conditions you have. Treatment aims to remove, dissolve, or stop blood clots from forming or growing larger. Treatment may include:  Blood thinning medicines (anticoagulants) to stop clots from forming or growing. These medicines may be given as a pill, as an injection, or through an IV tube (infusion).  Medicines that dissolve clots (thrombolytics).  A procedure in which a flexible tube is used  to remove a blood clot (embolectomy) or deliver medicine to destroy it (catheter-directed thrombolysis).  A procedure in which a filter is inserted into a large vein that carries blood to the heart (inferior vena cava). This filter (vena cava filter) catches blood clots before they reach the lungs.  Surgery to remove the clot (surgical embolectomy). This is rare.  You may need a combination of immediate, long-term (up to 3 months after diagnosis),  and extended (more than 3 months after diagnosis) treatments. Your treatment may continue for several months (maintenance therapy). You and your health care provider will work together to choose the treatment program that is best for you. Follow these instructions at home: If you are taking an anticoagulant medicine:  Take the medicine every day at the same time each day.  Understand what foods and drugs interact with your medicine.  Understand the side effects of this medicine, including excessive bruising or bleeding. Ask your health care provider or pharmacist about other side effects. General instructions  Take over-the-counter and prescription medicines only as told by your health care provider.  Anticoagulant medicines may cause side effects, including easy bruising and difficulty stopping bleeding. If you are prescribed an anticoagulant: ? Hold pressure over cuts for longer than usual. ? Tell your dentist and other health care providers that you are taking anticoagulants before you have any procedure that may cause bleeding. ? Avoid contact sports. ? Be extra careful when handling sharp objects. ? Use a soft toothbrush. Floss with waxed dental floss. ? Shave with an Copy.  Wear a medical alert bracelet or carry a medical alert card that says you have had a PE.  Ask your health care provider when you may return to your normal activities.  Talk with your health care provider about any travel plans. It is important to make sure that you are still able to take your medicine while on trips.  Keep all follow-up visits as told by your health care provider. This is important. How is this prevented? Take these actions to lower your risk of developing another PE:  Exercise regularly. Take frequent walks. For at least 30 minutes every day, engage in: ? Activity that involves moving your arms and legs. ? Activity that encourages good blood flow through your body by increasing  your heart rate.  While traveling, drink plenty of water and avoid drinking alcohol. Ask your health care provider if you should wear below-the-knee compression stockings.  Avoid sitting or lying in bed for long periods of time without moving your legs. Exercise your arms and legs every hour during long-distance travel (over 4 hours).  If you are hospitalized or have surgery, ask your health care provider about your risks and what treatments can help prevent blood clots.  Maintain a healthy weight. Ask your health care provider what weight is healthy for you.  If you are a woman who is over age 36, avoid unnecessary use of medicines that contain estrogen, including birth control pills.  Do not use any products that contain nicotine or tobacco, such as cigarettes and e-cigarettes. This is especially important if you take estrogen medicines. If you need help quitting, ask your health care provider.  See your health care provider for regular checkups. This may include blood tests and ultrasound testing on your legs to check for new blood clots.  Contact a health care provider if:  You missed a dose of your blood thinner medicine. Get help right away if:  You have  new or increased pain, swelling, warmth, or redness in an arm or leg.  You have numbness or tingling in an arm or leg.  You have shortness of breath while active or at rest.  You have chest pain.  You have a rapid or irregular heartbeat.  You feel light-headed or dizzy.  You cough up blood.  You have blood in your vomit, stool, or urine.  You have a fever.  You have abdomen (abdominal) pain.  You have a severe fall or head injury.  You have a severe headache.  You have vision changes.  You cannot move your arms or legs.  You are confused or have memory loss.  You are bleeding for 10 minutes or more, even with strong pressure on the wound. These symptoms may represent a serious problem that is an emergency. Do  not wait to see if the symptoms will go away. Get medical help right away. Call your local emergency services (911 in the [place] Do not drive yourself to the hospital. Summary  A pulmonary embolism (PE) is a sudden blockage or decrease of blood flow in one lung or both lungs. PE is a dangerous and life-threatening condition that needs to be treated right away.  Having deep vein thrombosis (DVT) or a history of DVT is the most common risk factor for PE.  Treatments for this condition usually include medicines to thin your blood (anticoagulants) or medicines to break apart blood clots (thrombolytics).  If you are prescribed blood thinners, it is important to take the medicine every single day at the same time each day.  If you have signs of PE or DVT, call your local emergency services (911 in the [place] This information is not intended to replace advice given to you by your health care provider. Make sure you discuss any questions you have with your health care provider. Document Released: 01/18/2000 Document Revised: 02/23/2016 Document Reviewed: 02/23/2016 Elsevier Interactive Patient Education  2018 [employer].

## 2017-10-22 NOTE — Assessment & Plan Note (Signed)
Stable Continue current medication Update labs - CBC; Future - Comprehensive metabolic panel; Future - Ambulatory referral to Nutrition and Diabetic Education

## 2017-10-22 NOTE — Assessment & Plan Note (Signed)
Referral to orthopedics per pt request - AMB referral to orthopedics

## 2017-10-22 NOTE — Progress Notes (Signed)
Name: Isabel Garcia   MRN: [id number]    DOB: April 28, 1955   Date:10/22/2017       Progress Note  Subjective  Chief Complaint Establish care  HPI Isabel Garcia is here today to establish care as a new patient to our practice, just moved back to [place] from [place], was not really following with PCP there but was following with pulmonology for OSA and cough, has re-established here with [facility]m. Was also following with spine and pain specialists in [place] for neck, back and knee pain and achilles tendinitis, plantar fascitis, and was told she should follow up with orthopedics for further evaluation so she is requesting that referral today. She was also following with vein and vasc provider in [place] for DVT, PE this past January, maintained on eliquis 5 BID, reports daily medication compliance. She is maintained on  Pepcid, prilosec for "silent GERD", says does not notice symptoms of heartburn or GERD but was told by specialist to continue these medications  She is maintained on Amlodipine 10 daily for HTN Reports daily medication compliance without adverse noted adverse medication effects. Reports she routinely checks her BP at home with readings 120s/80s. She has been trying to work on improving her diet at home.  BP Readings from Last 3 Encounters:  10/22/17 108/88  10/20/17 (!) 150/92  09/08/17 128/84   She has several complaints today including continued neck, back, left hip, right leg and knee pain, sometimes her legs feel tingly. She also c/o feeling short of breath and having a dry cough, which have both been ongoing for some time now, since at least this past January She denies fevers, confusion, syncope, weakness, chest pain, hemoptysis, edema, erythema, bruising, bleeding.   Patient Active Problem List   Diagnosis Date Noted  . Healthcare maintenance 10/20/2017  . History of pulmonary embolus (PE) 10/20/2017  . GERD (gastroesophageal reflux disease) 09/08/2017  . Cough 08/11/2017  . Essential  hypertension 08/11/2017  . Obstructive sleep apnea 08/11/2017    Past Surgical History:  Procedure Laterality Date  . ABDOMINAL HYSTERECTOMY    . KNEE ARTHROSCOPY Right   . KNEE ARTHROSCOPY Right     Family History  Problem Relation Age of Onset  . Arthritis Mother   . Hypertension Mother   . Hypertension Father   . Heart attack Father   . Early death Father   . Asthma Sister   . Heart attack Brother   . Early death Brother   . Hypertension Brother   . Stroke Maternal Grandmother   . Heart attack Paternal Grandfather   . Early death Paternal Grandfather     Social History   Socioeconomic History  . Marital status: Divorced    Spouse name: Not on file  . Number of children: Not on file  . Years of education: Not on file  . Highest education level: Not on file  Occupational History  . Not on file  Social Needs  . Financial resource strain: Not on file  . Food insecurity:    Worry: Not on file    Inability: Not on file  . Transportation needs:    Medical: Not on file    Non-medical: Not on file  Tobacco Use  . Smoking status: Never Smoker  . Smokeless tobacco: Never Used  Substance and Sexual Activity  . Alcohol use: Yes    Comment: wine occasionaly  . Drug use: No  . Sexual activity: Not Currently  Lifestyle  . Physical activity:  Days per week: Not on file    Minutes per session: Not on file  . Stress: Not on file  Relationships  . Social connections:    Talks on phone: Not on file    Gets together: Not on file    Attends religious service: Not on file    Active member of club or organization: Not on file    Attends meetings of clubs or organizations: Not on file    Relationship status: Not on file  . Intimate partner violence:    Fear of current or ex partner: Not on file    Emotionally abused: Not on file    Physically abused: Not on file    Forced sexual activity: Not on file  Other Topics Concern  . Not on file  Social History Narrative   . Not on file     Current Outpatient Medications:  .  albuterol (PROVENTIL HFA) 108 (90 Base) MCG/ACT inhaler, Inhale 2 puffs into the lungs every 6 (six) hours as needed., Disp: , Rfl:  .  albuterol (PROVENTIL HFA;VENTOLIN HFA) 108 (90 Base) MCG/ACT inhaler, Inhale 1-2 puffs into the lungs every 6 (six) hours as needed for wheezing or shortness of breath., Disp: , Rfl:  .  amLODipine (NORVASC) 10 MG tablet, Take 1 tablet (10 mg total) daily for high blood pressure., Disp: 30 tablet, Rfl: 1 .  apixaban (ELIQUIS) 5 MG TABS tablet, Take 1 tablet (5 mg total) by mouth 2 (two) times daily., Disp: 60 tablet, Rfl: 3 .  benzonatate (TESSALON) 200 MG capsule, Take 1 capsule (200 mg total) by mouth 3 (three) times daily as needed for cough., Disp: 30 capsule, Rfl: 1 .  famotidine (PEPCID) 20 MG tablet, Take 1 tablet (20 mg total) by mouth 2 (two) times daily., Disp: 30 tablet, Rfl: 0 .  fluticasone furoate-vilanterol (BREO ELLIPTA) 100-25 MCG/INH AEPB, Inhale 1 puff into the lungs daily., Disp: , Rfl:  .  loperamide (IMODIUM) 2 MG capsule, Take 1 capsule (2 mg total) by mouth 4 (four) times daily as needed for diarrhea or loose stools., Disp: 12 capsule, Rfl: 0 .  metoprolol succinate (TOPROL-XL) 25 MG 24 hr tablet, Take 25 mg by mouth every morning., Disp: , Rfl:  .  montelukast (SINGULAIR) 10 MG tablet, Take 1 tablet (10 mg total) by mouth at bedtime., Disp: 30 tablet, Rfl: 5 .  omeprazole (PRILOSEC) 20 MG capsule, Take 1 capsule (20 mg total) by mouth daily., Disp: 30 capsule, Rfl: 4  No Known Allergies   ROS See HPI  Objective  Vitals:   10/22/17 1402  BP: 108/88  Pulse: 69  SpO2: 98%  Weight: 221 lb (100.2 kg)  Height: 5' 6"  (1.676 m)    Body mass index is 35.67 kg/m.  Physical Exam Vital signs reviewed. Constitutional: Patient appears well-developed and well-nourished. No distress.  HENT: Head: Normocephalic and atraumatic. Nose: Nose normal. Mouth/Throat: Oropharynx is clear  and moist. No oropharyngeal exudate.  Eyes: Conjunctivae and EOM are normal. Pupils are equal, round, and reactive to light. No scleral icterus.  Neck: Normal range of motion. Neck supple. No thyromegaly present. No cervical adenopathy. Cardiovascular: Normal rate, regular rhythm and normal heart sounds. No BLE edema. Distal pulses intact. Pulmonary/Chest: Effort normal and breath sounds normal. No respiratory distress. Musculoskeletal: Normal range of motion. No gross deformities Neurological: She is alert and oriented to person, place, and time. No cranial nerve deficit. Coordination, balance, strength, speech and gait are normal.  Skin: Skin is  warm and dry. No rash noted. No erythema.  Psychiatric: Patient has a normal mood and affect. behavior is normal. Judgment and thought content normal.   Assessment & Plan RTC in 6 months for CPE  Tingling in extremities Could be related to MSK problems Labs today to r/o other causes F/U with further recommendations pending lab results - CBC; Future - Comprehensive metabolic panel; Future - Vitamin B12; Future - TSH; Future - Magnesium; Future

## 2017-10-22 NOTE — Assessment & Plan Note (Signed)
Continue eliquis Update labs Due to c/o cough and SOB will update D-dimer Requesting records from prior vascular provider to update chart Return precautions and additional education provided on AVS - CBC; Future - D-Dimer, Quantitative; Future

## 2017-10-22 NOTE — Assessment & Plan Note (Signed)
Update labs Continue to work on healthy diet She is interested in nutrition consult, referral placed - CBC; Future - Comprehensive metabolic panel; Future - TSH; Future - Ambulatory referral to Nutrition and Diabetic Education

## 2017-10-22 NOTE — Assessment & Plan Note (Signed)
Due to continued cough and SOB, will try to increase her dosage of prilosec Continue pepcid Update labs, CXR Continue F/u with pulmonology as scheduled - CBC; Future - DG Chest 2 View; Future - omeprazole (PRILOSEC) 40 MG capsule; Take 1 capsule (40 mg total) by mouth daily.  Dispense: 30 capsule; Refill: 3

## 2017-10-22 NOTE — Assessment & Plan Note (Signed)
Due to continued cough and SOB, will try to increase her dosage of prilosec Continue pepcid Update labs - CBC; Future - omeprazole (PRILOSEC) 40 MG capsule; Take 1 capsule (40 mg total) by mouth daily.  Dispense: 30 capsule; Refill: 3 - Magnesium; Future

## 2017-10-23 LAB — D-DIMER, QUANTITATIVE: D-Dimer, Quant: 0.22 mcg/mL FEU (ref <0.50)

## 2017-11-16 ENCOUNTER — Telehealth: Payer: Self-pay | Admitting: Pulmonary Disease

## 2017-11-16 DIAGNOSIS — I10 Essential (primary) hypertension: Secondary | ICD-10-CM

## 2017-11-16 NOTE — Telephone Encounter (Signed)
Rx sent. See meds.

## 2017-11-16 NOTE — Telephone Encounter (Signed)
Pt calling to ask if Hollie [facility] could take over her amLODipine (NORVASC) 10 MG tablet  This was prescribed last by Dr Warner Mccreedy, but he suggested her pcp take this over.   [place], [place] - [id number] [phone] (Phone) [phone] (Fax)

## 2017-11-18 ENCOUNTER — Ambulatory Visit: Payer: Managed Care, Other (non HMO) | Admitting: Dietician

## 2017-11-19 ENCOUNTER — Encounter: Payer: Self-pay | Admitting: Dietician

## 2017-11-19 ENCOUNTER — Encounter: Payer: 59 | Attending: Nurse Practitioner | Admitting: Dietician

## 2017-11-19 DIAGNOSIS — Z713 Dietary counseling and surveillance: Secondary | ICD-10-CM | POA: Diagnosis not present

## 2017-11-19 DIAGNOSIS — E669 Obesity, unspecified: Secondary | ICD-10-CM | POA: Insufficient documentation

## 2017-11-19 DIAGNOSIS — I10 Essential (primary) hypertension: Secondary | ICD-10-CM | POA: Insufficient documentation

## 2017-11-19 NOTE — Patient Instructions (Addendum)
-   Eat small meals throughout the day (about every 3-5 hours)   - Avoid irritating foods (coffee, chocolate, citrus fruits, tomatoes, fried foods, spearmint)   - Try avoid eating too close to bed time (about 2 hours before bed/ laying down)   - Limit sodium intake by using herbs, Mrs. Dash, seasonings/spices instead of salt and choosing foods naturally low in sodium such as fruits, vegetables, and whole grains  - Try cooking methods such as baking, broiling, grilling, and air frying

## 2017-11-19 NOTE — Progress Notes (Signed)
Medical Nutrition Therapy  Appt Start Time: 9:05 am End Time: 10:15 am   Assessment:  Primary concerns today: Pt states she would like to learn more about nutrition and eating habits to help with her high blood pressure, GERD, and to facilitate weight loss. Pt states she was told she could potentially develop prediabetes during a recent hospitalization and would like to avoid that.  Learning Readiness:   Change in progress- Pt states she has already made lifestyle changes including diet and physical activity.   Pt states she recently moved back here from [place] in May to care for her mother.   Medications: none   Dietary Intake  Usual eating pattern includes 2 meals and 2 snacks per day. Everyday foods include Greek salad or some type of vegetables.  Avoided foods include red meat/excessive amounts of meat in general and dairy dt stomach upset.   24-Hr Recall B: boiled egg + bacon  Snk: nuts or homemade trail mix (nuts + coconut flakes + dried fruit) L: salad or vegetables Snk: none  D: baked chicken or fish + vegetable or salad  Snk: none Beverages: coffee + water + sparkling water   Diet Hx: Pt states she drinks coffee with ghee and collagen in the mornings as well as MCT oil. Pt states she drinks lots of water. Pt states she and her mother go out to eat fried fish once per week, however it causes stomach upset and diarrhea. Pt states she likes vegetables and fruits but is curious about "which ones to avoid." Pt states she loves sweets and bread but has been limiting these foods for the past few weeks. Pt states she is also cutting back on meat. Pt states dairy foods do not sit well on her stomach and that tomato sauce causes nausea when she eats spaghetti.     GI / other notable symptoms: nausea (caused by tomato sauce, dairy, fried foods)  Average weekly physical activity: water aerobics 3+ days/week, tai-chi 1 day/week, ride bike every day. Pt states she feels better after  working out and is tired but not to the point of fatigue/lightheadedness.   Sleep: Pt states she has sleep apnea and does not sleep well at night. Pt states she has a machine but does not wear it consistently. Pt states she does not get good sleep without it, however it makes her feel claustrophobic and uncomfortable to wear it.     Estimated energy needs:  1800 calories 200 g carbohydrates 113 g protein 60 g fat   Nutritional Diagnosis: Altered gastrointestinal (GI) function (Novinger-1.4) related to GERD (gastroesophageal reflux disease) as evidenced by pt reported nausea, diarrhea, reflux, and abdominal pain following intake of specific foods/food groups (such as dairy, tomato sauce, and fried foods.)   Intervention:  Nutrition education (E-1.1) on topics including the following:  MyPlate: general healthful eating, balancing food groups   Sodium: strategies for limiting intake to <2466m/day (choose foods naturally low in sodium, avoid pre-packaged/prepared meals and eating out, do not add salt to foods)  GERD: avoid irritating foods, try not to eat within 2 hours of bedtime, eat small meals/snacks every 3-5 hours, stay sitting upright after eating  Diet modified for specific foods or ingredients (ND-1.2.9)  Avoid foods/food groups that cause GI upset and/or worsen GERD symptoms (dairy, tomato sauce, fried foods)    Handouts given during visit include:  MyPlate   Meal Ideas   Snack Ideas   Bake, Broil, Grill   Teaching Method Utilized:  Visual & Auditory Demonstrated degree of understanding via: Teach Back   Barriers to learning/adherence to lifestyle change: None Identified  Monitoring/Evaluation:  Dietary intake, exercise, and GI symptoms.

## 2017-12-02 ENCOUNTER — Encounter: Payer: Self-pay | Admitting: Family

## 2017-12-02 ENCOUNTER — Other Ambulatory Visit: Payer: 59

## 2017-12-02 ENCOUNTER — Ambulatory Visit (INDEPENDENT_AMBULATORY_CARE_PROVIDER_SITE_OTHER): Payer: 59 | Admitting: Family

## 2017-12-02 VITALS — BP 140/88 | HR 62 | Temp 98.2°F | Ht 66.0 in | Wt 210.1 lb

## 2017-12-02 DIAGNOSIS — K219 Gastro-esophageal reflux disease without esophagitis: Secondary | ICD-10-CM

## 2017-12-02 DIAGNOSIS — Z91018 Allergy to other foods: Secondary | ICD-10-CM | POA: Diagnosis not present

## 2017-12-02 DIAGNOSIS — R059 Cough, unspecified: Secondary | ICD-10-CM

## 2017-12-02 DIAGNOSIS — R05 Cough: Secondary | ICD-10-CM | POA: Diagnosis not present

## 2017-12-02 DIAGNOSIS — R350 Frequency of micturition: Secondary | ICD-10-CM

## 2017-12-02 LAB — POC URINALSYSI DIPSTICK (AUTOMATED)
Bilirubin, UA: NEGATIVE
Blood, UA: NEGATIVE
GLUCOSE UA: NEGATIVE
Ketones, UA: NEGATIVE
LEUKOCYTES UA: NEGATIVE
Nitrite, UA: NEGATIVE
Protein, UA: NEGATIVE
Spec Grav, UA: 1.015 (ref 1.010–1.025)
Urobilinogen, UA: 0.2 E.U./dL
pH, UA: 7.5 (ref 5.0–8.0)

## 2017-12-02 MED ORDER — FAMOTIDINE 20 MG PO TABS: 20.0000 mg | ORAL_TABLET | Freq: Two times a day (BID) | ORAL | 0 refills | Status: DC

## 2017-12-02 MED ORDER — HYDROXYZINE HCL 10 MG PO TABS: 10.0000 mg | ORAL_TABLET | Freq: Three times a day (TID) | ORAL | 0 refills | Status: DC | PRN

## 2017-12-02 MED ORDER — METHYLPREDNISOLONE ACETATE 40 MG/ML IJ SUSP
40.0000 mg | Freq: Once | INTRAMUSCULAR | Status: AC
  Administered 2017-12-02: 40 mg via INTRAMUSCULAR

## 2017-12-02 MED ORDER — FLUTICASONE PROPIONATE 50 MCG/ACT NA SUSP: 2.0000 | Freq: Every day | NASAL | 6 refills | Status: DC

## 2017-12-02 NOTE — Addendum Note (Signed)
Addended by: Marcina Millard on: 12/02/2017 04:44 PM   Modules accepted: Orders

## 2017-12-02 NOTE — Progress Notes (Signed)
Isabel Garcia is a 62 y.o. female with the following history as recorded in EpicCare:  Patient Active Problem List   Diagnosis Date Noted  . 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 Chronic pain of right knee 10/22/2017  . Right foot pain 10/22/2017  . Neck pain 10/22/2017  . Obesity (BMI 35.0-39.9 without comorbidity) 10/22/2017  . Healthcare maintenance 10/20/2017  . History of pulmonary embolus (PE) 10/20/2017  . GERD (gastroesophageal reflux disease) 09/08/2017  . Cough 08/11/2017  . Essential hypertension 08/11/2017  . Obstructive sleep apnea 08/11/2017    Current Outpatient Medications  Medication Sig Dispense Refill  . albuterol (PROVENTIL HFA) 108 (90 Base) MCG/ACT inhaler Inhale 2 puffs into the lungs every 6 (six) hours as needed.    Marland Kitchen albuterol (PROVENTIL HFA;VENTOLIN HFA) 108 (90 Base) MCG/ACT inhaler Inhale 1-2 puffs into the lungs every 6 (six) hours as needed for wheezing or shortness of breath.    Marland Kitchen amLODipine (NORVASC) 10 MG tablet TAKE 1 TABLET BY MOUTH ONCE DAILY FOR HIGH BLOOD PRESSURE 90 tablet 0  . apixaban (ELIQUIS) 5 MG TABS tablet Take 1 tablet (5 mg total) by mouth 2 (two) times daily. 60 tablet 3  . benzonatate (TESSALON) 200 MG capsule Take 1 capsule (200 mg total) by mouth 3 (three) times daily as needed for cough. 30 capsule 1  . famotidine (PEPCID) 20 MG tablet Take 1 tablet (20 mg total) by mouth 2 (two) times daily. 30 tablet 0  . fluticasone furoate-vilanterol (BREO ELLIPTA) 100-25 MCG/INH AEPB Inhale 1 puff into the lungs daily.    . montelukast (SINGULAIR) 10 MG tablet Take 1 tablet (10 mg total) by mouth at bedtime. 30 tablet 5  . omeprazole (PRILOSEC) 40 MG capsule Take 1 capsule (40 mg total) by mouth daily. 30 capsule 3  . fluticasone (FLONASE) 50 MCG/ACT nasal spray Place 2 sprays into both nostrils daily. 16 g 6  . hydrOXYzine (ATARAX/VISTARIL) 10 MG tablet Take 1 tablet (10 mg total) by mouth 3 (three) times daily as needed. 30  tablet 0   No current facility-administered medications for this visit.     Allergies: Patient has no known allergies.  Past Medical History:  Diagnosis Date  . Arthritis   . Asthma   . Diverticulitis   . Diverticulosis   . GERD (gastroesophageal reflux disease)   . Heart murmur   . History of blood clots   . History of chicken pox   . History of pulmonary embolus (PE)   . Hypertension   . Liver cirrhosis secondary to NASH (nonalcoholic steatohepatitis) ([place])   . Phlebitis     Past Surgical History:  Procedure Laterality Date  . ABDOMINAL HYSTERECTOMY    . KNEE ARTHROSCOPY Right   . KNEE ARTHROSCOPY Right     Family History  Problem Relation Age of Onset  . Arthritis Mother   . Hypertension Mother   . Hypertension Father   . Heart attack Father   . Early death Father   . Asthma Sister   . Heart attack Brother   . Early death Brother   . Hypertension Brother   . Stroke Maternal Grandmother   . Heart attack Paternal Grandfather   . Early death Paternal Grandfather     Social History   Tobacco Use  . Smoking status: Never Smoker  . Smokeless tobacco: Never Used  Substance Use Topics  . Alcohol use: Yes    Comment: wine occasionaly    Subjective:  Patient  ate salmon on Friday evening- was sick/ vomiting within 4 hours; on Sunday, she ate shrimp and grits at a friend's house; broke out in "itchy rash" on her neck/ upper chest and back; no shortness of breath or difficulty breathing; has never had reaction to shrimp in past; has had problems with crab- not sure if shrimp could have come in contact with crab;    Objective:  Vitals:   12/02/17 0930  BP: 140/88  Pulse: 62  Temp: 98.2 F (36.8 C)  TempSrc: Oral  SpO2: 99%  Weight: 210 lb 1.9 oz (95.3 kg)  Height: 5' 6"  (1.676 m)    General: Well developed, well nourished, in no acute distress  Skin : Warm and dry. Resolving rash noted over chest/ neck/ upper back;  Head: Normocephalic and atraumatic  Lungs:  Respirations unlabored; clear to auscultation bilaterally without wheeze, rales, rhonchi  CVS exam: normal rate and regular rhythm.  Neurologic: Alert and oriented; speech intact; face symmetrical; moves all extremities well; CNII-XII intact without focal deficit   Assessment:  1. Food allergy   2. Cough   3. Gastroesophageal reflux disease, esophagitis presence not specified     Plan:  Depo-Medrol IM 40 mg; update seafood allergy panel; Rx for Atarax 10 mg tid; Rx for Pepcid 20 mg bid; follow-up to be determined; avoid any type of seafood until testing is completed.    No follow-ups on file.  Orders Placed This Encounter  Procedures  . Allergy Panel 19, [employer]    Standing Status:   Future    Number of Occurrences:   1    Standing Expiration Date:   12/03/2018    Requested Prescriptions   Signed Prescriptions Disp Refills  . hydrOXYzine (ATARAX/VISTARIL) 10 MG tablet 30 tablet 0    Sig: Take 1 tablet (10 mg total) by mouth 3 (three) times daily as needed.  . famotidine (PEPCID) 20 MG tablet 30 tablet 0    Sig: Take 1 tablet (20 mg total) by mouth 2 (two) times daily.  . fluticasone (FLONASE) 50 MCG/ACT nasal spray 16 g 6    Sig: Place 2 sprays into both nostrils daily.

## 2017-12-03 LAB — ALLERGY PANEL 19, SEAFOOD GROUP
CLASS: 0
CLASS: 0
CLASS: 0
CLASS: 0
CLASS: 0
Class: 0
Crab: <0.1 kU/L
Fish Cod: <0.1 kU/L
Lobster: <0.1 kU/L

## 2017-12-03 LAB — INTERPRETATION:

## 2017-12-25 ENCOUNTER — Ambulatory Visit: Payer: 59 | Admitting: Family

## 2017-12-25 ENCOUNTER — Encounter: Payer: Self-pay | Admitting: Family

## 2017-12-25 VITALS — BP 148/82 | HR 80 | Temp 98.1°F | Ht 66.0 in | Wt 209.1 lb

## 2017-12-25 DIAGNOSIS — M25512 Pain in left shoulder: Secondary | ICD-10-CM | POA: Diagnosis not present

## 2017-12-25 DIAGNOSIS — M542 Cervicalgia: Secondary | ICD-10-CM | POA: Diagnosis not present

## 2017-12-25 DIAGNOSIS — M545 Low back pain, unspecified: Secondary | ICD-10-CM

## 2017-12-25 MED ORDER — METHOCARBAMOL 500 MG PO TABS: 500.0000 mg | ORAL_TABLET | Freq: Three times a day (TID) | ORAL | 0 refills | Status: DC | PRN
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
## 2018-01-19 ENCOUNTER — Ambulatory Visit: Payer: Managed Care, Other (non HMO) | Admitting: Pulmonary Disease

## 2018-02-11 ENCOUNTER — Other Ambulatory Visit: Payer: Self-pay | Admitting: Nurse Practitioner

## 2018-02-11 DIAGNOSIS — I10 Essential (primary) hypertension: Secondary | ICD-10-CM

## 2018-02-22 ENCOUNTER — Telehealth: Payer: Self-pay | Admitting: Family

## 2018-02-22 NOTE — Telephone Encounter (Signed)
ROI faxed to [facility]

## 2018-03-13 ENCOUNTER — Other Ambulatory Visit: Payer: Self-pay | Admitting: Pulmonary Disease

## 2018-03-13 DIAGNOSIS — R05 Cough: Secondary | ICD-10-CM

## 2018-03-13 DIAGNOSIS — R059 Cough, unspecified: Secondary | ICD-10-CM

## 2018-04-23 ENCOUNTER — Ambulatory Visit: Payer: 59 | Admitting: Nurse Practitioner

## 2018-04-23 ENCOUNTER — Encounter: Payer: Self-pay | Admitting: Nurse Practitioner

## 2018-04-23 ENCOUNTER — Other Ambulatory Visit: Payer: Self-pay

## 2018-04-23 VITALS — BP 136/88 | HR 72 | Temp 98.2°F | Ht 66.0 in | Wt 217.0 lb

## 2018-04-23 DIAGNOSIS — R05 Cough: Secondary | ICD-10-CM | POA: Diagnosis not present

## 2018-04-23 DIAGNOSIS — Z86718 Personal history of other venous thrombosis and embolism: Secondary | ICD-10-CM | POA: Diagnosis not present

## 2018-04-23 DIAGNOSIS — M79605 Pain in left leg: Secondary | ICD-10-CM

## 2018-04-23 DIAGNOSIS — Z7901 Long term (current) use of anticoagulants: Secondary | ICD-10-CM

## 2018-04-23 DIAGNOSIS — Z0000 Encounter for general adult medical examination without abnormal findings: Secondary | ICD-10-CM | POA: Diagnosis not present

## 2018-04-23 DIAGNOSIS — Z86711 Personal history of pulmonary embolism: Secondary | ICD-10-CM | POA: Diagnosis not present

## 2018-04-23 DIAGNOSIS — R059 Cough, unspecified: Secondary | ICD-10-CM

## 2018-04-23 DIAGNOSIS — M79604 Pain in right leg: Secondary | ICD-10-CM

## 2018-04-23 MED ORDER — FLUTICASONE FUROATE-VILANTEROL 100-25 MCG/INH IN AEPB: 1.0000 | INHALATION_SPRAY | Freq: Every day | RESPIRATORY_TRACT | 1 refills | Status: DC

## 2018-04-23 NOTE — Progress Notes (Signed)
Isabel Garcia is a 63 y.o. female with the following history as recorded in EpicCare:  Patient Active Problem List   Diagnosis Date Noted  . 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 Chronic pain of right knee 10/22/2017  . Right foot pain 10/22/2017  . Neck pain 10/22/2017  . Obesity (BMI 35.0-39.9 without comorbidity) 10/22/2017  . Healthcare maintenance 10/20/2017  . History of pulmonary embolus (PE) 10/20/2017  . GERD (gastroesophageal reflux disease) 09/08/2017  . Cough 08/11/2017  . Essential hypertension 08/11/2017  . Obstructive sleep apnea 08/11/2017    Current Outpatient Medications  Medication Sig Dispense Refill  . albuterol (PROVENTIL HFA) 108 (90 Base) MCG/ACT inhaler Inhale 2 puffs into the lungs every 6 (six) hours as needed.    Marland Kitchen albuterol (PROVENTIL HFA;VENTOLIN HFA) 108 (90 Base) MCG/ACT inhaler Inhale 1-2 puffs into the lungs every 6 (six) hours as needed for wheezing or shortness of breath.    Marland Kitchen amLODipine (NORVASC) 10 MG tablet TAKE 1 TABLET BY MOUTH ONCE DAILY FOR HIGH BLOOD PRESSURE 90 tablet 0  . apixaban (ELIQUIS) 5 MG TABS tablet Take 1 tablet (5 mg total) by mouth 2 (two) times daily. 60 tablet 3  . benzonatate (TESSALON) 200 MG capsule Take 1 capsule (200 mg total) by mouth 3 (three) times daily as needed for cough. 30 capsule 1  . famotidine (PEPCID) 20 MG tablet Take 1 tablet (20 mg total) by mouth 2 (two) times daily. 30 tablet 0  . fluticasone (FLONASE) 50 MCG/ACT nasal spray Place 2 sprays into both nostrils daily. 16 g 6  . fluticasone furoate-vilanterol (BREO ELLIPTA) 100-25 MCG/INH AEPB Inhale 1 puff into the lungs daily. 60 each 1  . hydrOXYzine (ATARAX/VISTARIL) 10 MG tablet Take 1 tablet (10 mg total) by mouth 3 (three) times daily as needed. 30 tablet 0  . methocarbamol (ROBAXIN) 500 MG tablet Take 1 tablet (500 mg total) by mouth every 8 (eight) hours as needed. 30 tablet 0  . montelukast (SINGULAIR) 10 MG tablet Take 1 tablet  (10 mg total) by mouth at bedtime. 30 tablet 5  . omeprazole (PRILOSEC) 20 MG capsule TAKE 1 CAPSULE BY MOUTH ONCE DAILY 30 capsule 0  . omeprazole (PRILOSEC) 40 MG capsule Take 1 capsule (40 mg total) by mouth daily. 30 capsule 3  . PENNSAID 2 % SOLN Apply 2 (two) pumps topically to affected area(s) twice a day     No current facility-administered medications for this visit.     Allergies: Patient has no known allergies.  Past Medical History:  Diagnosis Date  . Arthritis   . Asthma   . Diverticulitis   . Diverticulosis   . GERD (gastroesophageal reflux disease)   . Heart murmur   . History of blood clots   . History of chicken pox   . History of pulmonary embolus (PE)   . Hypertension   . Liver cirrhosis secondary to NASH (nonalcoholic steatohepatitis) ([place])   . Phlebitis     Past Surgical History:  Procedure Laterality Date  . ABDOMINAL HYSTERECTOMY    . KNEE ARTHROSCOPY Right   . KNEE ARTHROSCOPY Right     Family History  Problem Relation Age of Onset  . Arthritis Mother   . Hypertension Mother   . Hypertension Father   . Heart attack Father   . Early death Father   . Asthma Sister   . Heart attack Brother   . Early death Brother   . Hypertension Brother   .  Stroke Maternal Grandmother   . Heart attack Paternal Grandfather   . Early death Paternal Grandfather     Social History   Tobacco Use  . Smoking status: Never Smoker  . Smokeless tobacco: Never Used  Substance Use Topics  . Alcohol use: Yes    Comment: wine occasionaly     Subjective:  Isabel Garcia is here today for routine follow up, she was instructed to return in about 6 months from 10/22/2017 for CPE however today she declines CPE, says that she had many tests including pap, mammo, colonoscopy by prior provider in [place], would prefer to send for these records prior to updating HM. She is requesting breo refill today, was seeing pulmonology last year but has not been back recently for follow up as  instructed, last visit with pulmonology on 10/20/2017 with instructions to return in 3 months, says shes been to so many appointments she did not realize she did not go back to pulmonology but will schedule- continues to have mild daily cough and sob, which have been ongoing for some time, no worse, also discussed at her last visit with me on 10/22/17. She had DVT, PE in January 2019, has been maintained on eliquis since, taking daily as prescribed. She c/o bilateral leg pain today, describes as constant aching from hips to feet, worse when laying down at night, has seen Dr Isabel Garcia ortho for lower back pain and did PT, lower back pain is better but continues to have heavy aching pain in BLE and sometimes notices mild swelling in her ankles. No fevers, chills, syncope, weakess, tachycardia, abnormal bruising or bleeding, rash, erythema. 07/29/17 ECHO, 10/22/17 CXR both unremarkable  ROS- See HPI  Objective:  Vitals:   04/23/18 1447  BP: 136/88  Pulse: 72  Temp: 98.2 F (36.8 C)  TempSrc: Oral  SpO2: 98%  Weight: 217 lb (98.4 kg)  Height: 5' 6"  (1.676 m)    General: Well developed, well nourished, in no acute distress  Skin : Warm and dry.  Head: Normocephalic and atraumatic  Eyes: Sclera and conjunctiva clear; pupils round and reactive to light; extraocular movements intact  Oropharynx: Pink, supple. No suspicious lesions  Neck: Supple Lungs: Respirations unlabored; clear to auscultation bilaterally without wheeze, rales, rhonchi  CVS exam: normal rate and regular rhythm.  Musculoskeletal: No deformities; no active joint inflammation  Extremities: No edema, cyanosis, clubbing  Vessels: Symmetric bilaterally  Neurologic: Alert and oriented; speech intact; face symmetrical; moves all extremities well; CNII-XII intact without focal deficit  Psychiatric: Normal mood and affect.   Assessment:  1. Cough   2. History of pulmonary embolus (PE)   3. Healthcare maintenance   4. History of DVT  (deep vein thrombosis)   5. Pain in both lower extremities   6. Current use of long term anticoagulation     Plan:   Pain in both lower extremities Could be r/t venous insufficiency or reflux post DVT Home management-compression stockings, elevation, red flags and return precautions including when to seek immediate care discussed and printed on AVS RTC in 1 month for follow up, could also consider decreasing amlodipine dosage if symptoms persist with conservative treatment  Return in about 1 month (around 05/24/2018) for follow up- leg pain; update health maintenance.  Orders Placed This Encounter  Procedures  . Ambulatory referral to Hematology    Referral Priority:   Routine    Referral Type:   Consultation    Referral Reason:   Specialty Services Required  Requested Specialty:   Oncology    Number of Visits Requested:   1    Requested Prescriptions   Signed Prescriptions Disp Refills  . fluticasone furoate-vilanterol (BREO ELLIPTA) 100-25 MCG/INH AEPB 60 each 1    Sig: Inhale 1 puff into the lungs daily.

## 2018-04-23 NOTE — Patient Instructions (Addendum)
For your legs. I would like to try compression tights and elevation as discussed You can get compression tights from local medical supply store  Please call pulmonology to schedule follow up   You will be contacted to schedule appointment with hematology  Edema  Edema is when you have too much fluid in your body or under your skin. Edema may make your legs, feet, and ankles swell up. Swelling is also common in looser tissues, like around your eyes. This is a common condition. It gets more common as you get older. There are many possible causes of edema. Eating too much salt (sodium) and being on your feet or sitting for a long time can cause edema in your legs, feet, and ankles. Hot weather may make edema worse. Edema is usually painless. Your skin may look swollen or shiny. Follow these instructions at home:  Keep the swollen body part raised (elevated) above the level of your heart when you are sitting or lying down.  Do not sit still or stand for a long time.  Do not wear tight clothes. Do not wear garters on your upper legs.  Exercise your legs. This can help the swelling go down.  Wear elastic bandages or support stockings as told by your doctor.  Eat a low-salt (low-sodium) diet to reduce fluid as told by your doctor.  Depending on the cause of your swelling, you may need to limit how much fluid you drink (fluid restriction).  Take over-the-counter and prescription medicines only as told by your doctor. Contact a doctor if:  Treatment is not working.  You have heart, liver, or kidney disease and have symptoms of edema.  You have sudden and unexplained weight gain. Get help right away if:  You have shortness of breath or chest pain.  You cannot breathe when you lie down.  You have pain, redness, or warmth in the swollen areas.  You have heart, liver, or kidney disease and get edema all of a sudden.  You have a fever and your symptoms get worse all of a sudden.  Summary  Edema is when you have too much fluid in your body or under your skin.  Edema may make your legs, feet, and ankles swell up. Swelling is also common in looser tissues, like around your eyes.  Raise (elevate) the swollen body part above the level of your heart when you are sitting or lying down.  Follow your doctor's instructions about diet and how much fluid you can drink (fluid restriction). This information is not intended to replace advice given to you by your health care provider. Make sure you discuss any questions you have with your health care provider. Document Released: 07/09/2007 Document Revised: 02/08/2016 Document Reviewed: 02/08/2016 Elsevier Interactive Patient Education  2019 [employer].

## 2018-04-26 ENCOUNTER — Telehealth: Payer: Self-pay | Admitting: Nurse Practitioner

## 2018-04-26 DIAGNOSIS — Z7901 Long term (current) use of anticoagulants: Secondary | ICD-10-CM | POA: Insufficient documentation

## 2018-04-26 DIAGNOSIS — Z86718 Personal history of other venous thrombosis and embolism: Secondary | ICD-10-CM | POA: Insufficient documentation

## 2018-04-26 NOTE — Assessment & Plan Note (Signed)
Continue eliquis at current dosage Discussed referral back to hematology for 1 year f/u of DVT, PE, she is agreeable - Ambulatory referral to Hematology

## 2018-04-26 NOTE — Assessment & Plan Note (Addendum)
Requested records from prior provider in [place] to update health maintenance, can review again at next OV and see what additional health maintenance needs ot be updated

## 2018-04-26 NOTE — Assessment & Plan Note (Signed)
Continue current medications Overdue for pulmonology f/u, she will call to schedule

## 2018-04-26 NOTE — Telephone Encounter (Signed)
ROI fax to [facility] for records

## 2018-04-29 NOTE — Telephone Encounter (Signed)
Rec'd from [place] forwarded 64 pages to Dr. Caesar Chestnut

## 2018-05-05 ENCOUNTER — Telehealth: Payer: Self-pay | Admitting: Family

## 2018-05-05 DIAGNOSIS — Z1231 Encounter for screening mammogram for malignant neoplasm of breast: Secondary | ICD-10-CM

## 2018-05-05 NOTE — Telephone Encounter (Signed)
We received records from a provider in [place];  According to what they sent, her last pap smear was in 2017- needs to be updated; Mammogram last done in 2017- needs to be repeated; No records of colonoscopy found; due to using Eliquis, I would recommend that we do a Cologuard as alternative. Isabel Garcia, can you help get that set up for her?  I will put in mammogram order for her. She needs to come here or her GYN once restrictions for OV are lifted.

## 2018-05-11 ENCOUNTER — Other Ambulatory Visit: Payer: Self-pay | Admitting: Pulmonary Disease

## 2018-05-11 DIAGNOSIS — R05 Cough: Secondary | ICD-10-CM

## 2018-05-11 DIAGNOSIS — Z86711 Personal history of pulmonary embolism: Secondary | ICD-10-CM

## 2018-05-11 DIAGNOSIS — R059 Cough, unspecified: Secondary | ICD-10-CM

## 2018-05-12 ENCOUNTER — Other Ambulatory Visit: Payer: Self-pay

## 2018-05-12 DIAGNOSIS — Z1211 Encounter for screening for malignant neoplasm of colon: Secondary | ICD-10-CM

## 2018-05-13 ENCOUNTER — Other Ambulatory Visit: Payer: Self-pay

## 2018-05-13 DIAGNOSIS — R05 Cough: Secondary | ICD-10-CM

## 2018-05-13 DIAGNOSIS — I10 Essential (primary) hypertension: Secondary | ICD-10-CM

## 2018-05-13 DIAGNOSIS — R059 Cough, unspecified: Secondary | ICD-10-CM

## 2018-05-13 DIAGNOSIS — K219 Gastro-esophageal reflux disease without esophagitis: Secondary | ICD-10-CM

## 2018-05-13 MED ORDER — OMEPRAZOLE 40 MG PO CPDR: 40.0000 mg | DELAYED_RELEASE_CAPSULE | Freq: Every day | ORAL | 3 refills | Status: DC

## 2018-05-13 MED ORDER — AMLODIPINE BESYLATE 10 MG PO TABS: 10.0000 mg | ORAL_TABLET | Freq: Every day | ORAL | 0 refills | Status: DC

## 2018-05-13 NOTE — Telephone Encounter (Signed)
Spoke with patient and info given. Currently she does not have insurance at this current time so she will have to hold off on getting test done at this current time.

## 2018-05-26 ENCOUNTER — Ambulatory Visit: Payer: 59 | Admitting: Nurse Practitioner

## 2018-07-02 ENCOUNTER — Emergency Department (HOSPITAL_COMMUNITY): Payer: Self-pay

## 2018-07-02 ENCOUNTER — Emergency Department (HOSPITAL_COMMUNITY)
Admission: EM | Admit: 2018-07-02 | Discharge: 2018-07-02 | Disposition: A | Discharge status: Home / Self Care | Payer: Self-pay | Attending: Emergency Medicine | Admitting: Emergency Medicine

## 2018-07-02 ENCOUNTER — Ambulatory Visit: Payer: Self-pay | Admitting: Nurse Practitioner

## 2018-07-02 ENCOUNTER — Other Ambulatory Visit: Payer: Self-pay

## 2018-07-02 ENCOUNTER — Encounter (HOSPITAL_COMMUNITY): Payer: Self-pay | Admitting: Emergency Medicine

## 2018-07-02 DIAGNOSIS — R079 Chest pain, unspecified: Secondary | ICD-10-CM | POA: Insufficient documentation

## 2018-07-02 DIAGNOSIS — I10 Essential (primary) hypertension: Secondary | ICD-10-CM | POA: Insufficient documentation

## 2018-07-02 DIAGNOSIS — J45909 Unspecified asthma, uncomplicated: Secondary | ICD-10-CM | POA: Insufficient documentation

## 2018-07-02 HISTORY — DX: Dizziness and giddiness: R42

## 2018-07-02 LAB — BASIC METABOLIC PANEL
Anion gap: 10 (ref 5–15)
BUN: 11 mg/dL (ref 6–20)
CO2: 25 mmol/L (ref 22–32)
Calcium: 9.4 mg/dL (ref 8.9–10.3)
Chloride: 105 mmol/L (ref 98–111)
Creatinine, Ser: 0.81 mg/dL (ref 0.44–1.00)
GFR calc Af Amer: >60 mL/min (ref >60)
GFR calc non Af Amer: >60 mL/min (ref >60)
Glucose, Bld: 91 mg/dL (ref 70–99)
Potassium: 3.2 mmol/L — ABNORMAL LOW (ref 3.5–5.1)
Sodium: 140 mmol/L (ref 135–145)

## 2018-07-02 LAB — CBC
HCT: 37.7 % (ref 36.0–46.0)
Hemoglobin: 12.6 g/dL (ref 12.0–15.0)
MCH: 30.4 pg (ref 26.0–34.0)
MCHC: 33.4 g/dL (ref 30.0–36.0)
MCV: 90.8 fL (ref 80.0–100.0)
Platelets: 348 10*3/uL (ref 150–400)
RBC: 4.15 MIL/uL (ref 3.87–5.11)
RDW: 13 % (ref 11.5–15.5)
WBC: 5.9 10*3/uL (ref 4.0–10.5)
nRBC: 0 % (ref 0.0–0.2)

## 2018-07-02 LAB — TROPONIN I
Troponin I: <0.03 ng/mL (ref <0.03)
Troponin I: <0.03 ng/mL (ref <0.03)

## 2018-07-02 MED ORDER — OXYCODONE HCL 5 MG PO TABS
5.0000 mg | ORAL_TABLET | Freq: Once | ORAL | Status: AC
  Administered 2018-07-02: 5 mg via ORAL
  Filled 2018-07-02: qty 1

## 2018-07-02 MED ORDER — METHOCARBAMOL 500 MG PO TABS: 500.0000 mg | ORAL_TABLET | Freq: Three times a day (TID) | ORAL | 0 refills | Status: DC | PRN

## 2018-07-02 MED ORDER — ASPIRIN 81 MG PO CHEW
324.0000 mg | CHEWABLE_TABLET | Freq: Once | ORAL | Status: AC
  Administered 2018-07-02: 324 mg via ORAL
  Filled 2018-07-02: qty 4

## 2018-07-02 MED ORDER — MECLIZINE HCL 25 MG PO TABS: 25.0000 mg | ORAL_TABLET | Freq: Three times a day (TID) | ORAL | 0 refills | Status: DC | PRN

## 2018-07-02 NOTE — Telephone Encounter (Signed)
Pt. Reports she started having chest pain and shortness of breath 1 hour ago. Left sided chest pain with radiation to top of her left arm. Reports she had a blood clot in her lung in 2018. Instructed to call EMS,but states she is 1 mile from the ED in [place] and will have someone take her now.  Reason for Disposition . [1] Chest pain lasts > 5 minutes AND [2] age > 17 AND [3] at least one cardiac risk factor (i.e., hypertension, diabetes, obesity, smoker or strong family history of heart disease)  Answer Assessment - Initial Assessment Questions 1. LOCATION: "Where does it hurt?"       Left side of chest 2. RADIATION: "Does the pain go anywhere else?" (e.g., into neck, jaw, arms, back)     Left top of arm 3. ONSET: "When did the chest pain begin?" (Minutes, hours or days)      Started 1 hour ago 4. PATTERN "Does the pain come and go, or has it been constant since it started?"  "Does it get worse with exertion?"      Constant 5. DURATION: "How long does it last" (e.g., seconds, minutes, hours)     Constant 6. SEVERITY: "How bad is the pain?"  (e.g., Scale 1-10; mild, moderate, or severe)    - MILD (1-3): doesn't interfere with normal activities     - MODERATE (4-7): interferes with normal activities or awakens from sleep    - SEVERE (8-10): excruciating pain, unable to do any normal activities       6-7 7. CARDIAC RISK FACTORS: "Do you have any history of heart problems or risk factors for heart disease?" (e.g., prior heart attack, angina; high blood pressure, diabetes, being overweight, high cholesterol, smoking, or strong family history of heart disease)     No 8. PULMONARY RISK FACTORS: "Do you have any history of lung disease?"  (e.g., blood clots in lung, asthma, emphysema, birth control pills)     Asthma 9. CAUSE: "What do you think is causing the chest pain?"     Unsure 10. OTHER SYMPTOMS: "Do you have any other symptoms?" (e.g., dizziness, nausea, vomiting, sweating, fever,  difficulty breathing, cough)       Shortness of breath 11. PREGNANCY: "Is there any chance you are pregnant?" "When was your last menstrual period?"       No  Protocols used: CHEST PAIN-A-AH

## 2018-07-02 NOTE — Discharge Instructions (Addendum)
You were evaluated in the Emergency Department and after careful evaluation, we did not find any emergent condition requiring admission or further testing in the hospital.  Your testing today did not reveal any signs of heart damage.  You can use the muscle relaxer provided as needed for soreness in the chest.  It is also very important that you follow-up with your primary care doctor within the next 1 to 2 weeks to discuss your symptoms and discussed the need for outpatient stress testing.  Please return to the Emergency Department if you experience any worsening of your condition.  We encourage you to follow up with a primary care provider.  Thank you for allowing [place] to be a part of your care.

## 2018-07-02 NOTE — ED Provider Notes (Addendum)
[facility] Emergency Department Provider Note MRN:  [id number]  Arrival date & time: 07/02/18     Chief Complaint   Chest Pain   History of Present Illness   Isabel Garcia is a 63 y.o. year-old female with a history of pulmonary embolism, Isabel Garcia cirrhosis presenting to the ED with chief complaint of chest pain.  Pain began suddenly at 11 AM.  Constant since that time.  Described as a dull pressure.  Mild in severity, intermittently radiating to the left shoulder.  Associated with mild nausea, no vomiting.  Denies dizziness or diaphoresis, no shortness of breath.  Endorsing recent dry cough, no fever.  Denies abdominal pain, no numbness or weakness to the arms or legs.  Endorsing right leg pain for the past several weeks, but explains that is located mostly in her Achilles tendon, where she has been diagnosed with tendinitis.  Review of Systems  A complete 10 system review of systems was obtained and all systems are negative except as noted in the HPI and PMH.   Patient's Health History    Past Medical History:  Diagnosis Date  . Arthritis   . Asthma   . Diverticulitis   . Diverticulosis   . GERD (gastroesophageal reflux disease)   . Heart murmur   . History of blood clots   . History of chicken pox   . History of pulmonary embolus (PE)   . Hypertension   . Liver cirrhosis secondary to NASH (nonalcoholic steatohepatitis) ([place])   . Phlebitis   . Vertigo     Past Surgical History:  Procedure Laterality Date  . ABDOMINAL HYSTERECTOMY    . KNEE ARTHROSCOPY Right   . KNEE ARTHROSCOPY Right     Family History  Problem Relation Age of Onset  . Arthritis Mother   . Hypertension Mother   . Hypertension Father   . Heart attack Father   . Early death Father   . Asthma Sister   . Heart attack Brother   . Early death Brother   . Hypertension Brother   . Stroke Maternal Grandmother   . Heart attack Paternal Grandfather   . Early death Paternal Grandfather      Social History   Socioeconomic History  . Marital status: Divorced    Spouse name: Not on file  . Number of children: Not on file  . Years of education: Not on file  . Highest education level: Not on file  Occupational History  . Not on file  Social Needs  . Financial resource strain: Not on file  . Food insecurity:    Worry: Not on file    Inability: Not on file  . Transportation needs:    Medical: Not on file    Non-medical: Not on file  Tobacco Use  . Smoking status: Never Smoker  . Smokeless tobacco: Never Used  Substance and Sexual Activity  . Alcohol use: Yes    Comment: wine occasionaly  . Drug use: No  . Sexual activity: Not Currently  Lifestyle  . Physical activity:    Days per week: Not on file    Minutes per session: Not on file  . Stress: Not on file  Relationships  . Social connections:    Talks on phone: Not on file    Gets together: Not on file    Attends religious service: Not on file    Active member of club or organization: Not on file    Attends meetings of clubs or  organizations: Not on file    Relationship status: Not on file  . Intimate partner violence:    Fear of current or ex partner: Not on file    Emotionally abused: Not on file    Physically abused: Not on file    Forced sexual activity: Not on file  Other Topics Concern  . Not on file  Social History Narrative  . Not on file     Physical Exam  Vital Signs and Nursing Notes reviewed Vitals:   07/02/18 1800 07/02/18 1907  BP: 99/76 (!) 147/84  Pulse: 63 66  Resp: 13 (!) 22  Temp:    SpO2: 97% 100%    CONSTITUTIONAL: Well-appearing, NAD NEURO:  Alert and oriented x 3, no focal deficits EYES:  eyes equal and reactive ENT/NECK:  no LAD, no JVD CARDIO: Regular rate, well-perfused, normal S1 and S2; focal tenderness palpation to the left anterior chest PULM:  CTAB no wheezing or rhonchi GI/GU:  normal bowel sounds, non-distended, non-tender MSK/SPINE:  No gross deformities, no  edema SKIN:  no rash, atraumatic PSYCH:  Appropriate speech and behavior  Diagnostic and Interventional Summary    EKG Interpretation  Date/Time:  Friday Jul 02 2018 16:15:28 EDT Ventricular Rate:  67 PR Interval:    QRS Duration: 101 QT Interval:  425 QTC Calculation: 449 R Axis:   40 Text Interpretation:  Sinus rhythm Low voltage, precordial leads Confirmed by Gerlene Fee [id number]) on 07/02/2018 7:41:33 PM      Labs Reviewed  BASIC METABOLIC PANEL - Abnormal; Notable for the following components:      Result Value   Potassium 3.2 (*)    All other components within normal limits  CBC  TROPONIN I  TROPONIN I    DG Chest 2 View  Final Result      Medications  aspirin chewable tablet 324 mg (324 mg Oral Given 07/02/18 1704)  oxyCODONE (Oxy IR/ROXICODONE) immediate release tablet 5 mg (5 mg Oral Given 07/02/18 1704)     Procedures Critical Care  ED Course and Medical Decision Making  I have reviewed the triage vital signs and the nursing notes.  Pertinent labs & imaging results that were available during my care of the patient were reviewed by me and considered in my medical decision making (see below for details).  Considering ACS, however patient does have reproducibility on exam with palpation, EKG is very reassuring.  Would be appropriate to obtain 2 troponins and consider discharge with PCP or cardiology follow-up.  Heart score calculated at 2.  Patient is adamant that she has not missed any of her anticoagulation doses, she is not tachycardic, not hypoxic, no shortness of breath, no evidence of DVT on exam, and patient explains that this pain is different from her and she experienced when she had a PE.  Little to no concern for PE at this time.  Troponin negative x2, appropriate for close PCP follow-up.  After the discussed management above, the patient was determined to be safe for discharge.  The patient was in agreement with this plan and all questions regarding  their care were answered.  ED return precautions were discussed and the patient will return to the ED with any significant worsening of condition.  Update 8:20 PM: During final counseling with patient, she mentions that her chronic vertigo has been acting up for the past 2 weeks.  Always triggered by change in head position, no other neurological symptoms, normal neurological exam today.  Will provide with  meclizine, advised Epley maneuver at home.   Barth Kirks. [place]onia Small, [email or web address] [email or web address]  Final Clinical Impressions(s) / ED Diagnoses     ICD-10-CM   1. Chest pain, unspecified type R07.9     ED Discharge Orders         Ordered    methocarbamol (ROBAXIN) 500 MG tablet  Every 8 hours PRN     07/02/18 1942             Maudie Flakes, MD 07/02/18 1943    Maudie Flakes, MD 07/02/18 2024

## 2018-07-02 NOTE — ED Triage Notes (Signed)
C/o chest pain, started this am radiating to left breast/shoulder.  C/o nausea, which started 20 minutes ago.  History of blood clots to legs and lung, pt on Eliquis.

## 2018-07-02 NOTE — ED Notes (Signed)
ED Provider at bedside. 

## 2018-09-29 ENCOUNTER — Telehealth: Payer: Self-pay

## 2018-09-29 NOTE — Telephone Encounter (Signed)
Pt left v/m;PEC did cold transfer to back line of triage and pt left message requesting cb about questions she has about covid testing. Pt's PCP is Ashleigh Shambley;from pts chart pt appears to be an [facility] pt. Transferred this note to [facility] [employer] at [place].

## 2018-09-30 ENCOUNTER — Other Ambulatory Visit: Payer: Self-pay

## 2018-09-30 DIAGNOSIS — Z20822 Contact with and (suspected) exposure to covid-19: Secondary | ICD-10-CM

## 2018-09-30 NOTE — Telephone Encounter (Signed)
Can you help with this?

## 2018-09-30 NOTE — Telephone Encounter (Signed)
Looks like LPN from [facility] has entered order for patient to go for COVID testing----no further action needed

## 2018-10-01 LAB — NOVEL CORONAVIRUS, NAA: SARS-CoV-2, NAA: NOT DETECTED

## 2018-10-04 ENCOUNTER — Telehealth: Payer: Self-pay | Admitting: Nurse Practitioner

## 2018-10-04 NOTE — Telephone Encounter (Signed)
Pt aware covid lab test negative, not detected

## 2018-11-22 ENCOUNTER — Other Ambulatory Visit: Payer: Self-pay

## 2018-11-22 DIAGNOSIS — Z20822 Contact with and (suspected) exposure to covid-19: Secondary | ICD-10-CM

## 2018-11-24 LAB — NOVEL CORONAVIRUS, NAA: SARS-CoV-2, NAA: NOT DETECTED

## 2019-03-16 ENCOUNTER — Telehealth: Payer: Self-pay | Admitting: Pulmonary Disease

## 2019-03-16 NOTE — Telephone Encounter (Signed)
I called and spoke with the patient and Isabel Garcia states that Isabel Garcia has an appointment with Dr. Halford Chessman on 2/26, but Isabel Garcia wanted to get an alternative to another blood thinner because Isabel Garcia has changed insurance and the cost has gone up. Please advise.

## 2019-03-16 NOTE — Telephone Encounter (Signed)
LMTCB x 1 

## 2019-03-16 NOTE — Telephone Encounter (Signed)
03/16/2019  This should not have been routed to me as:   1.  Out of the office 2.  Not APP of the day 3.  Have not seen this patient since September/2019  The patient needs a follow-up visit given the fact that she was last seen in 2019.  Patient can also be referred to the clinical pharmacy team to see if they can help investigate more affordable options for her blood thinner.   Wyn Quaker, FNP

## 2019-03-17 ENCOUNTER — Other Ambulatory Visit: Payer: Self-pay

## 2019-03-17 ENCOUNTER — Ambulatory Visit: Payer: 59 | Admitting: Pulmonary Disease

## 2019-03-17 ENCOUNTER — Encounter: Payer: Self-pay | Admitting: Pulmonary Disease

## 2019-03-17 VITALS — BP 134/82 | HR 64 | Temp 97.0°F | Ht 66.0 in | Wt 218.6 lb

## 2019-03-17 DIAGNOSIS — J4540 Moderate persistent asthma, uncomplicated: Secondary | ICD-10-CM | POA: Diagnosis not present

## 2019-03-17 DIAGNOSIS — Z86711 Personal history of pulmonary embolism: Secondary | ICD-10-CM

## 2019-03-17 DIAGNOSIS — G4733 Obstructive sleep apnea (adult) (pediatric): Secondary | ICD-10-CM

## 2019-03-17 NOTE — Patient Instructions (Signed)
Will arrange for an appointment with the pharmacist in our office to discussion options for blood thinner medications  Continue taking aspirin for now  Can use breo as needed  Follow up in 3 months

## 2019-03-17 NOTE — Telephone Encounter (Signed)
Spoke with the pt and scheduled sooner appt with Dr Halford Chessman today since he had openings  He can refer to pharm clinic if needed  Will forward to [place] per Brian's request as this was originally forwarded to her, thanks

## 2019-03-17 NOTE — Progress Notes (Signed)
[facility]  Chief Complaint  Patient presents with  . Follow-up    Wants to discuss use of Eliquis.Cannot afford the medication. Wants to know if there is an alternative medication.    Constitutional:  BP 134/82 (BP Location: Right Arm, Patient Position: Sitting, Cuff Size: Large)   Pulse 64   Temp (!) 97 F (36.1 C)   Ht 5' 6"  (1.676 m)   Wt 218 lb 9.6 oz (99.2 kg)   SpO2 99% Comment: on room air  BMI 35.28 kg/m   Past Medical History:  OA, Diverticulitis, GERD, HTN, NASH with cirrhosis, Vertigo  Brief Summary:  Isabel Garcia is a 64 y.o. female with asthma, obstructive sleep apnea and pulmonary embolism with DVT.  Last seen in 2019.  She had trouble with insurance.  She now has insurance again, but has very high deductible.  She has to pay $500 for eliquis.  As a result she hasn't been able to afford this.  She has been taking aspirin, but hasn't taken eliquis for past two weeks.  She gets cough and chest tightness when the weather is cold and wet.  She has trouble affording breo also.  Hasn't been able to tolerate CPAP and hasn't used in months.  Physical Exam:   Appearance - well kempt   ENMT - clear nasal mucosa, midline nasal  septum, no oral exudates, no LAN, trachea midline  Respiratory - normal chest wall, normal respiratory effort, no accessory muscle use, no wheeze/rales  CV - s1s2 regular rate and rhythm, no murmurs, no peripheral edema, radial pulses symmetric  GI - soft, non tender, no masses  Lymph - no adenopathy noted in neck and axillary areas  MSK - normal gait  Ext - no cyanosis, clubbing, or joint inflammation noted  Skin - no rashes, lesions, or ulcers  Neuro - normal strength, oriented x 3  Psych - normal mood and affect  Assessment/Plan:   History of pulmonary embolism. - continue aspirin - will have her speak with pharmacist in office to determine if there is more affordable option besides  eliquis - discussed symptoms/signs to monitor for that would raise concern for recurrent thromboembolic disease  Moderate persistent asthma. - advised her she can use breo prn to make script last longer - continue singulair and prn albuterol  Obstructive sleep apnea. - she hasn't been able to tolerate CPAP - will monitor clinically for now and reassess at her next visit  Social determinants of health. - she has limited financial resources and insurance limitations are significantly impacting her ability to manage her health   Patient Instructions  Will arrange for an appointment with the pharmacist in our office to discussion options for blood thinner medications  Continue taking aspirin for now  Can use breo as needed  Follow up in 3 months   Time spent 32 minutes  Chesley Mires, MD [place] Pager: [phone] 03/17/2019, 2:40 PM  Flow Sheet     Pulmonary tests:  PFT 09/08/17 >> FEV1 2.20 (94%), FEV1% 89, TLC 4.58 (84%), DLCO 70%  Chest imaging:  CT angio chest 03/01/17 >> b/l PE, patchy basilar GGO CT chest 07/15/17 >> small PE LLL segmental artery  Sleep tests:  HST 04/24/17 >> AHI 29.9, SpO2 low 67% CPAP 07/04/17 >> CPAP 10 cm H2O   Cardiac tests:  Doppler legs 03/01/17 >> nonocclusive DVT Rt popliteal vein Echo 07/29/17 >> EF 65 to 70%, grade 1 DD  Medications:  Allergies as of 03/17/2019   No Known Allergies     Medication List       Accurate as of March 17, 2019  2:40 PM. If you have any questions, ask your nurse or doctor.        amLODipine 10 MG tablet Commonly known as: NORVASC Take 1 tablet (10 mg total) by mouth daily. for high blood pressure   benzonatate 200 MG capsule Commonly known as: TESSALON Take 1 capsule (200 mg total) by mouth 3 (three) times daily as needed for cough.   Eliquis 5 MG Tabs tablet Generic drug: apixaban Take 1 tablet by mouth twice daily   famotidine 20 MG tablet Commonly known as:  PEPCID Take 1 tablet (20 mg total) by mouth 2 (two) times daily.   fluticasone 50 MCG/ACT nasal spray Commonly known as: FLONASE Place 2 sprays into both nostrils daily.   fluticasone furoate-vilanterol 100-25 MCG/INH Aepb Commonly known as: Breo Ellipta Inhale 1 puff into the lungs daily.   hydrOXYzine 10 MG tablet Commonly known as: ATARAX/VISTARIL Take 1 tablet (10 mg total) by mouth 3 (three) times daily as needed.   meclizine 25 MG tablet Commonly known as: ANTIVERT Take 1 tablet (25 mg total) by mouth 3 (three) times daily as needed for dizziness.   methocarbamol 500 MG tablet Commonly known as: Robaxin Take 1 tablet (500 mg total) by mouth every 8 (eight) hours as needed. What changed: Another medication with the same name was removed. Continue taking this medication, and follow the directions you see here. Changed by: Chesley Mires, MD   montelukast 10 MG tablet Commonly known as: SINGULAIR Take 1 tablet (10 mg total) by mouth at bedtime.   omeprazole 40 MG capsule Commonly known as: PRILOSEC Take 1 capsule (40 mg total) by mouth daily. What changed: Another medication with the same name was removed. Continue taking this medication, and follow the directions you see here. Changed by: Chesley Mires, MD   Pennsaid 2 % Soln Generic drug: Diclofenac Sodium Apply 2 (two) pumps topically to affected area(s) twice a day   Proventil HFA 108 (90 Base) MCG/ACT inhaler Generic drug: albuterol Inhale 2 puffs into the lungs every 6 (six) hours as needed. What changed: Another medication with the same name was removed. Continue taking this medication, and follow the directions you see here. Changed by: Chesley Mires, MD       Past Surgical History:  She  has a past surgical history that includes Abdominal hysterectomy; Knee arthroscopy (Right); and Knee arthroscopy (Right).  Family History:  Her family history includes Arthritis in her mother; Asthma in her sister; Early death  in her brother, father, and paternal grandfather; Heart attack in her brother, father, and paternal grandfather; Hypertension in her brother, father, and mother; Stroke in her maternal grandmother.  Social History:  She  reports that she has never smoked. She has never used smokeless tobacco. She reports current alcohol use. She reports that she does not use drugs.

## 2019-03-21 NOTE — Progress Notes (Signed)
Subjective:  Patient presents today to [facility] to see pharmacy team for Eliquis medication access.   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 Patient is unable to afford $200 dollar co-pay for Eliquis each month.  She has been off Eliquis for 1 month.  She is not sure who is managing her anticoagulant at this time.  Objective: No Known Allergies  Outpatient Encounter Medications as of 03/23/2019  Medication Sig  . albuterol (PROVENTIL HFA) 108 (90 Base) MCG/ACT inhaler Inhale 2 puffs into the lungs every 6 (six) hours as needed.  Marland Kitchen amLODipine (NORVASC) 10 MG tablet Take 1 tablet (10 mg total) by mouth daily. for high blood pressure  . benzonatate (TESSALON) 200 MG capsule Take 1 capsule (200 mg total) by mouth 3 (three) times daily as needed for cough.  Marland Kitchen ELIQUIS 5 MG TABS tablet Take 1 tablet by mouth twice daily  . famotidine (PEPCID) 20 MG tablet Take 1 tablet (20 mg total) by mouth 2 (two) times daily.  . fluticasone (FLONASE) 50 MCG/ACT nasal spray Place 2 sprays into both nostrils daily.  . fluticasone furoate-vilanterol (BREO ELLIPTA) 100-25 MCG/INH AEPB Inhale 1 puff into the lungs daily.  . hydrOXYzine (ATARAX/VISTARIL) 10 MG tablet Take 1 tablet (10 mg total) by mouth 3 (three) times daily as needed.  . meclizine (ANTIVERT) 25 MG tablet Take 1 tablet (25 mg total) by mouth 3 (three) times daily as needed for dizziness.  . methocarbamol (ROBAXIN) 500 MG tablet Take 1 tablet (500 mg total) by mouth every 8 (eight) hours as needed.  . montelukast (SINGULAIR) 10 MG tablet Take 1 tablet (10 mg total) by mouth at bedtime.  Marland Kitchen omeprazole (PRILOSEC) 40 MG capsule Take 1 capsule (40 mg total) by mouth daily.  Marland Kitchen PENNSAID 2 % SOLN Apply 2 (two) pumps topically to affected area(s) twice a day   No  facility-administered encounter medications on file as of 03/23/2019.     Immunization History  Administered Date(s) Administered  . Influenza Inj Mdck Quad Pf 11/08/2018  . Influenza Split 11/22/2016, 12/16/2016  . Influenza,inj,Quad PF,6+ Mos 10/20/2017  . Pneumococcal Polysaccharide-23 10/20/2017  . Tdap 09/06/2014     PFT's TLC  Date Value Ref Range Status  09/08/2017 4.58 L Final     CMP     Component Value Date/Time   NA 140 07/02/2018 1621   K 3.2 (L) 07/02/2018 1621   CL 105 07/02/2018 1621   CO2 25 07/02/2018 1621   GLUCOSE 91 07/02/2018 1621   BUN 11 07/02/2018 1621   CREATININE 0.81 07/02/2018 1621   CALCIUM 9.4 07/02/2018 1621   PROT 7.7 10/22/2017 1522   ALBUMIN 4.2 10/22/2017 1522   AST 19 10/22/2017 1522   ALT 21 10/22/2017 1522   ALKPHOS 69 10/22/2017 1522   BILITOT 0.4 10/22/2017 1522   GFRNONAA >60 07/02/2018 1621   GFRAA >60 07/02/2018 1621     CBC    Component Value Date/Time   WBC 5.9 07/02/2018 1621   RBC 4.15 07/02/2018 1621   HGB 12.6 07/02/2018 1621   HCT 37.7 07/02/2018 1621   PLT 348 07/02/2018 1621   MCV 90.8 07/02/2018 1621   MCH 30.4 07/02/2018 1621   MCHC 33.4 07/02/2018 1621   RDW 13.0 07/02/2018 1621        Assessment and Plan  1. Eliquis Medication Management  It is not clear who  is managing Eliquis and if she needs life-long anticoagulation.  Last refill in Epic April 2020 from [facility].  Poor coordination of care due to patient not having a PCP due to transition of moving from [place] to [place]. She was followed by vascular provider while in [place]. Patient established care with Caesar Chestnut, NP in September of 2019.  Last visit with their office was March 2020 and was instructed to continue Eliquis and a referral to hematology was placed. She has been out of medication for 1 month.  Will discuss with Gomez Cleverly, FNP and Dr. Halford Chessman about anticoagulation management.  Patient has Pharmacist, community and is eligible for co-pay  card.  Frederik Schmidt, CPhT assisted patient in signing up for co-pay card online and printed off card information. Prescription sent to [facility].  Patient was counseled on the purpose, proper use, and adverse effects of Eliquis including easy bruising, nosebleeds, gums bleeding when you brush your teeth and more bleeding than normal from small cut. Reviewed signs and symptoms of major bleeding including  Red or dark brown urine, red or black tarry stool,vomiting or coughing up blood, bruises that appear for no known reason, frequent nosebleeds, bleeding gums, or unusual bleeding and any bleeding that does not stop or is very heavy. Patient instructed to take Eliquis 5 mg twice a day.  Patient verbalized understanding.  Last CBC/CMP within normal limits in May 2020.  Repeat CBC/CMP order today.  2. Inhaler Medication Management  During medication reconciliation patient stated she was using Breo Ellipta as needed and proair daily. Reviewed appropriate use of maintenance vs rescue inhalers.  Stressed importance of using maintenance inhaler daily and rescue inhaler only as needed.  Patient verbalized understanding.  She states [facility] is unaffordable.  Breo Ellipta does not have a co-pay assistance card.  Patient switched to Airduo Respiclick as it has a co-pay card bringing her cost to $10 dollars a month.  Patient given handout and demonstration on inhaler use.  Patient verbalized understanding.  3. Medication Reconciliation  A drug regimen assessment was performed, including review of allergies, interactions, disease-state management, dosing and immunization history. Medications were reviewed with the patient, including name, instructions, indication, goals of therapy, potential side effects, importance of adherence, and safe use.  4. Immunizations  Patient is up-to-date on annual flu and pneumonia vaccines.  Recommend Shingrix and COVID-19 vaccines.    All questions encouraged and answered.   Instructed patient to call with any questions or concerns.  Thank you for allowing pharmacy to participate in this patient's care.  This appointment required  70 minutes of patient care (this includes precharting, chart review, review of results, face-to-face care, etc.).  Mariella Saa, PharmD, [place], [facility] Pharmacist [phone]  03/23/2019 2:01 PM

## 2019-03-23 ENCOUNTER — Other Ambulatory Visit: Payer: Self-pay

## 2019-03-23 ENCOUNTER — Ambulatory Visit (INDEPENDENT_AMBULATORY_CARE_PROVIDER_SITE_OTHER): Payer: 59 | Admitting: Pharmacist

## 2019-03-23 DIAGNOSIS — Z86711 Personal history of pulmonary embolism: Secondary | ICD-10-CM

## 2019-03-23 DIAGNOSIS — Z79899 Other long term (current) drug therapy: Secondary | ICD-10-CM

## 2019-03-23 DIAGNOSIS — Z5181 Encounter for therapeutic drug level monitoring: Secondary | ICD-10-CM | POA: Diagnosis not present

## 2019-03-23 DIAGNOSIS — J4540 Moderate persistent asthma, uncomplicated: Secondary | ICD-10-CM

## 2019-03-23 LAB — COMPREHENSIVE METABOLIC PANEL
ALT: 25 U/L (ref 0–35)
AST: 22 U/L (ref 0–37)
Albumin: 4.2 g/dL (ref 3.5–5.2)
Alkaline Phosphatase: 74 U/L (ref 39–117)
BUN: 15 mg/dL (ref 6–23)
CO2: 29 mEq/L (ref 19–32)
Calcium: 9.8 mg/dL (ref 8.4–10.5)
Chloride: 103 mEq/L (ref 96–112)
Creatinine, Ser: 0.76 mg/dL (ref 0.40–1.20)
GFR: 93.42 mL/min (ref >60.00)
Glucose, Bld: 83 mg/dL (ref 70–99)
Potassium: 3.7 mEq/L (ref 3.5–5.1)
Sodium: 138 mEq/L (ref 135–145)
Total Bilirubin: 0.5 mg/dL (ref 0.2–1.2)
Total Protein: 7.7 g/dL (ref 6.0–8.3)

## 2019-03-23 LAB — CBC WITH DIFFERENTIAL/PLATELET
Basophils Absolute: 0 10*3/uL (ref 0.0–0.1)
Basophils Relative: 0.8 % (ref 0.0–3.0)
Eosinophils Absolute: 0 10*3/uL (ref 0.0–0.7)
Eosinophils Relative: 0.1 % (ref 0.0–5.0)
HCT: 37.9 % (ref 36.0–46.0)
Hemoglobin: 12.8 g/dL (ref 12.0–15.0)
Lymphocytes Relative: 40.2 % (ref 12.0–46.0)
Lymphs Abs: 2 10*3/uL (ref 0.7–4.0)
MCHC: 33.8 g/dL (ref 30.0–36.0)
MCV: 90.9 fl (ref 78.0–100.0)
Monocytes Absolute: 0.4 10*3/uL (ref 0.1–1.0)
Monocytes Relative: 8.5 % (ref 3.0–12.0)
Neutro Abs: 2.5 10*3/uL (ref 1.4–7.7)
Neutrophils Relative %: 50.4 % (ref 43.0–77.0)
Platelets: 310 10*3/uL (ref 150.0–400.0)
RBC: 4.17 Mil/uL (ref 3.87–5.11)
RDW: 14 % (ref 11.5–15.5)
WBC: 5 10*3/uL (ref 4.0–10.5)

## 2019-03-23 MED ORDER — APIXABAN 5 MG PO TABS: 5.0000 mg | ORAL_TABLET | Freq: Two times a day (BID) | ORAL | 1 refills | Status: DC

## 2019-03-23 MED ORDER — AIRDUO DIGIHALER 113-14 MCG/ACT IN AEPB: 1.0000 | INHALATION_SPRAY | Freq: Two times a day (BID) | RESPIRATORY_TRACT | 2 refills | Status: DC

## 2019-03-25 ENCOUNTER — Telehealth: Payer: Self-pay

## 2019-03-25 NOTE — Telephone Encounter (Signed)
Yes, that's fine 

## 2019-03-25 NOTE — Telephone Encounter (Signed)
  Are you willing to accept Ms. Hefty as TOC?

## 2019-03-28 DIAGNOSIS — M79672 Pain in left foot: Secondary | ICD-10-CM | POA: Insufficient documentation

## 2019-03-30 ENCOUNTER — Other Ambulatory Visit: Payer: Self-pay

## 2019-03-30 ENCOUNTER — Ambulatory Visit (HOSPITAL_COMMUNITY): Payer: 59 | Attending: Orthopedic Surgery | Admitting: Physical Therapy

## 2019-03-30 ENCOUNTER — Encounter (HOSPITAL_COMMUNITY): Payer: Self-pay | Admitting: Physical Therapy

## 2019-03-30 DIAGNOSIS — M25672 Stiffness of left ankle, not elsewhere classified: Secondary | ICD-10-CM | POA: Insufficient documentation

## 2019-03-30 DIAGNOSIS — M25671 Stiffness of right ankle, not elsewhere classified: Secondary | ICD-10-CM | POA: Insufficient documentation

## 2019-03-30 DIAGNOSIS — M6281 Muscle weakness (generalized): Secondary | ICD-10-CM | POA: Diagnosis present

## 2019-03-30 DIAGNOSIS — R2689 Other abnormalities of gait and mobility: Secondary | ICD-10-CM | POA: Insufficient documentation

## 2019-03-30 NOTE — Telephone Encounter (Signed)
    Left message voicemail to call office for [facility] appointment

## 2019-03-30 NOTE — Therapy (Signed)
[place], [place], [address] Phone: [phone]   Fax:  [phone]  Physical Therapy Evaluation  Patient Details  Name: Isabel Garcia MRN: [id number] Date of Birth: 62/23/1957 Referring Provider (PT): Wylene Simmer, MD   Encounter Date: 03/30/2019  PT End of Session - 03/30/19 1549    Visit Number  1    Number of Visits  6    Date for PT Re-Evaluation  05/11/19    Authorization Type  [employer]    Authorization Time Period  03/30/19 - 05/11/19    Authorization - Visit Number  1    Authorization - Number of Visits  10    Progress Note Due on Visit  6    PT Start Time  1350    PT Stop Time  1430    PT Time Calculation (min)  40 min    Activity Tolerance  Patient limited by pain    Behavior During Therapy  [facility] for tasks assessed/performed       Past Medical History:  Diagnosis Date  . Arthritis   . Asthma   . Diverticulitis   . Diverticulosis   . GERD (gastroesophageal reflux disease)   . Heart murmur   . History of blood clots   . History of chicken pox   . History of pulmonary embolus (PE)   . Hypertension   . Liver cirrhosis secondary to NASH (nonalcoholic steatohepatitis) ([place])   . Phlebitis   . Vertigo     Past Surgical History:  Procedure Laterality Date  . ABDOMINAL HYSTERECTOMY    . KNEE ARTHROSCOPY Right   . KNEE ARTHROSCOPY Right     There were no vitals filed for this visit.   Subjective Assessment - 03/30/19 1359    Subjective  Patient reported that about 2 years ago in 2018 she started having achilles tendonitis, posterior tibialis tendinitis, knee pain, and plantar fasciitis on the right. And now it is starting on the left side. She reported that she has had boots for both feet and been in a cast before. She reported that she used to do aquatic exercise and that this helped. Reported having a bone spur on the left foot. She also reported a history of having had blood clots in her LEs in  2019 which traveled to her lungs and she had to come out of work.    Pertinent History  Removal of bone spur on the right side    Limitations  Standing;Walking;Sitting    How long can you sit comfortably?  30 minutes because feet start swelling    How long can you stand comfortably?  5 minutes    How long can you walk comfortably?  5 minutes    Patient Stated Goals  To have less pain    Currently in Pain?  Yes    Pain Score  5     Pain Location  Ankle    Pain Orientation  Right;Left    Pain Descriptors / Indicators  Throbbing;Aching    Pain Type  Chronic pain    Pain Onset  More than a month ago    Pain Frequency  Constant    Aggravating Factors   Standing and walking    Pain Relieving Factors  Getting off of her feet    Effect of Pain on Daily Activities  Severely limits         [facility] PT Assessment - 03/30/19 0001  Assessment   Medical Diagnosis  Posterior tibialis tendinitus    Referring Provider (PT)  Wylene Simmer, MD    Onset Date/Surgical Date  --   Since about 2018   Next MD Visit  --   Sometime next month   Prior Therapy  Yes, for feet and ankles and back      Precautions   Precautions  None    Required Braces or Orthoses  Other Brace/Splint    Other Brace/Splint  Wearing bilateral ASO braces when it is bothering her      Restrictions   Weight Bearing Restrictions  No      Balance Screen   Has the patient fallen in the past 6 months  No    Has the patient had a decrease in activity level because of a fear of falling?   Yes    Is the patient reluctant to leave their home because of a fear of falling?   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      Cognition   Overall Cognitive Status  Within Functional Limits for tasks assessed      Observation/Other Assessments   Focus on Therapeutic Outcomes (FOTO)   38%      Sensation   Light Touch  Appears Intact      ROM / Strength   AROM / PROM / Strength  AROM;Strength      AROM   AROM Assessment Site  Ankle    Right/Left Ankle  Right;Left    Right Ankle Dorsiflexion  -8    Right Ankle Plantar Flexion  48    Right Ankle Inversion  23    Right Ankle Eversion  21    Left Ankle Dorsiflexion  -5    Left Ankle Plantar Flexion  39    Left Ankle Inversion  20    Left Ankle Eversion  20      Strength   Strength Assessment Site  Hip;Knee;Ankle    Right/Left Hip  --    Right/Left Knee  Right;Left    Right Knee Flexion  4+/5    Right Knee Extension  4+/5    Left Knee Flexion  5/5    Left Knee Extension  5/5    Right/Left Ankle  Right;Left    Right Ankle Dorsiflexion  4/5    Left Ankle Dorsiflexion  4/5      Palpation   Palpation comment  LT: TTP medial side of foot at achilles tendon and at posterior tibialis tendon and mm belly. Not tender on lateral side. RT: TTP of retinaculum on dorsal aspect of ankle      Ambulation/Gait   Ambulation Distance (Feet)  20 Feet    Gait Pattern  Decreased dorsiflexion - right;Decreased dorsiflexion - left    Gait Comments  Decreased arch bilaterally, increased navicular drop  Objective measurements completed on examination: See above findings.              PT Education - 03/30/19 1548    Education Details  Discussed examination findings, and initial POC.    Person(s) Educated  Patient    Methods  Explanation    Comprehension  Verbalized understanding       PT Short Term Goals - 03/30/19 1550      PT SHORT TERM GOAL #1   Title  Patient will report understanding and  regular compliance with HEP to improve mobility, strength, and overall functional mobility.    Time  3    Period  Weeks    Status  New    Target Date  04/20/19        PT Long Term Goals - 03/30/19 1551      PT LONG TERM GOAL #1   Title  Patient will demonstrated bilateral ankle AROM which is WFL to improve gait mechanics and improve overall functional mobility.    Time  6    Period  Weeks    Status  New    Target Date  05/11/19      PT LONG TERM GOAL #2   Title  Patient will report that her symptoms have improved overall by at least 50% to improve her overall QOL.    Time  6    Period  Weeks    Status  New    Target Date  05/11/19      PT LONG TERM GOAL #3   Title  Patient will report ability to walk for at least 15 minutes in order to perform grocery shopping and household chores with improved ease.    Time  6    Period  Weeks    Status  New    Target Date  05/11/19             Plan - 03/30/19 1554    Clinical Impression Statement  Patient is a 63 year old female who presents to outpatient physical therapy with primary complaint of bilateral foot and ankle pain which has been ongoing for several years. Upon examination, noted decreased bilateral ankle mobility and decreased strength. In addition, noted with ambulation that patient has significantly reduced bilateral arches and navicular drop. In addition noted restrictions and pain reported through posterior tibialis and achilles. Patient would benefit from skilled physical therapy in order to address the abovementioned deficits and help patient return to PLOF.    Personal Factors and Comorbidities  Time since onset of injury/illness/exacerbation;Comorbidity 2    Comorbidities  Hx of DVT, HTN    Examination-Activity Limitations  Stand;Locomotion Level;Transfers;Squat;Stairs    Examination-Participation Restrictions  Yard Work;Cleaning;Meal Prep;Community Activity    Stability/Clinical Decision Making  Evolving/Moderate  complexity    Clinical Decision Making  Moderate    Rehab Potential  Good    PT Frequency  1x / week   Limited due to financial restrictions per patient request   PT Duration  6 weeks    PT Treatment/Interventions  ADLs/Self Care Home Management;Aquatic Therapy;Cryotherapy;Electrical Stimulation;Moist Heat;DME Instruction;Gait training;Stair training;Functional mobility training;Therapeutic activities;Therapeutic exercise;Balance training;Neuromuscular re-education;Patient/family education;Orthotic Fit/Training;Manual techniques;Passive range of motion;Dry needling;Energy conservation;Splinting;Taping    PT Next Visit Plan  Review eval/goals. Provide HEP focusing on eccentric strengthening, ankle mobility. Focus on manual to reduce muscular restrictions and improve mobility. Ask lit/ab qs.    PT Home Exercise Plan  Initiate at first session    Consulted and Agree with Plan  of Care  Patient       Patient will benefit from skilled therapeutic intervention in order to improve the following deficits and impairments:  Abnormal gait, Increased fascial restricitons, Improper body mechanics, Pain, Decreased mobility, Decreased activity tolerance, Decreased endurance, Decreased range of motion, Decreased strength, Hypomobility, Difficulty walking  Visit Diagnosis: Stiffness of left ankle, not elsewhere classified  Stiffness of right ankle, not elsewhere classified  Muscle weakness (generalized)  Other abnormalities of gait and mobility     Problem List Patient Active Problem List   Diagnosis Date Noted  . History of DVT (deep vein thrombosis) 04/26/2018  . Current use of long term anticoagulation 04/26/2018  . 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 Chronic pain of right knee 10/22/2017  . Right foot pain 10/22/2017  . Neck pain 10/22/2017  . Obesity (BMI 35.0-39.9 without comorbidity) 10/22/2017  . Healthcare maintenance 10/20/2017  . History of pulmonary embolus  (PE) 10/20/2017  . GERD (gastroesophageal reflux disease) 09/08/2017  . Cough 08/11/2017  . Essential hypertension 08/11/2017  . Obstructive sleep apnea 08/11/2017   Clarene Critchley PT, DPT 4:01 PM, 03/30/19 [facility] [id number] [place], [place], [address] Phone: [phone]   Fax:  [phone]  Name: Lindaann Gradilla MRN: [id number] Date of Birth: 06-24-1955

## 2019-04-01 ENCOUNTER — Ambulatory Visit: Payer: Self-pay | Admitting: Pulmonary Disease

## 2019-04-07 ENCOUNTER — Other Ambulatory Visit: Payer: Self-pay

## 2019-04-07 ENCOUNTER — Ambulatory Visit (HOSPITAL_COMMUNITY): Payer: 59 | Attending: Orthopedic Surgery | Admitting: Physical Therapy

## 2019-04-07 ENCOUNTER — Encounter (HOSPITAL_COMMUNITY): Payer: Self-pay | Admitting: Physical Therapy

## 2019-04-07 DIAGNOSIS — M25672 Stiffness of left ankle, not elsewhere classified: Secondary | ICD-10-CM | POA: Insufficient documentation

## 2019-04-07 DIAGNOSIS — M6281 Muscle weakness (generalized): Secondary | ICD-10-CM | POA: Diagnosis present

## 2019-04-07 DIAGNOSIS — R2689 Other abnormalities of gait and mobility: Secondary | ICD-10-CM | POA: Diagnosis present

## 2019-04-07 DIAGNOSIS — M25671 Stiffness of right ankle, not elsewhere classified: Secondary | ICD-10-CM | POA: Insufficient documentation

## 2019-04-07 NOTE — Therapy (Signed)
[facility] [place], [place], [address] Phone: [phone]   Fax:  [phone]  Physical Therapy Treatment  Patient Details  Name: Isabel Garcia MRN: [id number] Date of Birth: 1955/06/11 Referring Provider (PT): Wylene Simmer, MD   Encounter Date: 04/07/2019  PT End of Session - 04/07/19 1307    Visit Number  2    Number of Visits  6    Date for PT Re-Evaluation  05/11/19    Authorization Type  [employer]    Authorization Time Period  03/30/19 - 05/11/19    Authorization - Visit Number  2    Authorization - Number of Visits  10    Progress Note Due on Visit  6    PT Start Time  1122    PT Stop Time  1203    PT Time Calculation (min)  41 min    Activity Tolerance  Patient limited by pain    Behavior During Therapy  [facility] for tasks assessed/performed       Past Medical History:  Diagnosis Date  . Arthritis   . Asthma   . Diverticulitis   . Diverticulosis   . GERD (gastroesophageal reflux disease)   . Heart murmur   . History of blood clots   . History of chicken pox   . History of pulmonary embolus (PE)   . Hypertension   . Liver cirrhosis secondary to NASH (nonalcoholic steatohepatitis) ([place])   . Phlebitis   . Vertigo     Past Surgical History:  Procedure Laterality Date  . ABDOMINAL HYSTERECTOMY    . KNEE ARTHROSCOPY Right   . KNEE ARTHROSCOPY Right     There were no vitals filed for this visit.  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 She reported an interest in starting aquatic therapy. She said her foot is hurting her at a 5/10.    Pertinent History  Removal of bone spur on the right side    Limitations  Standing;Walking;Sitting    How long can you sit comfortably?  30 minutes because feet start swelling    How long can you stand comfortably?  5 minutes    How  long can you walk comfortably?  5 minutes    Patient Stated Goals  To have less pain    Currently in Pain?  Yes    Pain Score  5     Pain Location  Ankle    Pain Orientation  Right    Pain Descriptors / Indicators  Throbbing    Pain Type  Chronic pain    Pain Onset  More than a month ago                       [facility] Adult PT Treatment/Exercise - 04/07/19 0001      Exercises   Exercises  Ankle      Ankle Exercises: Standing   Heel Raises  Both;3 seconds    Heel Raises Limitations  2 sets of 12 on 4'' step      Ankle Exercises: Seated   ABC's  1 rep    ABC's Limitations  each LE    Other Seated Ankle Exercises  SLR x10 each LE      Ankle Exercises: Sidelying   Other Sidelying Ankle Exercises  Hip  abduction x10 each LE             PT Education - 04/07/19 1306    Education Details  Educated on HEP, discussed possible aquatic therapy, and discussed purpose and technique of interventions throughout session.    Person(s) Educated  Patient    Methods  Explanation;Handout    Comprehension  Verbalized understanding       PT Short Term Goals - 04/07/19 1135      PT SHORT TERM GOAL #1   Title  Patient will report understanding and regular compliance with HEP to improve mobility, strength, and overall functional mobility.    Time  3    Period  Weeks    Status  On-going    Target Date  04/20/19        PT Long Term Goals - 04/07/19 1136      PT LONG TERM GOAL #1   Title  Patient will demonstrated bilateral ankle AROM which is WFL to improve gait mechanics and improve overall functional mobility.    Time  6    Period  Weeks    Status  On-going      PT LONG TERM GOAL #2   Title  Patient will report that her symptoms have improved overall by at least 50% to improve her overall QOL.    Time  6    Period  Weeks    Status  On-going      PT LONG TERM GOAL #3   Title  Patient will report ability to walk for at least 15 minutes in order to perform grocery  shopping and household chores with improved ease.    Time  6    Period  Weeks    Status  On-going            Plan - 04/07/19 1438    Clinical Impression Statement  Began by reviewing patient's evaluation and goals. Discussed possible benefits of aquatic therapy and plan to begin this next session as able. Educated patient on initial HEP including ankle mobility, hip strengthening, and eccentric ankle strengthening. Patient required cueing to improve mechanics with exercises. Patient would benefit from continued skilled physical therapy to continue progressing towards functional goals.    Personal Factors and Comorbidities  Time since onset of injury/illness/exacerbation;Comorbidity 2    Comorbidities  Hx of DVT, HTN    Examination-Activity Limitations  Stand;Locomotion Level;Transfers;Squat;Stairs    Examination-Participation Restrictions  Yard Work;Cleaning;Meal Prep;Community Activity    Stability/Clinical Decision Making  Evolving/Moderate complexity    Rehab Potential  Good    PT Frequency  1x / week   Limited due to financial restrictions per patient request   PT Duration  6 weeks    PT Treatment/Interventions  ADLs/Self Care Home Management;Aquatic Therapy;Cryotherapy;Electrical Stimulation;Moist Heat;DME Instruction;Gait training;Stair training;Functional mobility training;Therapeutic activities;Therapeutic exercise;Balance training;Neuromuscular re-education;Patient/family education;Orthotic Fit/Training;Manual techniques;Passive range of motion;Dry needling;Energy conservation;Splinting;Taping    PT Next Visit Plan  Focus on manual to reduce muscular restrictions and improve mobility. Ask lit/ab qs. Possible aquatic therapy working on LE strength, ankle mobility, and weightbearing    PT Home Exercise Plan  04/07/19: Ankle abcs, hip abduction, hip SLR, heel raise on step    Consulted and Agree with Plan of Care  Patient       Patient will benefit from skilled therapeutic  intervention in order to improve the following deficits and impairments:  Abnormal gait, Increased fascial restricitons, Improper body mechanics, Pain, Decreased mobility, Decreased activity tolerance, Decreased endurance, Decreased range  of motion, Decreased strength, Hypomobility, Difficulty walking  Visit Diagnosis: Stiffness of left ankle, not elsewhere classified  Stiffness of right ankle, not elsewhere classified  Muscle weakness (generalized)  Other abnormalities of gait and mobility     Problem List Patient Active Problem List   Diagnosis Date Noted  . History of DVT (deep vein thrombosis) 04/26/2018  . Current use of long term anticoagulation 04/26/2018  . 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 Chronic pain of right knee 10/22/2017  . Right foot pain 10/22/2017  . Neck pain 10/22/2017  . Obesity (BMI 35.0-39.9 without comorbidity) 10/22/2017  . Healthcare maintenance 10/20/2017  . History of pulmonary embolus (PE) 10/20/2017  . GERD (gastroesophageal reflux disease) 09/08/2017  . Cough 08/11/2017  . Essential hypertension 08/11/2017  . Obstructive sleep apnea 08/11/2017   Clarene Critchley PT, DPT 2:46 PM, 04/07/19 [place] [address] [place], [place], [address] Phone: [phone]   Fax:  [phone]  Name: Isabel Garcia MRN: [id number] Date of Birth: 1955/12/01

## 2019-04-07 NOTE — Patient Instructions (Addendum)
*  ABCs with ankle 1 time each foot  Straight Leg Raise    Tighten stomach and slowly raise locked right leg __12__ inches from floor. Repeat _15___ times per set. Do _2___ sets per session. Do __1__ sessions per day.  http://orth.exer.us/1103   Copyright  VHI. All rights reserved.  HIP: Abduction - Side-Lying    Lie on side, legs straight and in line with trunk. Squeeze glutes. Raise top leg up and slightly back. Point toes forward. _15__ reps per set, _2__ sets per day, _7__ days per week Bend bottom leg to stabilize pelvis.  Copyright  VHI. All rights reserved.  Heel Raise: Standing    Toes on board, heels on floor, knees slightly bent, rise up on toes as high as possible. Do _2___ sets. Complete _12___ repetitions. Stand by handrail to hold on for balance as needed.   http://st.exer.us/133   Copyright  VHI. All rights reserved.

## 2019-04-12 ENCOUNTER — Other Ambulatory Visit: Payer: Self-pay

## 2019-04-12 ENCOUNTER — Ambulatory Visit: Payer: 59 | Admitting: Family

## 2019-04-12 ENCOUNTER — Encounter: Payer: Self-pay | Admitting: Family

## 2019-04-12 VITALS — BP 128/84 | HR 62 | Temp 98.4°F | Ht 66.0 in | Wt 215.1 lb

## 2019-04-12 DIAGNOSIS — R059 Cough, unspecified: Secondary | ICD-10-CM

## 2019-04-12 DIAGNOSIS — Z1231 Encounter for screening mammogram for malignant neoplasm of breast: Secondary | ICD-10-CM

## 2019-04-12 DIAGNOSIS — R05 Cough: Secondary | ICD-10-CM | POA: Diagnosis not present

## 2019-04-12 DIAGNOSIS — Z86711 Personal history of pulmonary embolism: Secondary | ICD-10-CM | POA: Diagnosis not present

## 2019-04-12 DIAGNOSIS — Z23 Encounter for immunization: Secondary | ICD-10-CM | POA: Diagnosis not present

## 2019-04-12 DIAGNOSIS — R5383 Other fatigue: Secondary | ICD-10-CM | POA: Diagnosis not present

## 2019-04-12 DIAGNOSIS — I10 Essential (primary) hypertension: Secondary | ICD-10-CM | POA: Diagnosis not present

## 2019-04-12 DIAGNOSIS — Z1159 Encounter for screening for other viral diseases: Secondary | ICD-10-CM

## 2019-04-12 DIAGNOSIS — E559 Vitamin D deficiency, unspecified: Secondary | ICD-10-CM

## 2019-04-12 DIAGNOSIS — K219 Gastro-esophageal reflux disease without esophagitis: Secondary | ICD-10-CM

## 2019-04-12 DIAGNOSIS — Z1211 Encounter for screening for malignant neoplasm of colon: Secondary | ICD-10-CM

## 2019-04-12 DIAGNOSIS — Z114 Encounter for screening for human immunodeficiency virus [HIV]: Secondary | ICD-10-CM

## 2019-04-12 DIAGNOSIS — Z13220 Encounter for screening for lipoid disorders: Secondary | ICD-10-CM | POA: Diagnosis not present

## 2019-04-12 LAB — CBC WITH DIFFERENTIAL/PLATELET
Basophils Absolute: 0 10*3/uL (ref 0.0–0.1)
Basophils Relative: 0.6 % (ref 0.0–3.0)
Eosinophils Absolute: 0 10*3/uL (ref 0.0–0.7)
Eosinophils Relative: 0 % (ref 0.0–5.0)
HCT: 38.4 % (ref 36.0–46.0)
Hemoglobin: 13.2 g/dL (ref 12.0–15.0)
Lymphocytes Relative: 45.5 % (ref 12.0–46.0)
Lymphs Abs: 1.9 10*3/uL (ref 0.7–4.0)
MCHC: 34.4 g/dL (ref 30.0–36.0)
MCV: 90.5 fl (ref 78.0–100.0)
Monocytes Absolute: 0.4 10*3/uL (ref 0.1–1.0)
Monocytes Relative: 9.1 % (ref 3.0–12.0)
Neutro Abs: 1.9 10*3/uL (ref 1.4–7.7)
Neutrophils Relative %: 44.8 % (ref 43.0–77.0)
Platelets: 296 10*3/uL (ref 150.0–400.0)
RBC: 4.24 Mil/uL (ref 3.87–5.11)
RDW: 13.6 % (ref 11.5–15.5)
WBC: 4.2 10*3/uL (ref 4.0–10.5)

## 2019-04-12 LAB — COMPREHENSIVE METABOLIC PANEL
ALT: 25 U/L (ref 0–35)
AST: 22 U/L (ref 0–37)
Albumin: 4.2 g/dL (ref 3.5–5.2)
Alkaline Phosphatase: 76 U/L (ref 39–117)
BUN: 10 mg/dL (ref 6–23)
CO2: 28 mEq/L (ref 19–32)
Calcium: 9.8 mg/dL (ref 8.4–10.5)
Chloride: 104 mEq/L (ref 96–112)
Creatinine, Ser: 0.8 mg/dL (ref 0.40–1.20)
GFR: 88.04 mL/min (ref >60.00)
Glucose, Bld: 94 mg/dL (ref 70–99)
Potassium: 4 mEq/L (ref 3.5–5.1)
Sodium: 138 mEq/L (ref 135–145)
Total Bilirubin: 0.6 mg/dL (ref 0.2–1.2)
Total Protein: 7.8 g/dL (ref 6.0–8.3)

## 2019-04-12 LAB — LIPID PANEL
Cholesterol: 200 mg/dL (ref 0–200)
HDL: 51.6 mg/dL (ref >39.00)
LDL Cholesterol: 128 mg/dL — ABNORMAL HIGH (ref 0–99)
NonHDL: 148.83
Total CHOL/HDL Ratio: 4
Triglycerides: 102 mg/dL (ref 0.0–149.0)
VLDL: 20.4 mg/dL (ref 0.0–40.0)

## 2019-04-12 LAB — TSH: TSH: 0.85 u[IU]/mL (ref 0.35–4.50)

## 2019-04-12 LAB — VITAMIN D 25 HYDROXY (VIT D DEFICIENCY, FRACTURES): VITD: 33.95 ng/mL (ref 30.00–100.00)

## 2019-04-12 MED ORDER — OMEPRAZOLE 40 MG PO CPDR: 40.0000 mg | DELAYED_RELEASE_CAPSULE | Freq: Every day | ORAL | 3 refills | Status: DC

## 2019-04-12 MED ORDER — AMLODIPINE BESYLATE 10 MG PO TABS: 10.0000 mg | ORAL_TABLET | Freq: Every day | ORAL | 11 refills | Status: DC

## 2019-04-12 MED ORDER — FLUTICASONE PROPIONATE 50 MCG/ACT NA SUSP: 2.0000 | Freq: Every day | NASAL | 11 refills | Status: AC

## 2019-04-12 MED ORDER — MONTELUKAST SODIUM 10 MG PO TABS: 10.0000 mg | ORAL_TABLET | Freq: Every day | ORAL | 2 refills | Status: DC

## 2019-04-12 NOTE — Addendum Note (Signed)
Addended by: Breck Coons on: 04/12/2019 03:52 PM   Modules accepted: Orders

## 2019-04-12 NOTE — Progress Notes (Signed)
Isabel Garcia is a 64 y.o. female with the following history as recorded in [employer]:  Patient Active Problem List   Diagnosis Date Noted  . History of DVT (deep vein thrombosis) 04/26/2018  . Current use of long term anticoagulation 04/26/2018  . 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 Chronic pain of right knee 10/22/2017  . Right foot pain 10/22/2017  . Neck pain 10/22/2017  . Obesity (BMI 35.0-39.9 without comorbidity) 10/22/2017  . Healthcare maintenance 10/20/2017  . History of pulmonary embolus (PE) 10/20/2017  . GERD (gastroesophageal reflux disease) 09/08/2017  . Cough 08/11/2017  . Essential hypertension 08/11/2017  . Obstructive sleep apnea 08/11/2017    Current Outpatient Medications  Medication Sig Dispense Refill  . albuterol (PROVENTIL HFA) 108 (90 Base) MCG/ACT inhaler Inhale 2 puffs into the lungs every 6 (six) hours as needed.    Marland Kitchen amLODipine (NORVASC) 10 MG tablet Take 1 tablet (10 mg total) by mouth daily. for high blood pressure 30 tablet 11  . apixaban (ELIQUIS) 5 MG TABS tablet Take 1 tablet (5 mg total) by mouth 2 (two) times daily. 60 tablet 1  . diclofenac Sodium (VOLTAREN) 1 % GEL     . fluticasone (FLONASE) 50 MCG/ACT nasal spray Place 2 sprays into both nostrils daily. 16 g 11  . Fluticasone-Salmeterol,sensor, (AIRDUO DIGIHALER) 113-14 MCG/ACT AEPB Inhale 1 puff into the lungs 2 (two) times daily. 1 each 2  . meclizine (ANTIVERT) 25 MG tablet Take 1 tablet (25 mg total) by mouth 3 (three) times daily as needed for dizziness. 30 tablet 0  . montelukast (SINGULAIR) 10 MG tablet Take 1 tablet (10 mg total) by mouth at bedtime. 30 tablet 2  . omeprazole (PRILOSEC) 40 MG capsule Take 1 capsule (40 mg total) by mouth daily. 30 capsule 3  . PENNSAID 2 % SOLN Apply 2 (two) pumps topically to affected area(s) twice a day     No current facility-administered medications for this visit.    Allergies: Patient has no known allergies.  Past Medical  History:  Diagnosis Date  . Arthritis   . Asthma   . Diverticulitis   . Diverticulosis   . GERD (gastroesophageal reflux disease)   . Heart murmur   . History of blood clots   . History of chicken pox   . History of pulmonary embolus (PE)   . Hypertension   . Liver cirrhosis secondary to NASH (nonalcoholic steatohepatitis) ([place])   . Phlebitis   . Vertigo     Past Surgical History:  Procedure Laterality Date  . ABDOMINAL HYSTERECTOMY    . KNEE ARTHROSCOPY Right   . KNEE ARTHROSCOPY Right     Family History  Problem Relation Age of Onset  . Arthritis Mother   . Hypertension Mother   . Hypertension Father   . Heart attack Father   . Early death Father   . Asthma Sister   . Heart attack Brother   . Early death Brother   . Hypertension Brother   . Stroke Maternal Grandmother   . Heart attack Paternal Grandfather   . Early death Paternal Grandfather     Social History   Tobacco Use  . Smoking status: Never Smoker  . Smokeless tobacco: Never Used  Substance Use Topics  . Alcohol use: Yes    Comment: wine occasionaly    Subjective:  Patient presents for follow-up on chronic needs; 1) History of HTN- needs refill on Amlodipine; 2) History of PE- needs referral to  hematology to discuss stopping Eliquis; 3) History of asthma- under care of pulmonology 4) History of sleep apnea- under care of pulmonology;  5) History of GERD- needs refill on Prilosec 40 mg daily; 6) History of parital hysterectomy- last pap smear done in 2017;  Overdue for mammogram and colonoscopy;   Objective:  Vitals:   04/12/19 1022  BP: 128/84  Pulse: 62  Temp: 98.4 F (36.9 C)  TempSrc: Oral  SpO2: 99%  Weight: 215 lb 2 oz (97.6 kg)  Height: 5' 6"  (1.676 m)    General: Well developed, well nourished, in no acute distress  Skin : Warm and dry.  Head: Normocephalic and atraumatic  Eyes: Sclera and conjunctiva clear; pupils round and reactive to light; extraocular movements intact   Ears: External normal; canals clear; tympanic membranes normal  Oropharynx: Pink, supple. No suspicious lesions  Neck: Supple without thyromegaly, adenopathy  Lungs: Respirations unlabored; clear to auscultation bilaterally without wheeze, rales, rhonchi  CVS exam: normal rate and regular rhythm.  Neurologic: Alert and oriented; speech intact; face symmetrical; moves all extremities well; CNII-XII intact without focal deficit   Assessment:  1. Essential hypertension   2. Cough   3. Gastroesophageal reflux disease, unspecified whether esophagitis present   4. History of pulmonary embolus (PE)   5. Screening mammogram, encounter for   6. Lipid screening   7. Other fatigue   8. Vitamin D deficiency   9. Encounter for screening colonoscopy   10. Need for hepatitis C screening test   11. Encounter for screening for HIV     Plan:  Labs and refills updated; order for mammogram, colonoscopy updated; Shingrix # 1 given- due for 2nd vaccine in 2 months;  She needs to see her pulmonologist in May 2021; Referral to hematology to discuss need to stay on Eliquis;  This visit occurred during the SARS-CoV-2 public health emergency.  Safety protocols were in place, including screening questions prior to the visit, additional usage of staff PPE, and extensive cleaning of exam room while observing appropriate contact time as indicated for disinfecting solutions.     No follow-ups on file.  Orders Placed This Encounter  Procedures  . MM Digital Screening    Standing Status:   Future    Standing Expiration Date:   06/11/2020    Order Specific Question:   Reason for Exam (SYMPTOM  OR DIAGNOSIS REQUIRED)    Answer:   screening mammogram    Order Specific Question:   Preferred imaging location?    Answer:   [facility]  . CBC w/Diff  . Comp Met (CMET)  . Lipid panel  . TSH  . Vitamin D (25 hydroxy)  . HEP C AB W/REFL  . HIV Antibody (routine testing w rflx)  . Ambulatory referral to  Hematology    Referral Priority:   Routine    Referral Type:   Consultation    Referral Reason:   Specialty Services Required    Requested Specialty:   Oncology    Number of Visits Requested:   1  . Ambulatory referral to Gastroenterology    Referral Priority:   Routine    Referral Type:   Consultation    Referral Reason:   Specialty Services Required    Number of Visits Requested:   1  . Ambulatory referral to Gastroenterology    Referral Priority:   Routine    Referral Type:   Consultation    Referral Reason:   Specialty Services Required  Number of Visits Requested:   1    Requested Prescriptions   Signed Prescriptions Disp Refills  . fluticasone (FLONASE) 50 MCG/ACT nasal spray 16 g 11    Sig: Place 2 sprays into both nostrils daily.  . montelukast (SINGULAIR) 10 MG tablet 30 tablet 2    Sig: Take 1 tablet (10 mg total) by mouth at bedtime.  Marland Kitchen omeprazole (PRILOSEC) 40 MG capsule 30 capsule 3    Sig: Take 1 capsule (40 mg total) by mouth daily.  Marland Kitchen amLODipine (NORVASC) 10 MG tablet 30 tablet 11    Sig: Take 1 tablet (10 mg total) by mouth daily. for high blood pressure

## 2019-04-13 LAB — HEP C AB W/REFL
HEPATITIS C ANTIBODY REFILL$(REFL): NONREACTIVE
SIGNAL TO CUT-OFF: 0.01 (ref <1.00)

## 2019-04-13 LAB — REFLEX TIQ

## 2019-04-13 LAB — HIV ANTIBODY (ROUTINE TESTING W REFLEX): HIV 1&2 Ab, 4th Generation: NONREACTIVE

## 2019-04-14 ENCOUNTER — Ambulatory Visit (HOSPITAL_COMMUNITY): Payer: 59 | Admitting: Physical Therapy

## 2019-04-14 ENCOUNTER — Other Ambulatory Visit: Payer: Self-pay

## 2019-04-14 ENCOUNTER — Encounter (HOSPITAL_COMMUNITY): Payer: Self-pay | Admitting: Physical Therapy

## 2019-04-14 DIAGNOSIS — R2689 Other abnormalities of gait and mobility: Secondary | ICD-10-CM

## 2019-04-14 DIAGNOSIS — M25672 Stiffness of left ankle, not elsewhere classified: Secondary | ICD-10-CM | POA: Diagnosis not present

## 2019-04-14 DIAGNOSIS — M6281 Muscle weakness (generalized): Secondary | ICD-10-CM

## 2019-04-14 DIAGNOSIS — M25671 Stiffness of right ankle, not elsewhere classified: Secondary | ICD-10-CM

## 2019-04-14 NOTE — Therapy (Signed)
[place], [place], [address] Phone: [phone]   Fax:  [phone]  Physical Therapy Treatment  Patient Details  Name: Isabel Garcia MRN: [id number] Date of Birth: 1955/04/11 Referring Provider (PT): Wylene Simmer, MD   Encounter Date: 04/14/2019  PT End of Session - 04/14/19 1854    Visit Number  3    Number of Visits  6    Date for PT Re-Evaluation  05/11/19    Authorization Type  [employer]    Authorization Time Period  03/30/19 - 05/11/19    Authorization - Visit Number  3    Authorization - Number of Visits  10    Progress Note Due on Visit  6    PT Start Time  8182    PT Stop Time  1348    PT Time Calculation (min)  45 min    Activity Tolerance  Patient tolerated treatment well    Behavior During Therapy  [facility] for tasks assessed/performed       Past Medical History:  Diagnosis Date  . Arthritis   . Asthma   . Diverticulitis   . Diverticulosis   . GERD (gastroesophageal reflux disease)   . Heart murmur   . History of blood clots   . History of chicken pox   . History of pulmonary embolus (PE)   . Hypertension   . Liver cirrhosis secondary to NASH (nonalcoholic steatohepatitis) ([place])   . Phlebitis   . Vertigo     Past Surgical History:  Procedure Laterality Date  . ABDOMINAL HYSTERECTOMY    . KNEE ARTHROSCOPY Right   . KNEE ARTHROSCOPY Right     There were no vitals filed for this visit.  Subjective Assessment - 04/14/19 1852    Subjective  Patient reports ongoing pain in LT foot and also RT knee. Says she had recent follow up with ortho MD and was recommended RT knee replacement.    Pertinent History  Removal of bone spur on the right side    Limitations  Standing;Walking;Sitting    How long can you sit comfortably?  30 minutes because feet start swelling    How long can you stand comfortably?  5 minutes    How long can you walk comfortably?  5 minutes    Patient Stated Goals  To have  less pain    Currently in Pain?  Yes    Pain Score  4     Pain Location  Ankle    Pain Orientation  Left    Pain Descriptors / Indicators  Aching    Pain Type  Chronic pain    Pain Onset  More than a month ago                   Adult Aquatic Therapy - 04/14/19 1858      Treatment   Gait  dynamic warm up, normal walk wiht cues for heel to toe transtion, high knee march, straight leg march 30' 2RT each    Exercises  heel raises x20, minisquats at wall x20, standing hip abduction x20, cals stretch at wall 3 x 30", quad stretch using pool noodle 3 x 30"     Balance  SLS 3 x 20" each                   PT Short Term Goals - 04/07/19 1135      PT SHORT TERM GOAL #1  Title  Patient will report understanding and regular compliance with HEP to improve mobility, strength, and overall functional mobility.    Time  3    Period  Weeks    Status  On-going    Target Date  04/20/19        PT Long Term Goals - 04/07/19 1136      PT LONG TERM GOAL #1   Title  Patient will demonstrated bilateral ankle AROM which is WFL to improve gait mechanics and improve overall functional mobility.    Time  6    Period  Weeks    Status  On-going      PT LONG TERM GOAL #2   Title  Patient will report that her symptoms have improved overall by at least 50% to improve her overall QOL.    Time  6    Period  Weeks    Status  On-going      PT LONG TERM GOAL #3   Title  Patient will report ability to walk for at least 15 minutes in order to perform grocery shopping and household chores with improved ease.    Time  6    Period  Weeks    Status  On-going            Plan - 04/14/19 1854    Clinical Impression Statement  Initiated aquatic therapy this visit. Patient tolerated session well. Patient educated on benefit of aquatic therapy for progressing strength and mobility in gravity reduced environment.  Started with active warmup for improved LE mobility.  Focused on gentle  stretching at ankles and knees. Added body weight strengthening exercise to patient tolerance. Patient cued on proper form and function with all added exercise, and to perform exerciser through pain free ROM. Patient able to perform squatting, heel raise and SLS in pool with decreased complaint of pain. Patient noted decreased pain and improved knee and ankle, mobility post treatment.    Personal Factors and Comorbidities  Time since onset of injury/illness/exacerbation;Comorbidity 2    Comorbidities  Hx of DVT, HTN    Examination-Activity Limitations  Stand;Locomotion Level;Transfers;Squat;Stairs    Examination-Participation Restrictions  Yard Work;Cleaning;Meal Prep;Community Activity    Stability/Clinical Decision Making  Evolving/Moderate complexity    Rehab Potential  Good    PT Frequency  1x / week   Limited due to financial restrictions per patient request   PT Duration  6 weeks    PT Treatment/Interventions  ADLs/Self Care Home Management;Aquatic Therapy;Cryotherapy;Electrical Stimulation;Moist Heat;DME Instruction;Gait training;Stair training;Functional mobility training;Therapeutic activities;Therapeutic exercise;Balance training;Neuromuscular re-education;Patient/family education;Orthotic Fit/Training;Manual techniques;Passive range of motion;Dry needling;Energy conservation;Splinting;Taping    PT Next Visit Plan  Focus on manual to reduce muscular restrictions and improve mobility. Ask lit/ab qs. Possible aquatic therapy working on LE strength, ankle mobility, and weightbearing    PT Home Exercise Plan  04/07/19: Ankle abcs, hip abduction, hip SLR, heel raise on step    Consulted and Agree with Plan of Care  Patient       Patient will benefit from skilled therapeutic intervention in order to improve the following deficits and impairments:  Abnormal gait, Increased fascial restricitons, Improper body mechanics, Pain, Decreased mobility, Decreased activity tolerance, Decreased endurance,  Decreased range of motion, Decreased strength, Hypomobility, Difficulty walking  Visit Diagnosis: Stiffness of left ankle, not elsewhere classified  Stiffness of right ankle, not elsewhere classified  Muscle weakness (generalized)  Other abnormalities of gait and mobility     Problem List Patient Active Problem List  Diagnosis Date Noted  . History of DVT (deep vein thrombosis) 04/26/2018  . Current use of long term anticoagulation 04/26/2018  . 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 Chronic pain of right knee 10/22/2017  . Right foot pain 10/22/2017  . Neck pain 10/22/2017  . Obesity (BMI 35.0-39.9 without comorbidity) 10/22/2017  . Healthcare maintenance 10/20/2017  . History of pulmonary embolus (PE) 10/20/2017  . GERD (gastroesophageal reflux disease) 09/08/2017  . Cough 08/11/2017  . Essential hypertension 08/11/2017  . Obstructive sleep apnea 08/11/2017   7:01 PM, 04/14/19 Josue Hector PT DPT  Physical Therapist with [facility]  [phone] [place] [address] [place], [place], [address] Phone: [phone]   Fax:  [phone]  Name: Isabel Garcia MRN: [id number] Date of Birth: 1956/01/10

## 2019-04-15 ENCOUNTER — Ambulatory Visit (HOSPITAL_COMMUNITY)
Admission: RE | Admit: 2019-04-15 | Discharge: 2019-04-15 | Disposition: A | Discharge status: Home / Self Care | Payer: 59 | Source: Ambulatory Visit | Attending: Family | Admitting: Family

## 2019-04-15 DIAGNOSIS — Z1231 Encounter for screening mammogram for malignant neoplasm of breast: Secondary | ICD-10-CM | POA: Insufficient documentation

## 2019-04-15 NOTE — Progress Notes (Signed)
Subjective:  Patient presents today to [facility] to see pharmacy team for Eliquis and inhaler follow-up.  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   Patient was able to obtain Eliquis from the pharmacy at a reasonable cost with co-pay card.  Patient started using maintenance inhaler Breo Ellipta daily and has noticed a significant improvement in her breathing.  Respiratory Medications Current: Airduo Digihaler and albuterol inhaler Tried in past: [place] Patient reports adherence challenges due to cost  Objective: No Known Allergies  Outpatient Encounter Medications as of 04/20/2019  Medication Sig Note  . albuterol (PROVENTIL HFA) 108 (90 Base) MCG/ACT inhaler Inhale 2 puffs into the lungs every 6 (six) hours as needed. 03/23/2019: Uses once a day.  Marland Kitchen amLODipine (NORVASC) 10 MG tablet Take 1 tablet (10 mg total) by mouth daily. for high blood pressure   . apixaban (ELIQUIS) 5 MG TABS tablet Take 1 tablet (5 mg total) by mouth 2 (two) times daily.   . diclofenac Sodium (VOLTAREN) 1 % GEL    . fluticasone (FLONASE) 50 MCG/ACT nasal spray Place 2 sprays into both nostrils daily.   . Fluticasone-Salmeterol,sensor, (AIRDUO DIGIHALER) 113-14 MCG/ACT AEPB Inhale 1 puff into the lungs 2 (two) times daily.   . meclizine (ANTIVERT) 25 MG tablet Take 1 tablet (25 mg total) by mouth 3 (three) times daily as needed for dizziness.   . montelukast (SINGULAIR) 10 MG tablet Take 1 tablet (10 mg total) by mouth at bedtime.   Marland Kitchen omeprazole (PRILOSEC) 40 MG capsule Take 1 capsule (40 mg total) by mouth daily.   Marland Kitchen PENNSAID 2 % SOLN Apply 2 (two) pumps topically to affected area(s) twice a day    No facility-administered encounter medications on file as of 04/20/2019.     Immunization History  Administered  Date(s) Administered  . Influenza Inj Mdck Quad Pf 11/08/2018  . Influenza Split 11/22/2016, 12/16/2016  . Influenza,inj,Quad PF,6+ Mos 10/20/2017  . Pneumococcal Polysaccharide-23 10/20/2017  . Tdap 09/06/2014  . Zoster Recombinat (Shingrix) 04/12/2019     PFTs PFT Results Latest Ref Rng & Units 09/08/2017  FVC-Pre L 2.56  FVC-Predicted Pre % 87  FVC-Post L 2.48  FVC-Predicted Post % 84  Pre FEV1/FVC % % 84  Post FEV1/FCV % % 89  FEV1-Pre L 2.16  FEV1-Predicted Pre % 93  FEV1-Post L 2.20  DLCO UNC% % 70  DLCO COR %Predicted % 92  TLC L 4.58  TLC % Predicted % 84  RV % Predicted % 94     Eosinophils Most recent blood eosinophil count was 0 cells/microL taken on 04/12/2019.   Assessment and Plan  1. Eliquis Medication Management  Patient is on Eliquis 5 mg twice daily for history of DVT/PE.  Dose appropriate based on age, weight, and kidney function.  Last CBC/CMP on 04/12/2019 within normal limits. Denies missing any doses. Denies any signs and symptoms of bleeding.  She has a history of noncompliance due to transfer of care and cost.  At last visit she was given a co-pay card which brought her cost down to $35 a month which is affordable.  Patient states she had an appointment with hematology last week to determine if she will require long-term anticoagulation therapy.  2. Inhaler Medication Management  Patient is currently using Breo Ellipta 1 puff once a day which is a sample that was provided by the office.  At last visit prescription sent in for air duo and given a co-pay card.  Patient has not filled as she still has a few days left of her Breo.  To the benefits investigation for air duo and it appears it is nonformulary.  Initiated a nonformulary PA for insurance approval.  We will update patient when we receive a response.   Patient was also given patient assistance application for [facility].  Patient states that she probably makes too much and would not qualify.   Encourage patient to apply or at least call and discuss eligibility.  Patient will call and discuss eligibility.  If air duo nonformulary PA is not approved and patient does not qualify for Brio Ellipta patient assistance we will switch to Advair diskus as it is the cheapest option through her insurance at $137 a month.  It is a DPI inhaler similar to [facility] and f feel confident patient can use without issue.  Patient verbalized understanding.  Patient was counseled on the purpose, proper use, and adverse effects of ICS/LABA inhaler.  Instructed patient to rinse mouth with water after using in order to prevent fungal infection.  Patient verbalized understanding.  Reviewed appropriate use of maintenance vs rescue inhalers.  Stressed importance of using maintenance inhaler daily and rescue inhaler only as needed.  Patient verbalized understanding.  Patient was given sample in office today.  Drug:Breo Ellipta 100-2 60mg Lot:[id number] Exp:10/2019  3. Immunizations  Patient is up-to-date on annual flu and pneumonia vaccines. She received first dose of Shingrix vaccine.  Recommend second dose of Shingrix and COVID-19 vaccines.   All questions encouraged and answered instructed patient to call with any further questions or concerns.  Thank you for allowing pharmacy to participate in this patient's care.  This appointment required 40 minutes of patient care (this includes precharting, chart review, review of results, face-to-face care, etc.).   AMariella Saa PharmD, BImperial CSequoyahClinical Specialty Pharmacist [phone] 04/20/2019 3:07 PM

## 2019-04-18 ENCOUNTER — Other Ambulatory Visit: Payer: Self-pay | Admitting: Family

## 2019-04-18 ENCOUNTER — Encounter (HOSPITAL_COMMUNITY): Payer: Self-pay

## 2019-04-18 ENCOUNTER — Other Ambulatory Visit: Payer: Self-pay

## 2019-04-18 DIAGNOSIS — E785 Hyperlipidemia, unspecified: Secondary | ICD-10-CM

## 2019-04-18 MED ORDER — ATORVASTATIN CALCIUM 20 MG PO TABS: 20.0000 mg | ORAL_TABLET | Freq: Every day | ORAL | 0 refills | Status: DC

## 2019-04-19 ENCOUNTER — Encounter (HOSPITAL_COMMUNITY): Payer: Self-pay | Admitting: Hematology

## 2019-04-19 ENCOUNTER — Inpatient Hospital Stay (HOSPITAL_COMMUNITY): Payer: 59 | Attending: Hematology | Admitting: Hematology

## 2019-04-19 ENCOUNTER — Inpatient Hospital Stay (HOSPITAL_COMMUNITY): Payer: 59

## 2019-04-19 VITALS — BP 142/88 | HR 66 | Temp 96.6°F | Resp 16 | Ht 66.0 in | Wt 213.0 lb

## 2019-04-19 DIAGNOSIS — K7581 Nonalcoholic steatohepatitis (NASH): Secondary | ICD-10-CM | POA: Diagnosis not present

## 2019-04-19 DIAGNOSIS — K7460 Unspecified cirrhosis of liver: Secondary | ICD-10-CM | POA: Insufficient documentation

## 2019-04-19 DIAGNOSIS — M1990 Unspecified osteoarthritis, unspecified site: Secondary | ICD-10-CM | POA: Diagnosis not present

## 2019-04-19 DIAGNOSIS — Z79899 Other long term (current) drug therapy: Secondary | ICD-10-CM | POA: Diagnosis not present

## 2019-04-19 DIAGNOSIS — I809 Phlebitis and thrombophlebitis of unspecified site: Secondary | ICD-10-CM | POA: Diagnosis not present

## 2019-04-19 DIAGNOSIS — Z86711 Personal history of pulmonary embolism: Secondary | ICD-10-CM | POA: Diagnosis present

## 2019-04-19 DIAGNOSIS — Z7901 Long term (current) use of anticoagulants: Secondary | ICD-10-CM | POA: Diagnosis not present

## 2019-04-19 DIAGNOSIS — I10 Essential (primary) hypertension: Secondary | ICD-10-CM | POA: Diagnosis not present

## 2019-04-19 DIAGNOSIS — K219 Gastro-esophageal reflux disease without esophagitis: Secondary | ICD-10-CM | POA: Diagnosis not present

## 2019-04-19 DIAGNOSIS — I2699 Other pulmonary embolism without acute cor pulmonale: Secondary | ICD-10-CM | POA: Insufficient documentation

## 2019-04-19 LAB — D-DIMER, QUANTITATIVE: D-Dimer, Quant: 0.29 ug/mL-FEU (ref 0.00–0.50)

## 2019-04-19 NOTE — Progress Notes (Signed)
CONSULT NOTE  Patient Care Team: Marrian Salvage, FNP as PCP - General (Internal Medicine)  CHIEF COMPLAINTS/PURPOSE OF CONSULTATION:  Unprovoked DVT and pulmonary embolism.  HISTORY OF PRESENTING ILLNESS:  Isabel Garcia 64 y.o. female is seen in consultation today for further work-up and management of unprovoked DVT and pulmonary embolism.  She was in her usual state of health in January 2019 when she had syncopal episode.  She was subsequently hospitalized and was diagnosed with right leg DVT and pulmonary embolism.  She denied any surgeries or prolonged bedrest prior to presentation.  No history of malignancies.  Denied any fevers, night sweats or weight loss.  Reports having hot flashes.  Reports chronic headaches and vision changes.  She reportedly had multiple vein ablations done on bilateral lower extremities at least 1 year prior to episode of DVT and PE.  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  She was never smoker.  Family history significant for maternal grandfather and paternal aunt with deep vein thrombosis.  No family history of malignancies.  No personal or family history of recurrent miscarriages.  Patient has right knee arthritis for which she needs replacement of the joint.  She also had plantar fasciitis of the left leg.  Hence she is not actively exercising at this time.  She reports appetite of 100% and energy levels of 50%.  Pain in the right knee is reported as 5 out of 10.  MEDICAL HISTORY:  Past Medical History:  Diagnosis Date  . Achilles tendinitis   . Arthritis   . Asthma   . Diverticulitis   . Diverticulosis   . GERD (gastroesophageal reflux disease)   . Heart murmur   . History of blood clots   . History of chicken pox   . History of pulmonary embolus (PE)   . Hypertension   . Liver cirrhosis secondary to NASH (nonalcoholic steatohepatitis) ([place])   . Osteoarthritis   . Phlebitis   . Plantar fasciitis   . Vertigo     SURGICAL  HISTORY: Past Surgical History:  Procedure Laterality Date  . ABDOMINAL HYSTERECTOMY    . bone spur removal    . KNEE ARTHROSCOPY Right   . KNEE ARTHROSCOPY Right     SOCIAL HISTORY: Social History   Socioeconomic History  . Marital status: Divorced    Spouse name: Not on file  . Number of children: 1  . Years of education: Not on file  . Highest education level: Not on file  Occupational History  . Occupation: Disabled  Tobacco Use  . Smoking status: Never Smoker  . Smokeless tobacco: Never Used  Substance and Sexual Activity  . Alcohol use: Not Currently  . Drug use: No  . Sexual activity: Not Currently  Other Topics Concern  . Not on file  Social History Narrative  . Not on file   Social Determinants of Health   Financial Resource Strain:   . Difficulty of Paying Living Expenses:   Food Insecurity:   . Worried About Charity fundraiser in the Last Year:   . Arboriculturist in the Last Year:   Transportation Needs:   . Film/video editor (Medical):   Marland Kitchen Lack of Transportation (Non-Medical):   Physical Activity:   . Days of Exercise per Week:   . Minutes of Exercise per Session:   Stress:   . Feeling of Stress :   Social Connections:   . Frequency of Communication with Friends and  Family:   . Frequency of Social Gatherings with Friends and Family:   . Attends Religious Services:   . Active Member of Clubs or Organizations:   . Attends Archivist Meetings:   Marland Kitchen Marital Status:   Intimate Partner Violence:   . Fear of Current or Ex-Partner:   . Emotionally Abused:   Marland Kitchen Physically Abused:   . Sexually Abused:     FAMILY HISTORY: Family History  Problem Relation Age of Onset  . Arthritis Mother   . Hypertension Mother   . Hypertension Father   . Heart attack Father   . Early death Father   . Asthma Sister   . Heart attack Brother   . Early death Brother   . Hypertension Brother   . Stroke Maternal Grandmother   . Heart attack Maternal  Grandfather   . Heart attack Paternal Grandfather   . Early death Paternal Grandfather     ALLERGIES:  has No Known Allergies.  MEDICATIONS:  Current Outpatient Medications  Medication Sig Dispense Refill  . albuterol (PROVENTIL HFA) 108 (90 Base) MCG/ACT inhaler Inhale 2 puffs into the lungs every 6 (six) hours as needed.    Marland Kitchen amLODipine (NORVASC) 10 MG tablet Take 1 tablet (10 mg total) by mouth daily. for high blood pressure 30 tablet 11  . apixaban (ELIQUIS) 5 MG TABS tablet Take 1 tablet (5 mg total) by mouth 2 (two) times daily. 60 tablet 1  . atorvastatin (LIPITOR) 20 MG tablet Take 1 tablet (20 mg total) by mouth daily. (Patient not taking: Reported on 04/18/2019) 90 tablet 0  . diclofenac Sodium (VOLTAREN) 1 % GEL     . fluticasone (FLONASE) 50 MCG/ACT nasal spray Place 2 sprays into both nostrils daily. 16 g 11  . Fluticasone-Salmeterol,sensor, (AIRDUO DIGIHALER) 113-14 MCG/ACT AEPB Inhale 1 puff into the lungs 2 (two) times daily. (Patient not taking: Reported on 04/18/2019) 1 each 2  . meclizine (ANTIVERT) 25 MG tablet Take 1 tablet (25 mg total) by mouth 3 (three) times daily as needed for dizziness. (Patient not taking: Reported on 04/18/2019) 30 tablet 0  . montelukast (SINGULAIR) 10 MG tablet Take 1 tablet (10 mg total) by mouth at bedtime. 30 tablet 2  . omeprazole (PRILOSEC) 40 MG capsule Take 1 capsule (40 mg total) by mouth daily. 30 capsule 3  . PENNSAID 2 % SOLN Apply 2 (two) pumps topically to affected area(s) twice a day     No current facility-administered medications for this visit.    REVIEW OF SYSTEMS:   Constitutional: Denies fevers, chills or abnormal night sweats Eyes: Denies blurriness of vision, double vision or watery eyes Ears, nose, mouth, throat, and face: Denies mucositis or sore throat Respiratory: Denies cough, dyspnea or wheezes Cardiovascular: Denies palpitation, chest discomfort or lower extremity swelling Gastrointestinal:  Denies nausea,  heartburn or change in bowel habits Skin: Denies abnormal skin rashes Lymphatics: Denies new lymphadenopathy or easy bruising Neurological:Denies numbness, tingling or new weaknesses Pain in the right knee present. Behavioral/Psych: Mood is stable, no new changes  All other systems were reviewed with the patient and are negative.  PHYSICAL EXAMINATION: ECOG PERFORMANCE STATUS: 0 - Asymptomatic  Vitals:   04/19/19 1300  BP: (!) 142/88  Pulse: 66  Resp: 16  Temp: (!) 96.6 F (35.9 C)  SpO2: 100%   Filed Weights   04/19/19 1300  Weight: 213 lb (96.6 kg)    GENERAL:alert, no distress and comfortable SKIN: skin color, texture, turgor are normal, no  rashes or significant lesions EYES: normal, conjunctiva are pink and non-injected, sclera clear OROPHARYNX:no exudate, no erythema and lips, buccal mucosa, and tongue normal  NECK: supple, thyroid normal size, non-tender, without nodularity LYMPH:  no palpable lymphadenopathy in the cervical, axillary or inguinal LUNGS: clear to auscultation and percussion with normal breathing effort HEART: regular rate & rhythm and no murmurs and no lower extremity edema ABDOMEN:abdomen soft, non-tender and normal bowel sounds Musculoskeletal:no cyanosis of digits and no clubbing Chronic venous stasis changes in the right leg. PSYCH: alert & oriented x 3 with fluent speech NEURO: no focal motor/sensory deficits  LABORATORY DATA:  I have reviewed the data as listed Recent Results (from the past 2160 hour(s))  Comp Met (CMET)     Status: None   Collection Time: 03/23/19 12:57 PM  Result Value Ref Range   Sodium 138 135 - 145 mEq/L   Potassium 3.7 3.5 - 5.1 mEq/L   Chloride 103 96 - 112 mEq/L   CO2 29 19 - 32 mEq/L   Glucose, Bld 83 70 - 99 mg/dL   BUN 15 6 - 23 mg/dL   Creatinine, Ser 0.76 0.40 - 1.20 mg/dL   Total Bilirubin 0.5 0.2 - 1.2 mg/dL   Alkaline Phosphatase 74 39 - 117 U/L   AST 22 0 - 37 U/L   ALT 25 0 - 35 U/L   Total Protein  7.7 6.0 - 8.3 g/dL   Albumin 4.2 3.5 - 5.2 g/dL   GFR 93.42 >60.00 mL/min   Calcium 9.8 8.4 - 10.5 mg/dL  CBC with Differential/Platelet     Status: None   Collection Time: 03/23/19 12:57 PM  Result Value Ref Range   WBC 5.0 4.0 - 10.5 K/uL   RBC 4.17 3.87 - 5.11 Mil/uL   Hemoglobin 12.8 12.0 - 15.0 g/dL   HCT 37.9 36.0 - 46.0 %   MCV 90.9 78.0 - 100.0 fl   MCHC 33.8 30.0 - 36.0 g/dL   RDW 14.0 11.5 - 15.5 %   Platelets 310.0 150.0 - 400.0 K/uL   Neutrophils Relative % 50.4 43.0 - 77.0 %   Lymphocytes Relative 40.2 12.0 - 46.0 %   Monocytes Relative 8.5 3.0 - 12.0 %   Eosinophils Relative 0.1 0.0 - 5.0 %   Basophils Relative 0.8 0.0 - 3.0 %   Neutro Abs 2.5 1.4 - 7.7 K/uL   Lymphs Abs 2.0 0.7 - 4.0 K/uL   Monocytes Absolute 0.4 0.1 - 1.0 K/uL   Eosinophils Absolute 0.0 0.0 - 0.7 K/uL   Basophils Absolute 0.0 0.0 - 0.1 K/uL  CBC w/Diff     Status: None   Collection Time: 04/12/19 11:20 AM  Result Value Ref Range   WBC 4.2 4.0 - 10.5 K/uL   RBC 4.24 3.87 - 5.11 Mil/uL   Hemoglobin 13.2 12.0 - 15.0 g/dL   HCT 38.4 36.0 - 46.0 %   MCV 90.5 78.0 - 100.0 fl   MCHC 34.4 30.0 - 36.0 g/dL   RDW 13.6 11.5 - 15.5 %   Platelets 296.0 150.0 - 400.0 K/uL   Neutrophils Relative % 44.8 43.0 - 77.0 %   Lymphocytes Relative 45.5 12.0 - 46.0 %   Monocytes Relative 9.1 3.0 - 12.0 %   Eosinophils Relative 0.0 0.0 - 5.0 %   Basophils Relative 0.6 0.0 - 3.0 %   Neutro Abs 1.9 1.4 - 7.7 K/uL   Lymphs Abs 1.9 0.7 - 4.0 K/uL   Monocytes Absolute 0.4 0.1 -  1.0 K/uL   Eosinophils Absolute 0.0 0.0 - 0.7 K/uL   Basophils Absolute 0.0 0.0 - 0.1 K/uL  Comp Met (CMET)     Status: None   Collection Time: 04/12/19 11:20 AM  Result Value Ref Range   Sodium 138 135 - 145 mEq/L   Potassium 4.0 3.5 - 5.1 mEq/L   Chloride 104 96 - 112 mEq/L   CO2 28 19 - 32 mEq/L   Glucose, Bld 94 70 - 99 mg/dL   BUN 10 6 - 23 mg/dL   Creatinine, Ser 0.80 0.40 - 1.20 mg/dL   Total Bilirubin 0.6 0.2 - 1.2 mg/dL    Alkaline Phosphatase 76 39 - 117 U/L   AST 22 0 - 37 U/L   ALT 25 0 - 35 U/L   Total Protein 7.8 6.0 - 8.3 g/dL   Albumin 4.2 3.5 - 5.2 g/dL   GFR 88.04 >60.00 mL/min   Calcium 9.8 8.4 - 10.5 mg/dL  Lipid panel     Status: Abnormal   Collection Time: 04/12/19 11:20 AM  Result Value Ref Range   Cholesterol 200 0 - 200 mg/dL    Comment: ATP III Classification       Desirable:  < 200 mg/dL               Borderline High:  200 - 239 mg/dL          High:  > = 240 mg/dL   Triglycerides 102.0 0.0 - 149.0 mg/dL    Comment: Normal:  <150 mg/dLBorderline High:  150 - 199 mg/dL   HDL 51.60 >39.00 mg/dL   VLDL 20.4 0.0 - 40.0 mg/dL   LDL Cholesterol 128 (H) 0 - 99 mg/dL   Total CHOL/HDL Ratio 4     Comment:                Men          Women1/2 Average Risk     3.4          3.3Average Risk          5.0          4.42X Average Risk          9.6          7.13X Average Risk          15.0          11.0                       NonHDL 148.83     Comment: NOTE:  Non-HDL goal should be 30 mg/dL higher than patient's LDL goal (i.e. LDL goal of < 70 mg/dL, would have non-HDL goal of < 100 mg/dL)  TSH     Status: None   Collection Time: 04/12/19 11:20 AM  Result Value Ref Range   TSH 0.85 0.35 - 4.50 uIU/mL  Vitamin D (25 hydroxy)     Status: None   Collection Time: 04/12/19 11:20 AM  Result Value Ref Range   VITD 33.95 30.00 - 100.00 ng/mL  HEP C AB W/REFL     Status: None   Collection Time: 04/12/19 11:20 AM  Result Value Ref Range   HEPATITIS C ANTIBODY REFILL$(REFL) NON-REACTIVE NON-REACTI   SIGNAL TO CUT-OFF 0.01 <1.00    Comment: . HCV antibody was non-reactive. There is no laboratory  evidence of HCV infection. . In most cases, no further action is required. However, if recent HCV exposure  is suspected, a test for HCV RNA (test code [phone]) is suggested. . For additional information please refer to http://education.[facility].com/faq/FAQ22v1 (This link is being provided for  informational/ educational purposes only.) .   HIV Antibody (routine testing w rflx)     Status: None   Collection Time: 04/12/19 11:20 AM  Result Value Ref Range   HIV 1&2 Ab, 4th Generation NON-REACTIVE NON-REACTI    Comment: HIV-1 antigen and HIV-1/HIV-2 antibodies were not detected. There is no laboratory evidence of HIV infection. Marland Kitchen PLEASE NOTE: This information has been disclosed to you from records whose confidentiality may be protected by state law.  If your state requires such protection, then the state law prohibits you from making any further disclosure of the information without the specific written consent of the person to whom it pertains, or as otherwise permitted by law. A general authorization for the release of medical or other information is NOT sufficient for this purpose. . For additional information please refer to http://education.[facility].com/faq/FAQ106 (This link is being provided for informational/ educational purposes only.) . Marland Kitchen The performance of this assay has not been clinically validated in patients less than 7 years old. Marland Kitchen   REFLEX TIQ     Status: None   Collection Time: 04/12/19 11:20 AM  Result Value Ref Range   REFLEX TIQ      Comment: OUR RECORDS INDICATE THAT YOU HAVE ORDERED HEP C AB W/REFL (RFL ORDER CODE 2960.  THIS IS A REFLEX-SPECIFIC ORDER CODE. HOWEVER, ONLY THE INITIAL TEST WAS PERFORMED, BECAUSE WE DO NOT HAVE A REFLEX TESTING AUTHORIZATION FORM ON FILE FOR YOU.  TO  PERFORM A REFLEX TEST WE NEED YOU TO SIGN AN AUTHORIZATION FORM  SPECIFYING (A) THE REFLEXIVE TEST AND (B) THE RESULTS THAT WILL  TRIGGER THE PERFORMANCE OF THE REFLEX TEST.  PLEASE CONTACT A  CLIENT SERVICE REPRESENTATIVE AT [facility] IF YOU WOULD  LIKE ADDITIONAL TESTING DONE OR CONTACT YOUR SALES REPRESENTATIVE  TO OBTAIN A COPY OF THE REFLEXIVE TESTING AUTHORIZATION FORM. . .     RADIOGRAPHIC STUDIES: I have personally reviewed the  radiological images as listed and agreed with the findings in the report.  ASSESSMENT & PLAN:  History of pulmonary embolus (PE) 1.  Unprovoked DVT and pulmonary embolism: -She was reportedly diagnosed with right leg DVT and pulmonary embolism in January 2019.  Presentation with syncopal episode resulting in admission to the hospital. -She has been on Eliquis since then.  No bleeding issues. -She reported multiple vein ablations done, last one 1 year prior to DVT. -2D echocardiogram in June 2019 shows normal ejection fraction (EF 65 to 70%). -She denies any fevers, night sweats or weight loss.  She does have hot flashes. -Last mammogram was on 04/15/2019.  Report is pending. -No clinical signs or symptoms of active malignancy.  No personal history of malignancy.  No family history of malignancy.  However maternal grandfather and paternal aunt had diabetes. -I have recommended checking a D-dimer and bilateral lower extremity Doppler to see if the DVT has completely resolved. -We will also check for anticardiolipin antibody, antibeta-2 glycoprotein antibody and lupus anticoagulant, factor V Leiden and prothrombin gene mutations. -I will see her back in 2 to 3 weeks for follow-up and discuss the duration of anticoagulation.     All questions were answered. The patient knows to call the clinic with any problems, questions or concerns.      Derek Jack, MD 04/19/19 1:44 PM

## 2019-04-19 NOTE — Patient Instructions (Addendum)
[place] at [facility] Discharge Instructions  You were seen today by Dr. Delton Coombes. He went over your history, family history and how you've been feeling lately. You will have blood drawn today prior to leaving the hospital. He will see you back in 3 weeks for follow up.   Thank you for choosing [place] at [facility] to provide your oncology and hematology care.  To afford each patient quality time with our provider, please arrive at least 15 minutes before your scheduled appointment time.   If you have a lab appointment with the [facility] please come in thru the  Main Entrance and check in at the main information desk  You need to re-schedule your appointment should you arrive 10 or more minutes late.  We strive to give you quality time with our providers, and arriving late affects you and other patients whose appointments are after yours.  Also, if you no show three or more times for appointments you may be dismissed from the clinic at the providers discretion.     Again, thank you for choosing [facility].  Our hope is that these requests will decrease the amount of time that you wait before being seen by our physicians.       _____________________________________________________________  Should you have questions after your visit to [facility], please contact our office at [phone] between the hours of 8:00 a.m. and 4:30 p.m.  Voicemails left after 4:00 p.m. will not be returned until the following business day.  For prescription refill requests, have your pharmacy contact our office and allow 72 hours.    Cancer Center Support Programs:   > Cancer Support Group  2nd Tuesday of the month 1pm-2pm, Journey Room

## 2019-04-19 NOTE — Assessment & Plan Note (Addendum)
1.  Unprovoked DVT and pulmonary embolism: -She was reportedly diagnosed with right leg DVT and pulmonary embolism in January 2019.  Presentation with syncopal episode resulting in admission to the hospital. -She has been on Eliquis since then.  No bleeding issues. -She reported multiple vein ablations done, last one 1 year prior to DVT. -2D echocardiogram in June 2019 shows normal ejection fraction (EF 65 to 70%). -She denies any fevers, night sweats or weight loss.  She does have hot flashes. -Last mammogram was on 04/15/2019.  Report is pending. -No clinical signs or symptoms of active malignancy.  No personal history of malignancy.  No family history of malignancy.  However maternal grandfather and paternal aunt had diabetes. -I have recommended checking a D-dimer and bilateral lower extremity Doppler to see if the DVT has completely resolved. -We will also check for anticardiolipin antibody, antibeta-2 glycoprotein antibody and lupus anticoagulant, factor V Leiden and prothrombin gene mutations. -I will see her back in 2 to 3 weeks for follow-up and discuss the duration of anticoagulation.

## 2019-04-20 ENCOUNTER — Other Ambulatory Visit: Payer: Self-pay

## 2019-04-20 ENCOUNTER — Telehealth: Payer: Self-pay | Admitting: Pharmacy Technician

## 2019-04-20 ENCOUNTER — Ambulatory Visit (INDEPENDENT_AMBULATORY_CARE_PROVIDER_SITE_OTHER): Payer: 59 | Admitting: Pharmacist

## 2019-04-20 DIAGNOSIS — J4540 Moderate persistent asthma, uncomplicated: Secondary | ICD-10-CM

## 2019-04-20 LAB — BETA-2-GLYCOPROTEIN I ABS, IGG/M/A
Beta-2 Glyco I IgG: <9 GPI IgG units (ref 0–20)
Beta-2-Glycoprotein I IgA: <9 GPI IgA units (ref 0–25)
Beta-2-Glycoprotein I IgM: <9 GPI IgM units (ref 0–32)

## 2019-04-20 LAB — CARDIOLIPIN ANTIBODIES, IGG, IGM, IGA
Anticardiolipin IgA: <9 APL U/mL (ref 0–11)
Anticardiolipin IgG: <9 GPL U/mL (ref 0–14)
Anticardiolipin IgM: 12 MPL U/mL (ref 0–12)

## 2019-04-20 MED ORDER — BREO ELLIPTA 100-25 MCG/INH IN AEPB: 1.0000 | INHALATION_SPRAY | Freq: Every day | RESPIRATORY_TRACT | 0 refills | Status: DC

## 2019-04-20 MED ORDER — AIRDUO DIGIHALER 113-14 MCG/ACT IN AEPB: 1.0000 | INHALATION_SPRAY | Freq: Two times a day (BID) | RESPIRATORY_TRACT | 2 refills | Status: DC

## 2019-04-20 NOTE — Patient Instructions (Signed)
   Continue Breo Sample until we hear back on whether Airduo was approved through insurance  If we are unable to get Airduo approved we will switch you to the cheapest ICS/LABA inhaler through your insurance which is Advair Diskus  Call [place] patient assistance to see if you would meet their eligibility requirements  Thank you for meeting with the pharmacy team! Please call with any questions [phone].

## 2019-04-20 NOTE — Telephone Encounter (Signed)
Findings of benefits investigation via test claims at [facility]:   Insurance: [place] - # 1 inhaler for a 1 month supply through patient's insurance is $ 200.00.  Advair Diskus - # 1 inhaler for a 1 month supply through patient's insurance is $ 136.70.  Advair HFA - # 1 inhaler for a 1 month supply through patient's insurance is $ 200.00.  Airduo Digihaler/Respiclick, Ruthe Mannan, and Symbicort are non-formulary.   **Patient has a $4950 pharmacy deductible for the year. Test claims do not reveal how much remains. No grants are open at this time.  11:16 AM Beatriz Chancellor, CPhT

## 2019-04-21 ENCOUNTER — Encounter (HOSPITAL_COMMUNITY): Payer: Self-pay | Admitting: Physical Therapy

## 2019-04-21 ENCOUNTER — Ambulatory Visit (HOSPITAL_COMMUNITY): Payer: 59 | Admitting: Physical Therapy

## 2019-04-21 DIAGNOSIS — M25672 Stiffness of left ankle, not elsewhere classified: Secondary | ICD-10-CM | POA: Diagnosis not present

## 2019-04-21 DIAGNOSIS — M25671 Stiffness of right ankle, not elsewhere classified: Secondary | ICD-10-CM

## 2019-04-21 DIAGNOSIS — R2689 Other abnormalities of gait and mobility: Secondary | ICD-10-CM

## 2019-04-21 DIAGNOSIS — M6281 Muscle weakness (generalized): Secondary | ICD-10-CM

## 2019-04-21 LAB — LUPUS ANTICOAGULANT PANEL
DRVVT: 64.3 s — ABNORMAL HIGH (ref 0.0–47.0)
PTT Lupus Anticoagulant: 40 s (ref 0.0–51.9)

## 2019-04-21 LAB — DRVVT MIX: dRVVT Mix: 47.5 s — ABNORMAL HIGH (ref 0.0–40.4)

## 2019-04-21 LAB — FACTOR 5 LEIDEN

## 2019-04-21 LAB — DRVVT CONFIRM: dRVVT Confirm: 1.1 ratio (ref 0.8–1.2)

## 2019-04-21 NOTE — Progress Notes (Signed)
Reviewed and agree.  Chesley Mires, MD [facility] Pulmonary/Critical Care 04/21/2019, 8:27 AM

## 2019-04-21 NOTE — Therapy (Signed)
[place], [place], [address] Phone: [phone]   Fax:  [phone]  Physical Therapy Treatment  Patient Details  Name: Isabel Garcia MRN: [id number] Date of Birth: 27-Jul-1955 Referring Provider (PT): Wylene Simmer, MD   Encounter Date: 04/21/2019  PT End of Session - 04/21/19 1530    Visit Number  4    Number of Visits  6    Date for PT Re-Evaluation  05/11/19    Authorization Type  [employer]    Authorization Time Period  03/30/19 - 05/11/19    Authorization - Visit Number  4    Authorization - Number of Visits  10    Progress Note Due on Visit  6    PT Start Time  1125    PT Stop Time  1220    PT Time Calculation (min)  55 min    Activity Tolerance  Patient tolerated treatment well    Behavior During Therapy  [facility] for tasks assessed/performed       Past Medical History:  Diagnosis Date  . Achilles tendinitis   . Arthritis   . Asthma   . Diverticulitis   . Diverticulosis   . GERD (gastroesophageal reflux disease)   . Heart murmur   . History of blood clots   . History of chicken pox   . History of pulmonary embolus (PE)   . Hypertension   . Liver cirrhosis secondary to NASH (nonalcoholic steatohepatitis) ([place])   . Osteoarthritis   . Phlebitis   . Plantar fasciitis   . Vertigo     Past Surgical History:  Procedure Laterality Date  . ABDOMINAL HYSTERECTOMY    . bone spur removal    . KNEE ARTHROSCOPY Right   . KNEE ARTHROSCOPY Right     There were no vitals filed for this visit.  Subjective Assessment - 04/21/19 1130    Subjective  Patient reported that the pain in her feet is not as bad today. She rated the pain as 4/10 currently.    Pertinent History  Removal of bone spur on the right side    Limitations  Standing;Walking;Sitting    How long can you sit comfortably?  30 minutes because feet start swelling    How long can you stand comfortably?  5 minutes    How long can you walk  comfortably?  5 minutes    Patient Stated Goals  To have less pain    Currently in Pain?  Yes    Pain Score  4     Pain Location  Foot    Pain Orientation  Right;Left    Pain Descriptors / Indicators  Aching    Pain Type  Chronic pain    Pain Onset  More than a month ago                       [facility] Adult PT Treatment/Exercise - 04/21/19 0001      Manual Therapy   Manual Therapy  Soft tissue mobilization;Myofascial release    Manual therapy comments  Completed separately than all other skilled interventions    Soft tissue mobilization  To bilateral gastroc/soleus complex and posterior tibialis muscle to decrease muscular restrictions    Myofascial Release  To bilateral plantar fascia to decrease restrictions and pain      Ankle Exercises: Seated   Other Seated Ankle Exercises  RTB exercises x10 each with verbal cues and demonstration DF, PF,  INV, and EV      Ankle Exercises: Stretches   Soleus Stretch  1 rep   Each LE 20'' demo for HEP   Gastroc Stretch  1 rep   Each LE 20'' demo for HEP            PT Education - 04/21/19 1527    Education Details  Educated on additional HEP discussed anatomy and possible causes of pain. Educated on performing self-massage at home as well as on proper performance of HEP.    Person(s) Educated  Patient    Methods  Handout;Explanation;Demonstration;Verbal cues    Comprehension  Verbalized understanding;Returned demonstration       PT Short Term Goals - 04/07/19 1135      PT SHORT TERM GOAL #1   Title  Patient will report understanding and regular compliance with HEP to improve mobility, strength, and overall functional mobility.    Time  3    Period  Weeks    Status  On-going    Target Date  04/20/19        PT Long Term Goals - 04/07/19 1136      PT LONG TERM GOAL #1   Title  Patient will demonstrated bilateral ankle AROM which is WFL to improve gait mechanics and improve overall functional mobility.    Time  6     Period  Weeks    Status  On-going      PT LONG TERM GOAL #2   Title  Patient will report that her symptoms have improved overall by at least 50% to improve her overall QOL.    Time  6    Period  Weeks    Status  On-going      PT LONG TERM GOAL #3   Title  Patient will report ability to walk for at least 15 minutes in order to perform grocery shopping and household chores with improved ease.    Time  6    Period  Weeks    Status  On-going            Plan - 04/21/19 1605    Clinical Impression Statement  Began session with manual therapy to decrease patient's pain and muscular restrictions. Noted increased tenderness and tightness to posterior tibialis muscle, but patient tolerated all manual well. Educated patient on anatomy of the lower leg and probable cause of pain. Educated on HEP including stretches for gastroc/soleus complex and for theraband for ankle to improve strength. Patient would benefit from continued skilled physical therapy to continue progressing towards functional goals.    Personal Factors and Comorbidities  Time since onset of injury/illness/exacerbation;Comorbidity 2    Comorbidities  Hx of DVT, HTN    Examination-Activity Limitations  Stand;Locomotion Level;Transfers;Squat;Stairs    Examination-Participation Restrictions  Yard Work;Cleaning;Meal Prep;Community Activity    Stability/Clinical Decision Making  Evolving/Moderate complexity    Rehab Potential  Good    PT Frequency  1x / week   Limited due to financial restrictions per patient request   PT Duration  6 weeks    PT Treatment/Interventions  ADLs/Self Care Home Management;Aquatic Therapy;Cryotherapy;Electrical Stimulation;Moist Heat;DME Instruction;Gait training;Stair training;Functional mobility training;Therapeutic activities;Therapeutic exercise;Balance training;Neuromuscular re-education;Patient/family education;Orthotic Fit/Training;Manual techniques;Passive range of motion;Dry needling;Energy  conservation;Splinting;Taping    PT Next Visit Plan  F/U on tolerance to manual. Ask lit/ab qs. Continue aquatic therapy working on LE strength, ankle mobility, and weightbearing    PT Home Exercise Plan  04/07/19: Ankle abcs, hip abduction, hip SLR, heel raise on  step; 04/21/19: Ankle Theraband exercises, gastroc/soleus stretches    Consulted and Agree with Plan of Care  Patient       Patient will benefit from skilled therapeutic intervention in order to improve the following deficits and impairments:  Abnormal gait, Increased fascial restricitons, Improper body mechanics, Pain, Decreased mobility, Decreased activity tolerance, Decreased endurance, Decreased range of motion, Decreased strength, Hypomobility, Difficulty walking  Visit Diagnosis: Stiffness of left ankle, not elsewhere classified  Stiffness of right ankle, not elsewhere classified  Muscle weakness (generalized)  Other abnormalities of gait and mobility     Problem List Patient Active Problem List   Diagnosis Date Noted  . History of DVT (deep vein thrombosis) 04/26/2018  . Current use of long term anticoagulation 04/26/2018  . 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 Chronic pain of right knee 10/22/2017  . Right foot pain 10/22/2017  . Neck pain 10/22/2017  . Obesity (BMI 35.0-39.9 without comorbidity) 10/22/2017  . Healthcare maintenance 10/20/2017  . History of pulmonary embolus (PE) 10/20/2017  . GERD (gastroesophageal reflux disease) 09/08/2017  . Cough 08/11/2017  . Essential hypertension 08/11/2017  . Obstructive sleep apnea 08/11/2017   Clarene Critchley PT, DPT 4:07 PM, 04/21/19 [place] [address] [place], [place], [address] Phone: [phone]   Fax:  [phone]  Name: Isabel Garcia MRN: [id number] Date of Birth: 11/27/1955

## 2019-04-23 ENCOUNTER — Ambulatory Visit: Payer: 59 | Attending: Internal Medicine

## 2019-04-23 DIAGNOSIS — Z23 Encounter for immunization: Secondary | ICD-10-CM

## 2019-04-23 NOTE — Progress Notes (Signed)
   Covid-19 Vaccination Clinic  Name:  [name]    MRN: [id number] DOB: 08/05/1955  04/23/2019  [name] was observed post Covid-19 immunization for 15 minutes without incident. She was provided with Vaccine Information Sheet and instruction to access the V-Safe system.   [name] was instructed to call 911 with any severe reactions post vaccine: Marland Kitchen Difficulty breathing  . Swelling of face and throat  . A fast heartbeat  . A bad rash all over body  . Dizziness and weakness   Immunizations Administered    Name Date Dose VIS Date Route   Moderna COVID-19 Vaccine 04/23/2019  9:24 AM 0.5 mL 01/04/2019 Intramuscular   Manufacturer: Moderna   Lot: [id number]   [place]: [phone]

## 2019-04-25 LAB — PROTHROMBIN GENE MUTATION

## 2019-04-25 NOTE — Telephone Encounter (Signed)
Received a fax regarding Prior Authorization from [facility] for [employer]. Authorization has been DENIED. Covermymeds does not say denial reason. Denial letter will be faxed to office.  8:27 AM Beatriz Chancellor, CPhT

## 2019-04-26 ENCOUNTER — Telehealth: Payer: Self-pay | Admitting: Pharmacy Technician

## 2019-04-26 NOTE — Telephone Encounter (Signed)
Left message for patient to obtain household size and annual income to see if patient would be eligible for an Asthma grant.  10:37 AM Isabel Garcia, CPhT

## 2019-04-27 ENCOUNTER — Ambulatory Visit (HOSPITAL_COMMUNITY)
Admission: RE | Admit: 2019-04-27 | Discharge: 2019-04-27 | Disposition: A | Discharge status: Home / Self Care | Payer: 59 | Source: Ambulatory Visit | Attending: Hematology | Admitting: Hematology

## 2019-04-27 ENCOUNTER — Other Ambulatory Visit: Payer: Self-pay

## 2019-04-27 DIAGNOSIS — I2699 Other pulmonary embolism without acute cor pulmonale: Secondary | ICD-10-CM | POA: Diagnosis present

## 2019-04-28 ENCOUNTER — Inpatient Hospital Stay
Admission: RE | Admit: 2019-04-28 | Discharge: 2019-04-28 | Disposition: A | Discharge status: Home / Self Care | Payer: Self-pay | Source: Ambulatory Visit | Attending: Family | Admitting: Family

## 2019-04-28 ENCOUNTER — Ambulatory Visit (HOSPITAL_COMMUNITY): Payer: 59

## 2019-04-28 ENCOUNTER — Other Ambulatory Visit: Payer: Self-pay | Admitting: Family

## 2019-04-28 ENCOUNTER — Encounter (HOSPITAL_COMMUNITY): Payer: Self-pay

## 2019-04-28 ENCOUNTER — Other Ambulatory Visit: Payer: Self-pay

## 2019-04-28 DIAGNOSIS — M25672 Stiffness of left ankle, not elsewhere classified: Secondary | ICD-10-CM | POA: Diagnosis not present

## 2019-04-28 DIAGNOSIS — Z1231 Encounter for screening mammogram for malignant neoplasm of breast: Secondary | ICD-10-CM

## 2019-04-28 DIAGNOSIS — M25671 Stiffness of right ankle, not elsewhere classified: Secondary | ICD-10-CM

## 2019-04-28 DIAGNOSIS — R2689 Other abnormalities of gait and mobility: Secondary | ICD-10-CM

## 2019-04-28 DIAGNOSIS — M6281 Muscle weakness (generalized): Secondary | ICD-10-CM

## 2019-04-28 NOTE — Therapy (Signed)
[place], [place], [address] Phone: [phone]   Fax:  [phone]  Physical Therapy Treatment  Patient Details  Name: Isabel Garcia MRN: [id number] Date of Birth: [birth year] Referring Provider (PT): Wylene Simmer, MD   Encounter Date: 04/28/2019  PT End of Session - 04/28/19 1121    Visit Number  5    Number of Visits  6    Date for PT Re-Evaluation  05/11/19    Authorization Type  [employer]    Authorization Time Period  03/30/19 - 05/11/19    Authorization - Visit Number  5    Authorization - Number of Visits  10    Progress Note Due on Visit  6    PT Start Time  1120    PT Stop Time  1202    PT Time Calculation (min)  42 min    Activity Tolerance  Patient tolerated treatment well;No increased pain    Behavior During Therapy  WFL for tasks assessed/performed       Past Medical History:  Diagnosis Date  . Achilles tendinitis   . Arthritis   . Asthma   . Diverticulitis   . Diverticulosis   . GERD (gastroesophageal reflux disease)   . Heart murmur   . History of blood clots   . History of chicken pox   . History of pulmonary embolus (PE)   . Hypertension   . Liver cirrhosis secondary to NASH (nonalcoholic steatohepatitis) ([place])   . Osteoarthritis   . Phlebitis   . Plantar fasciitis   . Vertigo     Past Surgical History:  Procedure Laterality Date  . ABDOMINAL HYSTERECTOMY    . bone spur removal    . KNEE ARTHROSCOPY Right   . KNEE ARTHROSCOPY Right     There were no vitals filed for this visit.  Subjective Assessment - 04/28/19 1124    Subjective  Pt reports L foot not hurting as bad today, R knee is very painful at 8/10 and she is now considering surgery for her R knee.    Pertinent History  Removal of bone spur on the right side    Limitations  Standing;Walking;Sitting    How long can you sit comfortably?  30 minutes because feet start swelling    How long can you stand comfortably?  5  minutes    How long can you walk comfortably?  5 minutes    Patient Stated Goals  To have less pain    Currently in Pain?  Yes    Pain Score  4     Pain Location  Foot    Pain Orientation  Left    Pain Descriptors / Indicators  Aching    Pain Type  Chronic pain    Pain Onset  More than a month ago    Pain Frequency  Constant    Aggravating Factors   standing and walking    Pain Relieving Factors  getting off of her feet    Effect of Pain on Daily Activities  severely limits             [facility] Adult PT Treatment/Exercise - 04/28/19 0001      Manual Therapy   Manual Therapy  Soft tissue mobilization;Myofascial release    Manual therapy comments  Completed separately than all other skilled interventions    Soft tissue mobilization  pt prone, bilateral gastroc/soleus complex and posterior tibialis muscle to decrease muscular restrictions  Myofascial Release  pt prone, bilateral plantar fascia to decrease restrictions and pain      Ankle Exercises: Seated   Towel Crunch  Other (comment)   bil feet, x20 reps   Other Seated Ankle Exercises  arch doming, cues for form, x10 minutes      Ankle Exercises: Standing   Heel Raises  Both    Heel Raises Limitations  10 reps toes forward, 10 reps toes in, 5 reps toes out    Other Standing Ankle Exercises  SLS, 1 minute each leg with intermittent HHA      Ankle Exercises: Stretches   Soleus Stretch  30 seconds   BLE, slant board   Gastroc Stretch  30 seconds   both, slant board            PT Education - 04/28/19 1125    Education Details  Exercise technique, reviewed HEP    Person(s) Educated  Patient    Methods  Explanation;Demonstration    Comprehension  Verbalized understanding;Returned demonstration       PT Short Term Goals - 04/07/19 1135      PT SHORT TERM GOAL #1   Title  Patient will report understanding and regular compliance with HEP to improve mobility, strength, and overall functional mobility.    Time  3     Period  Weeks    Status  On-going    Target Date  04/20/19        PT Long Term Goals - 04/07/19 1136      PT LONG TERM GOAL #1   Title  Patient will demonstrated bilateral ankle AROM which is WFL to improve gait mechanics and improve overall functional mobility.    Time  6    Period  Weeks    Status  On-going      PT LONG TERM GOAL #2   Title  Patient will report that her symptoms have improved overall by at least 50% to improve her overall QOL.    Time  6    Period  Weeks    Status  On-going      PT LONG TERM GOAL #3   Title  Patient will report ability to walk for at least 15 minutes in order to perform grocery shopping and household chores with improved ease.    Time  6    Period  Weeks    Status  On-going            Plan - 04/28/19 1124    Clinical Impression Statement  Continued manual for pain relief with good results. Pt subjectively reports less pain and tightness during manual STM and myofascial release to bil gastroc/soleus and plantar fascia. Pt with more tightness during soleus stretch compared to gastroc stretch on slant board; educated to decrease foot height on board to make stretch more tolerable. Added arch doming and towel crunches in sitting for intrinsic foot strengthening, both requiring verbal and tactile cues to reduce compensations. Pt with most difficulty performing calf raises with LE internal rotation, only able to complete 5 reps. Pt limited with standing exercises due to R knee pain this date. Pt reports no increase in pain at EOS. Continue to progress as able.    Personal Factors and Comorbidities  Time since onset of injury/illness/exacerbation;Comorbidity 2    Comorbidities  Hx of DVT, HTN    Examination-Activity Limitations  Stand;Locomotion Level;Transfers;Squat;Stairs    Examination-Participation Restrictions  Yard Work;Cleaning;Meal Prep;Community Activity    Stability/Clinical Decision Making  Evolving/Moderate complexity    Rehab  Potential  Good    PT Frequency  1x / week   Limited due to financial restrictions per patient request   PT Duration  6 weeks    PT Treatment/Interventions  ADLs/Self Care Home Management;Aquatic Therapy;Cryotherapy;Electrical Stimulation;Moist Heat;DME Instruction;Gait training;Stair training;Functional mobility training;Therapeutic activities;Therapeutic exercise;Balance training;Neuromuscular re-education;Patient/family education;Orthotic Fit/Training;Manual techniques;Passive range of motion;Dry needling;Energy conservation;Splinting;Taping    PT Next Visit Plan  Progress note next session due to visit #6. Ask lit/ab qs. Continue aquatic therapy working on LE strength, ankle mobility, and weightbearing    PT Home Exercise Plan  04/07/19: Ankle abcs, hip abduction, hip SLR, heel raise on step; 04/21/19: Ankle Theraband exercises, gastroc/soleus stretches    Consulted and Agree with Plan of Care  Patient       Patient will benefit from skilled therapeutic intervention in order to improve the following deficits and impairments:  Abnormal gait, Increased fascial restricitons, Improper body mechanics, Pain, Decreased mobility, Decreased activity tolerance, Decreased endurance, Decreased range of motion, Decreased strength, Hypomobility, Difficulty walking  Visit Diagnosis: Stiffness of left ankle, not elsewhere classified  Stiffness of right ankle, not elsewhere classified  Muscle weakness (generalized)  Other abnormalities of gait and mobility     Problem List Patient Active Problem List   Diagnosis Date Noted  . History of DVT (deep vein thrombosis) 04/26/2018  . Current use of long term anticoagulation 04/26/2018  . 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 Chronic pain of right knee 10/22/2017  . Right foot pain 10/22/2017  . Neck pain 10/22/2017  . Obesity (BMI 35.0-39.9 without comorbidity) 10/22/2017  . Healthcare maintenance 10/20/2017  . History of  pulmonary embolus (PE) 10/20/2017  . GERD (gastroesophageal reflux disease) 09/08/2017  . Cough 08/11/2017  . Essential hypertension 08/11/2017  . Obstructive sleep apnea 08/11/2017    Talbot Grumbling PT, DPT 04/28/19, 12:15 PM [place], [place], [address] Phone: [phone]   Fax:  [phone]  Name: Isabel Garcia MRN: [id number] Date of Birth: 28-Oct-1955

## 2019-04-29 NOTE — Telephone Encounter (Signed)
As Monday is currently open only for patients who previously enrolled.  Patient would not be eligible.  Closing encounter.   Mariella Saa, PharmD, [place], [place] Clinical Specialty Pharmacist [phone]  04/29/2019 8:53 AM

## 2019-04-29 NOTE — Telephone Encounter (Signed)
Called patient to notify of denial.  Patient states she received an email with the denial and called insurance.  She was given the list of preferred products which we already have listed above.  Advised that at this time Advair Diskus is the cheapest inhaler through her insurance at $137 a month.  Patient states that it is difficult for her to afford and inquired if there are any co-pay cards available.  Advised all ICS/LABA inhalers with co-pay cards available are nonformulary through her insurance.  We discussed patient assistance at last office appointment and she was given [place] patient assistance application to apply for Breo.  Recommend proceeding with patient assistance.  Patient is concerned that she will not qualify as her income is 60,000/year but she is in the process of applying for disability.  Her mother lives with her but does not contribute.  Advised that we still recommend applying for patient assistance and can potentially submit a letter of financial hardship to the program for approval.  Instructed patient to return completed application to the office along with income documents and documents of her application for disability.  We will also investigate if separate ICS and LABA inhalers would be more cost effective for this patient as well as if patient assistance is available with more generous income guidelines.  Patient verbalized understanding.  She will return documents to the office.   Mariella Saa, PharmD, [place], [place] Clinical Specialty Pharmacist [phone]  04/29/2019 8:53 AM

## 2019-05-02 ENCOUNTER — Other Ambulatory Visit (HOSPITAL_COMMUNITY): Payer: Self-pay | Admitting: *Deleted

## 2019-05-02 DIAGNOSIS — I2699 Other pulmonary embolism without acute cor pulmonale: Secondary | ICD-10-CM

## 2019-05-02 NOTE — Telephone Encounter (Signed)
Findings of benefits investigation via test claims at [facility]:  Insurance: Nitro - # 1 inhaler for a 1 month supply through patient's insurance is $ 72.86. Copay card pays up to $90/fill  Flovent - # 1 inhaler for a 1 month supply through patient's insurance is $ 200.00. no copay card available.  Pulmicort - # 1 inhaler for a 1 month supply through patient's insurance is $ 251.22. Copay card pays up to $50/fill  Qvar - # 1 inhaler for a 1 month supply through patient's insurance is $ 195.22. Copay card pays up to $98/fill  Serevent - # 1 inhaler for a 1 month supply through patient's insurance is $ 200.00. No copay card available.  Striverdi and Alvesco are non-formulary  *Claims do not show how much of deductible is remaining.  10:54 AM Beatriz Chancellor, CPhT

## 2019-05-04 ENCOUNTER — Telehealth: Payer: Self-pay | Admitting: Pulmonary Disease

## 2019-05-04 DIAGNOSIS — J4540 Moderate persistent asthma, uncomplicated: Secondary | ICD-10-CM

## 2019-05-04 NOTE — Telephone Encounter (Signed)
Received PA request for AirDuo. PA request rejected. We have coupons for AirDuo in office. Called and left message for patient to verify insurance as medicare patients can not use this coupon. Will leave encounter open for her call back. See initiated PA folder.

## 2019-05-04 NOTE — Telephone Encounter (Signed)
Will discuss with patient is she is denied patient assistance for [facility].   Mariella Saa, PharmD, [place], [place] Clinical Specialty Pharmacist [phone]  05/04/2019 4:09 PM

## 2019-05-05 ENCOUNTER — Encounter (HOSPITAL_COMMUNITY): Payer: Self-pay | Admitting: Physical Therapy

## 2019-05-05 ENCOUNTER — Ambulatory Visit (HOSPITAL_COMMUNITY): Payer: 59 | Attending: Orthopedic Surgery | Admitting: Physical Therapy

## 2019-05-05 DIAGNOSIS — M6281 Muscle weakness (generalized): Secondary | ICD-10-CM | POA: Insufficient documentation

## 2019-05-05 DIAGNOSIS — R2689 Other abnormalities of gait and mobility: Secondary | ICD-10-CM | POA: Insufficient documentation

## 2019-05-05 DIAGNOSIS — M25672 Stiffness of left ankle, not elsewhere classified: Secondary | ICD-10-CM | POA: Insufficient documentation

## 2019-05-05 DIAGNOSIS — M25671 Stiffness of right ankle, not elsewhere classified: Secondary | ICD-10-CM | POA: Diagnosis present

## 2019-05-05 NOTE — Therapy (Signed)
[place], [place], [address] Phone: [phone]   Fax:  [phone]  Physical Therapy Treatment/ Discharge Summary   Patient Details  Name: Isabel Garcia MRN: [id number] Date of Birth: 10-04-1955 Referring Provider (PT): Wylene Simmer, MD   Encounter Date: 05/05/2019   PHYSICAL THERAPY DISCHARGE SUMMARY  Visits from Start of Care: 6  Current functional level related to goals / functional outcomes: See below    Remaining deficits: See below    Education / Equipment: See assessment  Plan: Patient agrees to discharge.  Patient goals were partially met. Patient is being discharged due to the patient's request.  ?????       PT End of Session - 05/05/19 1802    Visit Number  6    Number of Visits  6    Date for PT Re-Evaluation  05/11/19    Authorization Type  [employer]    Authorization Time Period  03/30/19 - 05/11/19    Authorization - Visit Number  6    Authorization - Number of Visits  10    Progress Note Due on Visit  6    PT Start Time  1300    PT Stop Time  1350    PT Time Calculation (min)  50 min    Activity Tolerance  Patient tolerated treatment well;No increased pain    Behavior During Therapy  WFL for tasks assessed/performed       Past Medical History:  Diagnosis Date  . Achilles tendinitis   . Arthritis   . Asthma   . Diverticulitis   . Diverticulosis   . GERD (gastroesophageal reflux disease)   . Heart murmur   . History of blood clots   . History of chicken pox   . History of pulmonary embolus (PE)   . Hypertension   . Liver cirrhosis secondary to NASH (nonalcoholic steatohepatitis) ([place])   . Osteoarthritis   . Phlebitis   . Plantar fasciitis   . Vertigo     Past Surgical History:  Procedure Laterality Date  . ABDOMINAL HYSTERECTOMY    . bone spur removal    . KNEE ARTHROSCOPY Right   . KNEE ARTHROSCOPY Right     There were no vitals filed for this visit.  Subjective  Assessment - 05/05/19 1800    Subjective  Patient her knee is "ugly" today. Reports pain below knee cap and that it is locking up in the back. Pateitn says her LT foot is ok, but is having f.u with MD next week about surgery for her knee. Reports overall 30-35% improvement since starting therapy.    Pertinent History  Removal of bone spur on the right side    Limitations  Standing;Walking;Sitting    How long can you sit comfortably?  30 minutes because feet start swelling    How long can you stand comfortably?  5 minutes    How long can you walk comfortably?  5 minutes    Patient Stated Goals  To have less pain    Currently in Pain?  Yes    Pain Score  7     Pain Location  Knee    Pain Orientation  Right    Pain Descriptors / Indicators  Throbbing    Pain Type  Chronic pain    Pain Onset  More than a month ago    Pain Frequency  Constant  Adult Aquatic Therapy - 05/05/19 1804      Treatment   Gait  dynamic warm up, normal walk wiht cues for heel to toe transtion, high knee march, straight leg march, sidestepping 30' 2RT each    Exercises  calf stretch 3 x 30" each, heel raise 3-way x20 each, squats x20, hamstring stretch with noodle 2 x 30", quad stretch wiht noodle 2 x 30" each, leg press with noodle x20 each                 PT Education - 05/05/19 1802    Education Details  on transition to DC and HEP    Person(s) Educated  Patient    Methods  Explanation    Comprehension  Verbalized understanding       PT Short Term Goals - 05/05/19 1802      PT SHORT TERM GOAL #1   Title  Patient will report understanding and regular compliance with HEP to improve mobility, strength, and overall functional mobility.    Baseline  Reports compliance    Time  3    Period  Weeks    Status  Achieved    Target Date  04/20/19        PT Long Term Goals - 05/05/19 1803      PT LONG TERM GOAL #1   Title  Patient will demonstrated bilateral ankle AROM  which is WFL to improve gait mechanics and improve overall functional mobility.    Baseline  Current WFL excpet min restriction in ankle DF bilaterally    Time  6    Period  Weeks    Status  Partially Met      PT LONG TERM GOAL #2   Title  Patient will report that her symptoms have improved overall by at least 50% to improve her overall QOL.    Baseline  35%    Time  6    Period  Weeks    Status  Not Met      PT LONG TERM GOAL #3   Title  Patient will report ability to walk for at least 15 minutes in order to perform grocery shopping and household chores with improved ease.    Baseline  Can do but with increased knee pain    Time  6    Period  Weeks    Status  Partially Met            Plan - 05/05/19 1807    Clinical Impression Statement  Patient has made moderate progress toward therapy goals. Patient has improved in overall strength and mobility but continues to be limited with tolerance to activity due to ongoing RT knee pain. Patient reports she will be following up with ortho MD next week about possible surgery and would like to DC form therapy at this time to save visits for post op rehab. Patient educated on continued strength and mobility work with HEP, patient verbalized understanding and agreement. Patient instructed to follow up with therapy services with any further questions or concerns.    Personal Factors and Comorbidities  Time since onset of injury/illness/exacerbation;Comorbidity 2    Comorbidities  Hx of DVT, HTN    Examination-Activity Limitations  Stand;Locomotion Level;Transfers;Squat;Stairs    Examination-Participation Restrictions  Yard Work;Cleaning;Meal Prep;Community Activity    Stability/Clinical Decision Making  Evolving/Moderate complexity    Rehab Potential  Good    PT Frequency  1x / week   Limited due to financial restrictions per patient  request   PT Duration  6 weeks    PT Treatment/Interventions  ADLs/Self Care Home Management;Aquatic  Therapy;Cryotherapy;Electrical Stimulation;Moist Heat;DME Instruction;Gait training;Stair training;Functional mobility training;Therapeutic activities;Therapeutic exercise;Balance training;Neuromuscular re-education;Patient/family education;Orthotic Fit/Training;Manual techniques;Passive range of motion;Dry needling;Energy conservation;Splinting;Taping    PT Next Visit Plan  DC    PT Home Exercise Plan  04/07/19: Ankle abcs, hip abduction, hip SLR, heel raise on step; 04/21/19: Ankle Theraband exercises, gastroc/soleus stretches    Consulted and Agree with Plan of Care  Patient       Patient will benefit from skilled therapeutic intervention in order to improve the following deficits and impairments:  Abnormal gait, Increased fascial restricitons, Improper body mechanics, Pain, Decreased mobility, Decreased activity tolerance, Decreased endurance, Decreased range of motion, Decreased strength, Hypomobility, Difficulty walking  Visit Diagnosis: Stiffness of left ankle, not elsewhere classified  Stiffness of right ankle, not elsewhere classified  Muscle weakness (generalized)  Other abnormalities of gait and mobility     Problem List Patient Active Problem List   Diagnosis Date Noted  . History of DVT (deep vein thrombosis) 04/26/2018  . Current use of long term anticoagulation 04/26/2018  . 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 Chronic pain of right knee 10/22/2017  . Right foot pain 10/22/2017  . Neck pain 10/22/2017  . Obesity (BMI 35.0-39.9 without comorbidity) 10/22/2017  . Healthcare maintenance 10/20/2017  . History of pulmonary embolus (PE) 10/20/2017  . GERD (gastroesophageal reflux disease) 09/08/2017  . Cough 08/11/2017  . Essential hypertension 08/11/2017  . Obstructive sleep apnea 08/11/2017    6:12 PM, 05/05/19 Josue Hector PT DPT  Physical Therapist with [facility]  [phone] [place], [place], [address] Phone: [phone]   Fax:  [phone]  Name: Isabel Garcia MRN: [id number] Date of Birth: Jun 24, 1955

## 2019-05-09 NOTE — Telephone Encounter (Signed)
lmtcb X2 for pt.  Coupon has been placed up front for pickup.

## 2019-05-10 ENCOUNTER — Other Ambulatory Visit: Payer: Self-pay

## 2019-05-10 ENCOUNTER — Inpatient Hospital Stay (HOSPITAL_COMMUNITY): Payer: 59 | Attending: Hematology | Admitting: Hematology

## 2019-05-10 ENCOUNTER — Encounter (HOSPITAL_COMMUNITY): Payer: Self-pay | Admitting: Hematology

## 2019-05-10 VITALS — BP 139/71 | HR 58 | Temp 96.9°F | Resp 18 | Wt 222.0 lb

## 2019-05-10 DIAGNOSIS — I10 Essential (primary) hypertension: Secondary | ICD-10-CM | POA: Diagnosis not present

## 2019-05-10 DIAGNOSIS — Z90710 Acquired absence of both cervix and uterus: Secondary | ICD-10-CM | POA: Diagnosis not present

## 2019-05-10 DIAGNOSIS — Z7951 Long term (current) use of inhaled steroids: Secondary | ICD-10-CM | POA: Insufficient documentation

## 2019-05-10 DIAGNOSIS — J45909 Unspecified asthma, uncomplicated: Secondary | ICD-10-CM | POA: Insufficient documentation

## 2019-05-10 DIAGNOSIS — Z8261 Family history of arthritis: Secondary | ICD-10-CM | POA: Insufficient documentation

## 2019-05-10 DIAGNOSIS — Z79899 Other long term (current) drug therapy: Secondary | ICD-10-CM | POA: Diagnosis not present

## 2019-05-10 DIAGNOSIS — Z86711 Personal history of pulmonary embolism: Secondary | ICD-10-CM | POA: Insufficient documentation

## 2019-05-10 DIAGNOSIS — Z8249 Family history of ischemic heart disease and other diseases of the circulatory system: Secondary | ICD-10-CM | POA: Insufficient documentation

## 2019-05-10 DIAGNOSIS — Z791 Long term (current) use of non-steroidal anti-inflammatories (NSAID): Secondary | ICD-10-CM | POA: Insufficient documentation

## 2019-05-10 DIAGNOSIS — Z7901 Long term (current) use of anticoagulants: Secondary | ICD-10-CM | POA: Insufficient documentation

## 2019-05-10 DIAGNOSIS — Z86718 Personal history of other venous thrombosis and embolism: Secondary | ICD-10-CM | POA: Diagnosis present

## 2019-05-10 DIAGNOSIS — K7581 Nonalcoholic steatohepatitis (NASH): Secondary | ICD-10-CM | POA: Diagnosis not present

## 2019-05-10 NOTE — Patient Instructions (Addendum)
[place] at [facility] Discharge Instructions  You were seen today by Dr. Delton Coombes. He went over your recent lab results. Dr. Delton Coombes recommends that you continue Eliquis as prescribed.  He will see you back in one year for labs and follow up.   Thank you for choosing [facility] at [facility] to provide your oncology and hematology care.  To afford each patient quality time with our provider, please arrive at least 15 minutes before your scheduled appointment time.   If you have a lab appointment with the [facility] please come in thru the  Main Entrance and check in at the main information desk  You need to re-schedule your appointment should you arrive 10 or more minutes late.  We strive to give you quality time with our providers, and arriving late affects you and other patients whose appointments are after yours.  Also, if you no show three or more times for appointments you may be dismissed from the clinic at the providers discretion.     Again, thank you for choosing [facility].  Our hope is that these requests will decrease the amount of time that you wait before being seen by our physicians.       _____________________________________________________________  Should you have questions after your visit to [facility], please contact our office at [phone] between the hours of 8:00 a.m. and 4:30 p.m.  Voicemails left after 4:00 p.m. will not be returned until the following business day.  For prescription refill requests, have your pharmacy contact our office and allow 72 hours.    Cancer Center Support Programs:   > Cancer Support Group  2nd Tuesday of the month 1pm-2pm, Journey Room

## 2019-05-10 NOTE — Progress Notes (Deleted)
CONSULT NOTE  Patient Care Team: Marrian Salvage, FNP as PCP - General (Internal Medicine)  CHIEF COMPLAINTS/PURPOSE OF CONSULTATION:  Unprovoked DVT and pulmonary embolism.  HISTORY OF PRESENTING ILLNESS:  Isabel Garcia 64 y.o. female is seen in consultation today for further work-up and management of unprovoked DVT and pulmonary embolism.  She was in her usual state of health in January 2019 when she had syncopal episode.  She was subsequently hospitalized and was diagnosed with right leg DVT and pulmonary embolism.  She denied any surgeries or prolonged bedrest prior to presentation.  No history of malignancies.  Denied any fevers, night sweats or weight loss.  Reports having hot flashes.  Reports chronic headaches and vision changes.  She reportedly had multiple vein ablations done on bilateral lower extremities at least 1 year prior to episode of DVT and PE.  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  She was never smoker.  Family history significant for maternal grandfather and paternal aunt with deep vein thrombosis.  No family history of malignancies.  No personal or family history of recurrent miscarriages.  Patient has right knee arthritis for which she needs replacement of the joint.  She also had plantar fasciitis of the left leg.  Hence she is not actively exercising at this time.  She reports appetite of 100% and energy levels of 50%.  Pain in the right knee is reported as 5 out of 10.  MEDICAL HISTORY:  Past Medical History:  Diagnosis Date  . Achilles tendinitis   . Arthritis   . Asthma   . Diverticulitis   . Diverticulosis   . GERD (gastroesophageal reflux disease)   . Heart murmur   . History of blood clots   . History of chicken pox   . History of pulmonary embolus (PE)   . Hypertension   . Liver cirrhosis secondary to NASH (nonalcoholic steatohepatitis) ([place])   . Osteoarthritis   . Phlebitis   . Plantar fasciitis   . Vertigo     SURGICAL  HISTORY: Past Surgical History:  Procedure Laterality Date  . ABDOMINAL HYSTERECTOMY    . bone spur removal    . KNEE ARTHROSCOPY Right   . KNEE ARTHROSCOPY Right     SOCIAL HISTORY: Social History   Socioeconomic History  . Marital status: Divorced    Spouse name: Not on file  . Number of children: 1  . Years of education: Not on file  . Highest education level: Not on file  Occupational History  . Occupation: Disabled  Tobacco Use  . Smoking status: Never Smoker  . Smokeless tobacco: Never Used  Substance and Sexual Activity  . Alcohol use: Not Currently  . Drug use: No  . Sexual activity: Not Currently  Other Topics Concern  . Not on file  Social History Narrative  . Not on file   Social Determinants of Health   Financial Resource Strain:   . Difficulty of Paying Living Expenses:   Food Insecurity:   . Worried About Charity fundraiser in the Last Year:   . Arboriculturist in the Last Year:   Transportation Needs:   . Film/video editor (Medical):   Marland Kitchen Lack of Transportation (Non-Medical):   Physical Activity:   . Days of Exercise per Week:   . Minutes of Exercise per Session:   Stress:   . Feeling of Stress :   Social Connections:   . Frequency of Communication with Friends and  Family:   . Frequency of Social Gatherings with Friends and Family:   . Attends Religious Services:   . Active Member of Clubs or Organizations:   . Attends Archivist Meetings:   Marland Kitchen Marital Status:   Intimate Partner Violence:   . Fear of Current or Ex-Partner:   . Emotionally Abused:   Marland Kitchen Physically Abused:   . Sexually Abused:     FAMILY HISTORY: Family History  Problem Relation Age of Onset  . Arthritis Mother   . Hypertension Mother   . Hypertension Father   . Heart attack Father   . Early death Father   . Asthma Sister   . Heart attack Brother   . Early death Brother   . Hypertension Brother   . Stroke Maternal Grandmother   . Heart attack Maternal  Grandfather   . Heart attack Paternal Grandfather   . Early death Paternal Grandfather     ALLERGIES:  has No Known Allergies.  MEDICATIONS:  Current Outpatient Medications  Medication Sig Dispense Refill  . albuterol (PROVENTIL HFA) 108 (90 Base) MCG/ACT inhaler Inhale 2 puffs into the lungs every 6 (six) hours as needed.    Marland Kitchen amLODipine (NORVASC) 10 MG tablet Take 1 tablet (10 mg total) by mouth daily. for high blood pressure 30 tablet 11  . apixaban (ELIQUIS) 5 MG TABS tablet Take 1 tablet (5 mg total) by mouth 2 (two) times daily. 60 tablet 1  . atorvastatin (LIPITOR) 20 MG tablet Take 1 tablet (20 mg total) by mouth daily. 90 tablet 0  . cholecalciferol (VITAMIN D3) 25 MCG (1000 UNIT) tablet Take 1,000 Units by mouth daily.    . COD LIVER OIL PO Take 1 capsule by mouth daily.    . diclofenac Sodium (VOLTAREN) 1 % GEL 2 g 2 (two) times daily.     . fluticasone (FLONASE) 50 MCG/ACT nasal spray Place 2 sprays into both nostrils daily. 16 g 11  . fluticasone furoate-vilanterol (BREO ELLIPTA) 100-25 MCG/INH AEPB Inhale 1 puff into the lungs daily. 1 each 0  . Fluticasone-Salmeterol,sensor, (AIRDUO DIGIHALER) 113-14 MCG/ACT AEPB Inhale 1 puff into the lungs 2 (two) times daily. (Patient not taking: Reported on 04/20/2019) 1 each 2  . meclizine (ANTIVERT) 25 MG tablet Take 1 tablet (25 mg total) by mouth 3 (three) times daily as needed for dizziness. (Patient not taking: Reported on 04/18/2019) 30 tablet 0  . montelukast (SINGULAIR) 10 MG tablet Take 1 tablet (10 mg total) by mouth at bedtime. 30 tablet 2  . omeprazole (PRILOSEC) 40 MG capsule Take 1 capsule (40 mg total) by mouth daily. 30 capsule 3  . PENNSAID 2 % SOLN Apply 2 (two) pumps topically to affected area(s) twice a day    . Zinc Sulfate (ZINC 15 PO) Take 1 capsule by mouth daily.     No current facility-administered medications for this visit.    REVIEW OF SYSTEMS:   Constitutional: Denies fevers, chills or abnormal night  sweats Eyes: Denies blurriness of vision, double vision or watery eyes Ears, nose, mouth, throat, and face: Denies mucositis or sore throat Respiratory: Denies cough, dyspnea or wheezes Cardiovascular: Denies palpitation, chest discomfort or lower extremity swelling Gastrointestinal:  Denies nausea, heartburn or change in bowel habits Skin: Denies abnormal skin rashes Lymphatics: Denies new lymphadenopathy or easy bruising Neurological:Denies numbness, tingling or new weaknesses Pain in the right knee present. Behavioral/Psych: Mood is stable, no new changes  All other systems were reviewed with the patient and are  negative.  PHYSICAL EXAMINATION: ECOG PERFORMANCE STATUS: 0 - Asymptomatic  There were no vitals filed for this visit. There were no vitals filed for this visit.  GENERAL:alert, no distress and comfortable SKIN: skin color, texture, turgor are normal, no rashes or significant lesions EYES: normal, conjunctiva are pink and non-injected, sclera clear OROPHARYNX:no exudate, no erythema and lips, buccal mucosa, and tongue normal  NECK: supple, thyroid normal size, non-tender, without nodularity LYMPH:  no palpable lymphadenopathy in the cervical, axillary or inguinal LUNGS: clear to auscultation and percussion with normal breathing effort HEART: regular rate & rhythm and no murmurs and no lower extremity edema ABDOMEN:abdomen soft, non-tender and normal bowel sounds Musculoskeletal:no cyanosis of digits and no clubbing Chronic venous stasis changes in the right leg. PSYCH: alert & oriented x 3 with fluent speech NEURO: no focal motor/sensory deficits  LABORATORY DATA:  I have reviewed the data as listed Recent Results (from the past 2160 hour(s))  Comp Met (CMET)     Status: None   Collection Time: 03/23/19 12:57 PM  Result Value Ref Range   Sodium 138 135 - 145 mEq/L   Potassium 3.7 3.5 - 5.1 mEq/L   Chloride 103 96 - 112 mEq/L   CO2 29 19 - 32 mEq/L   Glucose, Bld  83 70 - 99 mg/dL   BUN 15 6 - 23 mg/dL   Creatinine, Ser 0.76 0.40 - 1.20 mg/dL   Total Bilirubin 0.5 0.2 - 1.2 mg/dL   Alkaline Phosphatase 74 39 - 117 U/L   AST 22 0 - 37 U/L   ALT 25 0 - 35 U/L   Total Protein 7.7 6.0 - 8.3 g/dL   Albumin 4.2 3.5 - 5.2 g/dL   GFR 93.42 >60.00 mL/min   Calcium 9.8 8.4 - 10.5 mg/dL  CBC with Differential/Platelet     Status: None   Collection Time: 03/23/19 12:57 PM  Result Value Ref Range   WBC 5.0 4.0 - 10.5 K/uL   RBC 4.17 3.87 - 5.11 Mil/uL   Hemoglobin 12.8 12.0 - 15.0 g/dL   HCT 37.9 36.0 - 46.0 %   MCV 90.9 78.0 - 100.0 fl   MCHC 33.8 30.0 - 36.0 g/dL   RDW 14.0 11.5 - 15.5 %   Platelets 310.0 150.0 - 400.0 K/uL   Neutrophils Relative % 50.4 43.0 - 77.0 %   Lymphocytes Relative 40.2 12.0 - 46.0 %   Monocytes Relative 8.5 3.0 - 12.0 %   Eosinophils Relative 0.1 0.0 - 5.0 %   Basophils Relative 0.8 0.0 - 3.0 %   Neutro Abs 2.5 1.4 - 7.7 K/uL   Lymphs Abs 2.0 0.7 - 4.0 K/uL   Monocytes Absolute 0.4 0.1 - 1.0 K/uL   Eosinophils Absolute 0.0 0.0 - 0.7 K/uL   Basophils Absolute 0.0 0.0 - 0.1 K/uL  CBC w/Diff     Status: None   Collection Time: 04/12/19 11:20 AM  Result Value Ref Range   WBC 4.2 4.0 - 10.5 K/uL   RBC 4.24 3.87 - 5.11 Mil/uL   Hemoglobin 13.2 12.0 - 15.0 g/dL   HCT 38.4 36.0 - 46.0 %   MCV 90.5 78.0 - 100.0 fl   MCHC 34.4 30.0 - 36.0 g/dL   RDW 13.6 11.5 - 15.5 %   Platelets 296.0 150.0 - 400.0 K/uL   Neutrophils Relative % 44.8 43.0 - 77.0 %   Lymphocytes Relative 45.5 12.0 - 46.0 %   Monocytes Relative 9.1 3.0 - 12.0 %  Eosinophils Relative 0.0 0.0 - 5.0 %   Basophils Relative 0.6 0.0 - 3.0 %   Neutro Abs 1.9 1.4 - 7.7 K/uL   Lymphs Abs 1.9 0.7 - 4.0 K/uL   Monocytes Absolute 0.4 0.1 - 1.0 K/uL   Eosinophils Absolute 0.0 0.0 - 0.7 K/uL   Basophils Absolute 0.0 0.0 - 0.1 K/uL  Comp Met (CMET)     Status: None   Collection Time: 04/12/19 11:20 AM  Result Value Ref Range   Sodium 138 135 - 145 mEq/L    Potassium 4.0 3.5 - 5.1 mEq/L   Chloride 104 96 - 112 mEq/L   CO2 28 19 - 32 mEq/L   Glucose, Bld 94 70 - 99 mg/dL   BUN 10 6 - 23 mg/dL   Creatinine, Ser 0.80 0.40 - 1.20 mg/dL   Total Bilirubin 0.6 0.2 - 1.2 mg/dL   Alkaline Phosphatase 76 39 - 117 U/L   AST 22 0 - 37 U/L   ALT 25 0 - 35 U/L   Total Protein 7.8 6.0 - 8.3 g/dL   Albumin 4.2 3.5 - 5.2 g/dL   GFR 88.04 >60.00 mL/min   Calcium 9.8 8.4 - 10.5 mg/dL  Lipid panel     Status: Abnormal   Collection Time: 04/12/19 11:20 AM  Result Value Ref Range   Cholesterol 200 0 - 200 mg/dL    Comment: ATP III Classification       Desirable:  < 200 mg/dL               Borderline High:  200 - 239 mg/dL          High:  > = 240 mg/dL   Triglycerides 102.0 0.0 - 149.0 mg/dL    Comment: Normal:  <150 mg/dLBorderline High:  150 - 199 mg/dL   HDL 51.60 >39.00 mg/dL   VLDL 20.4 0.0 - 40.0 mg/dL   LDL Cholesterol 128 (H) 0 - 99 mg/dL   Total CHOL/HDL Ratio 4     Comment:                Men          Women1/2 Average Risk     3.4          3.3Average Risk          5.0          4.42X Average Risk          9.6          7.13X Average Risk          15.0          11.0                       NonHDL 148.83     Comment: NOTE:  Non-HDL goal should be 30 mg/dL higher than patient's LDL goal (i.e. LDL goal of < 70 mg/dL, would have non-HDL goal of < 100 mg/dL)  TSH     Status: None   Collection Time: 04/12/19 11:20 AM  Result Value Ref Range   TSH 0.85 0.35 - 4.50 uIU/mL  Vitamin D (25 hydroxy)     Status: None   Collection Time: 04/12/19 11:20 AM  Result Value Ref Range   VITD 33.95 30.00 - 100.00 ng/mL  HEP C AB W/REFL     Status: None   Collection Time: 04/12/19 11:20 AM  Result Value Ref Range   HEPATITIS C ANTIBODY REFILL$(REFL)  NON-REACTIVE NON-REACTI   SIGNAL TO CUT-OFF 0.01 <1.00    Comment: . HCV antibody was non-reactive. There is no laboratory  evidence of HCV infection. . In most cases, no further action is required. However, if recent  HCV exposure is suspected, a test for HCV RNA (test code [phone]) is suggested. . For additional information please refer to http://education.[facility].com/faq/FAQ22v1 (This link is being provided for informational/ educational purposes only.) .   HIV Antibody (routine testing w rflx)     Status: None   Collection Time: 04/12/19 11:20 AM  Result Value Ref Range   HIV 1&2 Ab, 4th Generation NON-REACTIVE NON-REACTI    Comment: HIV-1 antigen and HIV-1/HIV-2 antibodies were not detected. There is no laboratory evidence of HIV infection. Marland Kitchen PLEASE NOTE: This information has been disclosed to you from records whose confidentiality may be protected by state law.  If your state requires such protection, then the state law prohibits you from making any further disclosure of the information without the specific written consent of the person to whom it pertains, or as otherwise permitted by law. A general authorization for the release of medical or other information is NOT sufficient for this purpose. . For additional information please refer to http://education.[facility].com/faq/FAQ106 (This link is being provided for informational/ educational purposes only.) . Marland Kitchen The performance of this assay has not been clinically validated in patients less than 77 years old. Marland Kitchen   REFLEX TIQ     Status: None   Collection Time: 04/12/19 11:20 AM  Result Value Ref Range   REFLEX TIQ      Comment: OUR RECORDS INDICATE THAT YOU HAVE ORDERED HEP C AB W/REFL (RFL ORDER CODE 2960.  THIS IS A REFLEX-SPECIFIC ORDER CODE. HOWEVER, ONLY THE INITIAL TEST WAS PERFORMED, BECAUSE WE DO NOT HAVE A REFLEX TESTING AUTHORIZATION FORM ON FILE FOR YOU.  TO  PERFORM A REFLEX TEST WE NEED YOU TO SIGN AN AUTHORIZATION FORM  SPECIFYING (A) THE REFLEXIVE TEST AND (B) THE RESULTS THAT WILL  TRIGGER THE PERFORMANCE OF THE REFLEX TEST.  PLEASE CONTACT A  CLIENT SERVICE REPRESENTATIVE AT [facility] IF YOU  WOULD  LIKE ADDITIONAL TESTING DONE OR CONTACT YOUR SALES REPRESENTATIVE  TO OBTAIN A COPY OF THE REFLEXIVE TESTING AUTHORIZATION FORM. . .   Lupus anticoagulant panel     Status: Abnormal   Collection Time: 04/19/19  1:55 PM  Result Value Ref Range   PTT Lupus Anticoagulant 40.0 0.0 - 51.9 sec   DRVVT 64.3 (H) 0.0 - 47.0 sec   Lupus Anticoag Interp Comment:     Comment: (NOTE) No lupus anticoagulant was detected. These results are consistent with specific inhibitors to one or more common pathway factors (X, V, II or fibrinogen). Performed At: [facility] [place], [place] [id number] Rush Farmer MD [id number]   D-dimer, quantitative     Status: None   Collection Time: 04/19/19  1:55 PM  Result Value Ref Range   D-Dimer, Quant 0.29 0.00 - 0.50 ug/mL-FEU    Comment: (NOTE) At the manufacturer cut-off of 0.50 ug/mL FEU, this assay has been documented to exclude PE with a sensitivity and negative predictive value of 97 to 99%.  At this time, this assay has not been approved by the FDA to exclude DVT/VTE. Results should be correlated with clinical presentation. Performed at [facility], [address], [place], [place] [address]   Cardiolipin antibodies, IgG, IgM, IgA     Status: None   Collection Time: 04/19/19  1:55 PM  Result Value Ref Range   Anticardiolipin IgG <9 0 - 14 GPL U/mL    Comment: (NOTE)                          Negative:              <15                          Indeterminate:     15 - 20                          Low-Med Positive: >20 - 80                          High Positive:         >80    Anticardiolipin IgM 12 0 - 12 MPL U/mL    Comment: (NOTE)                          Negative:              <13                          Indeterminate:     13 - 20                          Low-Med Positive: >20 - 80                          High Positive:         >80    Anticardiolipin IgA <9 0 - 11 APL U/mL    Comment: (NOTE)                           Negative:              <12                          Indeterminate:     12 - 20                          Low-Med Positive: >20 - 80                          High Positive:         >80 Performed At: [facility] [place], [place] [id number] Rush Farmer MD [id number]   Beta-2-glycoprotein i abs, IgG/M/A     Status: None   Collection Time: 04/19/19  1:55 PM  Result Value Ref Range   Beta-2 Glyco I IgG <9 0 - 20 GPI IgG units    Comment: (NOTE) The reference interval reflects a 3SD or 99th percentile interval, which is thought to represent a potentially clinically significant result in accordance with the International Consensus Statement on the classification criteria for definitive antiphospholipid syndrome (APS). J Thromb Haem 2006;4:295-306.    Beta-2-Glycoprotein I IgM <9 0 - 32 GPI IgM units    Comment: (NOTE) The reference interval reflects a 3SD or 99th percentile interval, which is  thought to represent a potentially clinically significant result in accordance with the International Consensus Statement on the classification criteria for definitive antiphospholipid syndrome (APS). J Thromb Haem 2006;4:295-306. Performed At: [facility] [place], [place] [id number] Rush Farmer MD [id number]:[id number]    Beta-2-Glycoprotein I IgA <9 0 - 25 GPI IgA units    Comment: (NOTE) The reference interval reflects a 3SD or 99th percentile interval, which is thought to represent a potentially clinically significant result in accordance with the International Consensus Statement on the classification criteria for definitive antiphospholipid syndrome (APS). J Thromb Haem 2006;4:295-306.   Factor 5 leiden     Status: None   Collection Time: 04/19/19  1:55 PM  Result Value Ref Range   Recommendations-F5LEID: Comment     Comment: (NOTE) Result:  Negative (no mutation found) Factor V Leiden is a specific mutation (R506Q) in the  factor V gene that is associated with an increased risk of venous thrombosis. Factor V Leiden is more resistant to inactivation by activated protein C.  As a result, factor V persists in the circulation leading to a mild hyper- coagulable state.  The Leiden mutation accounts for 90% - 95% of APC resistance.  Factor V Leiden has been reported in patients with deep vein thrombosis, pulmonary embolus, central retinal vein occlusion, cerebral sinus thrombosis and hepatic vein thrombosis. Other risk factors to be considered in the workup for venous thrombosis include the G20210A mutation in the factor II (prothrombin) gene, protein S and C deficiency, and antithrombin deficiencies. Anticardiolipin antibody and lupus anticoagulant analysis may be appropriate for certain patients, as well as homocysteine levels. Contact your local [facility] for information on how to order additi onal testing if desired. **Genetic counselors are available for health care providers to**  discuss results at 1-800-345-GENE [phone]). Methodology: DNA analysis of the Factor V gene was performed by allele-specific PCR. The diagnostic sensitivity and specificity is >99% for both. Molecular-based testing is highly accurate, but as in any laboratory test, diagnostic errors may occur. All test results must be combined with clinical information for the most accurate interpretation. This test was developed and its performance characteristics determined by [facility]. It has not been cleared or approved by the Food and Drug Administration. References: Voelkerding K (1996).  Clin Lab Med [phone]. Allison Quarry, PhD, [facility] Ruben Reason, PhD, [facility] Ileene Hutchinson, PhD, [facility] Alfredo Bach, PhD, [facility] Norva Riffle, PhD, [facility] Earlean Polka PhD, [facility] Performed At: [facility] [place], [place] [id number] Katina Degree MDPhD VO:[id number]   Prothrombin gene mutation     Status: None    Collection Time: 04/19/19  1:55 PM  Result Value Ref Range   Recommendations-PTGENE: Comment     Comment: (NOTE) NEGATIVE No mutation identified. Comment: A point mutation (G20210A) in the factor II (prothrombin) gene is the second most common cause of inherited thrombophilia. The incidence of this mutation in the [place] Caucasian population is about 2% and in the [place] American population it is approximately 0.5%. This mutation is rare in the [place] and Native American population. Being heterozygous for a prothrombin mutation increases the risk for developing venous thrombosis about 2 to 3 times above the general population risk. Being homozygous for the prothrombin gene mutation increases the relative risk for venous thrombosis further, although it is not yet known how much further the risk is increased. In women heterozygous for the prothrombin gene mutation, the use of estrogen containing oral contraceptives increases the relative risk of  venous thrombosis about 16 times and the risk of developing cerebral thrombosis is also significantly increased. In pregnancy the pr othrombin gene mutation increases risk for venous thrombosis and may increase risk for stillbirth, placental abruption, pre-eclampsia and fetal growth restriction. If the patient possesses two or more congenital or acquired thrombophilic risk factors, the risk for thrombosis may rise to more than the sum of the risk ratios for the individual mutations. This assay detects only the prothrombin G20210A mutation and does not measure genetic abnormalities elsewhere in the genome. Other thrombotic risk factors may be pursued through systematic clinical laboratory analysis. These factors include the R506Q (Leiden) mutation in the Factor V gene, plasma homocysteine levels, as well as testing for deficiencies of antithrombin III, protein C and protein S. Genetic Counselors are available for health care providers to discuss  results at 1-800-345-GENE [phone]). Methodology: DNA analysis of the Factor II gene was performed by PCR amplification followed by restriction analysis. The di agnostic sensitivity is >99% for both. All the tests must be combined with clinical information for the most accurate interpretation. Molecular-based testing is highly accurate, but as in any laboratory test, diagnostic errors may occur. This test was developed and its performance characteristics determined by [facility]. It has not been cleared or approved by the Food and Drug Administration. Poort SR, et al. Blood. 1996; 28:7867-6720. Varga EA. Circulation. 2004; 947:S96-G83. Mervin Hack, et [place]; 19:700-703. Allison Quarry, PhD, [facility] Ruben Reason, PhD, [facility] Ileene Hutchinson, PhD, [facility] Alfredo Bach, PhD, [facility] Norva Riffle, PhD, [facility] Earlean Polka, PhD, [facility] Performed At: [facility] [address] [place], [place] [id number] Katina Degree MDPhD [id number]   dRVVT Mix     Status: Abnormal   Collection Time: 04/19/19  1:55 PM  Result Value Ref Range   dRVVT Mix 47.5 (H) 0.0 - 40.4 sec    Comment: (NOTE) Performed At: [facility] [place], [place] [id number] Rush Farmer MD [id number]   dRVVT Confirm     Status: None   Collection Time: 04/19/19  1:55 PM  Result Value Ref Range   dRVVT Confirm 1.1 0.8 - 1.2 ratio    Comment: (NOTE) Performed At: [facility] [place], [place] [id number] Rush Farmer MD SV:[id number]     RADIOGRAPHIC STUDIES: I have personally reviewed the radiological images as listed and agreed with the findings in the report.  ASSESSMENT & PLAN:  No problem-specific Assessment & Plan notes found for this encounter.     All questions were answered. The patient knows to call the clinic with any problems, questions or concerns.      Tally Due, LPN 23/30/07 6:22 PM

## 2019-05-10 NOTE — Assessment & Plan Note (Addendum)
1.  Unprovoked DVT and PE: -She had right leg DVT and PE in January 2019 in [place], when she had syncopal episode on the street, resulting in admission to the hospital. -She reported multiple vein ablations done, last ablation about 1 year prior to DVT. -2D echo in June 2019 shows normal EF 65-70%. -Mammogram on 04/15/2019 is BI-RADS Category 1. -Does not have any B symptoms.  No personal history of malignancy.  No family history of malignancy. -We reviewed D-dimer results which are normal.  Bilateral lower extremity Doppler were also negative for DVT.  Lupus anticoagulant, anticardiolipin antibody, antibeta-2 glycoprotein 1 antibody, factor V Leiden and prothrombin gene mutations were negative. -As she had a life-threatening unprovoked symptomatic pulmonary embolism, and a low risk of bleeding, I suggested indefinite anticoagulation.  She is tolerating Eliquis very well without any bleeding issues. -I will continue to monitor risk-benefit ratio once a year.  We will also check D-dimer in a year.

## 2019-05-10 NOTE — Progress Notes (Signed)
Isabel Garcia, Isabel Garcia [id number]   CLINIC:  Medical Oncology/Hematology  PCP:  Isabel Garcia, [place] Isabel Garcia [address] [phone]   REASON FOR VISIT:  Follow-up for unprovoked DVT and PE.  CURRENT THERAPY: Eliquis.   INTERVAL HISTORY:  Isabel Garcia 64 y.o. female seen for follow-up of DVT and pulmonary embolism.  Appetite is 100%.  Energy levels are 50%.  Pain in the right knee is rated as 6 out of 10 and chronic.  Shortness of breath on exertion is also stable.  Chronic leg swellings are also stable.  No bleeding issues reported with Eliquis.    REVIEW OF SYSTEMS:  Review of Systems  Respiratory: Positive for shortness of breath.   Cardiovascular: Positive for leg swelling.  All other systems reviewed and are negative.    PAST MEDICAL/SURGICAL HISTORY:  Past Medical History:  Diagnosis Date  . Achilles tendinitis   . Arthritis   . Asthma   . Diverticulitis   . Diverticulosis   . GERD (gastroesophageal reflux disease)   . Heart murmur   . History of blood clots   . History of chicken pox   . History of pulmonary embolus (PE)   . Hypertension   . Liver cirrhosis secondary to NASH (nonalcoholic steatohepatitis) ([place])   . Osteoarthritis   . Phlebitis   . Plantar fasciitis   . Vertigo    Past Surgical History:  Procedure Laterality Date  . ABDOMINAL HYSTERECTOMY    . bone spur removal    . KNEE ARTHROSCOPY Right   . KNEE ARTHROSCOPY Right      SOCIAL HISTORY:  Social History   Socioeconomic History  . Marital status: Divorced    Spouse name: Not on file  . Number of children: 1  . Years of education: Not on file  . Highest education level: Not on file  Occupational History  . Occupation: Disabled  Tobacco Use  . Smoking status: Never Smoker  . Smokeless tobacco: Never Used  Substance and Sexual Activity  . Alcohol use: Not Currently  . Drug use: No  . Sexual activity: Not Currently    Other Topics Concern  . Not on file  Social History Narrative  . Not on file   Social Determinants of Health   Financial Resource Strain:   . Difficulty of Paying Living Expenses:   Food Insecurity:   . Worried About Charity fundraiser in the Last Year:   . Arboriculturist in the Last Year:   Transportation Needs:   . Film/video editor (Medical):   Marland Kitchen Lack of Transportation (Non-Medical):   Physical Activity:   . Days of Exercise per Week:   . Minutes of Exercise per Session:   Stress:   . Feeling of Stress :   Social Connections:   . Frequency of Communication with Friends and Family:   . Frequency of Social Gatherings with Friends and Family:   . Attends Religious Services:   . Active Member of Clubs or Organizations:   . Attends Archivist Meetings:   Marland Kitchen Marital Status:   Intimate Partner Violence:   . Fear of Current or Ex-Partner:   . Emotionally Abused:   Marland Kitchen Physically Abused:   . Sexually Abused:     FAMILY HISTORY:  Family History  Problem Relation Age of Onset  . Arthritis Mother   . Hypertension Mother   . Hypertension Father   .  Heart attack Father   . Early death Father   . Asthma Sister   . Heart attack Brother   . Early death Brother   . Hypertension Brother   . Stroke Maternal Grandmother   . Heart attack Maternal Grandfather   . Heart attack Paternal Grandfather   . Early death Paternal Grandfather     CURRENT MEDICATIONS:  Outpatient Encounter Medications as of 05/10/2019  Medication Sig Note  . amLODipine (NORVASC) 10 MG tablet Take 1 tablet (10 mg total) by mouth daily. for high blood pressure   . apixaban (ELIQUIS) 5 MG TABS tablet Take 1 tablet (5 mg total) by mouth 2 (two) times daily.   Marland Kitchen atorvastatin (LIPITOR) 20 MG tablet Take 1 tablet (20 mg total) by mouth daily.   . cholecalciferol (VITAMIN D3) 25 MCG (1000 UNIT) tablet Take 1,000 Units by mouth daily.   . COD LIVER OIL PO Take 1 capsule by mouth daily.   .  diclofenac Sodium (VOLTAREN) 1 % GEL 2 g 2 (two) times daily.    . fluticasone (FLONASE) 50 MCG/ACT nasal spray Place 2 sprays into both nostrils daily.   . fluticasone furoate-vilanterol (BREO ELLIPTA) 100-25 MCG/INH AEPB Inhale 1 puff into the lungs daily.   . montelukast (SINGULAIR) 10 MG tablet Take 1 tablet (10 mg total) by mouth at bedtime.   Marland Kitchen omeprazole (PRILOSEC) 40 MG capsule Take 1 capsule (40 mg total) by mouth daily.   Marland Kitchen PENNSAID 2 % SOLN Apply 2 (two) pumps topically to affected area(s) twice a day   . Zinc Sulfate (ZINC 15 PO) Take 1 capsule by mouth daily.   Marland Kitchen albuterol (PROVENTIL HFA) 108 (90 Base) MCG/ACT inhaler Inhale 2 puffs into the lungs every 6 (six) hours as needed. 04/20/2019: Used 2 times in the past 2 weeks.  . Fluticasone-Salmeterol,sensor, (AIRDUO DIGIHALER) 113-14 MCG/ACT AEPB Inhale 1 puff into the lungs 2 (two) times daily. (Patient not taking: Reported on 04/20/2019)   . meclizine (ANTIVERT) 25 MG tablet Take 1 tablet (25 mg total) by mouth 3 (three) times daily as needed for dizziness. (Patient not taking: Reported on 04/18/2019)    No facility-administered encounter medications on file as of 05/10/2019.    ALLERGIES:  No Known Allergies   PHYSICAL EXAM:  ECOG Performance status: 1  Vitals:   05/10/19 1450  BP: 139/71  Pulse: (!) 58  Resp: 18  Temp: (!) 96.9 F (36.1 C)  SpO2: 99%   Filed Weights   05/10/19 1450  Weight: 222 lb (100.7 kg)    Physical Exam Vitals reviewed.  Constitutional:      Appearance: Normal appearance.  Cardiovascular:     Rate and Rhythm: Normal rate and regular rhythm.     Heart sounds: Normal heart sounds.  Pulmonary:     Effort: Pulmonary effort is normal.     Breath sounds: Normal breath sounds.  Neurological:     General: No focal deficit present.     Mental Status: She is alert and oriented to person, place, and time.  Psychiatric:        Mood and Affect: Mood normal.        Behavior: Behavior normal.       LABORATORY DATA:  I have reviewed the labs as listed.  CBC    Component Value Date/Time   WBC 4.2 04/12/2019 1120   RBC 4.24 04/12/2019 1120   HGB 13.2 04/12/2019 1120   HCT 38.4 04/12/2019 1120   PLT 296.0 04/12/2019 1120  MCV 90.5 04/12/2019 1120   MCH 30.4 07/02/2018 1621   MCHC 34.4 04/12/2019 1120   RDW 13.6 04/12/2019 1120   LYMPHSABS 1.9 04/12/2019 1120   MONOABS 0.4 04/12/2019 1120   EOSABS 0.0 04/12/2019 1120   BASOSABS 0.0 04/12/2019 1120   CMP Latest Ref Rng & Units 04/12/2019 03/23/2019 07/02/2018  Glucose 70 - 99 mg/dL 94 83 91  BUN 6 - 23 mg/dL 10 15 11   Creatinine 0.40 - 1.20 mg/dL 0.80 0.76 0.81  Sodium 135 - 145 mEq/L 138 138 140  Potassium 3.5 - 5.1 mEq/L 4.0 3.7 3.2(L)  Chloride 96 - 112 mEq/L 104 103 105  CO2 19 - 32 mEq/L 28 29 25   Calcium 8.4 - 10.5 mg/dL 9.8 9.8 9.4  Total Protein 6.0 - 8.3 g/dL 7.8 7.7 -  Total Bilirubin 0.2 - 1.2 mg/dL 0.6 0.5 -  Alkaline Phos 39 - 117 U/L 76 74 -  AST 0 - 37 U/L 22 22 -  ALT 0 - 35 U/L 25 25 -       DIAGNOSTIC IMAGING:  I have independently reviewed the scans and discussed with the patient.    ASSESSMENT & PLAN:   History of pulmonary embolus (PE) 1.  Unprovoked DVT and PE: -She had right leg DVT and PE in January 2019 in [place], when she had syncopal episode on the street, resulting in admission to the hospital. -She reported multiple vein ablations done, last ablation about 1 year prior to DVT. -2D echo in June 2019 shows normal EF 65-70%. -Mammogram on 04/15/2019 is BI-RADS Category 1. -Does not have any B symptoms.  No personal history of malignancy.  No family history of malignancy. -We reviewed D-dimer results which are normal.  Bilateral lower extremity Doppler were also negative for DVT.  Lupus anticoagulant, anticardiolipin antibody, antibeta-2 glycoprotein 1 antibody, factor V Leiden and prothrombin gene mutations were negative. -As she had a life-threatening unprovoked symptomatic  pulmonary embolism, and a low risk of bleeding, I suggested indefinite anticoagulation. -I will continue to monitor risk-benefit ratio once a year.  We will also check D-dimer in a year.      Orders placed this encounter:  Orders Placed This Encounter  Procedures  . D-dimer, quantitative      Derek Jack, MD [place] [phone]

## 2019-05-11 MED ORDER — AIRDUO DIGIHALER 113-14 MCG/ACT IN AEPB: 1.0000 | INHALATION_SPRAY | Freq: Two times a day (BID) | RESPIRATORY_TRACT | 2 refills | Status: DC

## 2019-05-11 NOTE — Telephone Encounter (Signed)
Rx has been sent in. Nothing further needed.

## 2019-05-11 NOTE — Telephone Encounter (Signed)
Pt just came in to pick up her coupon for AirDuo and she would also like for you to send a prescription for this medication to the pharmacy. If any questions you can call her. Thank you

## 2019-05-12 ENCOUNTER — Encounter (HOSPITAL_COMMUNITY): Payer: 59 | Admitting: Physical Therapy

## 2019-05-12 ENCOUNTER — Ambulatory Visit (HOSPITAL_COMMUNITY): Payer: 59 | Admitting: Physical Therapy

## 2019-05-16 ENCOUNTER — Other Ambulatory Visit: Payer: Self-pay

## 2019-05-16 ENCOUNTER — Other Ambulatory Visit (INDEPENDENT_AMBULATORY_CARE_PROVIDER_SITE_OTHER): Payer: 59

## 2019-05-16 DIAGNOSIS — E785 Hyperlipidemia, unspecified: Secondary | ICD-10-CM

## 2019-05-16 LAB — COMPREHENSIVE METABOLIC PANEL
ALT: 33 U/L (ref 0–35)
AST: 26 U/L (ref 0–37)
Albumin: 4.1 g/dL (ref 3.5–5.2)
Alkaline Phosphatase: 72 U/L (ref 39–117)
BUN: 13 mg/dL (ref 6–23)
CO2: 28 mEq/L (ref 19–32)
Calcium: 9.3 mg/dL (ref 8.4–10.5)
Chloride: 104 mEq/L (ref 96–112)
Creatinine, Ser: 0.68 mg/dL (ref 0.40–1.20)
GFR: 106.16 mL/min (ref >60.00)
Glucose, Bld: 109 mg/dL — ABNORMAL HIGH (ref 70–99)
Potassium: 3.4 mEq/L — ABNORMAL LOW (ref 3.5–5.1)
Sodium: 139 mEq/L (ref 135–145)
Total Bilirubin: 0.4 mg/dL (ref 0.2–1.2)
Total Protein: 7.3 g/dL (ref 6.0–8.3)

## 2019-05-16 LAB — LIPID PANEL
Cholesterol: 147 mg/dL (ref 0–200)
HDL: 53 mg/dL (ref >39.00)
LDL Cholesterol: 84 mg/dL (ref 0–99)
NonHDL: 93.78
Total CHOL/HDL Ratio: 3
Triglycerides: 50 mg/dL (ref 0.0–149.0)
VLDL: 10 mg/dL (ref 0.0–40.0)

## 2019-05-20 ENCOUNTER — Telehealth: Payer: Self-pay

## 2019-05-20 NOTE — Telephone Encounter (Signed)
PA request received from [facility] in [place]  Drug requested: AirDuo 113-18mg/ACT CMM Key: BFlavia Shipper(last name on CMM: Henri mcfadden) Tried/failed: Breo 100 Covered alternatives: none listed PA request has been sent to plan, and a determination is expected within 3 days.   Routing to triage for follow-up ([facility] does not have an assigned nurse/CMA)

## 2019-05-24 NOTE — Telephone Encounter (Signed)
Checked status of PA. PA is still pending on [email or web address].

## 2019-05-25 ENCOUNTER — Ambulatory Visit: Payer: 59 | Attending: Internal Medicine

## 2019-05-25 DIAGNOSIS — Z23 Encounter for immunization: Secondary | ICD-10-CM

## 2019-05-25 NOTE — Progress Notes (Signed)
2  Covid-19 Vaccination Clinic  Name:  Isabel Garcia    MRN: [id number] DOB: 10/26/1955  05/25/2019  [name] was observed post Covid-19 immunization for 15 minutes without incident. She was provided with Vaccine Information Sheet and instruction to access the V-Safe system.   [name] was instructed to call 911 with any severe reactions post vaccine: Marland Kitchen Difficulty breathing  . Swelling of face and throat  . A fast heartbeat  . A bad rash all over body  . Dizziness and weakness   Immunizations Administered    Name Date Dose VIS Date Route   Moderna COVID-19 Vaccine 05/25/2019  3:25 PM 0.5 mL 01/2019 Intramuscular   Manufacturer: Moderna   Lot: [id number]   [place]: [phone]

## 2019-05-26 NOTE — Telephone Encounter (Signed)
[facility] calling back in regards to med appeal. Med questions need answer. Please advise [phone] option 3  Reference # [id number]

## 2019-05-26 NOTE — Telephone Encounter (Signed)
Spoke with Isabel Garcia, she states the patient has to try and fail the alternatives listed below. Dr. Halford Chessman did you want to switch pt to one of the alternatives?  Advair, Symbicort, or Trelegy

## 2019-05-27 MED ORDER — FLUTICASONE-SALMETEROL 250-50 MCG/DOSE IN AEPB: 1.0000 | INHALATION_SPRAY | Freq: Two times a day (BID) | RESPIRATORY_TRACT | 5 refills | Status: DC

## 2019-05-27 NOTE — Telephone Encounter (Signed)
Spoke with pt. She is aware of this medication change. Rx has been sent in. Nothing further needed.

## 2019-05-27 NOTE — Telephone Encounter (Signed)
Please send script for advair 250/50 one puff bid.  Please let her know that change is required by her insurance.

## 2019-05-27 NOTE — H&P (Signed)
TOTAL KNEE ADMISSION H&P  Patient is being admitted for right total knee arthroplasty.  Subjective:  Chief Complaint:  Right knee primary OA / pain  HPI: Isabel Garcia, 64 y.o. female, has a history of pain and functional disability in the right knee due to arthritis and has failed non-surgical conservative treatments for greater than 12 weeks to include NSAID's and/or analgesics, corticosteriod injections, viscosupplementation injections and activity modification.  Onset of symptoms was gradual, starting >10 years ago with gradually worsening course since that time. The patient noted prior procedures on the knee to include  arthroscopy on the right knee(s).  Patient currently rates pain in the right knee(s) at 10 out of 10 with activity. Patient has night pain, worsening of pain with activity and weight bearing, pain that interferes with activities of daily living, pain with passive range of motion, crepitus and joint swelling.  Patient has evidence of periarticular osteophytes and joint space narrowing by imaging studies. There is no active infection.  Risks, benefits and expectations were discussed with the patient.  Risks including but not limited to the risk of anesthesia, blood clots, nerve damage, blood vessel damage, failure of the prosthesis, infection and up to and including death.  Patient understand the risks, benefits and expectations and wishes to proceed with surgery.   PCP: Marrian Salvage, FNP  D/C Plans:       Home   Post-op Meds:       No Rx given   Tranexamic Acid:      To be given - IV   Decadron:      Is to be given  FYI:      Eliquis (on pre-op previous PE/DVT)  Norco  DME:   Pt already has equipment  PT:   [place]: [place], [place], [place]   Patient Active Problem List   Diagnosis Date Noted  . History of DVT (deep vein thrombosis) 04/26/2018  . Current use of long term anticoagulation 04/26/2018  . Chronic low back pain  with right-sided sciatica 10/22/2017  . Chronic pain of right knee 10/22/2017  . Right foot pain 10/22/2017  . Neck pain 10/22/2017  . Obesity (BMI 35.0-39.9 without comorbidity) 10/22/2017  . Healthcare maintenance 10/20/2017  . History of pulmonary embolus (PE) 10/20/2017  . GERD (gastroesophageal reflux disease) 09/08/2017  . Cough 08/11/2017  . Essential hypertension 08/11/2017  . Obstructive sleep apnea 08/11/2017   Past Medical History:  Diagnosis Date  . Achilles tendinitis   . Arthritis   . Asthma   . Diverticulitis   . Diverticulosis   . GERD (gastroesophageal reflux disease)   . Heart murmur   . History of blood clots   . History of chicken pox   . History of pulmonary embolus (PE)   . Hypertension   . Liver cirrhosis secondary to NASH (nonalcoholic steatohepatitis) ([place])   . Osteoarthritis   . Phlebitis   . Plantar fasciitis   . Vertigo     Past Surgical History:  Procedure Laterality Date  . ABDOMINAL HYSTERECTOMY    . bone spur removal    . KNEE ARTHROSCOPY Right   . KNEE ARTHROSCOPY Right     No current facility-administered medications for this encounter.   Current Outpatient Medications  Medication Sig Dispense Refill Last Dose  . albuterol (PROVENTIL HFA) 108 (90 Base) MCG/ACT inhaler Inhale 2 puffs into the lungs every 6 (six) hours as needed for wheezing or shortness of  breath.      Marland Kitchen amLODipine (NORVASC) 10 MG tablet Take 1 tablet (10 mg total) by mouth daily. for high blood pressure 30 tablet 11   . apixaban (ELIQUIS) 5 MG TABS tablet Take 1 tablet (5 mg total) by mouth 2 (two) times daily. 60 tablet 1   . atorvastatin (LIPITOR) 20 MG tablet Take 1 tablet (20 mg total) by mouth daily. (Patient taking differently: Take 20 mg by mouth at bedtime. ) 90 tablet 0   . cholecalciferol (VITAMIN D3) 25 MCG (1000 UNIT) tablet Take 1,000 Units by mouth daily.     . COD LIVER OIL PO Take 1 capsule by mouth daily.     . diclofenac Sodium (VOLTAREN) 1 % GEL  Apply 2 g topically 2 (two) times daily as needed (joint pain).      . fluticasone (FLONASE) 50 MCG/ACT nasal spray Place 2 sprays into both nostrils daily. 16 g 11   . meclizine (ANTIVERT) 25 MG tablet Take 1 tablet (25 mg total) by mouth 3 (three) times daily as needed for dizziness. 30 tablet 0   . montelukast (SINGULAIR) 10 MG tablet Take 1 tablet (10 mg total) by mouth at bedtime. 30 tablet 2   . omeprazole (PRILOSEC) 40 MG capsule Take 1 capsule (40 mg total) by mouth daily. 30 capsule 3   . PENNSAID 2 % SOLN Apply 2 Pump topically 2 (two) times daily as needed (pain).      . Zinc Sulfate (ZINC 15 PO) Take 1 capsule by mouth daily.     . fluticasone furoate-vilanterol (BREO ELLIPTA) 100-25 MCG/INH AEPB Inhale 1 puff into the lungs daily. (Patient not taking: Reported on 05/19/2019) 1 each 0 Not Taking at Unknown time  . Fluticasone-Salmeterol (ADVAIR DISKUS) 250-50 MCG/DOSE AEPB Inhale 1 puff into the lungs 2 (two) times daily. 60 each 5    No Known Allergies   Social History   Tobacco Use  . Smoking status: Never Smoker  . Smokeless tobacco: Never Used  Substance Use Topics  . Alcohol use: Not Currently    Family History  Problem Relation Age of Onset  . Arthritis Mother   . Hypertension Mother   . Hypertension Father   . Heart attack Father   . Early death Father   . Asthma Sister   . Heart attack Brother   . Early death Brother   . Hypertension Brother   . Stroke Maternal Grandmother   . Heart attack Maternal Grandfather   . Heart attack Paternal Grandfather   . Early death Paternal Grandfather      Review of Systems  Constitutional: Negative.   HENT: Negative.   Eyes: Negative.   Respiratory: Negative.   Cardiovascular: Negative.   Gastrointestinal: Positive for heartburn.  Genitourinary: Negative.   Musculoskeletal: Positive for joint pain.  Skin: Negative.   Neurological: Positive for headaches.  Endo/Heme/Allergies: Negative.   Psychiatric/Behavioral:  Negative.      Objective:  Physical Exam  Constitutional: She is oriented to person, place, and time. She appears well-developed.  HENT:  Head: Normocephalic.  Eyes: Pupils are equal, round, and reactive to light.  Neck: No JVD present. No tracheal deviation present. No thyromegaly present.  Cardiovascular: Normal rate, regular rhythm and intact distal pulses.  Respiratory: Effort normal and breath sounds normal. No respiratory distress. She has no wheezes.  GI: Soft. There is no abdominal tenderness. There is no guarding.  Musculoskeletal:     Cervical back: Neck supple.  Right knee: Swelling and bony tenderness present. No deformity, ecchymosis or lacerations. Decreased range of motion. Tenderness present. Abnormal alignment.  Lymphadenopathy:    She has no cervical adenopathy.  Neurological: She is alert and oriented to person, place, and time.  Skin: Skin is warm and dry.  Psychiatric: She has a normal mood and affect.      Labs:  Estimated body mass index is 35.83 kg/m as calculated from the following:   Height as of 04/19/19: 5' 6"  (1.676 m).   Weight as of 05/10/19: 100.7 kg.   Imaging Review Plain radiographs demonstrate severe degenerative joint disease of the right knee. The bone quality appears to be good for age and reported activity level.      Assessment/Plan:  End stage arthritis, right knee   The patient history, physical examination, clinical judgment of the provider and imaging studies are consistent with end stage degenerative joint disease of the right knee and total knee arthroplasty is deemed medically necessary. The treatment options including medical management, injection therapy arthroscopy and arthroplasty were discussed at length. The risks and benefits of total knee arthroplasty were presented and reviewed. The risks due to aseptic loosening, infection, stiffness, patella tracking problems, thromboembolic complications and other imponderables  were discussed. The patient acknowledged the explanation, agreed to proceed with the plan and consent was signed. Patient is being admitted for inpatient treatment for surgery, pain control, PT, OT, prophylactic antibiotics, VTE prophylaxis, progressive ambulation and ADL's and discharge planning. The patient is planning to be discharged home.      Patient's anticipated LOS is less than 2 midnights, meeting these requirements: - Younger than 40 - Lives within 1 hour of care - Has a competent adult at home to recover with post-op recover - NO history of  - Chronic pain requiring opiods  - Diabetes  - Coronary Artery Disease  - Heart failure  - Heart attack  - Stroke  - Cardiac arrhythmia  - Respiratory Failure/COPD  - Renal failure  - Anemia  - Advanced Liver disease    [place] Pugh. Phares Zaccone   PA-C  05/27/2019, 9:33 AM

## 2019-06-01 ENCOUNTER — Encounter: Payer: Self-pay | Admitting: Family

## 2019-06-01 NOTE — Patient Instructions (Signed)
DUE TO COVID-19 ONLY ONE VISITOR IS ALLOWED TO COME WITH YOU AND STAY IN THE WAITING ROOM ONLY DURING PRE OP AND PROCEDURE DAY OF SURGERY. THE 2 VISITORS MAY VISIT WITH YOU AFTER SURGERY IN YOUR PRIVATE ROOM DURING VISITING HOURS ONLY!  YOU NEED TO HAVE A COVID 19 TEST ON__4/30_____ @__2 :05_____, THIS TEST MUST BE DONE BEFORE SURGERY, COME  [address] [place] , [id number].  ([place]) ONCE YOUR COVID TEST IS COMPLETED, PLEASE BEGIN THE QUARANTINE INSTRUCTIONS AS OUTLINED IN YOUR HANDOUT.                MAALLE STARRETT    Your procedure is scheduled on: 06/07/19   Report to [facility] Main  Entrance   Report to admitting at  10:25 AM     Call this number if you have problems the morning of surgery [place], NO CHEWING GUM [place].   Do not eat food After Midnight.   YOU MAY HAVE CLEAR LIQUIDS FROM MIDNIGHT UNTIL 9:30 AM.    CLEAR LIQUID DIET   Foods Allowed                                                                     Foods Excluded  Coffee and tea, regular and decaf                             liquids that you cannot  Plain Jell-O any favor except red or purple                                           see through such as: Fruit ices (not with fruit pulp)                                     milk, soups, orange juice  Iced Popsicles                                    All solid food Carbonated beverages, regular and diet                                    Cranberry, grape and apple juices Sports drinks like Gatorade Lightly seasoned clear broth or consume(fat free) Sugar, honey syrup     At 9:30 AM Please finish the prescribed Pre-Surgery  Drink  . Nothing by mouth after you finish the  drink !    Take these medicines the morning of surgery with A SIP OF WATER: Amlodipine, Prilosec, Use Flonase if needed. Use your inhalers and bring them with you to the  hospital  You may not have any metal on your body including hair pins and             piercings  Do not wear jewelry, make-up, lotions, powders or perfumes, deodorant             Do not wear nail polish on your fingernails.  Do not shave  48 hours prior to surgery.     Do not bring valuables to the hospital. [place].  Contacts, dentures or bridgework may not be worn into surgery.       Patients discharged the day of surgery will not be allowed to drive home.   IF YOU ARE HAVING SURGERY AND GOING HOME THE SAME DAY, YOU MUST HAVE AN ADULT TO DRIVE YOU HOME AND BE WITH YOU FOR 24 HOURS  . YOU MAY GO HOME BY TAXI OR UBER OR ORTHERWISE, BUT AN ADULT MUST ACCOMPANY YOU HOME AND STAY WITH YOU FOR 24 HOURS.  Name and phone number of your driver:  Special Instructions: N/A              Please read over the following fact sheets you were given: _____________________________________________________________________             [facility] - Preparing for Surgery Before surgery, you can play an important role.   Because skin is not sterile, your skin needs to be as free of germs as possible.   You can reduce the number of germs on your skin by washing with CHG (chlorahexidine gluconate) soap before surgery.   CHG is an antiseptic cleaner which kills germs and bonds with the skin to continue killing germs even after washing. Please DO NOT use if you have an allergy to CHG or antibacterial soaps.   If your skin becomes reddened/irritated stop using the CHG and inform your nurse when you arrive at Short Stay. Do not shave (including legs and underarms) for at least 48 hours prior to the first CHG shower.    Please follow these instructions carefully:  1.  Shower with CHG Soap the night before surgery and the  morning of Surgery.  2.  If you choose to wash your hair, wash your hair first as usual with your   normal  shampoo.  3.  After you shampoo, rinse your hair and body thoroughly to remove the  shampoo.                                        4.  Use CHG as you would any other liquid soap.  You can apply chg directly  to the skin and wash                       Gently with a scrungie or clean washcloth.  5.  Apply the CHG Soap to your body ONLY FROM THE NECK DOWN.   Do not use on face/ open                           Wound or open sores. Avoid contact with eyes, ears mouth and genitals (private parts).  Wash face,  Genitals (private parts) with your normal soap.             6.  Wash thoroughly, paying special attention to the area where your surgery  will be performed.  7.  Thoroughly rinse your body with warm water from the neck down.  8.  DO NOT shower/wash with your normal soap after using and rinsing off  the CHG Soap.             9.  Pat yourself dry with a clean towel.            10.  Wear clean pajamas.            11.  Place clean sheets on your bed the night of your first shower and do not  sleep with pets. Day of Surgery : Do not apply any lotions/deodorants the morning of surgery.  Please wear clean clothes to the hospital/surgery center.   Incentive Spirometer  An incentive spirometer is a tool that can help keep your lungs clear and active. This tool measures how well you are filling your lungs with each breath. Taking long deep breaths may help reverse or decrease the chance of developing breathing (pulmonary) problems (especially infection) following:  A long period of time when you are unable to move or be active. BEFORE THE PROCEDURE   If the spirometer includes an indicator to show your best effort, your nurse or respiratory therapist will set it to a desired goal.  If possible, sit up straight or lean slightly forward. Try not to slouch.  Hold the incentive spirometer in an upright position. INSTRUCTIONS FOR USE  1. Sit on the edge of your bed if  possible, or sit up as far as you can in bed or on a chair. 2. Hold the incentive spirometer in an upright position. 3. Breathe out normally. 4. Place the mouthpiece in your mouth and seal your lips tightly around it. 5. Breathe in slowly and as deeply as possible, raising the piston or the ball toward the top of the column. 6. Hold your breath for 3-5 seconds or for as long as possible. Allow the piston or ball to fall to the bottom of the column. 7. Remove the mouthpiece from your mouth and breathe out normally. 8. Rest for a few seconds and repeat Steps 1 through 7 at least 10 times every 1-2 hours when you are awake. Take your time and take a few normal breaths between deep breaths. 9. The spirometer may include an indicator to show your best effort. Use the indicator as a goal to work toward during each repetition. 10. After each set of 10 deep breaths, practice coughing to be sure your lungs are clear. If you have an incision (the cut made at the time of surgery), support your incision when coughing by placing a pillow or rolled up towels firmly against it. Once you are able to get out of bed, walk around indoors and cough well. You may stop using the incentive spirometer when instructed by your caregiver.  RISKS AND COMPLICATIONS  Take your time so you do not get dizzy or light-headed.  If you are in pain, you may need to take or ask for pain medication before doing incentive spirometry. It is harder to take a deep breath if you are having pain. AFTER USE  Rest and breathe slowly and easily.  It can be helpful to keep track of a log of your progress. Your  caregiver can provide you with a simple table to help with this. If you are using the spirometer at home, follow these instructions: [place] IF:   You are having difficultly using the spirometer.  You have trouble using the spirometer as often as instructed.  Your pain medication is not giving enough relief while using  the spirometer.  You develop fever of 100.5 F (38.1 C) or higher. SEEK IMMEDIATE MEDICAL CARE IF:   You cough up bloody sputum that had not been present before.  You develop fever of 102 F (38.9 C) or greater.  You develop worsening pain at or near the incision site. MAKE SURE YOU:   Understand these instructions.  Will watch your condition.  Will get help right away if you are not doing well or get worse. Document Released: 06/02/2006 Document Revised: 04/14/2011 Document Reviewed: 08/03/2006 ExitCare Patient Information 2014 [place].   ________________________________________________________________________  FAILURE TO FOLLOW THESE INSTRUCTIONS MAY RESULT IN THE CANCELLATION OF YOUR SURGERY PATIENT SIGNATURE_________________________________  NURSE SIGNATURE__________________________________  ________________________________________________________________________

## 2019-06-02 ENCOUNTER — Other Ambulatory Visit: Payer: Self-pay

## 2019-06-02 ENCOUNTER — Encounter (HOSPITAL_COMMUNITY)
Admission: RE | Admit: 2019-06-02 | Discharge: 2019-06-02 | Disposition: A | Discharge status: Home / Self Care | Payer: 59 | Source: Ambulatory Visit | Attending: Orthopedic Surgery | Admitting: Orthopedic Surgery

## 2019-06-02 ENCOUNTER — Encounter (HOSPITAL_COMMUNITY): Payer: Self-pay

## 2019-06-02 DIAGNOSIS — Z01812 Encounter for preprocedural laboratory examination: Secondary | ICD-10-CM | POA: Insufficient documentation

## 2019-06-02 LAB — CBC
HCT: 37.4 % (ref 36.0–46.0)
Hemoglobin: 12.5 g/dL (ref 12.0–15.0)
MCH: 30.6 pg (ref 26.0–34.0)
MCHC: 33.4 g/dL (ref 30.0–36.0)
MCV: 91.4 fL (ref 80.0–100.0)
Platelets: 295 10*3/uL (ref 150–400)
RBC: 4.09 MIL/uL (ref 3.87–5.11)
RDW: 13.2 % (ref 11.5–15.5)
WBC: 4.9 10*3/uL (ref 4.0–10.5)
nRBC: 0 % (ref 0.0–0.2)

## 2019-06-02 LAB — COMPREHENSIVE METABOLIC PANEL
ALT: 34 U/L (ref 0–44)
AST: 27 U/L (ref 15–41)
Albumin: 3.9 g/dL (ref 3.5–5.0)
Alkaline Phosphatase: 71 U/L (ref 38–126)
Anion gap: 9 (ref 5–15)
BUN: 13 mg/dL (ref 8–23)
CO2: 24 mmol/L (ref 22–32)
Calcium: 9.2 mg/dL (ref 8.9–10.3)
Chloride: 106 mmol/L (ref 98–111)
Creatinine, Ser: 0.76 mg/dL (ref 0.44–1.00)
GFR calc Af Amer: >60 mL/min (ref >60)
GFR calc non Af Amer: >60 mL/min (ref >60)
Glucose, Bld: 95 mg/dL (ref 70–99)
Potassium: 3.5 mmol/L (ref 3.5–5.1)
Sodium: 139 mmol/L (ref 135–145)
Total Bilirubin: 0.5 mg/dL (ref 0.3–1.2)
Total Protein: 7.8 g/dL (ref 6.5–8.1)

## 2019-06-02 LAB — SURGICAL PCR SCREEN
MRSA, PCR: NEGATIVE
Staphylococcus aureus: NEGATIVE

## 2019-06-02 LAB — ABO/RH: ABO/RH(D): O POS

## 2019-06-02 NOTE — Progress Notes (Signed)
PCP - L. [facility] FNP Cardiologist - no  Chest x-ray - 07/02/18 EKG - 07/02/18 Stress Test -  ECHO - 2019 Cardiac Cath -   Sleep Study - yes CPAP - no  Fasting Blood Sugar - NA Checks Blood Sugar _____ times a day  Blood Thinner Instructions:Eliquis Aspirin Instructions:Dr. Alvan Dame said ti stop 3 days prior to DOS Last Dose:06/03/19  Anesthesia review:   Patient denies shortness of breath, fever, cough and chest pain at PAT appointment yes  Patient verbalized understanding of instructions that were given to them at the PAT appointment. Patient was also instructed that they will need to review over the PAT instructions again at home before surgery. yes

## 2019-06-03 ENCOUNTER — Other Ambulatory Visit (HOSPITAL_COMMUNITY)
Admission: RE | Admit: 2019-06-03 | Discharge: 2019-06-03 | Disposition: A | Discharge status: Home / Self Care | Payer: 59 | Source: Ambulatory Visit | Attending: Orthopedic Surgery | Admitting: Orthopedic Surgery

## 2019-06-03 DIAGNOSIS — Z20822 Contact with and (suspected) exposure to covid-19: Secondary | ICD-10-CM | POA: Insufficient documentation

## 2019-06-03 DIAGNOSIS — Z01812 Encounter for preprocedural laboratory examination: Secondary | ICD-10-CM | POA: Insufficient documentation

## 2019-06-04 LAB — SARS CORONAVIRUS 2 (TAT 6-24 HRS): SARS Coronavirus 2: NEGATIVE

## 2019-06-07 ENCOUNTER — Ambulatory Visit (HOSPITAL_COMMUNITY): Payer: 59 | Admitting: Physician Assistant

## 2019-06-07 ENCOUNTER — Other Ambulatory Visit: Payer: Self-pay

## 2019-06-07 ENCOUNTER — Telehealth (HOSPITAL_COMMUNITY): Payer: Self-pay | Admitting: *Deleted

## 2019-06-07 ENCOUNTER — Encounter (HOSPITAL_COMMUNITY): Payer: Self-pay | Admitting: Orthopedic Surgery

## 2019-06-07 ENCOUNTER — Observation Stay (HOSPITAL_COMMUNITY)
Admission: RE | Admit: 2019-06-07 | Discharge: 2019-06-08 | Disposition: A | Discharge status: Home / Self Care | Payer: 59 | Attending: Orthopedic Surgery | Admitting: Orthopedic Surgery

## 2019-06-07 ENCOUNTER — Encounter (HOSPITAL_COMMUNITY)
Admission: RE | Disposition: A | Discharge status: Home / Self Care | Payer: Self-pay | Source: Home / Self Care | Attending: Orthopedic Surgery

## 2019-06-07 ENCOUNTER — Ambulatory Visit (HOSPITAL_COMMUNITY): Payer: 59 | Admitting: Anesthesiology

## 2019-06-07 DIAGNOSIS — Z96651 Presence of right artificial knee joint: Secondary | ICD-10-CM

## 2019-06-07 DIAGNOSIS — Z7951 Long term (current) use of inhaled steroids: Secondary | ICD-10-CM | POA: Diagnosis not present

## 2019-06-07 DIAGNOSIS — E669 Obesity, unspecified: Secondary | ICD-10-CM | POA: Diagnosis not present

## 2019-06-07 DIAGNOSIS — Z86711 Personal history of pulmonary embolism: Secondary | ICD-10-CM | POA: Insufficient documentation

## 2019-06-07 DIAGNOSIS — Z79899 Other long term (current) drug therapy: Secondary | ICD-10-CM | POA: Diagnosis not present

## 2019-06-07 DIAGNOSIS — Z7901 Long term (current) use of anticoagulants: Secondary | ICD-10-CM | POA: Diagnosis not present

## 2019-06-07 DIAGNOSIS — G4733 Obstructive sleep apnea (adult) (pediatric): Secondary | ICD-10-CM | POA: Insufficient documentation

## 2019-06-07 DIAGNOSIS — I110 Hypertensive heart disease with heart failure: Secondary | ICD-10-CM | POA: Insufficient documentation

## 2019-06-07 DIAGNOSIS — M1711 Unilateral primary osteoarthritis, right knee: Secondary | ICD-10-CM | POA: Diagnosis not present

## 2019-06-07 DIAGNOSIS — Z6836 Body mass index (BMI) 36.0-36.9, adult: Secondary | ICD-10-CM | POA: Diagnosis not present

## 2019-06-07 DIAGNOSIS — G8929 Other chronic pain: Secondary | ICD-10-CM | POA: Diagnosis not present

## 2019-06-07 DIAGNOSIS — Z86718 Personal history of other venous thrombosis and embolism: Secondary | ICD-10-CM | POA: Insufficient documentation

## 2019-06-07 DIAGNOSIS — K219 Gastro-esophageal reflux disease without esophagitis: Secondary | ICD-10-CM | POA: Diagnosis not present

## 2019-06-07 HISTORY — PX: TOTAL KNEE ARTHROPLASTY: SHX125

## 2019-06-07 LAB — TYPE AND SCREEN
ABO/RH(D): O POS
Antibody Screen: NEGATIVE

## 2019-06-07 SURGERY — ARTHROPLASTY, KNEE, TOTAL
Anesthesia: Regional | Site: Knee | Laterality: Right

## 2019-06-07 MED ORDER — METOCLOPRAMIDE HCL 5 MG PO TABS: 5.0000 mg | ORAL_TABLET | Freq: Three times a day (TID) | ORAL | Status: DC | PRN

## 2019-06-07 MED ORDER — ONDANSETRON HCL 4 MG/2ML IJ SOLN
INTRAMUSCULAR | Status: AC
  Filled 2019-06-07: qty 2

## 2019-06-07 MED ORDER — LACTATED RINGERS IV SOLN: INTRAVENOUS | Status: DC

## 2019-06-07 MED ORDER — MIDAZOLAM HCL 2 MG/2ML IJ SOLN
1.0000 mg | INTRAMUSCULAR | Status: DC
  Administered 2019-06-07: 2 mg via INTRAVENOUS
  Filled 2019-06-07: qty 2

## 2019-06-07 MED ORDER — SODIUM CHLORIDE 0.9 % IV SOLN: INTRAVENOUS | Status: DC

## 2019-06-07 MED ORDER — TRANEXAMIC ACID-NACL 1000-0.7 MG/100ML-% IV SOLN
1000.0000 mg | INTRAVENOUS | Status: AC
  Administered 2019-06-07: 1000 mg via INTRAVENOUS
  Filled 2019-06-07: qty 100

## 2019-06-07 MED ORDER — OMEPRAZOLE 20 MG PO CPDR
40.0000 mg | DELAYED_RELEASE_CAPSULE | Freq: Every day | ORAL | Status: DC
  Administered 2019-06-08: 40 mg via ORAL
  Filled 2019-06-07: qty 2

## 2019-06-07 MED ORDER — CEFAZOLIN SODIUM-DEXTROSE 2-4 GM/100ML-% IV SOLN
2.0000 g | INTRAVENOUS | Status: AC
  Administered 2019-06-07: 2 g via INTRAVENOUS
  Filled 2019-06-07: qty 100

## 2019-06-07 MED ORDER — MOMETASONE FURO-FORMOTEROL FUM 200-5 MCG/ACT IN AERO
2.0000 | INHALATION_SPRAY | Freq: Two times a day (BID) | RESPIRATORY_TRACT | Status: DC
  Administered 2019-06-08: 2 via RESPIRATORY_TRACT
  Filled 2019-06-07: qty 8.8

## 2019-06-07 MED ORDER — MAGNESIUM CITRATE PO SOLN: 1.0000 | Freq: Once | ORAL | Status: DC | PRN

## 2019-06-07 MED ORDER — ONDANSETRON HCL 4 MG/2ML IJ SOLN: 4.0000 mg | Freq: Four times a day (QID) | INTRAMUSCULAR | Status: DC | PRN

## 2019-06-07 MED ORDER — TRANEXAMIC ACID-NACL 1000-0.7 MG/100ML-% IV SOLN
1000.0000 mg | Freq: Once | INTRAVENOUS | Status: AC
  Administered 2019-06-07: 1000 mg via INTRAVENOUS
  Filled 2019-06-07: qty 100

## 2019-06-07 MED ORDER — HYDROCODONE-ACETAMINOPHEN 7.5-325 MG PO TABS
1.0000 | ORAL_TABLET | ORAL | Status: DC | PRN
  Administered 2019-06-07 – 2019-06-08 (×4): 1 via ORAL
  Filled 2019-06-07 (×5): qty 1

## 2019-06-07 MED ORDER — MEPIVACAINE HCL (PF) 2 % IJ SOLN
INTRAMUSCULAR | Status: AC
  Filled 2019-06-07: qty 20

## 2019-06-07 MED ORDER — ACETAMINOPHEN 325 MG PO TABS: 325.0000 mg | ORAL_TABLET | Freq: Four times a day (QID) | ORAL | Status: DC | PRN

## 2019-06-07 MED ORDER — HYDROMORPHONE HCL 1 MG/ML IJ SOLN
INTRAMUSCULAR | Status: AC
  Filled 2019-06-07: qty 1

## 2019-06-07 MED ORDER — DEXAMETHASONE SODIUM PHOSPHATE 10 MG/ML IJ SOLN
10.0000 mg | Freq: Once | INTRAMUSCULAR | Status: AC
  Administered 2019-06-07: 10 mg via INTRAVENOUS

## 2019-06-07 MED ORDER — POLYETHYLENE GLYCOL 3350 17 G PO PACK
17.0000 g | PACK | Freq: Two times a day (BID) | ORAL | Status: DC
  Administered 2019-06-07 – 2019-06-08 (×2): 17 g via ORAL
  Filled 2019-06-07 (×2): qty 1

## 2019-06-07 MED ORDER — DEXAMETHASONE SODIUM PHOSPHATE 10 MG/ML IJ SOLN
INTRAMUSCULAR | Status: AC
  Filled 2019-06-07: qty 1

## 2019-06-07 MED ORDER — FERROUS SULFATE 325 (65 FE) MG PO TABS
325.0000 mg | ORAL_TABLET | Freq: Two times a day (BID) | ORAL | Status: DC
  Administered 2019-06-08: 325 mg via ORAL
  Filled 2019-06-07: qty 1

## 2019-06-07 MED ORDER — DEXAMETHASONE SODIUM PHOSPHATE 10 MG/ML IJ SOLN
10.0000 mg | Freq: Once | INTRAMUSCULAR | Status: AC
  Administered 2019-06-08: 10 mg via INTRAVENOUS
  Filled 2019-06-07: qty 1

## 2019-06-07 MED ORDER — FENTANYL CITRATE (PF) 100 MCG/2ML IJ SOLN
50.0000 ug | INTRAMUSCULAR | Status: DC
  Administered 2019-06-07: 50 ug via INTRAVENOUS
  Filled 2019-06-07: qty 2

## 2019-06-07 MED ORDER — SODIUM CHLORIDE (PF) 0.9 % IJ SOLN
INTRAMUSCULAR | Status: AC
  Filled 2019-06-07: qty 50

## 2019-06-07 MED ORDER — FENTANYL CITRATE (PF) 100 MCG/2ML IJ SOLN
INTRAMUSCULAR | Status: DC | PRN
  Administered 2019-06-07 (×4): 50 ug via INTRAVENOUS

## 2019-06-07 MED ORDER — PHENOL 1.4 % MT LIQD: 1.0000 | OROMUCOSAL | Status: DC | PRN

## 2019-06-07 MED ORDER — BISACODYL 10 MG RE SUPP: 10.0000 mg | Freq: Every day | RECTAL | Status: DC | PRN

## 2019-06-07 MED ORDER — KETOROLAC TROMETHAMINE 30 MG/ML IJ SOLN
INTRAMUSCULAR | Status: DC | PRN
  Administered 2019-06-07: 30 mg

## 2019-06-07 MED ORDER — HYDROMORPHONE HCL 1 MG/ML IJ SOLN
0.2500 mg | INTRAMUSCULAR | Status: DC | PRN
  Administered 2019-06-07 (×2): 0.5 mg via INTRAVENOUS

## 2019-06-07 MED ORDER — ACETAMINOPHEN 500 MG PO TABS
1000.0000 mg | ORAL_TABLET | Freq: Once | ORAL | Status: AC
  Administered 2019-06-07: 1000 mg via ORAL
  Filled 2019-06-07: qty 2

## 2019-06-07 MED ORDER — FENTANYL CITRATE (PF) 100 MCG/2ML IJ SOLN
INTRAMUSCULAR | Status: AC
  Filled 2019-06-07: qty 2

## 2019-06-07 MED ORDER — ALBUTEROL SULFATE (2.5 MG/3ML) 0.083% IN NEBU: 3.0000 mL | INHALATION_SOLUTION | Freq: Four times a day (QID) | RESPIRATORY_TRACT | Status: DC | PRN

## 2019-06-07 MED ORDER — STERILE WATER FOR IRRIGATION IR SOLN
Status: DC | PRN
  Administered 2019-06-07: 2000 mL

## 2019-06-07 MED ORDER — HYDROMORPHONE HCL 1 MG/ML IJ SOLN
INTRAMUSCULAR | Status: DC | PRN
  Administered 2019-06-07 (×4): .5 mg via INTRAVENOUS

## 2019-06-07 MED ORDER — SODIUM CHLORIDE 0.9 % IR SOLN
Status: DC | PRN
  Administered 2019-06-07: 1000 mL

## 2019-06-07 MED ORDER — NON FORMULARY: 40.0000 mg | Freq: Every day | Status: DC

## 2019-06-07 MED ORDER — DOCUSATE SODIUM 100 MG PO CAPS
100.0000 mg | ORAL_CAPSULE | Freq: Two times a day (BID) | ORAL | Status: DC
  Administered 2019-06-07 – 2019-06-08 (×2): 100 mg via ORAL
  Filled 2019-06-07 (×2): qty 1

## 2019-06-07 MED ORDER — DEXAMETHASONE SODIUM PHOSPHATE 10 MG/ML IJ SOLN
INTRAMUSCULAR | Status: DC | PRN
  Administered 2019-06-07: 10 mg

## 2019-06-07 MED ORDER — SODIUM CHLORIDE (PF) 0.9 % IJ SOLN
INTRAMUSCULAR | Status: DC | PRN
  Administered 2019-06-07: 30 mL

## 2019-06-07 MED ORDER — ALUM & MAG HYDROXIDE-SIMETH 200-200-20 MG/5ML PO SUSP: 15.0000 mL | ORAL | Status: DC | PRN

## 2019-06-07 MED ORDER — PROPOFOL 500 MG/50ML IV EMUL
INTRAVENOUS | Status: AC
  Filled 2019-06-07: qty 50

## 2019-06-07 MED ORDER — HYDROMORPHONE HCL 2 MG/ML IJ SOLN
INTRAMUSCULAR | Status: AC
  Filled 2019-06-07: qty 1

## 2019-06-07 MED ORDER — HYDROCODONE-ACETAMINOPHEN 5-325 MG PO TABS: 1.0000 | ORAL_TABLET | ORAL | Status: DC | PRN

## 2019-06-07 MED ORDER — APIXABAN 2.5 MG PO TABS
2.5000 mg | ORAL_TABLET | Freq: Two times a day (BID) | ORAL | Status: DC
  Administered 2019-06-08: 2.5 mg via ORAL
  Filled 2019-06-07: qty 1

## 2019-06-07 MED ORDER — PROMETHAZINE HCL 25 MG/ML IJ SOLN: 6.2500 mg | INTRAMUSCULAR | Status: DC | PRN

## 2019-06-07 MED ORDER — GLYCOPYRROLATE PF 0.2 MG/ML IJ SOSY
PREFILLED_SYRINGE | INTRAMUSCULAR | Status: DC | PRN
  Administered 2019-06-07: .2 mg via INTRAVENOUS

## 2019-06-07 MED ORDER — PHENYLEPHRINE 40 MCG/ML (10ML) SYRINGE FOR IV PUSH (FOR BLOOD PRESSURE SUPPORT)
PREFILLED_SYRINGE | INTRAVENOUS | Status: AC
  Filled 2019-06-07: qty 20

## 2019-06-07 MED ORDER — ONDANSETRON HCL 4 MG PO TABS: 4.0000 mg | ORAL_TABLET | Freq: Four times a day (QID) | ORAL | Status: DC | PRN

## 2019-06-07 MED ORDER — MENTHOL 3 MG MT LOZG: 1.0000 | LOZENGE | OROMUCOSAL | Status: DC | PRN

## 2019-06-07 MED ORDER — METHOCARBAMOL 500 MG IVPB - SIMPLE MED
500.0000 mg | Freq: Four times a day (QID) | INTRAVENOUS | Status: DC | PRN
  Administered 2019-06-07: 500 mg via INTRAVENOUS
  Filled 2019-06-07: qty 50

## 2019-06-07 MED ORDER — KETAMINE HCL 10 MG/ML IJ SOLN
INTRAMUSCULAR | Status: DC | PRN
  Administered 2019-06-07: 80 mg via INTRAVENOUS

## 2019-06-07 MED ORDER — POVIDONE-IODINE 10 % EX SWAB
2.0000 "application " | Freq: Once | CUTANEOUS | Status: AC
  Administered 2019-06-07: 2 via TOPICAL

## 2019-06-07 MED ORDER — KETOROLAC TROMETHAMINE 30 MG/ML IJ SOLN
INTRAMUSCULAR | Status: AC
  Filled 2019-06-07: qty 1

## 2019-06-07 MED ORDER — KETAMINE HCL 10 MG/ML IJ SOLN
INTRAMUSCULAR | Status: AC
  Filled 2019-06-07: qty 1

## 2019-06-07 MED ORDER — BUPIVACAINE HCL 0.25 % IJ SOLN
INTRAMUSCULAR | Status: AC
  Filled 2019-06-07: qty 1

## 2019-06-07 MED ORDER — ONDANSETRON HCL 4 MG/2ML IJ SOLN
INTRAMUSCULAR | Status: DC | PRN
  Administered 2019-06-07: 4 mg via INTRAVENOUS

## 2019-06-07 MED ORDER — MONTELUKAST SODIUM 10 MG PO TABS
10.0000 mg | ORAL_TABLET | Freq: Every day | ORAL | Status: DC
  Administered 2019-06-07: 10 mg via ORAL
  Filled 2019-06-07: qty 1

## 2019-06-07 MED ORDER — METHOCARBAMOL 500 MG IVPB - SIMPLE MED
INTRAVENOUS | Status: AC
  Filled 2019-06-07: qty 50

## 2019-06-07 MED ORDER — FLUTICASONE PROPIONATE 50 MCG/ACT NA SUSP
2.0000 | Freq: Every day | NASAL | Status: DC
  Filled 2019-06-07: qty 16

## 2019-06-07 MED ORDER — MECLIZINE HCL 25 MG PO TABS: 25.0000 mg | ORAL_TABLET | Freq: Three times a day (TID) | ORAL | Status: DC | PRN

## 2019-06-07 MED ORDER — PROPOFOL 500 MG/50ML IV EMUL
INTRAVENOUS | Status: DC | PRN
  Administered 2019-06-07: 100 ug/kg/min via INTRAVENOUS

## 2019-06-07 MED ORDER — METHOCARBAMOL 500 MG PO TABS
500.0000 mg | ORAL_TABLET | Freq: Four times a day (QID) | ORAL | Status: DC | PRN
  Administered 2019-06-08: 500 mg via ORAL
  Filled 2019-06-07: qty 1

## 2019-06-07 MED ORDER — OXYCODONE HCL 5 MG/5ML PO SOLN: 5.0000 mg | Freq: Once | ORAL | Status: DC | PRN

## 2019-06-07 MED ORDER — OMEPRAZOLE 20 MG PO CPDR: 40.0000 mg | DELAYED_RELEASE_CAPSULE | Freq: Every day | ORAL | Status: DC

## 2019-06-07 MED ORDER — ROPIVACAINE HCL 5 MG/ML IJ SOLN
INTRAMUSCULAR | Status: DC | PRN
  Administered 2019-06-07: 30 mL via PERINEURAL

## 2019-06-07 MED ORDER — PROPOFOL 10 MG/ML IV BOLUS
INTRAVENOUS | Status: DC | PRN
  Administered 2019-06-07: 120 mg via INTRAVENOUS
  Administered 2019-06-07: 20 mg via INTRAVENOUS

## 2019-06-07 MED ORDER — PHENYLEPHRINE HCL (PRESSORS) 10 MG/ML IV SOLN
INTRAVENOUS | Status: DC | PRN
Start: 2019-06-07 — End: 2019-06-07
  Administered 2019-06-07 (×3): 80 ug via INTRAVENOUS
  Administered 2019-06-07: 40 ug via INTRAVENOUS
  Administered 2019-06-07 (×2): 80 ug via INTRAVENOUS

## 2019-06-07 MED ORDER — CELECOXIB 200 MG PO CAPS
200.0000 mg | ORAL_CAPSULE | Freq: Two times a day (BID) | ORAL | Status: DC
  Administered 2019-06-07 – 2019-06-08 (×2): 200 mg via ORAL
  Filled 2019-06-07 (×2): qty 1

## 2019-06-07 MED ORDER — BUPIVACAINE HCL (PF) 0.25 % IJ SOLN
INTRAMUSCULAR | Status: DC | PRN
  Administered 2019-06-07: 30 mL

## 2019-06-07 MED ORDER — DIPHENHYDRAMINE HCL 12.5 MG/5ML PO ELIX: 12.5000 mg | ORAL_SOLUTION | ORAL | Status: DC | PRN

## 2019-06-07 MED ORDER — CEFAZOLIN SODIUM-DEXTROSE 2-4 GM/100ML-% IV SOLN
2.0000 g | Freq: Four times a day (QID) | INTRAVENOUS | Status: AC
  Administered 2019-06-07 (×2): 2 g via INTRAVENOUS
  Filled 2019-06-07 (×2): qty 100

## 2019-06-07 MED ORDER — OXYCODONE HCL 5 MG PO TABS: 5.0000 mg | ORAL_TABLET | Freq: Once | ORAL | Status: DC | PRN

## 2019-06-07 MED ORDER — METOCLOPRAMIDE HCL 5 MG/ML IJ SOLN: 5.0000 mg | Freq: Three times a day (TID) | INTRAMUSCULAR | Status: DC | PRN

## 2019-06-07 MED ORDER — AMLODIPINE BESYLATE 10 MG PO TABS: 10.0000 mg | ORAL_TABLET | Freq: Every day | ORAL | Status: DC

## 2019-06-07 MED ORDER — HYDROMORPHONE HCL 1 MG/ML IJ SOLN: 0.5000 mg | INTRAMUSCULAR | Status: DC | PRN

## 2019-06-07 MED ORDER — ATORVASTATIN CALCIUM 20 MG PO TABS
20.0000 mg | ORAL_TABLET | Freq: Every day | ORAL | Status: DC
  Administered 2019-06-07: 20 mg via ORAL
  Filled 2019-06-07: qty 1

## 2019-06-07 SURGICAL SUPPLY — 61 items
ATTUNE MED ANAT PAT 38 KNEE (Knees) ×2 IMPLANT
ATTUNE MED ANAT PAT 38MM KNEE (Knees) ×1 IMPLANT
ATTUNE PSFEM RTSZ5 NARCEM KNEE (Femur) ×3 IMPLANT
ATTUNE PSRP INSR SZ5 6 KNEE (Insert) ×2 IMPLANT
ATTUNE PSRP INSR SZ5 6MM KNEE (Insert) ×1 IMPLANT
BAG ZIPLOCK 12X15 (MISCELLANEOUS) IMPLANT
BASEPLATE TIBIAL ROTATING SZ 4 (Knees) ×3 IMPLANT
BLADE SAW SGTL 11.0X1.19X90.0M (BLADE) ×3 IMPLANT
BLADE SAW SGTL 13.0X1.19X90.0M (BLADE) ×3 IMPLANT
BLADE SURG SZ10 CARB STEEL (BLADE) ×6 IMPLANT
BNDG ELASTIC 6X5.8 VLCR STR LF (GAUZE/BANDAGES/DRESSINGS) ×3 IMPLANT
BOWL SMART MIX CTS (DISPOSABLE) ×3 IMPLANT
CEMENT HV SMART SET (Cement) ×6 IMPLANT
COVER SURGICAL LIGHT HANDLE (MISCELLANEOUS) ×3 IMPLANT
COVER WAND RF STERILE (DRAPES) IMPLANT
CUFF TOURN SGL QUICK 34 (TOURNIQUET CUFF) ×2
CUFF TRNQT CYL 34X4.125X (TOURNIQUET CUFF) ×1 IMPLANT
DECANTER SPIKE VIAL GLASS SM (MISCELLANEOUS) ×6 IMPLANT
DERMABOND ADVANCED (GAUZE/BANDAGES/DRESSINGS) ×2
DERMABOND ADVANCED .7 DNX12 (GAUZE/BANDAGES/DRESSINGS) ×1 IMPLANT
DRAPE U-SHAPE 47X51 STRL (DRAPES) ×3 IMPLANT
DRESSING AQUACEL AG SP 3.5X10 (GAUZE/BANDAGES/DRESSINGS) ×1 IMPLANT
DRSG AQUACEL AG SP 3.5X10 (GAUZE/BANDAGES/DRESSINGS) ×3
DURAPREP 26ML APPLICATOR (WOUND CARE) ×6 IMPLANT
ELECT REM PT RETURN 15FT ADLT (MISCELLANEOUS) ×3 IMPLANT
GLOVE BIO SURGEON STRL SZ 6 (GLOVE) ×3 IMPLANT
GLOVE BIOGEL PI IND STRL 6.5 (GLOVE) ×1 IMPLANT
GLOVE BIOGEL PI IND STRL 7.5 (GLOVE) ×1 IMPLANT
GLOVE BIOGEL PI IND STRL 8.5 (GLOVE) IMPLANT
GLOVE BIOGEL PI INDICATOR 6.5 (GLOVE) ×2
GLOVE BIOGEL PI INDICATOR 7.5 (GLOVE) ×2
GLOVE BIOGEL PI INDICATOR 8.5 (GLOVE)
GLOVE ECLIPSE 8.0 STRL XLNG CF (GLOVE) IMPLANT
GLOVE ORTHO TXT STRL SZ7.5 (GLOVE) ×3 IMPLANT
GOWN STRL REUS W/ TWL LRG LVL3 (GOWN DISPOSABLE) ×1 IMPLANT
GOWN STRL REUS W/TWL 2XL LVL3 (GOWN DISPOSABLE) ×3 IMPLANT
GOWN STRL REUS W/TWL LRG LVL3 (GOWN DISPOSABLE) ×5 IMPLANT
HANDPIECE INTERPULSE COAX TIP (DISPOSABLE) ×2
HOLDER FOLEY CATH W/STRAP (MISCELLANEOUS) IMPLANT
KIT TURNOVER KIT A (KITS) IMPLANT
MANIFOLD NEPTUNE II (INSTRUMENTS) ×3 IMPLANT
NDL SAFETY ECLIPSE 18X1.5 (NEEDLE) IMPLANT
NEEDLE HYPO 18GX1.5 SHARP (NEEDLE)
NS IRRIG 1000ML POUR BTL (IV SOLUTION) ×3 IMPLANT
PACK TOTAL KNEE CUSTOM (KITS) ×3 IMPLANT
PENCIL SMOKE EVACUATOR (MISCELLANEOUS) IMPLANT
PIN DRILL FIX HALF THREAD (BIT) ×3 IMPLANT
PIN FIX SIGMA LCS THRD HI (PIN) ×3 IMPLANT
PROTECTOR NERVE ULNAR (MISCELLANEOUS) ×3 IMPLANT
SET HNDPC FAN SPRY TIP SCT (DISPOSABLE) ×1 IMPLANT
SET PAD KNEE POSITIONER (MISCELLANEOUS) ×3 IMPLANT
SUT MNCRL AB 4-0 PS2 18 (SUTURE) ×3 IMPLANT
SUT STRATAFIX PDS+ 0 24IN (SUTURE) ×3 IMPLANT
SUT VIC AB 1 CT1 36 (SUTURE) ×3 IMPLANT
SUT VIC AB 2-0 CT1 27 (SUTURE) ×6
SUT VIC AB 2-0 CT1 TAPERPNT 27 (SUTURE) ×3 IMPLANT
SYR 3ML LL SCALE MARK (SYRINGE) ×3 IMPLANT
TRAY FOLEY MTR SLVR 16FR STAT (SET/KITS/TRAYS/PACK) IMPLANT
WATER STERILE IRR 1000ML POUR (IV SOLUTION) ×6 IMPLANT
WRAP KNEE MAXI GEL POST OP (GAUZE/BANDAGES/DRESSINGS) ×3 IMPLANT
YANKAUER SUCT BULB TIP 10FT TU (MISCELLANEOUS) ×3 IMPLANT

## 2019-06-07 NOTE — Interval H&P Note (Signed)
History and Physical Interval Note:  06/07/2019 10:49 AM  Isabel Garcia  has presented today for surgery, with the diagnosis of Right knee osteoarthritis.  The various methods of treatment have been discussed with the patient and family. After consideration of risks, benefits and other options for treatment, the patient has consented to  Procedure(s) with comments: TOTAL KNEE ARTHROPLASTY (Right) - 70 mins as a surgical intervention.  The patient's history has been reviewed, patient examined, no change in status, stable for surgery.  I have reviewed the patient's chart and labs.  Questions were answered to the patient's satisfaction.     Mauri Pole

## 2019-06-07 NOTE — Anesthesia Postprocedure Evaluation (Signed)
Anesthesia Post Note  Patient: [name]  Procedure(s) Performed: TOTAL KNEE ARTHROPLASTY (Right Knee)     Patient location during evaluation: PACU Anesthesia Type: Regional and General Level of consciousness: awake and alert, oriented and patient cooperative Pain management: pain level controlled Vital Signs Assessment: post-procedure vital signs reviewed and stable Respiratory status: spontaneous breathing, nonlabored ventilation and respiratory function stable Cardiovascular status: blood pressure returned to baseline and stable Postop Assessment: no apparent nausea or vomiting Anesthetic complications: no Comments: Unable to place spinal     Last Vitals:  Vitals:   06/07/19 1415 06/07/19 1430  BP: 137/86 125/77  Pulse: 68 (!) 57  Resp: 13 12  Temp:    SpO2: 100% 100%    Last Pain:  Vitals:   06/07/19 1412  TempSrc:   PainSc: 0-No pain                 Pervis Hocking

## 2019-06-07 NOTE — Evaluation (Signed)
Physical Therapy Evaluation Patient Details Name: Isabel Garcia MRN: [id number] DOB: [birth year] Today's Date: 06/07/2019   History of Present Illness  R TKA  Clinical Impression  Pt is s/p TKA resulting in the deficits listed below (see PT Problem List). Min A for supine to sit. Pt sat at edge of bed ~5 minutes. She was lethargic and was falling asleep while at edge of bed. She also reported nausea. Assisted pt back to supine and initiated TKA HEP. Good progress expected once nausea/lethargy resolve. Pt will benefit from skilled PT to increase their independence and safety with mobility to allow discharge to the venue listed below.      Follow Up Recommendations Follow surgeon's recommendation for DC plan and follow-up therapies    Equipment Recommendations  None recommended by PT    Recommendations for Other Services       Precautions / Restrictions Precautions Precautions: Knee Restrictions Weight Bearing Restrictions: No Other Position/Activity Restrictions: WBAT      Mobility  Bed Mobility Overal bed mobility: Needs Assistance Bed Mobility: Supine to Sit;Sit to Supine     Supine to sit: Min assist     General bed mobility comments: min A for RLE, pt was groggy, was falling asleep while placing food order on phone upon my entery, and falling asleep while sitting on edge of bed. She also reported nausea. Assisted her back to supine.  Transfers                 General transfer comment: NT- lethargic, not safe to transfer  Ambulation/Gait                Stairs            Wheelchair Mobility    Modified Rankin (Stroke Patients Only)       Balance Overall balance assessment: Needs assistance Sitting-balance support: Feet supported;No upper extremity supported Sitting balance-Leahy Scale: Good                                       Pertinent Vitals/Pain Pain Assessment: 0-10 Pain Score: 3  Pain Location: R knee Pain  Descriptors / Indicators: Sore Pain Intervention(s): Limited activity within patient's tolerance;Premedicated before session;Monitored during session;Ice applied    Home Living Family/patient expects to be discharged to:: Private residence Living Arrangements: Parent Available Help at Discharge: Family;Available PRN/intermittently   Home Access: Stairs to enter Entrance Stairs-Rails: Right;Left Entrance Stairs-Number of Steps: 3 Home Layout: One level Home Equipment: Walker - 2 wheels;Toilet riser      Prior Function Level of Independence: Independent         Comments: family coming to stay with her     Hand Dominance        Extremity/Trunk Assessment   Upper Extremity Assessment Upper Extremity Assessment: Overall WFL for tasks assessed    Lower Extremity Assessment Lower Extremity Assessment: RLE deficits/detail RLE Deficits / Details: SLR 3/5, knee AAROM 5-45* RLE Sensation: WNL RLE Coordination: WNL    Cervical / Trunk Assessment Cervical / Trunk Assessment: Normal  Communication   Communication: No difficulties  Cognition Arousal/Alertness: Lethargic;Suspect due to medications Behavior During Therapy: [facility] for tasks assessed/performed Overall Cognitive Status: Within Functional Limits for tasks assessed  General Comments      Exercises Total Joint Exercises Ankle Circles/Pumps: AROM;Both;10 reps;Supine Quad Sets: AROM;Right;5 reps;Supine Heel Slides: AAROM;Right;10 reps;Supine Goniometric ROM: 5-45* AAROM   Assessment/Plan    PT Assessment Patient needs continued PT services  PT Problem List Decreased activity tolerance;Decreased range of motion;Decreased strength;Decreased mobility;Pain;Decreased knowledge of use of DME       PT Treatment Interventions DME instruction;Gait training;Stair training;Therapeutic exercise;Therapeutic activities;Patient/family education    PT Goals (Current  goals can be found in the Care Plan section)  Acute Rehab PT Goals PT Goal Formulation: Patient unable to participate in goal setting Time For Goal Achievement: 06/14/19 Potential to Achieve Goals: Good    Frequency 7X/week   Barriers to discharge        Co-evaluation               AM-PAC PT "6 Clicks" Mobility  Outcome Measure Help needed turning from your back to your side while in a flat bed without using bedrails?: A Little Help needed moving from lying on your back to sitting on the side of a flat bed without using bedrails?: A Little Help needed moving to and from a bed to a chair (including a wheelchair)?: A Lot Help needed standing up from a chair using your arms (e.g., wheelchair or bedside chair)?: A Lot Help needed to walk in hospital room?: A Lot Help needed climbing 3-5 steps with a railing? : Total 6 Click Score: 13    End of Session   Activity Tolerance: Patient limited by lethargy Patient left: in bed;with call bell/phone within reach;with bed alarm set;with SCD's reapplied Nurse Communication: Mobility status PT Visit Diagnosis: Muscle weakness (generalized) (M62.81);Difficulty in walking, not elsewhere classified (R26.2);Pain Pain - Right/Left: Right Pain - part of body: Knee    Time: 7124-5809 PT Time Calculation (min) (ACUTE ONLY): 23 min   Charges:   PT Evaluation $PT Eval Low Complexity: 1 Low PT Treatments $Therapeutic Activity: 8-22 mins        Blondell Reveal Kistler PT 06/07/2019  Acute Rehabilitation Services Pager [phone] Office [phone]

## 2019-06-07 NOTE — Transfer of Care (Signed)
Immediate Anesthesia Transfer of Care Note  Patient: Isabel Garcia  Procedure(s) Performed: TOTAL KNEE ARTHROPLASTY (Right Knee)  Patient Location: PACU  Anesthesia Type:GA combined with regional for post-op pain  Level of Consciousness: awake and alert   Airway & Oxygen Therapy: Patient Spontanous Breathing and Patient connected to face mask oxygen  Post-op Assessment: Report given to RN and Post -op Vital signs reviewed and stable  Post vital signs: Reviewed and stable  Last Vitals:  Vitals Value Taken Time  BP 135/91 06/07/19 1412  Temp    Pulse 68 06/07/19 1414  Resp 17 06/07/19 1414  SpO2 100 % 06/07/19 1414  Vitals shown include unvalidated device data.  Last Pain:  Vitals:   06/07/19 1052  TempSrc:   PainSc: 5       Patients Stated Pain Goal: 4 (71/21/97 5883)  Complications: No apparent anesthesia complications

## 2019-06-07 NOTE — Plan of Care (Signed)
Plan of care 

## 2019-06-07 NOTE — Anesthesia Procedure Notes (Signed)
Anesthesia Regional Block: Adductor canal block   Pre-Anesthetic Checklist: ,, timeout performed, Correct Patient, Correct Site, Correct Laterality, Correct Procedure, Correct Position, site marked, Risks and benefits discussed,  Surgical consent,  Pre-op evaluation,  At surgeon's request and post-op pain management  Laterality: Right  Prep: Maximum Sterile Barrier Precautions used, chloraprep       Needles:  Injection technique: Single-shot  Needle Type: Echogenic Stimulator Needle     Needle Length: 9cm  Needle Gauge: 22     Additional Needles:   Procedures:,,,, ultrasound used (permanent image in chart),,,,  Narrative:  Start time: 06/07/2019 11:30 AM End time: 06/07/2019 11:35 AM Injection made incrementally with aspirations every 5 mL.  Performed by: Personally  Anesthesiologist: Pervis Hocking, DO  Additional Notes: Monitors applied. No increased pain on injection. No increased resistance to injection. Injection made in 5cc increments. Good needle visualization. Patient tolerated procedure well.

## 2019-06-07 NOTE — Anesthesia Procedure Notes (Signed)
Procedure Name: LMA Insertion Date/Time: 06/07/2019 12:27 PM Performed by: Sharlette Dense, CRNA Patient Re-evaluated:Patient Re-evaluated prior to induction Oxygen Delivery Method: Circle system utilized Preoxygenation: Pre-oxygenation with 100% oxygen Induction Type: IV induction Ventilation: Mask ventilation without difficulty LMA: LMA inserted LMA Size: 4.0 Number of attempts: 1 Placement Confirmation: positive ETCO2 and breath sounds checked- equal and bilateral Tube secured with: Tape Dental Injury: Teeth and Oropharynx as per pre-operative assessment

## 2019-06-07 NOTE — Progress Notes (Signed)
AssistedDr. Criss Rosales with right, ultrasound guided, adductor canal block. Side rails up, monitors on throughout procedure. See vital signs in flow sheet. Tolerated Procedure well.

## 2019-06-07 NOTE — Op Note (Signed)
NAME:  Isabel Garcia                      MEDICAL RECORD NO.:  [id number]                             FACILITY:  [facility]      PHYSICIAN:  Pietro Cassis. Alvan Dame, M.D.  DATE OF BIRTH:  10-Oct-1955      DATE OF PROCEDURE:  06/07/2019                                     OPERATIVE REPORT         PREOPERATIVE DIAGNOSIS:  Right knee osteoarthritis.      POSTOPERATIVE DIAGNOSIS:  Right knee osteoarthritis.      FINDINGS:  The patient was noted to have complete loss of cartilage and   bone-on-bone arthritis with associated osteophytes in the lateral and patellofemoral compartments of   the knee with a significant synovitis and associated effusion.  The patient had failed months of conservative treatment including medications, injection therapy, activity modification.     PROCEDURE:  Right total knee replacement.      COMPONENTS USED:  DePuy Attune rotating platform posterior stabilized knee   system, a size 5N femur, 4 tibia, size 6 mm PS AOX insert, and 38 anatomic patellar   button.      SURGEON:  Pietro Cassis. Alvan Dame, M.D.      ASSISTANT:  Griffith Citron, PA-C.      ANESTHESIA:  General and Regional.      SPECIMENS:  None.      COMPLICATION:  None.      DRAINS:  None.  EBL: <100cc      TOURNIQUET TIME:  29 min at 250 mmHg      The patient was stable to the recovery room.      INDICATION FOR PROCEDURE:  Isabel Garcia is a 64 y.o. female patient of   mine.  The patient had been seen, evaluated, and treated for months conservatively in the   office with medication, activity modification, and injections.  The patient had   radiographic changes of bone-on-bone arthritis with endplate sclerosis and osteophytes noted.  Based on the radiographic changes and failed conservative measures, the patient   decided to proceed with definitive treatment, total knee replacement.  Risks of infection, DVT, component failure, need for revision surgery, neurovascular injury were reviewed in the office setting.  The  postop course was reviewed stressing the efforts to maximize post-operative satisfaction and function.  Consent was obtained for benefit of pain   relief.      PROCEDURE IN DETAIL:  The patient was brought to the operative theater.   Once adequate anesthesia, preoperative antibiotics, 2 gm of Ancef,1 gm of Tranexamic Acid, and 10 mg of Decadron administered, the patient was positioned supine with a right thigh tourniquet placed.  The  right lower extremity was prepped and draped in sterile fashion.  A time-   out was performed identifying the patient, planned procedure, and the appropriate extremity.      The right lower extremity was placed in the [facility] leg holder.  The leg was   exsanguinated, tourniquet elevated to 250 mmHg.  A midline incision was   made followed by median parapatellar arthrotomy.  Following initial   exposure, attention was  first directed to the patella.  Precut   measurement was noted to be 22-23 mm.  I resected down to 13 mm and used a   38 anatomic patellar button to restore patellar height as well as cover the cut surface.      The lug holes were drilled and a metal shim was placed to protect the   patella from retractors and saw blade during the procedure.      At this point, attention was now directed to the femur.  The femoral   canal was opened with a drill, irrigated to try to prevent fat emboli.  An   intramedullary rod was passed at 3 degrees valgus, 9 mm of bone was   resected off the distal femur.  Following this resection, the tibia was   subluxated anteriorly.  Using the extramedullary guide, 4 mm of bone was resected off   the proximal medial tibia.  We confirmed the gap would be   stable medially and laterally with a size 5 spacer block as well as confirmed that the tibial cut was perpendicular in the coronal plane, checking with an alignment rod.      Once this was done, I sized the femur to be a size 5 in the anterior-   posterior dimension, chose  a narrow component based on medial and   lateral dimension.  The size 5 rotation block was then pinned in   position anterior referenced using the C-clamp to set rotation.  The   anterior, posterior, and  chamfer cuts were made without difficulty nor   notching making certain that I was along the anterior cortex to help   with flexion gap stability.      The final box cut was made off the lateral aspect of distal femur.      At this point, the tibia was sized to be a size 4.  The size 4 tray was   then pinned in position through the medial third of the tubercle,   drilled, and keel punched.  Trial reduction was now carried with a 5 femur,  4 tibia, a size 6 mm PS insert, and the 38 anatomic patella botton.  The knee was brought to full extension with good flexion stability with the patella   tracking through the trochlea without application of pressure.  Given   all these findings the trial components removed.  Final components were   opened and cement was mixed.  The knee was irrigated with normal saline solution and pulse lavage.  The synovial lining was   then injected with 30 cc of 0.25% Marcaine with epinephrine, 1 cc of Toradol and 30 cc of NS for a total of 61 cc.     Final implants were then cemented onto cleaned and dried cut surfaces of bone with the knee brought to extension with a size 6 mm PS trial insert.      Once the cement had fully cured, excess cement was removed   throughout the knee.  I confirmed that I was satisfied with the range of   motion and stability, and the final size 6 mm PS AOX insert was chosen.  It was   placed into the knee.      The tourniquet had been let down at 29 minutes.  No significant   hemostasis was required.  The extensor mechanism was then reapproximated using #1 Vicryl and #1 Stratafix sutures with the knee   in flexion.  The  remaining wound was closed with 2-0 Vicryl and running 4-0 Monocryl.   The knee was cleaned, dried, dressed  sterilely using Dermabond and   Aquacel dressing.  The patient was then   brought to recovery room in stable condition, tolerating the procedure   well.   Please note that Physician Assistant, Griffith Citron, PA-C was present for the entirety of the case, and was utilized for pre-operative positioning, peri-operative retractor management, general facilitation of the procedure and for primary wound closure at the end of the case.              Pietro Cassis Alvan Dame, M.D.    06/07/2019 12:22 PM

## 2019-06-07 NOTE — Anesthesia Preprocedure Evaluation (Addendum)
Anesthesia Evaluation  Patient identified by MRN, date of birth, ID band Patient awake    Reviewed: Allergy & Precautions, NPO status , Patient's Chart, lab work & pertinent test results  Airway Mallampati: III  TM Distance: >3 FB Neck ROM: Full    Dental no notable dental hx. 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 on medications + DVT  Normal cardiovascular exam Rhythm:Regular Rate:Normal  Last echo 2019: - Left ventricle: The cavity size was normal. Wall thickness was  normal. Systolic function was vigorous. The estimated ejection  fraction was in the range of 65% to 70%. Wall motion was normal;  there were no regional wall motion abnormalities. Doppler  parameters are consistent with abnormal left ventricular  relaxation (grade 1 diastolic dysfunction).  - Aortic valve: Trileaflet; mildly thickened, mildly calcified  leaflets.  - Tricuspid valve: There was trivial regurgitation.   Neuro/Psych negative neurological ROS  negative psych ROS   GI/Hepatic GERD  Controlled and Medicated,(+) neg Cirrhosis      , NASH Diverticulosis    Endo/Other  Obesity BMI 36  Renal/GU negative Renal ROS  negative genitourinary   Musculoskeletal  (+) Arthritis , Osteoarthritis,  Chronic LBP   Abdominal (+) + obese,   Peds negative pediatric ROS (+)  Hematology negative hematology ROS (+) hct 37.4, plt 295   Anesthesia Other Findings   Reproductive/Obstetrics negative OB ROS                           Anesthesia Physical Anesthesia Plan  ASA: III  Anesthesia Plan: Spinal, MAC and Regional   Post-op Pain Management:  Regional for Post-op pain    Induction:   PONV Risk Score and Plan: 2 and Propofol infusion and TIVA  Airway Management Planned: Natural Airway and Nasal Cannula  Additional Equipment: None  Intra-op Plan:   Post-operative Plan:   Informed Consent: I have reviewed the patients History and Physical, chart, labs and discussed the procedure including the risks, benefits and alternatives for the proposed anesthesia with the patient or authorized representative who has indicated his/her understanding and acceptance.       Plan Discussed with: CRNA  Anesthesia Plan Comments:         Anesthesia Quick Evaluation

## 2019-06-07 NOTE — Discharge Instructions (Addendum)
INSTRUCTIONS AFTER JOINT REPLACEMENT   o Remove items at home which could result in a fall. This includes throw rugs or furniture in walking pathways o ICE to the affected joint every three hours while awake for 30 minutes at a time, for at least the first 3-5 days, and then as needed for pain and swelling.  Continue to use ice for pain and swelling. You may notice swelling that will progress down to the foot and ankle.  This is normal after surgery.  Elevate your leg when you are not up walking on it.   o Continue to use the breathing machine you got in the hospital (incentive spirometer) which will help keep your temperature down.  It is common for your temperature to cycle up and down following surgery, especially at night when you are not up moving around and exerting yourself.  The breathing machine keeps your lungs expanded and your temperature down.   DIET:  As you were doing prior to hospitalization, we recommend a well-balanced diet.  DRESSING / WOUND CARE / SHOWERING  Keep the surgical dressing until follow up.  The dressing is water proof, so you can shower without any extra covering.  IF THE DRESSING FALLS OFF or the wound gets wet inside, change the dressing with sterile gauze.  Please use good hand washing techniques before changing the dressing.  Do not use any lotions or creams on the incision until instructed by your surgeon.    ACTIVITY  o Increase activity slowly as tolerated, but follow the weight bearing instructions below.   o No driving for 6 weeks or until further direction given by your physician.  You cannot drive while taking narcotics.  o No lifting or carrying greater than 10 lbs. until further directed by your surgeon. o Avoid periods of inactivity such as sitting longer than an hour when not asleep. This helps prevent blood clots.  o You may return to work once you are authorized by your doctor.     WEIGHT BEARING   Weight bearing as tolerated with assist  device (walker, cane, etc) as directed, use it as long as suggested by your surgeon or therapist, typically at least 4-6 weeks.   EXERCISES  Results after joint replacement surgery are often greatly improved when you follow the exercise, range of motion and muscle strengthening exercises prescribed by your doctor. Safety measures are also important to protect the joint from further injury. Any time any of these exercises cause you to have increased pain or swelling, decrease what you are doing until you are comfortable again and then slowly increase them. If you have problems or questions, call your caregiver or physical therapist for advice.   Rehabilitation is important following a joint replacement. After just a few days of immobilization, the muscles of the leg can become weakened and shrink (atrophy).  These exercises are designed to build up the tone and strength of the thigh and leg muscles and to improve motion. Often times heat used for twenty to thirty minutes before working out will loosen up your tissues and help with improving the range of motion but do not use heat for the first two weeks following surgery (sometimes heat can increase post-operative swelling).   These exercises can be done on a training (exercise) mat, on the floor, on a table or on a bed. Use whatever works the best and is most comfortable for you.    Use music or television while you are exercising so that  the exercises are a pleasant break in your day. This will make your life better with the exercises acting as a break in your routine that you can look forward to.   Perform all exercises about fifteen times, three times per day or as directed.  You should exercise both the operative leg and the other leg as well.  Exercises include:   . Quad Sets - Tighten up the muscle on the front of the thigh (Quad) and hold for 5-10 seconds.   . Straight Leg Raises - With your knee straight (if you were given a brace, keep it on),  lift the leg to 60 degrees, hold for 3 seconds, and slowly lower the leg.  Perform this exercise against resistance later as your leg gets stronger.  . Leg Slides: Lying on your back, slowly slide your foot toward your buttocks, bending your knee up off the floor (only go as far as is comfortable). Then slowly slide your foot back down until your leg is flat on the floor again.  Glenard Haring Wings: Lying on your back spread your legs to the side as far apart as you can without causing discomfort.  . Hamstring Strength:  Lying on your back, push your heel against the floor with your leg straight by tightening up the muscles of your buttocks.  Repeat, but this time bend your knee to a comfortable angle, and push your heel against the floor.  You may put a pillow under the heel to make it more comfortable if necessary.   A rehabilitation program following joint replacement surgery can speed recovery and prevent re-injury in the future due to weakened muscles. Contact your doctor or a physical therapist for more information on knee rehabilitation.    CONSTIPATION  Constipation is defined medically as fewer than three stools per week and severe constipation as less than one stool per week.  Even if you have a regular bowel pattern at home, your normal regimen is likely to be disrupted due to multiple reasons following surgery.  Combination of anesthesia, postoperative narcotics, change in appetite and fluid intake all can affect your bowels.   YOU MUST use at least one of the following options; they are listed in order of increasing strength to get the job done.  They are all available over the counter, and you may need to use some, POSSIBLY even all of these options:    Drink plenty of fluids (prune juice may be helpful) and high fiber foods Colace 100 mg by mouth twice a day  Senokot for constipation as directed and as needed Dulcolax (bisacodyl), take with full glass of water  Miralax (polyethylene glycol)  once or twice a day as needed.  If you have tried all these things and are unable to have a bowel movement in the first 3-4 days after surgery call either your surgeon or your primary doctor.    If you experience loose stools or diarrhea, hold the medications until you stool forms back up.  If your symptoms do not get better within 1 week or if they get worse, check with your doctor.  If you experience "the worst abdominal pain ever" or develop nausea or vomiting, please contact the office immediately for further recommendations for treatment.   ITCHING:  If you experience itching with your medications, try taking only a single pain pill, or even half a pain pill at a time.  You can also use Benadryl over the counter for itching or also to  help with sleep.   TED HOSE STOCKINGS:  Use stockings on both legs until for at least 2 weeks or as directed by physician office. They may be removed at night for sleeping.  MEDICATIONS:  See your medication summary on the "After Visit Summary" that nursing will review with you.  You may have some home medications which will be placed on hold until you complete the course of blood thinner medication.  It is important for you to complete the blood thinner medication as prescribed.  PRECAUTIONS:  If you experience chest pain or shortness of breath - call 911 immediately for transfer to the hospital emergency department.   If you develop a fever greater that 101 F, purulent drainage from wound, increased redness or drainage from wound, foul odor from the wound/dressing, or calf pain - CONTACT YOUR SURGEON.                                                   FOLLOW-UP APPOINTMENTS:  If you do not already have a post-op appointment, please call the office for an appointment to be seen by your surgeon.  Guidelines for how soon to be seen are listed in your "After Visit Summary", but are typically between 1-4 weeks after surgery.  OTHER INSTRUCTIONS:   Knee  Replacement:  Do not place pillow under knee, focus on keeping the knee straight while resting.    DENTAL ANTIBIOTICS:  In most cases prophylactic antibiotics for Dental procdeures after total joint surgery are not necessary.  Exceptions are as follows:  1. History of prior total joint infection  2. Severely immunocompromised (Organ Transplant, cancer chemotherapy, Rheumatoid biologic meds such as [place])  3. Poorly controlled diabetes (A1C &gt; 8.0, blood glucose over 200)  If you have one of these conditions, contact your surgeon for an antibiotic prescription, prior to your dental procedure.   MAKE SURE YOU:  . Understand these instructions.  . Get help right away if you are not doing well or get worse.    Thank you for letting Isabel Garcia be a part of your medical care team.  It is a privilege we respect greatly.  We hope these instructions will help you stay on track for a fast and full recovery!   Information on my medicine - ELIQUIS (apixaban)  This medication education was reviewed with me or my healthcare representative as part of my discharge preparation.  The pharmacist that spoke with me during my hospital stay was:  Why was Eliquis prescribed for you? Eliquis was prescribed for you to reduce the risk of blood clots forming after orthopedic surgery.    What do You need to know about Eliquis? Take your Eliquis TWICE DAILY - one tablet in the morning and one tablet in the evening with or without food.  It would be best to take the dose about the same time each day.  If you have difficulty swallowing the tablet whole please discuss with your pharmacist how to take the medication safely.  Take Eliquis exactly as prescribed by your doctor and DO NOT stop taking Eliquis without talking to the doctor who prescribed the medication.  Stopping without other medication to take the place of Eliquis may increase your risk of developing a clot.  After discharge, you should have  regular check-up appointments with your healthcare provider that is prescribing  your Eliquis.  What do you do if you miss a dose? If a dose of ELIQUIS is not taken at the scheduled time, take it as soon as possible on the same day and twice-daily administration should be resumed.  The dose should not be doubled to make up for a missed dose.  Do not take more than one tablet of ELIQUIS at the same time.  Important Safety Information A possible side effect of Eliquis is bleeding. You should call your healthcare provider right away if you experience any of the following: ? Bleeding from an injury or your nose that does not stop. ? Unusual colored urine (red or dark brown) or unusual colored stools (red or black). ? Unusual bruising for unknown reasons. ? A serious fall or if you hit your head (even if there is no bleeding).  Some medicines may interact with Eliquis and might increase your risk of bleeding or clotting while on Eliquis. To help avoid this, consult your healthcare provider or pharmacist prior to using any new prescription or non-prescription medications, including herbals, vitamins, non-steroidal anti-inflammatory drugs (NSAIDs) and supplements.  This website has more information on Eliquis (apixaban): http://www.eliquis.com/eliquis/home

## 2019-06-08 DIAGNOSIS — M1711 Unilateral primary osteoarthritis, right knee: Secondary | ICD-10-CM | POA: Diagnosis not present

## 2019-06-08 LAB — CBC
HCT: 33.8 % — ABNORMAL LOW (ref 36.0–46.0)
Hemoglobin: 11.2 g/dL — ABNORMAL LOW (ref 12.0–15.0)
MCH: 30.8 pg (ref 26.0–34.0)
MCHC: 33.1 g/dL (ref 30.0–36.0)
MCV: 92.9 fL (ref 80.0–100.0)
Platelets: 259 10*3/uL (ref 150–400)
RBC: 3.64 MIL/uL — ABNORMAL LOW (ref 3.87–5.11)
RDW: 13.2 % (ref 11.5–15.5)
WBC: 8.8 10*3/uL (ref 4.0–10.5)
nRBC: 0 % (ref 0.0–0.2)

## 2019-06-08 LAB — BASIC METABOLIC PANEL
Anion gap: 9 (ref 5–15)
BUN: 11 mg/dL (ref 8–23)
CO2: 26 mmol/L (ref 22–32)
Calcium: 8.8 mg/dL — ABNORMAL LOW (ref 8.9–10.3)
Chloride: 107 mmol/L (ref 98–111)
Creatinine, Ser: 0.66 mg/dL (ref 0.44–1.00)
GFR calc Af Amer: >60 mL/min (ref >60)
GFR calc non Af Amer: >60 mL/min (ref >60)
Glucose, Bld: 145 mg/dL — ABNORMAL HIGH (ref 70–99)
Potassium: 3.9 mmol/L (ref 3.5–5.1)
Sodium: 142 mmol/L (ref 135–145)

## 2019-06-08 MED ORDER — POLYETHYLENE GLYCOL 3350 17 G PO PACK
17.0000 g | PACK | Freq: Two times a day (BID) | ORAL | 0 refills | Status: DC
Start: 2019-06-08 — End: 2019-08-11

## 2019-06-08 MED ORDER — ASPIRIN 81 MG PO CHEW
81.0000 mg | CHEWABLE_TABLET | Freq: Two times a day (BID) | ORAL | 0 refills | Status: DC
Start: 2019-06-08 — End: 2019-06-08

## 2019-06-08 MED ORDER — FERROUS SULFATE 325 (65 FE) MG PO TABS: 325.0000 mg | ORAL_TABLET | Freq: Three times a day (TID) | ORAL | 0 refills | Status: DC

## 2019-06-08 MED ORDER — METHOCARBAMOL 500 MG PO TABS: 500.0000 mg | ORAL_TABLET | Freq: Four times a day (QID) | ORAL | 0 refills | Status: DC | PRN

## 2019-06-08 MED ORDER — DOCUSATE SODIUM 100 MG PO CAPS
100.0000 mg | ORAL_CAPSULE | Freq: Two times a day (BID) | ORAL | 0 refills | Status: DC
Start: 2019-06-08 — End: 2019-08-11

## 2019-06-08 MED ORDER — HYDROCODONE-ACETAMINOPHEN 5-325 MG PO TABS: 1.0000 | ORAL_TABLET | ORAL | 0 refills | Status: DC | PRN

## 2019-06-08 NOTE — Progress Notes (Signed)
     Subjective: 1 Day Post-Op Procedure(s) (LRB): TOTAL KNEE ARTHROPLASTY (Right)   Patient reports pain as mild, pain controlled. No reported events throughout the night.  Discussed the procedure and expectations moving forward.  Ready to be discharged home, if she does well with PT. Follow up in the clinic in 2 weeks.  She knows to call with any questions or concerns.    Patient's anticipated LOS is less than 2 midnights, meeting these requirements: - Younger than 73 - Lives within 1 hour of care - Has a competent adult at home to recover with post-op recover - NO history of  - Chronic pain requiring opiods  - Diabetes  - Coronary Artery Disease  - Heart failure  - Heart attack  - Stroke  - DVT/VTE  - Cardiac arrhythmia  - Respiratory Failure/COPD  - Renal failure  - Anemia  - Advanced Liver disease    Objective:   VITALS:   Vitals:   06/08/19 0611 06/08/19 0747  BP: 104/70   Pulse: (!) 58   Resp: 14   Temp: 97.9 F (36.6 C)   SpO2: 100% 95%    Dorsiflexion/Plantar flexion intact Incision: dressing C/D/I No cellulitis present Compartment soft  LABS Recent Labs    06/08/19 0246  HGB 11.2*  HCT 33.8*  WBC 8.8  PLT 259    Recent Labs    06/08/19 0246  NA 142  K 3.9  BUN 11  CREATININE 0.66  GLUCOSE 145*     Assessment/Plan: 1 Day Post-Op Procedure(s) (LRB): TOTAL KNEE ARTHROPLASTY (Right) Foley cath d/c'ed Advance diet Up with therapy D/C IV fluids Discharge home Follow up in 2 weeks at [facility] Follow up with OLIN,Harutyun Monteverde D in 2 weeks.  Contact information:  [facility] [address], [address] [phone]      Obese (BMI 30-39.9) Estimated body mass index is 36.32 kg/m as calculated from the following:   Height as of this encounter: 5' 5"  (1.651 m).   Weight as of this encounter: 99 kg. Patient also counseled that weight may inhibit the healing process Patient counseled that losing  weight will help with future health issues      Danae Orleans PA-C  [facility]  [place] [address], [place], [place] [address] Phone: [phone] [email or web address]  [employer]

## 2019-06-08 NOTE — Progress Notes (Signed)
Physical Therapy Treatment Patient Details Name: Isabel Garcia MRN: [id number] DOB: [birth year] Today's Date: 06/08/2019    History of Present Illness R TKA    PT Comments    Pt is ready to DC home from PT standpoint. Stair training completed.   Follow Up Recommendations  Follow surgeon's recommendation for DC plan and follow-up therapies     Equipment Recommendations  None recommended by PT    Recommendations for Other Services       Precautions / Restrictions Precautions Precautions: Knee Precaution Booklet Issued: Yes (comment) Precaution Comments: reviewed no pillow under knee Restrictions Weight Bearing Restrictions: No Other Position/Activity Restrictions: WBAT    Mobility  Bed Mobility Overal bed mobility: Modified Independent Bed Mobility: Sit to Supine     Supine to sit: Modified independent (Device/Increase time) Sit to supine: Modified independent (Device/Increase time)   General bed mobility comments: self assisted RLE with LLE  Transfers Overall transfer level: Needs assistance Equipment used: Rolling walker (2 wheeled) Transfers: Sit to/from Stand Sit to Stand: Supervision         General transfer comment: VCs hand placement  Ambulation/Gait Ambulation/Gait assistance: Supervision Gait Distance (Feet): 130 Feet Assistive device: Rolling walker (2 wheeled) Gait Pattern/deviations: Step-to pattern;Decreased stride length Gait velocity: decr   General Gait Details: VCs sequencing, no loss of balance   Stairs Stairs: Yes Stairs assistance: Min assist;Min guard Stair Management: One rail Right;With cane;Forwards;Step to pattern Number of Stairs: 5 General stair comments: 3 + 2 stairs, VCs sequencing, mild buckling of RLE x 2 but pt able to self correct, no loss of balance   Wheelchair Mobility    Modified Rankin (Stroke Patients Only)       Balance Overall balance assessment: Needs assistance Sitting-balance support: Feet  supported;No upper extremity supported Sitting balance-Leahy Scale: Good     Standing balance support: Bilateral upper extremity supported Standing balance-Leahy Scale: Fair                              Cognition Arousal/Alertness: Awake/alert Behavior During Therapy: WFL for tasks assessed/performed Overall Cognitive Status: Within Functional Limits for tasks assessed                                           General Comments        Pertinent Vitals/Pain Pain Score: 5  Pain Location: L knee Pain Descriptors / Indicators: Sore Pain Intervention(s): Limited activity within patient's tolerance;Monitored during session;Premedicated before session;Patient requesting pain meds-RN notified;Ice applied    Home Living                      Prior Function            PT Goals (current goals can now be found in the care plan section) Acute Rehab PT Goals PT Goal Formulation: With patient Time For Goal Achievement: 06/14/19 Potential to Achieve Goals: Good Progress towards PT goals: Progressing toward goals    Frequency    7X/week      PT Plan Current plan remains appropriate    Co-evaluation              AM-PAC PT "6 Clicks" Mobility   Outcome Measure  Help needed turning from your back to your side while in a flat bed without using bedrails?: A Little Help  needed moving from lying on your back to sitting on the side of a flat bed without using bedrails?: None Help needed moving to and from a bed to a chair (including a wheelchair)?: None Help needed standing up from a chair using your arms (e.g., wheelchair or bedside chair)?: None Help needed to walk in hospital room?: None Help needed climbing 3-5 steps with a railing? : A Little 6 Click Score: 22    End of Session Equipment Utilized During Treatment: Gait belt Activity Tolerance: Patient tolerated treatment well Patient left: with call bell/phone within reach;in  bed Nurse Communication: Mobility status PT Visit Diagnosis: Muscle weakness (generalized) (M62.81);Difficulty in walking, not elsewhere classified (R26.2);Pain Pain - Right/Left: Right Pain - part of body: Knee     Time: 1235-1310 PT Time Calculation (min) (ACUTE ONLY): 35 min  Charges:  $Gait Training: 8-22 mins  $Therapeutic Activity: 8-22 mins                     Blondell Reveal Kistler PT 06/08/2019  Acute Rehabilitation Services Pager [phone] Office [phone]

## 2019-06-08 NOTE — TOC Progression Note (Addendum)
Transition of Care [facility]) - Progression Note    Patient Details  Name: Isabel Garcia MRN: [id number] Date of Birth: 1956-01-04  Transition of [facility]) CM/SW [place], [place] Phone Number: 06/08/2019, 10:24 AM  Clinical Narrative:    Therapy Plan: OPPT   Has DME  Patient express concern with needing additional support at home for her elderly mother. CSW suggested patient reach out to private care duty agencies in her area, with the understanding medicare will not pay for private care. Patient appreciative of the information.      Barriers to Discharge: No Barriers Identified  Expected Discharge Plan and Services           Expected Discharge Date: 06/08/19               DME Arranged: N/A(Has RW and Hightoilet seat with grab bars.) DME Agency: NA       HH Arranged: NA HH Agency: NA         Social Determinants of Health (SDOH) Interventions    Readmission Risk Interventions No flowsheet data found.

## 2019-06-08 NOTE — Progress Notes (Signed)
Physical Therapy Treatment Patient Details Name: Isabel Garcia MRN: [id number] DOB: 12-Mar-1955 Today's Date: 06/08/2019    History of Present Illness R TKA    PT Comments    Pt is progressing well with mobility, she ambulated 94' with RW, no loss of balance. Instructed pt in TKA HEP, she demonstrates good understanding. Will return for second session for stair training, then expect she will be ready to DC home from PT standpoint.    Follow Up Recommendations  Follow surgeon's recommendation for DC plan and follow-up therapies     Equipment Recommendations  None recommended by PT    Recommendations for Other Services       Precautions / Restrictions Precautions Precautions: Knee Precaution Booklet Issued: Yes (comment) Precaution Comments: reviewed no pillow under knee Restrictions Weight Bearing Restrictions: No Other Position/Activity Restrictions: WBAT    Mobility  Bed Mobility Overal bed mobility: Modified Independent Bed Mobility: Supine to Sit     Supine to sit: Modified independent (Device/Increase time)        Transfers Overall transfer level: Needs assistance Equipment used: Rolling walker (2 wheeled) Transfers: Sit to/from Stand Sit to Stand: Min guard         General transfer comment: VCs hand placement  Ambulation/Gait Ambulation/Gait assistance: Min guard Gait Distance (Feet): 75 Feet Assistive device: Rolling walker (2 wheeled) Gait Pattern/deviations: Step-to pattern;Decreased stride length Gait velocity: decr   General Gait Details: VCs sequencing, no loss of balance   Stairs             Wheelchair Mobility    Modified Rankin (Stroke Patients Only)       Balance Overall balance assessment: Needs assistance Sitting-balance support: Feet supported;No upper extremity supported Sitting balance-Leahy Scale: Good     Standing balance support: Bilateral upper extremity supported Standing balance-Leahy Scale: Fair                              Cognition Arousal/Alertness: Awake/alert Behavior During Therapy: WFL for tasks assessed/performed Overall Cognitive Status: Within Functional Limits for tasks assessed                                        Exercises Total Joint Exercises Ankle Circles/Pumps: AROM;Both;10 reps;Supine Quad Sets: AROM;Right;5 reps;Supine Short Arc Quad: AROM;Right;10 reps;Supine Heel Slides: AAROM;Right;10 reps;Supine Hip ABduction/ADduction: AAROM;Right;10 reps;Supine Straight Leg Raises: AROM;Right;5 reps;Supine Long Arc Quad: (attempted 1 rep but very painful so stopped) Knee Flexion: AAROM;Right;10 reps;Seated Goniometric ROM: 10-65* AAROM R knee    General Comments        Pertinent Vitals/Pain Pain Score: 5  Pain Location: R knee Pain Descriptors / Indicators: Sore Pain Intervention(s): Limited activity within patient's tolerance;Monitored during session;Premedicated before session;Ice applied    Home Living                      Prior Function            PT Goals (current goals can now be found in the care plan section) Acute Rehab PT Goals PT Goal Formulation: With patient Time For Goal Achievement: 06/14/19 Potential to Achieve Goals: Good Progress towards PT goals: Progressing toward goals    Frequency    7X/week      PT Plan Current plan remains appropriate    Co-evaluation  AM-PAC PT "6 Clicks" Mobility   Outcome Measure  Help needed turning from your back to your side while in a flat bed without using bedrails?: A Little Help needed moving from lying on your back to sitting on the side of a flat bed without using bedrails?: A Little Help needed moving to and from a bed to a chair (including a wheelchair)?: A Little Help needed standing up from a chair using your arms (e.g., wheelchair or bedside chair)?: A Little Help needed to walk in hospital room?: A Little Help needed climbing 3-5 steps  with a railing? : A Lot 6 Click Score: 17    End of Session Equipment Utilized During Treatment: Gait belt Activity Tolerance: Patient tolerated treatment well Patient left: with call bell/phone within reach;in chair;with chair alarm set Nurse Communication: Mobility status PT Visit Diagnosis: Muscle weakness (generalized) (M62.81);Difficulty in walking, not elsewhere classified (R26.2);Pain Pain - Right/Left: Right Pain - part of body: Knee     Time: 6580-0634 PT Time Calculation (min) (ACUTE ONLY): 41 min  Charges:  $Gait Training: 8-22 mins $Therapeutic Exercise: 8-22 mins $Therapeutic Activity: 8-22 mins                     Blondell Reveal Kistler PT 06/08/2019  Acute Rehabilitation Services Pager [phone] Office [phone]

## 2019-06-09 ENCOUNTER — Encounter: Payer: Self-pay | Admitting: Gastroenterology

## 2019-06-10 ENCOUNTER — Encounter: Payer: Self-pay | Admitting: *Deleted

## 2019-06-13 ENCOUNTER — Ambulatory Visit (HOSPITAL_COMMUNITY): Payer: 59 | Attending: Orthopedic Surgery | Admitting: Physical Therapy

## 2019-06-13 ENCOUNTER — Other Ambulatory Visit: Payer: Self-pay

## 2019-06-13 DIAGNOSIS — R262 Difficulty in walking, not elsewhere classified: Secondary | ICD-10-CM | POA: Diagnosis present

## 2019-06-13 DIAGNOSIS — G8929 Other chronic pain: Secondary | ICD-10-CM | POA: Insufficient documentation

## 2019-06-13 DIAGNOSIS — M6281 Muscle weakness (generalized): Secondary | ICD-10-CM | POA: Diagnosis present

## 2019-06-13 DIAGNOSIS — M25561 Pain in right knee: Secondary | ICD-10-CM | POA: Insufficient documentation

## 2019-06-13 NOTE — Therapy (Signed)
[facility] [place], [place], [address] Phone: [phone]   Fax:  [phone]  Physical Therapy Evaluation  Patient Details  Name: Isabel Garcia MRN: [id number] Date of Birth: 1955-11-10 Referring Provider (PT): Danae Orleans, [place]   Encounter Date: 06/13/2019  PT End of Session - 06/13/19 1256    Visit Number  1    Number of Visits  19    Date for PT Re-Evaluation  08/08/19   eval 06/13/19   Authorization Type  [employer]; no auth required    Authorization - Visit Number  2    Authorization - Number of Visits  30    Progress Note Due on Visit  10    PT Start Time  1000    PT Stop Time  1045    PT Time Calculation (min)  45 min    Activity Tolerance  Patient tolerated treatment well;Patient limited by pain;Patient limited by fatigue    Behavior During Therapy  [facility] for tasks assessed/performed       Past Medical History:  Diagnosis Date  . Achilles tendinitis   . Arthritis   . Asthma   . Diverticulitis   . Diverticulosis   . GERD (gastroesophageal reflux disease)   . Heart murmur    as a child  . History of blood clots   . History of chicken pox   . History of pulmonary embolus (PE) 2018  . Hypertension   . Liver cirrhosis secondary to NASH (nonalcoholic steatohepatitis) ([place])   . Osteoarthritis   . Phlebitis   . Plantar fasciitis   . Vertigo     Past Surgical History:  Procedure Laterality Date  . ABDOMINAL HYSTERECTOMY    . bone spur removal    . KNEE ARTHROSCOPY Right   . KNEE ARTHROSCOPY Right   . 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   Procedure: TOTAL KNEE ARTHROPLASTY;  Surgeon: Paralee Cancel, MD;  Location: [facility];  Service: Orthopedics;  Laterality: Right;  70 mins    There were no vitals filed for this visit.   Subjective Assessment - 06/13/19 1008    Subjective  Patient complains of right knee pain following right TKA on 06/07/19. States she had knee pain for years and in December of  last year her pain got real bad and she ahd to stop walking recreationally. States since the surgery sleeping and picking right leg up is very challenging. States she is trying to get up and walk around every hour. She has family at home that encourages her to get up and move around. States she has had knee pain for about 5 years, reports she had multiple injections which in knee. Had one knee scope in 1995 and then had an injection of fluid. She has had a DVT in both legs - 2019.    Pertinent History  hx of B DVT    Currently in Pain?  Yes    Pain Score  5     Pain Location  Knee    Pain Orientation  Right    Pain Descriptors / Indicators  Aching;Sharp;Dull    Pain Type  Surgical pain         [facility] PT Assessment - 06/13/19 0001      Assessment   Medical Diagnosis  R TKA    Referring Provider (PT)  Danae Orleans, PA    Onset Date/Surgical Date  06/07/19    Prior Therapy  Yes, for feet and  ankles and back      Precautions   Precautions  None      Restrictions   Weight Bearing Restrictions  No      Balance Screen   Has the patient fallen in the past 6 months  No    Has the patient had a decrease in activity level because of a fear of falling?   Yes    Is the patient reluctant to leave their home because of a fear of falling?   No      Home Social worker  Private residence    Living Arrangements  Parent    Available Help at Discharge  Family;Friend(s)    Type of [place] to enter    Entrance Stairs-Number of Steps  4    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 mild redness noted along medial aspect of bandage.  swelling noted throughout right leg, bruising along groin    Focus on Therapeutic Outcomes (FOTO)   93% limited      AROM   AROM Assessment Site  Knee    Right/Left Knee  Right;Left    Right Knee Extension  18   lacking   Right Knee Flexion  58   with PT assist and strap - painful   Left Knee Extension  0    Left Knee Flexion  130      Strength   Overall Strength Comments  poor quad isometric contraction       Palpation   Palpation comment  tenderness to palpation surrounding right knee      Transfers   Transfers  Sit to Stand;Stand to Sit;Supine to Sit;Sit to Supine    Sit to Stand  With upper extremity assist;6: Modified independent (Device/Increase time)   right leg kicked out   Stand to Sit  6: Modified independent (Device/Increase time);With upper extremity assist   right leg kicked out   Supine to Sit  With upper extremity assist;4: Min guard   right leg kicked out with assist from Left leg   Sit to Supine  Without upper extremity assist;5: Supervision   right leg kicked out with assist from Left leg     Ambulation/Gait   Ambulation/Gait  Yes    Ambulation/Gait Assistance  6: Modified independent (Device/Increase time);5: Supervision    Ambulation Distance (Feet)  50 Feet    Assistive device  Rolling walker    Gait Pattern  Step-to pattern;Decreased stance time - right;Decreased hip/knee flexion - right;Decreased dorsiflexion - right;Decreased weight shift to right;Trunk flexed;Wide base of support    Ambulation Surface  Level;Indoor    Gait velocity  decreased                Objective measurements completed on examination: See above findings.      [facility] PT Treatment/Exercise - 06/13/19 0001  Exercises   Exercises  Knee/Hip      Knee/Hip Exercises: Seated   Other Seated Knee/Hip Exercises  knee  AAROM flexion x10 5" holds R     Other Seated Knee/Hip Exercises   quad sets x10 5" holds - tactile cues with DF       Knee/Hip Exercises: Supine   Quad Sets  AROM;Right;1 set;10 reps;Left   5" holds with DF and bialteral muscle activation    Heel Slides  AAROM;Right;1 set;10 reps   with strap and 10" holds.            PT Education - 06/13/19 1110    Education Details  Educated patient in signs and symptoms of DVT, risks of continued swelling, benefits of full thigh compression stockings, importance of knee ROM and walking and elevating above heart.    Person(s) Educated  Patient    Methods  Explanation    Comprehension  Verbalized understanding       PT Short Term Goals - 06/13/19 1102      PT SHORT TERM GOAL #1   Title  Patient will demonstrate at least 10-90 degrees of right knee ROM to demonstrate improved knee mobility    Time  4    Period  Weeks    Status  New    Target Date  07/11/19      PT SHORT TERM GOAL #2   Title  Patient will report at least 25% improvement in overall symptoms and/or functional ability.    Time  4    Period  Weeks    Status  New    Target Date  07/11/19      PT SHORT TERM GOAL #3   Title  Patient will be independent in self management strategies to improve quality of life and functional outcomes.    Time  4    Period  Weeks    Status  New    Target Date  07/11/19        PT Long Term Goals - 06/13/19 1103      PT LONG TERM GOAL #1   Title  Patient will be able to ascend and descend stairs with reciprocal gait pattern and use of railing as needed to improve ability to ascend and descend steps at home.    Time  8    Period  Weeks    Status  New    Target Date  08/08/19      PT LONG TERM GOAL #2   Title  Patient will demonstrate 2-120 degrees of right knee ROM to demosntrate improved knee mobility    Time  8    Period  Weeks    Status  New    Target Date  08/08/19      PT LONG TERM GOAL #3   Title  Patient will report at  least 50% improvement in overall symptoms and/or functional ability.    Time  8    Period  Weeks    Status  New    Target Date  08/08/19      PT LONG TERM GOAL #4   Title  Patient will be able to ambulate 2 minutes without major gait deviations and without assistive device to return to prior level of function    Time  8    Period  Weeks    Status  New    Target Date  08/08/19             Plan -  06/13/19 1106    Clinical Impression Statement  Patient is s/p right TKA on 06/07/19. She presents with poor quad activation and knee ROM on this date. Focused on educating patient on HEP to perform at home as well as signs and symptoms of DVT secondary to history of DVT. Patient verbalized understanding. Improved walking noted after ROM exercises. Pain limited interventions on this date. Patient would greatly benefit from skilled physical therapy to improve post operative rehabilitation outcomes and function.    Personal Factors and Comorbidities  Time since onset of injury/illness/exacerbation;Comorbidity 2;Fitness    Comorbidities  Hx of DVT, HTN    Examination-Activity Limitations  Stand;Locomotion Level;Transfers;Squat;Stairs;Bed Mobility;Bathing;Sit;Sleep;Caring for Others;Lift;Hygiene/Grooming;Dressing;Toileting    Examination-Participation Restrictions  Driving;Community Activity;Cleaning;Shop;Meal Prep;Laundry    Stability/Clinical Decision Making  Stable/Uncomplicated    Clinical Decision Making  Low    Rehab Potential  Good    PT Frequency  --   3x/week for 3 weeks then 2x/week for 5 weeks   PT Duration  8 weeks    PT Treatment/Interventions  ADLs/Self Care Home Management;Aquatic Therapy;Cryotherapy;Electrical Stimulation;Moist Heat;DME Instruction;Gait training;Stair training;Functional mobility training;Therapeutic activities;Therapeutic exercise;Balance training;Neuromuscular re-education;Patient/family education;Orthotic Fit/Training;Manual techniques;Passive range of motion;Dry  needling;Energy conservation;Splinting;Taping;Scar mobilization    PT Next Visit Plan  quad activation, knee ROM, gait training    PT Home Exercise Plan  5/10 heel slides, knee AAROM seated flexion, quad sets    Consulted and Agree with Plan of Care  Patient       Patient will benefit from skilled therapeutic intervention in order to improve the following deficits and impairments:  Abnormal gait, Increased fascial restricitons, Improper body mechanics, Pain, Decreased mobility, Decreased activity tolerance, Decreased endurance, Decreased range of motion, Decreased strength, Hypomobility, Difficulty walking, Decreased knowledge of precautions, Decreased knowledge of use of DME, Decreased scar mobility, Increased edema, Decreased skin integrity, Decreased balance  Visit Diagnosis: Chronic pain of right knee  Muscle weakness (generalized)  Difficulty in walking, not elsewhere classified     Problem List Patient Active Problem List   Diagnosis Date Noted  . S/P right TKA 06/07/2019  . History of DVT (deep vein thrombosis) 04/26/2018  . Current use of long term anticoagulation 04/26/2018  . 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 Chronic pain of right knee 10/22/2017  . Right foot pain 10/22/2017  . Neck pain 10/22/2017  . Obesity (BMI 35.0-39.9 without comorbidity) 10/22/2017  . Healthcare maintenance 10/20/2017  . History of pulmonary embolus (PE) 10/20/2017  . GERD (gastroesophageal reflux disease) 09/08/2017  . Cough 08/11/2017  . Essential hypertension 08/11/2017  . Obstructive sleep apnea 08/11/2017   1:01 PM, 06/13/19 Jerene Pitch, DPT Physical Therapy with [facility]  [phone] office  [place] [address] [place], [place], [address] Phone: [phone]   Fax:  [phone]  Name: Isabel Garcia MRN: [id number] Date of Birth: 10-18-1955

## 2019-06-13 NOTE — Patient Instructions (Signed)
Access Code: [email or web address] URL: [email or web address] Date: 06/13/2019 Prepared by: Yetta Glassman  Exercises Seated Quad Set - 2 sets - 10 reps - 5 hold Long Sitting Quad Set - 2 sets - 10 reps - 5 hold Seated Knee Flexion AAROM - 2 sets - 10 reps - 5 hold Supine Heel Slide with Strap - 2 sets - 10 reps - 5 hold

## 2019-06-15 ENCOUNTER — Encounter (HOSPITAL_COMMUNITY): Payer: Self-pay | Admitting: Physical Therapy

## 2019-06-15 ENCOUNTER — Ambulatory Visit (HOSPITAL_COMMUNITY): Payer: 59 | Admitting: Physical Therapy

## 2019-06-15 ENCOUNTER — Other Ambulatory Visit: Payer: Self-pay

## 2019-06-15 DIAGNOSIS — M6281 Muscle weakness (generalized): Secondary | ICD-10-CM

## 2019-06-15 DIAGNOSIS — G8929 Other chronic pain: Secondary | ICD-10-CM

## 2019-06-15 DIAGNOSIS — R262 Difficulty in walking, not elsewhere classified: Secondary | ICD-10-CM

## 2019-06-15 DIAGNOSIS — M25561 Pain in right knee: Secondary | ICD-10-CM | POA: Diagnosis not present

## 2019-06-15 NOTE — Therapy (Signed)
[place], [place], [address] Phone: [phone]   Fax:  [phone]  Physical Therapy Treatment  Patient Details  Name: Isabel Garcia MRN: [id number] Date of Birth: 1955/04/07 Referring Provider (PT): Danae Orleans, [place]   Encounter Date: 06/15/2019  PT End of Session - 06/15/19 1632    Visit Number  2    Number of Visits  19    Date for PT Re-Evaluation  08/08/19   eval 06/13/19   Authorization Type  [employer]; no auth required    Authorization - Visit Number  3    Authorization - Number of Visits  30    Progress Note Due on Visit  10    PT Start Time  1025    PT Stop Time  1617    PT Time Calculation (min)  40 min    Activity Tolerance  Patient tolerated treatment well;Patient limited by pain;Patient limited by fatigue    Behavior During Therapy  [facility] for tasks assessed/performed       Past Medical History:  Diagnosis Date  . Achilles tendinitis   . Arthritis   . Asthma   . Diverticulitis   . Diverticulosis   . GERD (gastroesophageal reflux disease)   . Heart murmur    as a child  . History of blood clots   . History of chicken pox   . History of pulmonary embolus (PE) 2018  . Hypertension   . Liver cirrhosis secondary to NASH (nonalcoholic steatohepatitis) ([place])   . Osteoarthritis   . Phlebitis   . Plantar fasciitis   . Vertigo     Past Surgical History:  Procedure Laterality Date  . ABDOMINAL HYSTERECTOMY    . bone spur removal    . KNEE ARTHROSCOPY Right   . KNEE ARTHROSCOPY Right   . 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   Procedure: TOTAL KNEE ARTHROPLASTY;  Surgeon: Paralee Cancel, MD;  Location: [facility];  Service: Orthopedics;  Laterality: Right;  70 mins    There were no vitals filed for this visit.  Subjective Assessment - 06/15/19 1548    Subjective  Patient reported she has been doing her HEP and is having some pain currently.    Pertinent History  hx of B DVT    Currently  in Pain?  Yes    Pain Score  5     Pain Location  Knee    Pain Orientation  Right    Pain Descriptors / Indicators  Aching    Pain Onset  More than a month ago    Pain Frequency  Intermittent                        [facility] Adult PT Treatment/Exercise - 06/15/19 0001      Ambulation/Gait   Ambulation/Gait  Yes    Ambulation/Gait Assistance  6: Modified independent (Device/Increase time);5: Supervision    Ambulation Distance (Feet)  115 Feet    Assistive device  Rolling walker    Ambulation Surface  Level;Indoor    Gait velocity  decreased    Gait Comments  VCS for improved heel to toe gait and to improve upright trunk      Exercises   Exercises  Knee/Hip      Knee/Hip Exercises: Seated   Other Seated Knee/Hip Exercises  Knee flexion AROM x10 5'' holds RT      Knee/Hip Exercises: Supine   Quad Sets  AROM;Right;1 set;10 reps;Left   DF and bil mm activation   Heel Slides  AROM;Strengthening;Right;1 set    Heel Slides Limitations  2 reps; very painful patient extremely frustrated    Knee Flexion  AROM    Knee Flexion Limitations  63   Measured with patient in sitting     Manual Therapy   Manual Therapy  Edema management    Manual therapy comments  all manual therapy completed separately from other skilled interventions    Edema Management  Bil LEs elevated. Retrograde massage to decrease edema and decrease pain               PT Short Term Goals - 06/15/19 1550      PT SHORT TERM GOAL #1   Title  Patient will demonstrate at least 10-90 degrees of right knee ROM to demonstrate improved knee mobility    Time  4    Period  Weeks    Status  On-going    Target Date  07/11/19      PT SHORT TERM GOAL #2   Title  Patient will report at least 25% improvement in overall symptoms and/or functional ability.    Time  4    Period  Weeks    Status  On-going    Target Date  07/11/19      PT SHORT TERM GOAL #3   Title  Patient will be independent in self  management strategies to improve quality of life and functional outcomes.    Time  4    Period  Weeks    Status  On-going    Target Date  07/11/19        PT Long Term Goals - 06/15/19 1551      PT LONG TERM GOAL #1   Title  Patient will be able to ascend and descend stairs with reciprocal gait pattern and use of railing as needed to improve ability to ascend and descend steps at home.    Time  8    Period  Weeks    Status  On-going      PT LONG TERM GOAL #2   Title  Patient will demonstrate 2-120 degrees of right knee ROM to demosntrate improved knee mobility    Time  8    Period  Weeks    Status  On-going      PT LONG TERM GOAL #3   Title  Patient will report at least 50% improvement in overall symptoms and/or functional ability.    Time  8    Period  Weeks    Status  On-going      PT LONG TERM GOAL #4   Title  Patient will be able to ambulate 2 minutes without major gait deviations and without assistive device to return to prior level of function    Time  8    Period  Weeks    Status  On-going            Plan - 06/15/19 1633    Clinical Impression Statement  Began with gait training on the way into the clinic focusing on heel to toe gait and maintaining an upright trunk with ambulation. Focused on ROM and edema reduction. Patient attempted supine heel slide this session, but reported significant pain and frustration with this so performed sitting this session instead. Patient's RT knee flexion AROM measured in sitting was 63 degrees this session. Ended session with retrograde massage to reduce edema and pain and  improve overall functional mobility. Educated patient on her HEP, retrograde massage, and patient's therapy goals.    Personal Factors and Comorbidities  Time since onset of injury/illness/exacerbation;Comorbidity 2;Fitness    Comorbidities  Hx of DVT, HTN    Examination-Activity Limitations  Stand;Locomotion Level;Transfers;Squat;Stairs;Bed  Mobility;Bathing;Sit;Sleep;Caring for Others;Lift;Hygiene/Grooming;Dressing;Toileting    Examination-Participation Restrictions  Driving;Community Activity;Cleaning;Shop;Meal Prep;Laundry    Stability/Clinical Decision Making  Stable/Uncomplicated    Rehab Potential  Good    PT Frequency  Other (comment)   3x/week for 3 weeks then 2x/week for 5 weeks   PT Duration  8 weeks    PT Treatment/Interventions  ADLs/Self Care Home Management;Aquatic Therapy;Cryotherapy;Electrical Stimulation;Moist Heat;DME Instruction;Gait training;Stair training;Functional mobility training;Therapeutic activities;Therapeutic exercise;Balance training;Neuromuscular re-education;Patient/family education;Orthotic Fit/Training;Manual techniques;Passive range of motion;Dry needling;Energy conservation;Splinting;Taping;Scar mobilization    PT Next Visit Plan  continue with a focus on quad activation, knee ROM, gait training    PT Home Exercise Plan  5/10 heel slides, knee AAROM seated flexion, quad sets    Consulted and Agree with Plan of Care  Patient       Patient will benefit from skilled therapeutic intervention in order to improve the following deficits and impairments:  Abnormal gait, Increased fascial restricitons, Improper body mechanics, Pain, Decreased mobility, Decreased activity tolerance, Decreased endurance, Decreased range of motion, Decreased strength, Hypomobility, Difficulty walking, Decreased knowledge of precautions, Decreased knowledge of use of DME, Decreased scar mobility, Increased edema, Decreased skin integrity, Decreased balance  Visit Diagnosis: Chronic pain of right knee  Muscle weakness (generalized)  Difficulty in walking, not elsewhere classified     Problem List Patient Active Problem List   Diagnosis Date Noted  . S/P right TKA 06/07/2019  . History of DVT (deep vein thrombosis) 04/26/2018  . Current use of long term anticoagulation 04/26/2018  . Chronic low back pain with  right-sided sciatica 10/22/2017  . Chronic pain of right knee 10/22/2017  . Right foot pain 10/22/2017  . Neck pain 10/22/2017  . Obesity (BMI 35.0-39.9 without comorbidity) 10/22/2017  . Healthcare maintenance 10/20/2017  . History of pulmonary embolus (PE) 10/20/2017  . GERD (gastroesophageal reflux disease) 09/08/2017  . Cough 08/11/2017  . Essential hypertension 08/11/2017  . Obstructive sleep apnea 08/11/2017   Clarene Critchley PT, DPT 4:42 PM, 06/15/19 [place] [address] [place], [place], [address] Phone: [phone]   Fax:  [phone]  Name: Isabel Garcia MRN: [id number] Date of Birth: 06-01-1955

## 2019-06-15 NOTE — Discharge Summary (Signed)
Physician Discharge Summary  Patient ID: META KROENKE MRN: [id number] DOB/AGE: 1955-03-03 64 y.o.  Admit date: 06/07/2019 Discharge date: 06/08/2019   Procedures:  Procedure(s) (LRB): TOTAL KNEE ARTHROPLASTY (Right)  Attending Physician:  Dr. Paralee Cancel   Admission Diagnoses:   Right knee primary OA / pain  Discharge Diagnoses:  Principal Problem:   S/P right TKA Active Problems:   Obesity (BMI 35.0-39.9 without comorbidity)  Past Medical History:  Diagnosis Date  . Achilles tendinitis   . Arthritis   . Asthma   . Diverticulitis   . Diverticulosis   . GERD (gastroesophageal reflux disease)   . Heart murmur    as a child  . History of blood clots   . History of chicken pox   . History of pulmonary embolus (PE) 2018  . Hypertension   . Liver cirrhosis secondary to NASH (nonalcoholic steatohepatitis) ([place])   . Osteoarthritis   . Phlebitis   . Plantar fasciitis   . Vertigo     HPI:    Isabel Garcia, 64 y.o. female, has a history of pain and functional disability in the right knee due to arthritis and has failed non-surgical conservative treatments for greater than 12 weeks to include NSAID's and/or analgesics, corticosteriod injections, viscosupplementation injections and activity modification.  Onset of symptoms was gradual, starting >10 years ago with gradually worsening course since that time. The patient noted prior procedures on the knee to include  arthroscopy on the right knee(s).  Patient currently rates pain in the right knee(s) at 10 out of 10 with activity. Patient has night pain, worsening of pain with activity and weight bearing, pain that interferes with activities of daily living, pain with passive range of motion, crepitus and joint swelling.  Patient has evidence of periarticular osteophytes and joint space narrowing by imaging studies. There is no active infection.  Risks, benefits and expectations were discussed with the patient.  Risks including but not  limited to the risk of anesthesia, blood clots, nerve damage, blood vessel damage, failure of the prosthesis, infection and up to and including death.  Patient understand the risks, benefits and expectations and wishes to proceed with surgery.   PCP: Marrian Salvage, FNP   Discharged Condition: good  Hospital Course:  Patient underwent the above stated procedure on 06/07/2019. Patient tolerated the procedure well and brought to the recovery room in good condition and subsequently to the floor.  POD #1 BP: 104/70 ; Pulse: 58 ; Temp: 97.9 F (36.6 C) ; Resp: 14 Patient reports pain as mild, pain controlled. No reported events throughout the night.  Discussed the procedure and expectations moving forward.  Ready to be discharged home. Dorsiflexion/plantar flexion intact, incision: dressing C/D/I, no cellulitis present and compartment soft.   LABS  Basename    HGB     11.2  HCT     33.8    Discharge Exam: General appearance: alert, cooperative and no distress Extremities: Homans sign is negative, no sign of DVT, no edema, redness or tenderness in the calves or thighs and no ulcers, gangrene or trophic changes  Disposition: Home with follow up in 2 weeks   Follow-up Information    Paralee Cancel, MD. Schedule an appointment as soon as possible for a visit in 2 weeks.   Specialty: Orthopedic Surgery Contact information: [address] [place] [address] [phone]           Discharge Instructions    Call MD / Call 911  Complete by: As directed    If you experience chest pain or shortness of breath, CALL 911 and be transported to the hospital emergency room.  If you develope a fever above 101 F, pus (white drainage) or increased drainage or redness at the wound, or calf pain, call your surgeon's office.   Change dressing   Complete by: As directed    Maintain surgical dressing until follow up in the clinic. If the edges start to pull up, may reinforce  with tape. If the dressing is no longer working, may remove and cover with gauze and tape, but must keep the area dry and clean.  Call with any questions or concerns.   Constipation Prevention   Complete by: As directed    Drink plenty of fluids.  Prune juice may be helpful.  You may use a stool softener, such as Colace (over the counter) 100 mg twice a day.  Use MiraLax (over the counter) for constipation as needed.   Diet - low sodium heart healthy   Complete by: As directed    Discharge instructions   Complete by: As directed    Maintain surgical dressing until follow up in the clinic. If the edges start to pull up, may reinforce with tape. If the dressing is no longer working, may remove and cover with gauze and tape, but must keep the area dry and clean.  Follow up in 2 weeks at [facility]. Call with any questions or concerns.   Increase activity slowly as tolerated   Complete by: As directed    Weight bearing as tolerated with assist device (walker, cane, etc) as directed, use it as long as suggested by your surgeon or therapist, typically at least 4-6 weeks.   TED hose   Complete by: As directed    Use stockings (TED hose) for 2 weeks on both leg(s).  You may remove them at night for sleeping.      Allergies as of 06/08/2019   No Known Allergies     Medication List    STOP taking these medications   Breo Ellipta 100-25 MCG/INH Aepb Generic drug: fluticasone furoate-vilanterol   diclofenac Sodium 1 % Gel Commonly known as: VOLTAREN   Pennsaid 2 % Soln Generic drug: Diclofenac Sodium     TAKE these medications   amLODipine 10 MG tablet Commonly known as: NORVASC Take 1 tablet (10 mg total) by mouth daily. for high blood pressure   apixaban 5 MG Tabs tablet Commonly known as: Eliquis Take 1 tablet (5 mg total) by mouth 2 (two) times daily.   atorvastatin 20 MG tablet Commonly known as: LIPITOR Take 1 tablet (20 mg total) by mouth daily. What changed: when to take  this   cholecalciferol 25 MCG (1000 UNIT) tablet Commonly known as: VITAMIN D3 Take 1,000 Units by mouth daily.   COD LIVER OIL PO Take 1 capsule by mouth daily.   docusate sodium 100 MG capsule Commonly known as: Colace Take 1 capsule (100 mg total) by mouth 2 (two) times daily.   ferrous sulfate 325 (65 FE) MG tablet Commonly known as: FerrouSul Take 1 tablet (325 mg total) by mouth 3 (three) times daily with meals for 14 days.   fluticasone 50 MCG/ACT nasal spray Commonly known as: FLONASE Place 2 sprays into both nostrils daily.   Fluticasone-Salmeterol 250-50 MCG/DOSE Aepb Commonly known as: Advair Diskus Inhale 1 puff into the lungs 2 (two) times daily.   HYDROcodone-acetaminophen 5-325 MG tablet Commonly known as: Norco  Take 1-2 tablets by mouth every 4 (four) hours as needed for moderate pain or severe pain.   meclizine 25 MG tablet Commonly known as: ANTIVERT Take 1 tablet (25 mg total) by mouth 3 (three) times daily as needed for dizziness.   methocarbamol 500 MG tablet Commonly known as: Robaxin Take 1 tablet (500 mg total) by mouth every 6 (six) hours as needed for muscle spasms.   montelukast 10 MG tablet Commonly known as: SINGULAIR Take 1 tablet (10 mg total) by mouth at bedtime.   omeprazole 40 MG capsule Commonly known as: PRILOSEC Take 1 capsule (40 mg total) by mouth daily.   polyethylene glycol 17 g packet Commonly known as: MIRALAX / GLYCOLAX Take 17 g by mouth 2 (two) times daily.   Proventil HFA 108 (90 Base) MCG/ACT inhaler Generic drug: albuterol Inhale 2 puffs into the lungs every 6 (six) hours as needed for wheezing or shortness of breath.   ZINC 15 PO Take 1 capsule by mouth daily.            Discharge Care Instructions  (From admission, onward)         Start     Ordered   06/08/19 0000  Change dressing    Comments: Maintain surgical dressing until follow up in the clinic. If the edges start to pull up, may reinforce with  tape. If the dressing is no longer working, may remove and cover with gauze and tape, but must keep the area dry and clean.  Call with any questions or concerns.   06/08/19 9794           Signed: [place]. Iliza Blankenbeckler   PA-C  06/15/2019, 4:47 PM

## 2019-06-17 ENCOUNTER — Encounter (HOSPITAL_COMMUNITY): Payer: Self-pay

## 2019-06-17 ENCOUNTER — Other Ambulatory Visit: Payer: Self-pay

## 2019-06-17 ENCOUNTER — Ambulatory Visit (HOSPITAL_COMMUNITY): Payer: 59

## 2019-06-17 DIAGNOSIS — M25561 Pain in right knee: Secondary | ICD-10-CM

## 2019-06-17 DIAGNOSIS — R262 Difficulty in walking, not elsewhere classified: Secondary | ICD-10-CM

## 2019-06-17 DIAGNOSIS — M6281 Muscle weakness (generalized): Secondary | ICD-10-CM

## 2019-06-17 DIAGNOSIS — G8929 Other chronic pain: Secondary | ICD-10-CM

## 2019-06-17 NOTE — Therapy (Signed)
[place], [place], [address] Phone: [phone]   Fax:  [phone]  Physical Therapy Treatment  Patient Details  Name: Isabel Garcia MRN: [id number] Date of Birth: 02-01-1956 Referring Provider (PT): Danae Orleans, [place]   Encounter Date: 06/17/2019  PT End of Session - 06/17/19 1759    Visit Number  3    Number of Visits  19    Date for PT Re-Evaluation  08/08/19   eval 06/13/19   Authorization Type  [employer]; no auth required    Authorization - Visit Number  4    Authorization - Number of Visits  30    Progress Note Due on Visit  10    PT Start Time  1705    PT Stop Time  1756   10' on ice, not included wiht charges   PT Time Calculation (min)  51 min    Equipment Utilized During Treatment  Gait belt    Activity Tolerance  Patient tolerated treatment well;Patient limited by pain;Patient limited by fatigue    Behavior During Therapy  [facility] for tasks assessed/performed       Past Medical History:  Diagnosis Date  . Achilles tendinitis   . Arthritis   . Asthma   . Diverticulitis   . Diverticulosis   . GERD (gastroesophageal reflux disease)   . Heart murmur    as a child  . History of blood clots   . History of chicken pox   . History of pulmonary embolus (PE) 2018  . Hypertension   . Liver cirrhosis secondary to NASH (nonalcoholic steatohepatitis) ([place])   . Osteoarthritis   . Phlebitis   . Plantar fasciitis   . Vertigo     Past Surgical History:  Procedure Laterality Date  . ABDOMINAL HYSTERECTOMY    . bone spur removal    . KNEE ARTHROSCOPY Right   . KNEE ARTHROSCOPY Right   . 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   Procedure: TOTAL KNEE ARTHROPLASTY;  Surgeon: Paralee Cancel, MD;  Location: [facility];  Service: Orthopedics;  Laterality: Right;  70 mins    There were no vitals filed for this visit.  Subjective Assessment - 06/17/19 1716    Subjective  Pt stated she had a good day  yesterday.  Stated she sat on low surface chair wiht increased pain upon standing.    Pertinent History  hx of B DVT    Patient Stated Goals  To have less pain    Currently in Pain?  Yes                        [facility] Adult PT Treatment/Exercise - 06/17/19 0001      Ambulation/Gait   Ambulation/Gait  Yes    Ambulation/Gait Assistance  6: Modified independent (Device/Increase time);5: Supervision    Ambulation Distance (Feet)  150 Feet    Assistive device  Rolling walker    Gait Pattern  Step-to pattern;Decreased stance time - right;Decreased hip/knee flexion - right;Decreased dorsiflexion - right;Decreased weight shift to right;Trunk flexed;Wide base of support    Ambulation Surface  Level;Indoor    Gait velocity  decreased    Gait Comments  VCS for improved heel to toe gait and to improve upright trunk      Exercises   Exercises  Knee/Hip      Knee/Hip Exercises: Stretches   Knee: Self-Stretch to increase Flexion  5 reps;10 seconds  Knee/Hip Exercises: Seated   Heel Slides  10 reps    Heel Slides Limitations  5" holds      Knee/Hip Exercises: Supine   Quad Sets  AROM;Right;1 set;10 reps;Left    Short Arc Quad Sets  AAROM;5 reps    Heel Slides  AROM;5 reps    Knee Extension  AROM    Knee Extension Limitations  14    Knee Flexion  AROM    Knee Flexion Limitations  64      Manual Therapy   Manual Therapy  Edema management;Other (comment)    Manual therapy comments  all manual therapy completed separately from other skilled interventions    Edema Management  Decongestive therapist with LE elevated    Other Manual Therapy  measurements taken for new              PT Education - 06/17/19 [facility]    Education Details  Measurements taken for new compression hose, paperwork given.  Reviewed RICE techniques for pain and edema control.    Person(s) Educated  Patient    Methods  Explanation;Handout    Comprehension  Verbalized understanding       PT  Short Term Goals - 06/15/19 1550      PT SHORT TERM GOAL #1   Title  Patient will demonstrate at least 10-90 degrees of right knee ROM to demonstrate improved knee mobility    Time  4    Period  Weeks    Status  On-going    Target Date  07/11/19      PT SHORT TERM GOAL #2   Title  Patient will report at least 25% improvement in overall symptoms and/or functional ability.    Time  4    Period  Weeks    Status  On-going    Target Date  07/11/19      PT SHORT TERM GOAL #3   Title  Patient will be independent in self management strategies to improve quality of life and functional outcomes.    Time  4    Period  Weeks    Status  On-going    Target Date  07/11/19        PT Long Term Goals - 06/15/19 1551      PT LONG TERM GOAL #1   Title  Patient will be able to ascend and descend stairs with reciprocal gait pattern and use of railing as needed to improve ability to ascend and descend steps at home.    Time  8    Period  Weeks    Status  On-going      PT LONG TERM GOAL #2   Title  Patient will demonstrate 2-120 degrees of right knee ROM to demosntrate improved knee mobility    Time  8    Period  Weeks    Status  On-going      PT LONG TERM GOAL #3   Title  Patient will report at least 50% improvement in overall symptoms and/or functional ability.    Time  8    Period  Weeks    Status  On-going      PT LONG TERM GOAL #4   Title  Patient will be able to ambulate 2 minutes without major gait deviations and without assistive device to return to prior level of function    Time  8    Period  Weeks    Status  On-going  Plan - 06/17/19 1828    Clinical Impression Statement  Began session with manual decognitive massage prior knee mobility exercises to address edema and pain.  Noted pt wearing older compression garments with tears.  Pt educated on [place]., measurements taken and given paperwork for new pain.  Therex focus on improving gait mechanics  for heel strike and equal stride length and ROM based exercises.  AROM improved 14-64 degrees.  Pt limited by pain, EOS with ice for pain and edema control.    Personal Factors and Comorbidities  Time since onset of injury/illness/exacerbation;Comorbidity 2;Fitness    Comorbidities  Hx of DVT, HTN    Examination-Activity Limitations  Stand;Locomotion Level;Transfers;Squat;Stairs;Bed Mobility;Bathing;Sit;Sleep;Caring for Others;Lift;Hygiene/Grooming;Dressing;Toileting    Examination-Participation Restrictions  Driving;Community Activity;Cleaning;Shop;Meal Prep;Laundry    Stability/Clinical Decision Making  Stable/Uncomplicated    Clinical Decision Making  Low    Rehab Potential  Good    PT Frequency  --   3x/week for 3 weeks then 2x/week for 5 weeks   PT Duration  8 weeks    PT Treatment/Interventions  ADLs/Self Care Home Management;Aquatic Therapy;Cryotherapy;Electrical Stimulation;Moist Heat;DME Instruction;Gait training;Stair training;Functional mobility training;Therapeutic activities;Therapeutic exercise;Balance training;Neuromuscular re-education;Patient/family education;Orthotic Fit/Training;Manual techniques;Passive range of motion;Dry needling;Energy conservation;Splinting;Taping;Scar mobilization    PT Next Visit Plan  continue with a focus on quad activation, knee ROM, gait training    PT Home Exercise Plan  5/10 heel slides, knee AAROM seated flexion, quad sets       Patient will benefit from skilled therapeutic intervention in order to improve the following deficits and impairments:  Abnormal gait, Increased fascial restricitons, Improper body mechanics, Pain, Decreased mobility, Decreased activity tolerance, Decreased endurance, Decreased range of motion, Decreased strength, Hypomobility, Difficulty walking, Decreased knowledge of precautions, Decreased knowledge of use of DME, Decreased scar mobility, Increased edema, Decreased skin integrity, Decreased balance  Visit  Diagnosis: Chronic pain of right knee  Muscle weakness (generalized)  Difficulty in walking, not elsewhere classified     Problem List Patient Active Problem List   Diagnosis Date Noted  . S/P right TKA 06/07/2019  . History of DVT (deep vein thrombosis) 04/26/2018  . Current use of long term anticoagulation 04/26/2018  . 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 Chronic pain of right knee 10/22/2017  . Right foot pain 10/22/2017  . Neck pain 10/22/2017  . Obesity (BMI 35.0-39.9 without comorbidity) 10/22/2017  . Healthcare maintenance 10/20/2017  . History of pulmonary embolus (PE) 10/20/2017  . GERD (gastroesophageal reflux disease) 09/08/2017  . Cough 08/11/2017  . Essential hypertension 08/11/2017  . Obstructive sleep apnea 08/11/2017   Ihor Austin, LPTA/CLT; CBIS [phone]  Aldona Lento 06/17/2019, 6:49 PM  [place], [place], [address] Phone: [phone]   Fax:  [phone]  Name: Isabel Garcia MRN: [id number] Date of Birth: Nov 13, 1955

## 2019-06-20 ENCOUNTER — Other Ambulatory Visit: Payer: Self-pay

## 2019-06-20 ENCOUNTER — Ambulatory Visit (HOSPITAL_COMMUNITY): Payer: 59 | Admitting: Physical Therapy

## 2019-06-20 ENCOUNTER — Encounter (HOSPITAL_COMMUNITY): Payer: Self-pay | Admitting: Physical Therapy

## 2019-06-20 DIAGNOSIS — M6281 Muscle weakness (generalized): Secondary | ICD-10-CM

## 2019-06-20 DIAGNOSIS — R262 Difficulty in walking, not elsewhere classified: Secondary | ICD-10-CM

## 2019-06-20 DIAGNOSIS — M25561 Pain in right knee: Secondary | ICD-10-CM | POA: Diagnosis not present

## 2019-06-20 DIAGNOSIS — G8929 Other chronic pain: Secondary | ICD-10-CM

## 2019-06-20 NOTE — Therapy (Signed)
[place], [place], [address] Phone: [phone]   Fax:  [phone]  Physical Therapy Treatment  Patient Details  Name: Isabel Garcia MRN: [id number] Date of Birth: Nov 20, 1955 Referring Provider (PT): Danae Orleans, [place]   Encounter Date: 06/20/2019   PT End of Session - 06/20/19 1240    Visit Number  4    Number of Visits  19    Date for PT Re-Evaluation  08/08/19   eval 06/13/19   Authorization Type  [employer]; no auth required    Authorization - Visit Number  5    Authorization - Number of Visits  30    Progress Note Due on Visit  10    PT Start Time  9935    PT Stop Time  1130    PT Time Calculation (min)  45 min    Equipment Utilized During Treatment  Gait belt    Activity Tolerance  Patient tolerated treatment well;Patient limited by pain;Patient limited by fatigue    Behavior During Therapy  [facility] for tasks assessed/performed       Past Medical History:  Diagnosis Date  . Achilles tendinitis   . Arthritis   . Asthma   . Diverticulitis   . Diverticulosis   . GERD (gastroesophageal reflux disease)   . Heart murmur    as a child  . History of blood clots   . History of chicken pox   . History of pulmonary embolus (PE) 2018  . Hypertension   . Liver cirrhosis secondary to NASH (nonalcoholic steatohepatitis) ([place])   . Osteoarthritis   . Phlebitis   . Plantar fasciitis   . Vertigo     Past Surgical History:  Procedure Laterality Date  . ABDOMINAL HYSTERECTOMY    . bone spur removal    . KNEE ARTHROSCOPY Right   . KNEE ARTHROSCOPY Right   . 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   Procedure: TOTAL KNEE ARTHROPLASTY;  Surgeon: Paralee Cancel, MD;  Location: [facility];  Service: Orthopedics;  Laterality: Right;  70 mins    There were no vitals filed for this visit.  Subjective Assessment - 06/20/19 1053    Subjective  States that the medially spot is still bothering her 4/10 pain. States the  massage really helped with her swelling last session. Wearing better compression stockings today and sees MD on wednesday    Pertinent History  hx of B DVT    Patient Stated Goals  To have less pain    Currently in Pain?  Yes    Pain Score  4     Pain Location  Knee    Pain Orientation  Right;Medial    Pain Descriptors / Indicators  Aching;Hervey Ard         [facility] PT Assessment - 06/20/19 0001      Assessment   Medical Diagnosis  R TKA    Referring Provider (PT)  Danae Orleans, PA    Next MD Visit  06/22/19                    [facility] Adult PT Treatment/Exercise - 06/20/19 0001      Ambulation/Gait   Ambulation/Gait  Yes    Ambulation/Gait Assistance  6: Modified independent (Device/Increase time);5: Supervision    Ambulation Distance (Feet)  175 Feet    Assistive device  Rolling walker    Gait Pattern  Step-through pattern;Decreased hip/knee flexion - right;Decreased dorsiflexion - right;Decreased weight  shift to right    Gait Comments  tactile and verbal cues for increased knee bending.       Knee/Hip Exercises: Stretches   Gastroc Stretch  Right;3 reps;30 seconds   with strap      Knee/Hip Exercises: Seated   Long Arc Quad  AROM;Right;3 sets;5 reps   3" holds     Knee/Hip Exercises: Supine   Short Arc Quad Sets  AROM;Right;4 sets;5 reps   5" holds, black foam roller   Heel Slides  AROM;Right;AAROM;20 reps   5" holds   Knee Extension  AROM    Knee Extension Limitations  15   lacking   Knee Flexion  AROM    Knee Flexion Limitations  82    Other Supine Knee/Hip Exercises  knee flexion/extension on ball - x30 B               PT Short Term Goals - 06/15/19 1550      PT SHORT TERM GOAL #1   Title  Patient will demonstrate at least 10-90 degrees of right knee ROM to demonstrate improved knee mobility    Time  4    Period  Weeks    Status  On-going    Target Date  07/11/19      PT SHORT TERM GOAL #2   Title  Patient will report at least 25%  improvement in overall symptoms and/or functional ability.    Time  4    Period  Weeks    Status  On-going    Target Date  07/11/19      PT SHORT TERM GOAL #3   Title  Patient will be independent in self management strategies to improve quality of life and functional outcomes.    Time  4    Period  Weeks    Status  On-going    Target Date  07/11/19        PT Long Term Goals - 06/15/19 1551      PT LONG TERM GOAL #1   Title  Patient will be able to ascend and descend stairs with reciprocal gait pattern and use of railing as needed to improve ability to ascend and descend steps at home.    Time  8    Period  Weeks    Status  On-going      PT LONG TERM GOAL #2   Title  Patient will demonstrate 2-120 degrees of right knee ROM to demosntrate improved knee mobility    Time  8    Period  Weeks    Status  On-going      PT LONG TERM GOAL #3   Title  Patient will report at least 50% improvement in overall symptoms and/or functional ability.    Time  8    Period  Weeks    Status  On-going      PT LONG TERM GOAL #4   Title  Patient will be able to ambulate 2 minutes without major gait deviations and without assistive device to return to prior level of function    Time  8    Period  Weeks    Status  On-going            Plan - 06/20/19 1046    Clinical Impression Statement  Focused on quad activation and  knee ROM on this date. Improved knee flexion noted but continues to have a difficult time with active motion and exercises. Patient continues to use upper extremities  to assist with getting leg up/off od table. Pain mainly localized at medial aspect of knee. Improved gait mechanics noted with gait training.    Personal Factors and Comorbidities  Time since onset of injury/illness/exacerbation;Comorbidity 2;Fitness    Comorbidities  Hx of DVT, HTN    Examination-Activity Limitations  Stand;Locomotion Level;Transfers;Squat;Stairs;Bed Mobility;Bathing;Sit;Sleep;Caring for  Others;Lift;Hygiene/Grooming;Dressing;Toileting    Examination-Participation Restrictions  Driving;Community Activity;Cleaning;Shop;Meal Prep;Laundry    Stability/Clinical Decision Making  Stable/Uncomplicated    Rehab Potential  Good    PT Frequency  --   3x/week for 3 weeks then 2x/week for 5 weeks   PT Duration  8 weeks    PT Treatment/Interventions  ADLs/Self Care Home Management;Aquatic Therapy;Cryotherapy;Electrical Stimulation;Moist Heat;DME Instruction;Gait training;Stair training;Functional mobility training;Therapeutic activities;Therapeutic exercise;Balance training;Neuromuscular re-education;Patient/family education;Orthotic Fit/Training;Manual techniques;Passive range of motion;Dry needling;Energy conservation;Splinting;Taping;Scar mobilization    PT Next Visit Plan  continue with a focus on quad activation, knee ROM, gait training    PT Home Exercise Plan  5/10 heel slides, knee AAROM seated flexion, quad sets; 5/17 LAQs       Patient will benefit from skilled therapeutic intervention in order to improve the following deficits and impairments:  Abnormal gait, Increased fascial restricitons, Improper body mechanics, Pain, Decreased mobility, Decreased activity tolerance, Decreased endurance, Decreased range of motion, Decreased strength, Hypomobility, Difficulty walking, Decreased knowledge of precautions, Decreased knowledge of use of DME, Decreased scar mobility, Increased edema, Decreased skin integrity, Decreased balance  Visit Diagnosis: Chronic pain of right knee  Muscle weakness (generalized)  Difficulty in walking, not elsewhere classified     Problem List Patient Active Problem List   Diagnosis Date Noted  . S/P right TKA 06/07/2019  . History of DVT (deep vein thrombosis) 04/26/2018  . Current use of long term anticoagulation 04/26/2018  . 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 Chronic pain of right knee 10/22/2017  . Right foot pain  10/22/2017  . Neck pain 10/22/2017  . Obesity (BMI 35.0-39.9 without comorbidity) 10/22/2017  . Healthcare maintenance 10/20/2017  . History of pulmonary embolus (PE) 10/20/2017  . GERD (gastroesophageal reflux disease) 09/08/2017  . Cough 08/11/2017  . Essential hypertension 08/11/2017  . Obstructive sleep apnea 08/11/2017   12:43 PM, 06/20/19 Jerene Pitch, DPT Physical Therapy with [facility]  [phone] office  [place] [address] [place], [place], [address] Phone: [phone]   Fax:  [phone]  Name: MYLEKA MONCURE MRN: [id number] Date of Birth: [birth year]

## 2019-06-22 ENCOUNTER — Ambulatory Visit (HOSPITAL_COMMUNITY): Payer: 59

## 2019-06-22 ENCOUNTER — Other Ambulatory Visit: Payer: Self-pay

## 2019-06-22 ENCOUNTER — Encounter (HOSPITAL_COMMUNITY): Payer: Self-pay

## 2019-06-22 DIAGNOSIS — M25561 Pain in right knee: Secondary | ICD-10-CM

## 2019-06-22 DIAGNOSIS — R262 Difficulty in walking, not elsewhere classified: Secondary | ICD-10-CM

## 2019-06-22 DIAGNOSIS — M6281 Muscle weakness (generalized): Secondary | ICD-10-CM

## 2019-06-22 NOTE — Therapy (Signed)
[place], [place], [address] Phone: [phone]   Fax:  [phone]  Physical Therapy Treatment  Patient Details  Name: Isabel Garcia MRN: [id number] Date of Birth: [birth year] Referring Provider (PT): Danae Orleans, [place]   Encounter Date: 06/22/2019  PT End of Session - 06/22/19 1537    Visit Number  5    Number of Visits  19    Date for PT Re-Evaluation  08/08/19   eval: 06/13/19   Authorization Type  [employer]; no auth required    Authorization - Visit Number  6    Authorization - Number of Visits  30    Progress Note Due on Visit  10    PT Start Time  1450   3' on bike, not included wiht charges   PT Stop Time  1535    PT Time Calculation (min)  45 min    Activity Tolerance  Patient tolerated treatment well;Patient limited by pain;Patient limited by fatigue    Behavior During Therapy  [facility] for tasks assessed/performed       Past Medical History:  Diagnosis Date  . Achilles tendinitis   . Arthritis   . Asthma   . Diverticulitis   . Diverticulosis   . GERD (gastroesophageal reflux disease)   . Heart murmur    as a child  . History of blood clots   . History of chicken pox   . History of pulmonary embolus (PE) 2018  . Hypertension   . Liver cirrhosis secondary to NASH (nonalcoholic steatohepatitis) ([place])   . Osteoarthritis   . Phlebitis   . Plantar fasciitis   . Vertigo     Past Surgical History:  Procedure Laterality Date  . ABDOMINAL HYSTERECTOMY    . bone spur removal    . KNEE ARTHROSCOPY Right   . KNEE ARTHROSCOPY Right   . 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   Procedure: TOTAL KNEE ARTHROPLASTY;  Surgeon: Paralee Cancel, MD;  Location: [facility];  Service: Orthopedics;  Laterality: Right;  70 mins    There were no vitals filed for this visit.  Subjective Assessment - 06/22/19 1457    Subjective  Pt stated MD was happy with knee swelling reduced, wishes to continues with ROM.  Pt  arrived with compression stocking today.  Current pain scale 5/10 Rt knee.  Reports her back has been bothering her lately.    Pertinent History  hx of B DVT    Patient Stated Goals  To have less pain    Currently in Pain?  Yes    Pain Score  5     Pain Location  Knee    Pain Orientation  Right    Pain Descriptors / Indicators  Aching;Dull;Sore    Pain Type  Surgical pain    Pain Onset  More than a month ago    Pain Frequency  Intermittent    Aggravating Factors   standing and walking    Pain Relieving Factors  getting off of her                        [facility] Adult PT Treatment/Exercise - 06/22/19 0001      Ambulation/Gait   Ambulation/Gait  Yes    Ambulation/Gait Assistance  6: Modified independent (Device/Increase time);5: Supervision    Ambulation Distance (Feet)  200 Feet    Assistive device  Rolling walker    Gait Pattern  Step-through  pattern;Decreased hip/knee flexion - right;Decreased dorsiflexion - right;Decreased weight shift to right    Gait Comments  tactile and verbal cues for increased knee bending.       Exercises   Exercises  Knee/Hip      Knee/Hip Exercises: Stretches   Knee: Self-Stretch to increase Flexion  5 reps;10 seconds    Gastroc Stretch  Both;3 reps;30 seconds    Gastroc Stretch Limitations  slant board      Knee/Hip Exercises: Aerobic   Stationary Bike  3' seat 15 rocking, not included with charges      Knee/Hip Exercises: Supine   Quad Sets  AROM;Right;1 set;10 reps;Left    Short Arc Quad Sets  AROM;2 sets;5 reps    Heel Slides  2 sets;5 reps    Knee Extension  AROM    Knee Extension Limitations  11    Knee Flexion  AROM    Knee Flexion Limitations  80      Manual Therapy   Manual Therapy  Edema management;Other (comment)    Manual therapy comments  all manual therapy completed separately from other skilled interventions    Edema Management  Decongestive therapist with LE elevated               PT Short Term Goals  - 06/15/19 1550      PT SHORT TERM GOAL #1   Title  Patient will demonstrate at least 10-90 degrees of right knee ROM to demonstrate improved knee mobility    Time  4    Period  Weeks    Status  On-going    Target Date  07/11/19      PT SHORT TERM GOAL #2   Title  Patient will report at least 25% improvement in overall symptoms and/or functional ability.    Time  4    Period  Weeks    Status  On-going    Target Date  07/11/19      PT SHORT TERM GOAL #3   Title  Patient will be independent in self management strategies to improve quality of life and functional outcomes.    Time  4    Period  Weeks    Status  On-going    Target Date  07/11/19        PT Long Term Goals - 06/15/19 1551      PT LONG TERM GOAL #1   Title  Patient will be able to ascend and descend stairs with reciprocal gait pattern and use of railing as needed to improve ability to ascend and descend steps at home.    Time  8    Period  Weeks    Status  On-going      PT LONG TERM GOAL #2   Title  Patient will demonstrate 2-120 degrees of right knee ROM to demosntrate improved knee mobility    Time  8    Period  Weeks    Status  On-going      PT LONG TERM GOAL #3   Title  Patient will report at least 50% improvement in overall symptoms and/or functional ability.    Time  8    Period  Weeks    Status  On-going      PT LONG TERM GOAL #4   Title  Patient will be able to ambulate 2 minutes without major gait deviations and without assistive device to return to prior level of function    Time  8  Period  Weeks    Status  On-going            Plan - 06/22/19 1525    Clinical Impression Statement  Pt educated on importance of improving gait mechanics and posture to improve knee mobility and reduce LBP.  Session focus wiht ROM based therapy.  Pt continues to require verbal and tactile cueing to improve quadriceps contractions and reduce gluteal activation.  Continues wiht stretches for knee mobility and  additional bike to improve flexoin.  Pt presents with vast improvements with gait mechanics at EOS.  AROM 11-80 degrees.  Manual decongitive massage complete for edema control.    Personal Factors and Comorbidities  Time since onset of injury/illness/exacerbation;Comorbidity 2;Fitness    Comorbidities  Hx of DVT, HTN    Examination-Activity Limitations  Stand;Locomotion Level;Transfers;Squat;Stairs;Bed Mobility;Bathing;Sit;Sleep;Caring for Others;Lift;Hygiene/Grooming;Dressing;Toileting    Examination-Participation Restrictions  Driving;Community Activity;Cleaning;Shop;Meal Prep;Laundry    Stability/Clinical Decision Making  Stable/Uncomplicated    Clinical Decision Making  Low    Rehab Potential  Good    PT Frequency  --   3x/week for 3 weeks then 2x/week for 5 weeks   PT Duration  8 weeks    PT Treatment/Interventions  ADLs/Self Care Home Management;Aquatic Therapy;Cryotherapy;Electrical Stimulation;Moist Heat;DME Instruction;Gait training;Stair training;Functional mobility training;Therapeutic activities;Therapeutic exercise;Balance training;Neuromuscular re-education;Patient/family education;Orthotic Fit/Training;Manual techniques;Passive range of motion;Dry needling;Energy conservation;Splinting;Taping;Scar mobilization    PT Next Visit Plan  continue with a focus on quad activation, knee ROM, gait training    PT Home Exercise Plan  5/10 heel slides, knee AAROM seated flexion, quad sets; 5/17 LAQs       Patient will benefit from skilled therapeutic intervention in order to improve the following deficits and impairments:  Abnormal gait, Increased fascial restricitons, Improper body mechanics, Pain, Decreased mobility, Decreased activity tolerance, Decreased endurance, Decreased range of motion, Decreased strength, Hypomobility, Difficulty walking, Decreased knowledge of precautions, Decreased knowledge of use of DME, Decreased scar mobility, Increased edema, Decreased skin integrity, Decreased  balance  Visit Diagnosis: Chronic pain of right knee  Muscle weakness (generalized)  Difficulty in walking, not elsewhere classified     Problem List Patient Active Problem List   Diagnosis Date Noted  . S/P right TKA 06/07/2019  . History of DVT (deep vein thrombosis) 04/26/2018  . Current use of long term anticoagulation 04/26/2018  . 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 Chronic pain of right knee 10/22/2017  . Right foot pain 10/22/2017  . Neck pain 10/22/2017  . Obesity (BMI 35.0-39.9 without comorbidity) 10/22/2017  . Healthcare maintenance 10/20/2017  . History of pulmonary embolus (PE) 10/20/2017  . GERD (gastroesophageal reflux disease) 09/08/2017  . Cough 08/11/2017  . Essential hypertension 08/11/2017  . Obstructive sleep apnea 08/11/2017   Ihor Austin, LPTA/CLT; CBIS [phone] Aldona Lento 06/22/2019, 6:03 PM  [place], [place], [address] Phone: [phone]   Fax:  [phone]  Name: Isabel Garcia MRN: [id number] Date of Birth: 23-May-1955

## 2019-06-24 ENCOUNTER — Other Ambulatory Visit: Payer: Self-pay

## 2019-06-24 ENCOUNTER — Ambulatory Visit (HOSPITAL_COMMUNITY): Payer: 59 | Admitting: Physical Therapy

## 2019-06-24 ENCOUNTER — Encounter (HOSPITAL_COMMUNITY): Payer: Self-pay | Admitting: Physical Therapy

## 2019-06-24 DIAGNOSIS — M25561 Pain in right knee: Secondary | ICD-10-CM

## 2019-06-24 DIAGNOSIS — G8929 Other chronic pain: Secondary | ICD-10-CM

## 2019-06-24 DIAGNOSIS — R262 Difficulty in walking, not elsewhere classified: Secondary | ICD-10-CM

## 2019-06-24 DIAGNOSIS — M6281 Muscle weakness (generalized): Secondary | ICD-10-CM

## 2019-06-24 NOTE — Therapy (Signed)
[place], [place], [address] Phone: [phone]   Fax:  [phone]  Physical Therapy Treatment  Patient Details  Name: Isabel Garcia MRN: [id number] Date of Birth: 03/19/1955 Referring Provider (PT): Danae Orleans, [place]   Encounter Date: 06/24/2019  PT End of Session - 06/24/19 1019    Visit Number  6    Number of Visits  19    Date for PT Re-Evaluation  08/08/19   eval: 06/13/19   Authorization Type  [employer]; no auth required    Authorization - Visit Number  6    Authorization - Number of Visits  30    Progress Note Due on Visit  10    PT Start Time  1005    PT Stop Time  1045    PT Time Calculation (min)  40 min    Activity Tolerance  Patient tolerated treatment well;Patient limited by pain    Behavior During Therapy  [facility] for tasks assessed/performed       Past Medical History:  Diagnosis Date  . Achilles tendinitis   . Arthritis   . Asthma   . Diverticulitis   . Diverticulosis   . GERD (gastroesophageal reflux disease)   . Heart murmur    as a child  . History of blood clots   . History of chicken pox   . History of pulmonary embolus (PE) 2018  . Hypertension   . Liver cirrhosis secondary to NASH (nonalcoholic steatohepatitis) ([place])   . Osteoarthritis   . Phlebitis   . Plantar fasciitis   . Vertigo     Past Surgical History:  Procedure Laterality Date  . ABDOMINAL HYSTERECTOMY    . bone spur removal    . KNEE ARTHROSCOPY Right   . KNEE ARTHROSCOPY Right   . 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   Procedure: TOTAL KNEE ARTHROPLASTY;  Surgeon: Paralee Cancel, MD;  Location: [facility];  Service: Orthopedics;  Laterality: Right;  70 mins    There were no vitals filed for this visit.  Subjective Assessment - 06/24/19 1009    Subjective  Pt stated MD was happy with knee swelling reduced, wishes to continues with ROM.  Pt arrived with compression stocking today.  Current pain scale 5/10 Rt  knee.  Reports her back has been bothering her lately.    Pertinent History  hx of B DVT    Limitations  Standing;Walking;Sitting    How long can you sit comfortably?  30 minutes because feet start swelling    How long can you stand comfortably?  5 minutes    How long can you walk comfortably?  5 minutes    Patient Stated Goals  To have less pain    Currently in Pain?  Yes    Pain Score  4     Pain Location  Knee    Pain Orientation  Right    Pain Descriptors / Indicators  Aching    Pain Type  Acute pain    Pain Onset  More than a month ago    Pain Frequency  Constant    Aggravating Factors   standing    Pain Relieving Factors  ice              [facility] Adult PT Treatment/Exercise - 06/24/19 0001      Ambulation/Gait   Ambulation/Gait  Yes    Ambulation/Gait Assistance  6: Modified independent (Device/Increase time);5: Supervision    Ambulation  Distance (Feet)  75 Feet    Assistive device  Rolling walker    Gait Pattern  Step-through pattern;Decreased hip/knee flexion - right;Decreased dorsiflexion - right;Decreased weight shift to right    Gait Comments  tactile and verbal cues for increased knee bending.       Exercises   Exercises  Knee/Hip      Knee/Hip Exercises: Stretches   Knee: Self-Stretch to increase Flexion  5 reps;10 seconds    Gastroc Stretch  Both;3 reps;30 seconds    Gastroc Stretch Limitations  slant board      Knee/Hip Exercises: Aerobic   Stationary Bike  3' seat 15 rocking, not included with charges      Knee/Hip Exercises: Standing   Heel Raises  Both;10 reps    Terminal Knee Extension  Right;10 reps    Rocker Board  2 minutes      Knee/Hip Exercises: Supine   Quad Sets  AROM;Right;1 set;10 reps;Left    Short Arc Quad Sets  --    Heel Slides  2 sets;5 reps    Knee Extension  AROM    Knee Extension Limitations  9    Knee Flexion  AROM    Knee Flexion Limitations  88      Manual Therapy   Manual Therapy  Edema management;Other (comment)     Manual therapy comments  all manual therapy completed separately from other skilled interventions    Edema Management  Decongestive therapist with LE elevated               PT Short Term Goals - 06/15/19 1550      PT SHORT TERM GOAL #1   Title  Patient will demonstrate at least 10-90 degrees of right knee ROM to demonstrate improved knee mobility    Time  4    Period  Weeks    Status  On-going    Target Date  07/11/19      PT SHORT TERM GOAL #2   Title  Patient will report at least 25% improvement in overall symptoms and/or functional ability.    Time  4    Period  Weeks    Status  On-going    Target Date  07/11/19      PT SHORT TERM GOAL #3   Title  Patient will be independent in self management strategies to improve quality of life and functional outcomes.    Time  4    Period  Weeks    Status  On-going    Target Date  07/11/19        PT Long Term Goals - 06/15/19 1551      PT LONG TERM GOAL #1   Title  Patient will be able to ascend and descend stairs with reciprocal gait pattern and use of railing as needed to improve ability to ascend and descend steps at home.    Time  8    Period  Weeks    Status  On-going      PT LONG TERM GOAL #2   Title  Patient will demonstrate 2-120 degrees of right knee ROM to demosntrate improved knee mobility    Time  8    Period  Weeks    Status  On-going      PT LONG TERM GOAL #3   Title  Patient will report at least 50% improvement in overall symptoms and/or functional ability.    Time  8    Period  Weeks  Status  On-going      PT LONG TERM GOAL #4   Title  Patient will be able to ambulate 2 minutes without major gait deviations and without assistive device to return to prior level of function    Time  8    Period  Weeks    Status  On-going            Plan - 06/24/19 1019    Clinical Impression Statement  Pt has significant edema.  Educated on proper heel toe gt to attempt to get calf pumping assistance with  edema.  Treatment focused on ROM to include edema management to allow improved ROM    Personal Factors and Comorbidities  Time since onset of injury/illness/exacerbation;Comorbidity 2;Fitness    Comorbidities  Hx of DVT, HTN    Examination-Activity Limitations  Stand;Locomotion Level;Transfers;Squat;Stairs;Bed Mobility;Bathing;Sit;Sleep;Caring for Others;Lift;Hygiene/Grooming;Dressing;Toileting    Examination-Participation Restrictions  Driving;Community Activity;Cleaning;Shop;Meal Prep;Laundry    Stability/Clinical Decision Making  Stable/Uncomplicated    Rehab Potential  Good    PT Frequency  --   3x/week for 3 weeks then 2x/week for 5 weeks   PT Duration  8 weeks    PT Treatment/Interventions  ADLs/Self Care Home Management;Aquatic Therapy;Cryotherapy;Electrical Stimulation;Moist Heat;DME Instruction;Gait training;Stair training;Functional mobility training;Therapeutic activities;Therapeutic exercise;Balance training;Neuromuscular re-education;Patient/family education;Orthotic Fit/Training;Manual techniques;Passive range of motion;Dry needling;Energy conservation;Splinting;Taping;Scar mobilization    PT Next Visit Plan  continue with a focus on quad activation, knee ROM, gait training    PT Home Exercise Plan  5/10 heel slides, knee AAROM seated flexion, quad sets; 5/17 LAQs       Patient will benefit from skilled therapeutic intervention in order to improve the following deficits and impairments:  Abnormal gait, Increased fascial restricitons, Improper body mechanics, Pain, Decreased mobility, Decreased activity tolerance, Decreased endurance, Decreased range of motion, Decreased strength, Hypomobility, Difficulty walking, Decreased knowledge of precautions, Decreased knowledge of use of DME, Decreased scar mobility, Increased edema, Decreased skin integrity, Decreased balance  Visit Diagnosis: Chronic pain of right knee  Muscle weakness (generalized)  Difficulty in walking, not elsewhere  classified     Problem List Patient Active Problem List   Diagnosis Date Noted  . S/P right TKA 06/07/2019  . History of DVT (deep vein thrombosis) 04/26/2018  . Current use of long term anticoagulation 04/26/2018  . 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 Chronic pain of right knee 10/22/2017  . Right foot pain 10/22/2017  . Neck pain 10/22/2017  . Obesity (BMI 35.0-39.9 without comorbidity) 10/22/2017  . Healthcare maintenance 10/20/2017  . History of pulmonary embolus (PE) 10/20/2017  . GERD (gastroesophageal reflux disease) 09/08/2017  . Cough 08/11/2017  . Essential hypertension 08/11/2017  . Obstructive sleep apnea 08/11/2017    Rayetta Humphrey, PT CLT [phone] 06/24/2019, 10:48 AM  [place] [address] [place], [place], [address] Phone: [phone]   Fax:  [phone]  Name: TRYSTEN BERTI MRN: [id number] Date of Birth: 1955-11-12

## 2019-06-27 ENCOUNTER — Other Ambulatory Visit: Payer: Self-pay

## 2019-06-27 ENCOUNTER — Encounter (HOSPITAL_COMMUNITY): Payer: Self-pay | Admitting: Physical Therapy

## 2019-06-27 ENCOUNTER — Ambulatory Visit (HOSPITAL_COMMUNITY): Payer: 59 | Admitting: Physical Therapy

## 2019-06-27 ENCOUNTER — Encounter (HOSPITAL_COMMUNITY): Payer: 59 | Admitting: Physical Therapy

## 2019-06-27 DIAGNOSIS — R262 Difficulty in walking, not elsewhere classified: Secondary | ICD-10-CM

## 2019-06-27 DIAGNOSIS — G8929 Other chronic pain: Secondary | ICD-10-CM

## 2019-06-27 DIAGNOSIS — M25561 Pain in right knee: Secondary | ICD-10-CM | POA: Diagnosis not present

## 2019-06-27 DIAGNOSIS — M6281 Muscle weakness (generalized): Secondary | ICD-10-CM

## 2019-06-27 NOTE — Patient Instructions (Signed)
Access Code: [email or web address] URL: [email or web address] Date: 06/27/2019 Prepared by: Yetta Glassman  Exercises Prone Terminal Knee Extension - 3 sets - 5 reps - 5 hold Prone Knee Extension Hang - 2 sets - 5 reps - 60 hold Prone Knee Flexion - 3 sets - 5 reps - 5 hold

## 2019-06-27 NOTE — Therapy (Signed)
[place], [place], [address] Phone: [phone]   Fax:  [phone]  Physical Therapy Treatment  Patient Details  Name: Isabel Garcia MRN: [id number] Date of Birth: [birth year] Referring Provider (PT): Danae Orleans, [place]   Encounter Date: 06/27/2019  PT End of Session - 06/27/19 1030    Visit Number  7    Number of Visits  19    Date for PT Re-Evaluation  08/08/19   eval: 06/13/19   Authorization Type  [employer]; no auth required    Authorization - Visit Number  7    Authorization - Number of Visits  30    Progress Note Due on Visit  10    PT Start Time  1030    PT Stop Time  1120    PT Time Calculation (min)  50 min    Activity Tolerance  Patient tolerated treatment well;Patient limited by pain    Behavior During Therapy  [facility] for tasks assessed/performed       Past Medical History:  Diagnosis Date  . Achilles tendinitis   . Arthritis   . Asthma   . Diverticulitis   . Diverticulosis   . GERD (gastroesophageal reflux disease)   . Heart murmur    as a child  . History of blood clots   . History of chicken pox   . History of pulmonary embolus (PE) 2018  . Hypertension   . Liver cirrhosis secondary to NASH (nonalcoholic steatohepatitis) ([place])   . Osteoarthritis   . Phlebitis   . Plantar fasciitis   . Vertigo     Past Surgical History:  Procedure Laterality Date  . ABDOMINAL HYSTERECTOMY    . bone spur removal    . KNEE ARTHROSCOPY Right   . KNEE ARTHROSCOPY Right   . 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   Procedure: TOTAL KNEE ARTHROPLASTY;  Surgeon: Paralee Cancel, MD;  Location: [facility];  Service: Orthopedics;  Laterality: Right;  70 mins    There were no vitals filed for this visit.  Subjective Assessment - 06/27/19 1036    Subjective  States yesterday she was standing to make dinner and right hip started to hurt. States that it feels deep in the bone. States that the pain is 6/10 in her  right hip and 5/10 in her knee. States she is now able to lift her leg on the table. STates that she is working on the bending.    Pertinent History  hx of B DVT    Limitations  Standing;Walking;Sitting    How long can you sit comfortably?  30 minutes because feet start swelling    How long can you stand comfortably?  5 minutes    How long can you walk comfortably?  5 minutes    Patient Stated Goals  To have less pain    Currently in Pain?  Yes    Pain Score  6     Pain Location  Hip    Pain Orientation  Right    Pain Descriptors / Indicators  Aching;Tightness    Pain Type  Acute pain    Pain Onset  More than a month ago         [facility] PT Assessment - 06/27/19 0001      Assessment   Medical Diagnosis  R TKA    Referring Provider (PT)  Danae Orleans, PA    Onset Date/Surgical Date  06/07/19  [facility] Adult PT Treatment/Exercise - 06/27/19 0001      Knee/Hip Exercises: Aerobic   Stationary Bike  5; seat 15 rocking back and forth end of session not included in charges.       Knee/Hip Exercises: Seated   Other Seated Knee/Hip Exercises  edge of bed with contract relex  knee fleixon 3x5 5" holds       Knee/Hip Exercises: Supine   Heel Slides  AROM;Right   with foot of bed elevated - 5 minutes PT assist   Knee Extension  AROM;Left    Knee Extension Limitations  14   lacking--> END OF SESSION LACKING 8   Knee Flexion  AROM;Right    Knee Flexion Limitations  85    Other Supine Knee/Hip Exercises  knee flexion/extension on ball - x30 B      Knee/Hip Exercises: Prone   Hamstring Curl  3 sets;5 reps    R PT assist and holds at end range   Prone Knee Hang  1 minute   x3, R   Other Prone Exercises  TKE R 5" holds 3x5     Other Prone Exercises  contract/relax knee flexion R - 3x5, 5" holds       Manual Therapy   Manual Therapy  Soft tissue mobilization    Manual therapy comments  all manual therapy completed separately from other skilled  interventions    Soft tissue mobilization  to right hip musculature - patietn supine - less pain noted afterwards.              PT Education - 06/27/19 1124    Education Details  in HEP    Person(s) Educated  Patient    Methods  Explanation;Handout    Comprehension  Verbalized understanding       PT Short Term Goals - 06/15/19 1550      PT SHORT TERM GOAL #1   Title  Patient will demonstrate at least 10-90 degrees of right knee ROM to demonstrate improved knee mobility    Time  4    Period  Weeks    Status  On-going    Target Date  07/11/19      PT SHORT TERM GOAL #2   Title  Patient will report at least 25% improvement in overall symptoms and/or functional ability.    Time  4    Period  Weeks    Status  On-going    Target Date  07/11/19      PT SHORT TERM GOAL #3   Title  Patient will be independent in self management strategies to improve quality of life and functional outcomes.    Time  4    Period  Weeks    Status  On-going    Target Date  07/11/19        PT Long Term Goals - 06/15/19 1551      PT LONG TERM GOAL #1   Title  Patient will be able to ascend and descend stairs with reciprocal gait pattern and use of railing as needed to improve ability to ascend and descend steps at home.    Time  8    Period  Weeks    Status  On-going      PT LONG TERM GOAL #2   Title  Patient will demonstrate 2-120 degrees of right knee ROM to demosntrate improved knee mobility    Time  8    Period  Weeks    Status  On-going  PT LONG TERM GOAL #3   Title  Patient will report at least 50% improvement in overall symptoms and/or functional ability.    Time  8    Period  Weeks    Status  On-going      PT LONG TERM GOAL #4   Title  Patient will be able to ambulate 2 minutes without major gait deviations and without assistive device to return to prior level of function    Time  8    Period  Weeks    Status  On-going            Plan - 06/27/19 1253     Clinical Impression Statement  Patient tolerated manual to right hip well reporting no pain in hip end of sesssion. Continued limitations in knee ROM noted but improved extension noted by end of session. Patient will continue to benefit from skilled physical therapy to improve functional ROM and overall mobility.    Personal Factors and Comorbidities  Time since onset of injury/illness/exacerbation;Comorbidity 2;Fitness    Comorbidities  Hx of DVT, HTN    Examination-Activity Limitations  Stand;Locomotion Level;Transfers;Squat;Stairs;Bed Mobility;Bathing;Sit;Sleep;Caring for Others;Lift;Hygiene/Grooming;Dressing;Toileting    Examination-Participation Restrictions  Driving;Community Activity;Cleaning;Shop;Meal Prep;Laundry    Stability/Clinical Decision Making  Stable/Uncomplicated    Rehab Potential  Good    PT Frequency  --   3x/week for 3 weeks then 2x/week for 5 weeks   PT Duration  8 weeks    PT Treatment/Interventions  ADLs/Self Care Home Management;Aquatic Therapy;Cryotherapy;Electrical Stimulation;Moist Heat;DME Instruction;Gait training;Stair training;Functional mobility training;Therapeutic activities;Therapeutic exercise;Balance training;Neuromuscular re-education;Patient/family education;Orthotic Fit/Training;Manual techniques;Passive range of motion;Dry needling;Energy conservation;Splinting;Taping;Scar mobilization    PT Next Visit Plan  continue with a focus on quad activation, knee ROM, gait training    PT Home Exercise Plan  5/10 heel slides, knee AAROM seated flexion, quad sets; 5/17 LAQs       Patient will benefit from skilled therapeutic intervention in order to improve the following deficits and impairments:  Abnormal gait, Increased fascial restricitons, Improper body mechanics, Pain, Decreased mobility, Decreased activity tolerance, Decreased endurance, Decreased range of motion, Decreased strength, Hypomobility, Difficulty walking, Decreased knowledge of precautions, Decreased  knowledge of use of DME, Decreased scar mobility, Increased edema, Decreased skin integrity, Decreased balance  Visit Diagnosis: Chronic pain of right knee  Muscle weakness (generalized)  Difficulty in walking, not elsewhere classified     Problem List Patient Active Problem List   Diagnosis Date Noted  . S/P right TKA 06/07/2019  . History of DVT (deep vein thrombosis) 04/26/2018  . Current use of long term anticoagulation 04/26/2018  . 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 Chronic pain of right knee 10/22/2017  . Right foot pain 10/22/2017  . Neck pain 10/22/2017  . Obesity (BMI 35.0-39.9 without comorbidity) 10/22/2017  . Healthcare maintenance 10/20/2017  . History of pulmonary embolus (PE) 10/20/2017  . GERD (gastroesophageal reflux disease) 09/08/2017  . Cough 08/11/2017  . Essential hypertension 08/11/2017  . Obstructive sleep apnea 08/11/2017    12:55 PM, 06/27/19 Jerene Pitch, DPT Physical Therapy with [facility]  [phone] office   [place] [address] [place], [place], [address] Phone: [phone]   Fax:  [phone]  Name: Isabel Garcia MRN: [id number] Date of Birth: May 17, 1955

## 2019-06-29 ENCOUNTER — Other Ambulatory Visit: Payer: Self-pay

## 2019-06-29 ENCOUNTER — Ambulatory Visit (HOSPITAL_COMMUNITY): Payer: 59 | Admitting: Physical Therapy

## 2019-06-29 DIAGNOSIS — M6281 Muscle weakness (generalized): Secondary | ICD-10-CM

## 2019-06-29 DIAGNOSIS — R262 Difficulty in walking, not elsewhere classified: Secondary | ICD-10-CM

## 2019-06-29 DIAGNOSIS — M25561 Pain in right knee: Secondary | ICD-10-CM | POA: Diagnosis not present

## 2019-06-29 NOTE — Therapy (Signed)
[place], [place], [address] Phone: [phone]   Fax:  [phone]  Physical Therapy Treatment  Patient Details  Name: Isabel Garcia MRN: [id number] Date of Birth: 10-18-1955 Referring Provider (PT): Danae Orleans, [place]   Encounter Date: 06/29/2019  PT End of Session - 06/29/19 1002    Visit Number  8    Number of Visits  19    Date for PT Re-Evaluation  08/08/19   eval: 06/13/19   Authorization Type  [employer]; no auth required    Authorization - Visit Number  7    Authorization - Number of Visits  30    Progress Note Due on Visit  10    PT Start Time  0910    PT Stop Time  1000    PT Time Calculation (min)  50 min    Activity Tolerance  Patient tolerated treatment well;Patient limited by pain    Behavior During Therapy  [facility] for tasks assessed/performed       Past Medical History:  Diagnosis Date  . Achilles tendinitis   . Arthritis   . Asthma   . Diverticulitis   . Diverticulosis   . GERD (gastroesophageal reflux disease)   . Heart murmur    as a child  . History of blood clots   . History of chicken pox   . History of pulmonary embolus (PE) 2018  . Hypertension   . Liver cirrhosis secondary to NASH (nonalcoholic steatohepatitis) ([place])   . Osteoarthritis   . Phlebitis   . Plantar fasciitis   . Vertigo     Past Surgical History:  Procedure Laterality Date  . ABDOMINAL HYSTERECTOMY    . bone spur removal    . KNEE ARTHROSCOPY Right   . KNEE ARTHROSCOPY Right   . 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   Procedure: TOTAL KNEE ARTHROPLASTY;  Surgeon: Paralee Cancel, MD;  Location: [facility];  Service: Orthopedics;  Laterality: Right;  70 mins    There were no vitals filed for this visit.  Subjective Assessment - 06/29/19 0921    Subjective  pt states she is still having 5/10 pain at medial Rt knee.  States it feels like there is a tight rubberband above her knee preventing her movement.  No  other complaints today.    Currently in Pain?  Yes    Pain Score  5     Pain Location  Knee    Pain Orientation  Right;Medial                        [facility] Adult PT Treatment/Exercise - 06/29/19 0001      Ambulation/Gait   Gait Comments  with SPC X 150 feet with step through pattern      Knee/Hip Exercises: Stretches   Knee: Self-Stretch to increase Flexion  10 seconds;Limitations    Knee: Self-Stretch Limitations  10 reps onto 12" step    Gastroc Stretch  Both;3 reps;30 seconds    Gastroc Stretch Limitations  slant board      Knee/Hip Exercises: Aerobic   Stationary Bike  5; seat 12 rocking back and forth end of session not included in charges.       Knee/Hip Exercises: Standing   Knee Flexion  Right;10 reps    Other Standing Knee Exercises  tandem stance 2X30" each no UE      Knee/Hip Exercises: Supine   Knee Extension  AROM;Left  Knee Extension Limitations  8    Knee Flexion  AROM;Right    Knee Flexion Limitations  85      Manual Therapy   Manual Therapy  Myofascial release;Soft tissue mobilization    Manual therapy comments  at BOS completed separately from other skilled interventions    Soft tissue mobilization  to perimeter of Rt knee and into lateral lower LE to reduce pain.    Myofascial Release  to reduce adhesions and scar tissue along scar line and superior to knee             PT Education - 06/29/19 1001    Education Details  education and instruction with self scar massage using instrument and without .  How scar tissue plays a role in general knee tightness/dysfunction.  Gait training with [facility]       PT Short Term Goals - 06/15/19 1550      PT SHORT TERM GOAL #1   Title  Patient will demonstrate at least 10-90 degrees of right knee ROM to demonstrate improved knee mobility    Time  4    Period  Weeks    Status  On-going    Target Date  07/11/19      PT SHORT TERM GOAL #2   Title  Patient will report at least 25% improvement in  overall symptoms and/or functional ability.    Time  4    Period  Weeks    Status  On-going    Target Date  07/11/19      PT SHORT TERM GOAL #3   Title  Patient will be independent in self management strategies to improve quality of life and functional outcomes.    Time  4    Period  Weeks    Status  On-going    Target Date  07/11/19        PT Long Term Goals - 06/15/19 1551      PT LONG TERM GOAL #1   Title  Patient will be able to ascend and descend stairs with reciprocal gait pattern and use of railing as needed to improve ability to ascend and descend steps at home.    Time  8    Period  Weeks    Status  On-going      PT LONG TERM GOAL #2   Title  Patient will demonstrate 2-120 degrees of right knee ROM to demosntrate improved knee mobility    Time  8    Period  Weeks    Status  On-going      PT LONG TERM GOAL #3   Title  Patient will report at least 50% improvement in overall symptoms and/or functional ability.    Time  8    Period  Weeks    Status  On-going      PT LONG TERM GOAL #4   Title  Patient will be able to ambulate 2 minutes without major gait deviations and without assistive device to return to prior level of function    Time  8    Period  Weeks    Status  On-going            Plan - 06/29/19 1040    Clinical Impression Statement  Continued with primary focus on Rt knee ROM.  Noted very tight restrictions perimeter of knee and throughout scar.  Instructed with self scar massage and completed this following bike.  Able to reduce restrictions and improve tissue movement.  AROM following 8-85 today in supine. Began gait with SPC with pt demonstrating correct technique and minimal cues.  No LOB using cane. Able to maintain tandem stance full 30" without LOB as well.  Pt reported reduction of pain to 3/10 at end of session.    Personal Factors and Comorbidities  Time since onset of injury/illness/exacerbation;Comorbidity 2;Fitness    Comorbidities  Hx of  DVT, HTN    Examination-Activity Limitations  Stand;Locomotion Level;Transfers;Squat;Stairs;Bed Mobility;Bathing;Sit;Sleep;Caring for Others;Lift;Hygiene/Grooming;Dressing;Toileting    Examination-Participation Restrictions  Driving;Community Activity;Cleaning;Shop;Meal Prep;Laundry    Stability/Clinical Decision Making  Stable/Uncomplicated    Rehab Potential  Good    PT Frequency  --   3x/week for 3 weeks then 2x/week for 5 weeks   PT Duration  8 weeks    PT Treatment/Interventions  ADLs/Self Care Home Management;Aquatic Therapy;Cryotherapy;Electrical Stimulation;Moist Heat;DME Instruction;Gait training;Stair training;Functional mobility training;Therapeutic activities;Therapeutic exercise;Balance training;Neuromuscular re-education;Patient/family education;Orthotic Fit/Training;Manual techniques;Passive range of motion;Dry needling;Energy conservation;Splinting;Taping;Scar mobilization    PT Next Visit Plan  continue with a focus on improving Rt knee AROM, gait with LRAD and reduction of scar tissue through manual technques.    PT Home Exercise Plan  5/10 heel slides, knee AAROM seated flexion, quad sets; 5/17 LAQs       Patient will benefit from skilled therapeutic intervention in order to improve the following deficits and impairments:  Abnormal gait, Increased fascial restricitons, Improper body mechanics, Pain, Decreased mobility, Decreased activity tolerance, Decreased endurance, Decreased range of motion, Decreased strength, Hypomobility, Difficulty walking, Decreased knowledge of precautions, Decreased knowledge of use of DME, Decreased scar mobility, Increased edema, Decreased skin integrity, Decreased balance  Visit Diagnosis: Chronic pain of right knee  Muscle weakness (generalized)  Difficulty in walking, not elsewhere classified     Problem List Patient Active Problem List   Diagnosis Date Noted  . S/P right TKA 06/07/2019  . History of DVT (deep vein thrombosis)  04/26/2018  . Current use of long term anticoagulation 04/26/2018  . 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 Chronic pain of right knee 10/22/2017  . Right foot pain 10/22/2017  . Neck pain 10/22/2017  . Obesity (BMI 35.0-39.9 without comorbidity) 10/22/2017  . Healthcare maintenance 10/20/2017  . History of pulmonary embolus (PE) 10/20/2017  . GERD (gastroesophageal reflux disease) 09/08/2017  . Cough 08/11/2017  . Essential hypertension 08/11/2017  . Obstructive sleep apnea 08/11/2017   Teena Irani, PTA/CLT [phone]  Teena Irani 06/29/2019, 10:44 AM  [place], [place], [address] Phone: [phone]   Fax:  [phone]  Name: Isabel Garcia MRN: [id number] Date of Birth: [birth year]

## 2019-07-01 ENCOUNTER — Other Ambulatory Visit: Payer: Self-pay | Admitting: Pulmonary Disease

## 2019-07-01 ENCOUNTER — Telehealth: Payer: Self-pay | Admitting: Pulmonary Disease

## 2019-07-01 ENCOUNTER — Encounter (HOSPITAL_COMMUNITY): Payer: 59

## 2019-07-01 DIAGNOSIS — Z86711 Personal history of pulmonary embolism: Secondary | ICD-10-CM

## 2019-07-01 NOTE — Telephone Encounter (Signed)
Called and spoke with pt. Stated to pt that we should get her scheduled for a f/u with VS per last OV and she verbalized understanding. OV has been scheduled for pt at [facility] office with Dr. Halford Chessman. Pt's Eliquis was also refilled at pt's request. Nothing further needed.

## 2019-07-05 ENCOUNTER — Encounter (HOSPITAL_COMMUNITY): Payer: Self-pay | Admitting: Physical Therapy

## 2019-07-05 ENCOUNTER — Ambulatory Visit (HOSPITAL_COMMUNITY): Payer: 59 | Attending: Orthopedic Surgery | Admitting: Physical Therapy

## 2019-07-05 ENCOUNTER — Other Ambulatory Visit: Payer: Self-pay

## 2019-07-05 DIAGNOSIS — G8929 Other chronic pain: Secondary | ICD-10-CM | POA: Diagnosis present

## 2019-07-05 DIAGNOSIS — M25561 Pain in right knee: Secondary | ICD-10-CM | POA: Insufficient documentation

## 2019-07-05 DIAGNOSIS — M25672 Stiffness of left ankle, not elsewhere classified: Secondary | ICD-10-CM | POA: Diagnosis present

## 2019-07-05 DIAGNOSIS — M6281 Muscle weakness (generalized): Secondary | ICD-10-CM | POA: Diagnosis present

## 2019-07-05 DIAGNOSIS — R262 Difficulty in walking, not elsewhere classified: Secondary | ICD-10-CM

## 2019-07-05 NOTE — Therapy (Signed)
[facility] [address] [place], [place], [address] Phone: [phone]   Fax:  [phone]  Physical Therapy Treatment and Progress Note  Patient Details  Name: Isabel Garcia MRN: [id number] Date of Birth: 04-Sep-1955 Referring Provider (PT): Danae Orleans, PA  Progress Note Reporting Period 06/13/19 to 07/05/19  See note below for Objective Data and Assessment of Progress/Goals.       Encounter Date: 07/05/2019  PT End of Session - 07/05/19 1038    Visit Number  9    Number of Visits  19    Date for PT Re-Evaluation  08/08/19   eval: 06/13/19   Authorization Type  [employer]; no auth required [id number]    Authorization - Visit Number  10    Authorization - Number of Visits  30    Progress Note Due on Visit  19    PT Start Time  1039    PT Stop Time  1125    PT Time Calculation (min)  46 min    Activity Tolerance  Patient tolerated treatment well;Patient limited by pain    Behavior During Therapy  WFL for tasks assessed/performed       Past Medical History:  Diagnosis Date  . Achilles tendinitis   . Arthritis   . Asthma   . Diverticulitis   . Diverticulosis   . GERD (gastroesophageal reflux disease)   . Heart murmur    as a child  . History of blood clots   . History of chicken pox   . History of pulmonary embolus (PE) 2018  . Hypertension   . Liver cirrhosis secondary to NASH (nonalcoholic steatohepatitis) ([place])   . Osteoarthritis   . Phlebitis   . Plantar fasciitis   . Vertigo     Past Surgical History:  Procedure Laterality Date  . ABDOMINAL HYSTERECTOMY    . bone spur removal    . KNEE ARTHROSCOPY Right   . KNEE ARTHROSCOPY Right   . 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   Procedure: TOTAL KNEE ARTHROPLASTY;  Surgeon: Paralee Cancel, MD;  Location: [facility];  Service: Orthopedics;  Laterality: Right;  70 mins    There were no vitals filed for this visit.  Subjective Assessment - 07/05/19 1041    Subjective   States every morning she wakes up and feels like she is starting all over again, feels like it is glued. States that it is sore but not as painful. States that her pain is the same as it was prior to the surgery. States she feels like she has swelling in the back of her knee and that's what she had beforehand too.    Currently in Pain?  Yes    Pain Score  3     Pain Location  Knee    Pain Orientation  Right;Medial         [facility] PT Assessment - 07/05/19 0001      Assessment   Medical Diagnosis  R TKA    Referring Provider (PT)  Danae Orleans, PA    Onset Date/Surgical Date  06/07/19    Next MD Visit  07/20/19      Observation/Other Assessments   Focus on Therapeutic Outcomes (FOTO)   64% limited    was 93% limited     Transfers   Transfers  Sit to Stand;Stand to Sit    Sit to Stand  6: Modified independent (Device/Increase time);With upper extremity assist   with leg kicked out (  R)   Stand to Sit  6: Modified independent (Device/Increase time);With armrests;With upper extremity assist   with leg kicked out (R)     Ambulation/Gait   Ambulation/Gait  Yes    Ambulation/Gait Assistance  6: Modified independent (Device/Increase time);5: Supervision    Ambulation Distance (Feet)  140 Feet    Assistive device  Rolling walker    Gait Pattern  Step-through pattern;Decreased hip/knee flexion - right;Decreased dorsiflexion - right;Decreased weight shift to right    Gait Comments  2MW; 4:36 to complete 263fet                    [facility] Adult PT Treatment/Exercise - 07/05/19 0001      Knee/Hip Exercises: Supine   Heel Slides  AROM;Right;15 reps    Knee Extension  AROM;Right    Knee Extension Limitations  10    Knee Flexion  AROM;Right    Knee Flexion Limitations  96      Manual Therapy   Manual Therapy  Manual Traction;Soft tissue mobilization;Joint mobilization    Manual therapy comments  all manual therapy completed separately from other skilled interventions    Joint  Mobilization  grade I/II tib fib mobilizatio R into flexion     Soft tissue mobilization  to R adductors, post tib and gastroc - tolerated well    Manual Traction  to R knee in flexion and extension - tolerated extension well burning pain abolished. gentle traction              PT Education - 07/05/19 1221    Education Details  on current condition, progress and FOTO scores    Person(s) Educated  Patient    Methods  Explanation    Comprehension  Verbalized understanding       PT Short Term Goals - 07/05/19 1211      PT SHORT TERM GOAL #1   Title  Patient will demonstrate at least 10-90 degrees of right knee ROM to demonstrate improved knee mobility    Baseline  10-96    Time  4    Period  Weeks    Status  Achieved    Target Date  07/11/19      PT SHORT TERM GOAL #2   Title  Patient will report at least 25% improvement in overall symptoms and/or functional ability.    Baseline  60% better    Time  4    Period  Weeks    Status  Achieved    Target Date  07/11/19      PT SHORT TERM GOAL #3   Title  Patient will be independent in self management strategies to improve quality of life and functional outcomes.    Time  4    Period  Weeks    Status  On-going    Target Date  07/11/19        PT Long Term Goals - 07/05/19 1212      PT LONG TERM GOAL #1   Title  Patient will be able to ascend and descend stairs with reciprocal gait pattern and use of railing as needed to improve ability to ascend and descend steps at home.    Time  8    Period  Weeks    Status  On-going      PT LONG TERM GOAL #2   Title  Patient will demonstrate 2-120 degrees of right knee ROM to demosntrate improved knee mobility    Baseline  10-96  Time  8    Period  Weeks    Status  On-going      PT LONG TERM GOAL #3   Title  Patient will report at least 50% improvement in overall symptoms and/or functional ability.    Baseline  60%    Time  8    Period  Weeks    Status  On-going      PT  LONG TERM GOAL #4   Title  Patient will be able to ambulate 2 minutes without major gait deviations and without assistive device to return to prior level of function    Time  8    Period  Weeks    Status  On-going            Plan - 07/05/19 1103    Clinical Impression Statement  Patient present for a progress note. She has made good progress in ROM, functional mobility and overall FOTO score, but continues to have limitations in motion and mobility. Pain limiting ability to bend knee, but pain resolved after gentle long axis traction of the knee. Will Continue to focus on knee ROM and overall mobility as tolerated. 2/3 short term goals met at this time.    Personal Factors and Comorbidities  Time since onset of injury/illness/exacerbation;Comorbidity 2;Fitness    Comorbidities  Hx of DVT, HTN    Examination-Activity Limitations  Stand;Locomotion Level;Transfers;Squat;Stairs;Bed Mobility;Bathing;Sit;Sleep;Caring for Others;Lift;Hygiene/Grooming;Dressing;Toileting    Examination-Participation Restrictions  Driving;Community Activity;Cleaning;Shop;Meal Prep;Laundry    Stability/Clinical Decision Making  Stable/Uncomplicated    Rehab Potential  Good    PT Frequency  --   3x/week for 3 weeks then 2x/week for 5 weeks   PT Duration  8 weeks    PT Treatment/Interventions  ADLs/Self Care Home Management;Aquatic Therapy;Cryotherapy;Electrical Stimulation;Moist Heat;DME Instruction;Gait training;Stair training;Functional mobility training;Therapeutic activities;Therapeutic exercise;Balance training;Neuromuscular re-education;Patient/family education;Orthotic Fit/Training;Manual techniques;Passive range of motion;Dry needling;Energy conservation;Splinting;Taping;Scar mobilization    PT Next Visit Plan  continue with a focus on improving Rt knee AROM, gait with LRAD and reduction of scar tissue through manual technques.    PT Home Exercise Plan  5/10 heel slides, knee AAROM seated flexion, quad sets;  5/17 LAQs       Patient will benefit from skilled therapeutic intervention in order to improve the following deficits and impairments:  Abnormal gait, Increased fascial restricitons, Improper body mechanics, Pain, Decreased mobility, Decreased activity tolerance, Decreased endurance, Decreased range of motion, Decreased strength, Hypomobility, Difficulty walking, Decreased knowledge of precautions, Decreased knowledge of use of DME, Decreased scar mobility, Increased edema, Decreased skin integrity, Decreased balance  Visit Diagnosis: Chronic pain of right knee  Muscle weakness (generalized)  Difficulty in walking, not elsewhere classified     Problem List Patient Active Problem List   Diagnosis Date Noted  . S/P right TKA 06/07/2019  . History of DVT (deep vein thrombosis) 04/26/2018  . Current use of long term anticoagulation 04/26/2018  . 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 Chronic pain of right knee 10/22/2017  . Right foot pain 10/22/2017  . Neck pain 10/22/2017  . Obesity (BMI 35.0-39.9 without comorbidity) 10/22/2017  . Healthcare maintenance 10/20/2017  . History of pulmonary embolus (PE) 10/20/2017  . GERD (gastroesophageal reflux disease) 09/08/2017  . Cough 08/11/2017  . Essential hypertension 08/11/2017  . Obstructive sleep apnea 08/11/2017  12:32 PM, 07/05/19 Jerene Pitch, DPT Physical Therapy with [facility]  [phone] office  [place], [place],  [place] Phone: [phone]   Fax:  [phone]  Name: Isabel Garcia MRN: [id number] Date of Birth: 01-27-1956

## 2019-07-07 ENCOUNTER — Other Ambulatory Visit: Payer: Self-pay

## 2019-07-07 ENCOUNTER — Ambulatory Visit (HOSPITAL_COMMUNITY): Payer: 59

## 2019-07-07 DIAGNOSIS — R262 Difficulty in walking, not elsewhere classified: Secondary | ICD-10-CM

## 2019-07-07 DIAGNOSIS — M25561 Pain in right knee: Secondary | ICD-10-CM | POA: Diagnosis not present

## 2019-07-07 DIAGNOSIS — G8929 Other chronic pain: Secondary | ICD-10-CM

## 2019-07-07 DIAGNOSIS — M6281 Muscle weakness (generalized): Secondary | ICD-10-CM

## 2019-07-07 NOTE — Therapy (Signed)
[facility] [place], [place], [address] Phone: [phone]   Fax:  [phone]  Physical Therapy Treatment  Patient Details  Name: Isabel Garcia MRN: [id number] Date of Birth: [birth year] Referring Provider (PT): Danae Orleans, [place]   Encounter Date: 07/07/2019  PT End of Session - 07/07/19 1102    Visit Number  10    Number of Visits  19    Date for PT Re-Evaluation  08/08/19   Eval: 06/13/19; PN:07/05/19   Authorization Type  [employer]; no auth required [id number]    Authorization - Visit Number  11    Authorization - Number of Visits  30    Progress Note Due on Visit  19    PT Start Time  1053    PT Stop Time  1142   4' on bike, not included in charges   PT Time Calculation (min)  49 min    Activity Tolerance  Patient tolerated treatment well    Behavior During Therapy  [facility] for tasks assessed/performed       Past Medical History:  Diagnosis Date  . Achilles tendinitis   . Arthritis   . Asthma   . Diverticulitis   . Diverticulosis   . GERD (gastroesophageal reflux disease)   . Heart murmur    as a child  . History of blood clots   . History of chicken pox   . History of pulmonary embolus (PE) 2018  . Hypertension   . Liver cirrhosis secondary to NASH (nonalcoholic steatohepatitis) ([place])   . Osteoarthritis   . Phlebitis   . Plantar fasciitis   . Vertigo     Past Surgical History:  Procedure Laterality Date  . ABDOMINAL HYSTERECTOMY    . bone spur removal    . KNEE ARTHROSCOPY Right   . KNEE ARTHROSCOPY Right   . 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   Procedure: TOTAL KNEE ARTHROPLASTY;  Surgeon: Paralee Cancel, MD;  Location: [facility];  Service: Orthopedics;  Laterality: Right;  70 mins    There were no vitals filed for this visit.  Subjective Assessment - 07/07/19 1100    Subjective  Pt arrived with SPC, reports yesterday was a bad day, can tell its supposed to rain today.  Current pain scale 3/10.     Currently in Pain?  Yes    Pain Score  3     Pain Location  Knee    Pain Orientation  Right;Medial    Pain Descriptors / Indicators  Aching;Tightness    Pain Type  Acute pain    Pain Onset  More than a month ago    Pain Frequency  Constant    Aggravating Factors   standing    Pain Relieving Factors  ice    Effect of Pain on Daily Activities  severly limits                        [facility] Adult PT Treatment/Exercise - 07/07/19 0001      Ambulation/Gait   Ambulation/Gait  Yes    Ambulation/Gait Assistance  5: Supervision    Ambulation Distance (Feet)  226 Feet    Assistive device  Straight cane    Gait Pattern  Step-through pattern;Decreased hip/knee flexion - right;Decreased dorsiflexion - right;Decreased weight shift to right    Gait Comments  min cueing for heel strike      Exercises   Exercises  Knee/Hip  Knee/Hip Exercises: Stretches   Active Hamstring Stretch  Right;3 reps;30 seconds    Active Hamstring Stretch Limitations  12in step    Knee: Self-Stretch to increase Flexion  10 seconds;Limitations    Knee: Self-Stretch Limitations  10 reps onto 12" step    Gastroc Stretch  Both;3 reps;30 seconds    Gastroc Stretch Limitations  slant board      Knee/Hip Exercises: Aerobic   Stationary Bike  4'; seat 12 rocking back and forth end of session not included in charges.       Knee/Hip Exercises: Standing   Terminal Knee Extension  Right;10 reps    Theraband Level (Terminal Knee Extension)  Other (comment)    Terminal Knee Extension Limitations  pink ball against wall- HEP      Knee/Hip Exercises: Supine   Short Arc Quad Sets  Right;10 reps    Short Arc Quad Sets Limitations  5" holds    Knee Extension  AROM;Right    Knee Flexion  AROM;Right    Knee Flexion Limitations  97      Manual Therapy   Manual Therapy  Manual Lymphatic Drainage (MLD);Myofascial release    Manual therapy comments  all manual therapy completed separately from other skilled  interventions    Myofascial Release  to reduce adhesions and scar tissue along scar line and superior to knee    Manual Lymphatic Drainage (MLD)  decongitive massage for edema control with elevated              PT Education - 07/07/19 1141    Education Details  Pt instructed scar tissue massage to begin at home, added TKE to HEP.    Person(s) Educated  Patient    Methods  Explanation;Handout    Comprehension  Verbalized understanding       PT Short Term Goals - 07/05/19 1211      PT SHORT TERM GOAL #1   Title  Patient will demonstrate at least 10-90 degrees of right knee ROM to demonstrate improved knee mobility    Baseline  10-96    Time  4    Period  Weeks    Status  Achieved    Target Date  07/11/19      PT SHORT TERM GOAL #2   Title  Patient will report at least 25% improvement in overall symptoms and/or functional ability.    Baseline  60% better    Time  4    Period  Weeks    Status  Achieved    Target Date  07/11/19      PT SHORT TERM GOAL #3   Title  Patient will be independent in self management strategies to improve quality of life and functional outcomes.    Time  4    Period  Weeks    Status  On-going    Target Date  07/11/19        PT Long Term Goals - 07/05/19 1212      PT LONG TERM GOAL #1   Title  Patient will be able to ascend and descend stairs with reciprocal gait pattern and use of railing as needed to improve ability to ascend and descend steps at home.    Time  8    Period  Weeks    Status  On-going      PT LONG TERM GOAL #2   Title  Patient will demonstrate 2-120 degrees of right knee ROM to demosntrate improved knee mobility  Baseline  10-96    Time  8    Period  Weeks    Status  On-going      PT LONG TERM GOAL #3   Title  Patient will report at least 50% improvement in overall symptoms and/or functional ability.    Baseline  60%    Time  8    Period  Weeks    Status  On-going      PT LONG TERM GOAL #4   Title  Patient  will be able to ambulate 2 minutes without major gait deviations and without assistive device to return to prior level of function    Time  8    Period  Weeks    Status  On-going            Plan - 07/07/19 1201    Clinical Impression Statement  Manual decongitive massage complete for edema and pain control prior ROM based exercises.  Pt educated on scar tissue mobilization techniques added to HEP as well as TKE based exercises.  AROM 8-97 degrees.  No reports of increased pain through session.    Personal Factors and Comorbidities  Time since onset of injury/illness/exacerbation;Comorbidity 2;Fitness    Comorbidities  Hx of DVT, HTN    Examination-Activity Limitations  Stand;Locomotion Level;Transfers;Squat;Stairs;Bed Mobility;Bathing;Sit;Sleep;Caring for Others;Lift;Hygiene/Grooming;Dressing;Toileting    Examination-Participation Restrictions  Driving;Community Activity;Cleaning;Shop;Meal Prep;Laundry    Stability/Clinical Decision Making  Stable/Uncomplicated    Clinical Decision Making  Low    Rehab Potential  Good    PT Frequency  --   3x/week for 3 weeks then 2x/week for 5 weeks   PT Duration  8 weeks    PT Treatment/Interventions  ADLs/Self Care Home Management;Aquatic Therapy;Cryotherapy;Electrical Stimulation;Moist Heat;DME Instruction;Gait training;Stair training;Functional mobility training;Therapeutic activities;Therapeutic exercise;Balance training;Neuromuscular re-education;Patient/family education;Orthotic Fit/Training;Manual techniques;Passive range of motion;Dry needling;Energy conservation;Splinting;Taping;Scar mobilization    PT Next Visit Plan  continue with a focus on improving Rt knee AROM, gait with LRAD and reduction of scar tissue through manual technques.    PT Home Exercise Plan  5/10 heel slides, knee AAROM seated flexion, quad sets; 5/17 LAQs; 07/06/19: TKE ball against wall, SAQ, scar tissue massage       Patient will benefit from skilled therapeutic  intervention in order to improve the following deficits and impairments:  Abnormal gait, Increased fascial restricitons, Improper body mechanics, Pain, Decreased mobility, Decreased activity tolerance, Decreased endurance, Decreased range of motion, Decreased strength, Hypomobility, Difficulty walking, Decreased knowledge of precautions, Decreased knowledge of use of DME, Decreased scar mobility, Increased edema, Decreased skin integrity, Decreased balance  Visit Diagnosis: Chronic pain of right knee  Muscle weakness (generalized)  Difficulty in walking, not elsewhere classified     Problem List Patient Active Problem List   Diagnosis Date Noted  . S/P right TKA 06/07/2019  . History of DVT (deep vein thrombosis) 04/26/2018  . Current use of long term anticoagulation 04/26/2018  . 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 Chronic pain of right knee 10/22/2017  . Right foot pain 10/22/2017  . Neck pain 10/22/2017  . Obesity (BMI 35.0-39.9 without comorbidity) 10/22/2017  . Healthcare maintenance 10/20/2017  . History of pulmonary embolus (PE) 10/20/2017  . GERD (gastroesophageal reflux disease) 09/08/2017  . Cough 08/11/2017  . Essential hypertension 08/11/2017  . Obstructive sleep apnea 08/11/2017   Ihor Austin, LPTA/CLT; CBIS [phone]  Aldona Lento 07/07/2019, 12:10 PM  [facility] [address] [place], [place], [address] Phone: [phone]  Fax:  [phone]  Name: JASLEN ADCOX MRN: [id number] Date of Birth: 07-21-1955

## 2019-07-08 ENCOUNTER — Encounter (HOSPITAL_COMMUNITY): Payer: 59

## 2019-07-11 ENCOUNTER — Ambulatory Visit (HOSPITAL_COMMUNITY): Payer: 59 | Admitting: Physical Therapy

## 2019-07-13 ENCOUNTER — Ambulatory Visit (HOSPITAL_COMMUNITY): Payer: 59 | Admitting: Physical Therapy

## 2019-07-13 ENCOUNTER — Ambulatory Visit: Payer: 59 | Admitting: Gastroenterology

## 2019-07-13 ENCOUNTER — Other Ambulatory Visit: Payer: Self-pay

## 2019-07-13 ENCOUNTER — Encounter (HOSPITAL_COMMUNITY): Payer: Self-pay | Admitting: Physical Therapy

## 2019-07-13 DIAGNOSIS — M25672 Stiffness of left ankle, not elsewhere classified: Secondary | ICD-10-CM

## 2019-07-13 DIAGNOSIS — G8929 Other chronic pain: Secondary | ICD-10-CM

## 2019-07-13 DIAGNOSIS — M25561 Pain in right knee: Secondary | ICD-10-CM | POA: Diagnosis not present

## 2019-07-13 DIAGNOSIS — M6281 Muscle weakness (generalized): Secondary | ICD-10-CM

## 2019-07-13 DIAGNOSIS — R262 Difficulty in walking, not elsewhere classified: Secondary | ICD-10-CM

## 2019-07-13 NOTE — Therapy (Signed)
[place], [place], [address] Phone: [phone]   Fax:  [phone]  Physical Therapy Treatment  Patient Details  Name: Isabel Garcia MRN: [id number] Date of Birth: 1956/01/26 Referring Provider (PT): Danae Orleans, [place]   Encounter Date: 07/13/2019  PT End of Session - 07/13/19 1009    Visit Number  11    Number of Visits  19    Date for PT Re-Evaluation  08/08/19   Eval: 06/13/19; PN:07/05/19   Authorization Type  [employer]; no auth required [id number]    Authorization - Visit Number  12    Authorization - Number of Visits  30    Progress Note Due on Visit  19    PT Start Time  1001    PT Stop Time  1040    PT Time Calculation (min)  39 min    Activity Tolerance  Patient tolerated treatment well    Behavior During Therapy  [facility] for tasks assessed/performed       Past Medical History:  Diagnosis Date  . Achilles tendinitis   . Arthritis   . Asthma   . Diverticulitis   . Diverticulosis   . GERD (gastroesophageal reflux disease)   . Heart murmur    as a child  . History of blood clots   . History of chicken pox   . History of pulmonary embolus (PE) 2018  . Hypertension   . Liver cirrhosis secondary to NASH (nonalcoholic steatohepatitis) ([place])   . Osteoarthritis   . Phlebitis   . Plantar fasciitis   . Vertigo     Past Surgical History:  Procedure Laterality Date  . ABDOMINAL HYSTERECTOMY    . bone spur removal    . KNEE ARTHROSCOPY Right   . KNEE ARTHROSCOPY Right   . 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   Procedure: TOTAL KNEE ARTHROPLASTY;  Surgeon: Paralee Cancel, MD;  Location: [facility];  Service: Orthopedics;  Laterality: Right;  70 mins    There were no vitals filed for this visit.  Subjective Assessment - 07/13/19 1008    Subjective  State she has been doing her exercises. States it is better then last time.    Currently in Pain?  Yes    Pain Score  2     Pain Location  Knee    Pain  Orientation  Right;Medial    Pain Descriptors / Indicators  Aching;Tightness    Pain Onset  More than a month ago         [facility] PT Assessment - 07/13/19 0001      Assessment   Medical Diagnosis  R TKA    Referring Provider (PT)  Danae Orleans, PA    Onset Date/Surgical Date  06/07/19    Next MD Visit  07/20/19                    [facility] Adult PT Treatment/Exercise - 07/13/19 0001      Knee/Hip Exercises: Standing   Other Standing Knee Exercises  TKE walking at pulley machine - #2 plate R  - with holds in TKES - 2x5       Knee/Hip Exercises: Supine   Straight Leg Raises  AROM;Right;1 set;15 reps   PT assist for TKE   Knee Extension  AROM;Right    Knee Extension Limitations  10    Knee Flexion  AROM;Right    Knee Flexion Limitations  100  Knee/Hip Exercises: Prone   Hamstring Curl  3 sets;5 reps;5 seconds    Other Prone Exercises  extension stretch 2 minutes; knee flexion stretch AAROM - 2x 60"      Manual Therapy   Manual Therapy  Soft tissue mobilization;Joint mobilization    Manual therapy comments  all manual therapy completed separately from other skilled interventions    Joint Mobilization  grade I/II tib fib mobilizatio R into extension, patella mobilizations in inferior direction     Soft tissue mobilization  R distal hamstrings and proximal gastroc - with                PT Short Term Goals - 07/05/19 1211      PT SHORT TERM GOAL #1   Title  Patient will demonstrate at least 10-90 degrees of right knee ROM to demonstrate improved knee mobility    Baseline  10-96    Time  4    Period  Weeks    Status  Achieved    Target Date  07/11/19      PT SHORT TERM GOAL #2   Title  Patient will report at least 25% improvement in overall symptoms and/or functional ability.    Baseline  60% better    Time  4    Period  Weeks    Status  Achieved    Target Date  07/11/19      PT SHORT TERM GOAL #3   Title  Patient will be independent in self  management strategies to improve quality of life and functional outcomes.    Time  4    Period  Weeks    Status  On-going    Target Date  07/11/19        PT Long Term Goals - 07/05/19 1212      PT LONG TERM GOAL #1   Title  Patient will be able to ascend and descend stairs with reciprocal gait pattern and use of railing as needed to improve ability to ascend and descend steps at home.    Time  8    Period  Weeks    Status  On-going      PT LONG TERM GOAL #2   Title  Patient will demonstrate 2-120 degrees of right knee ROM to demosntrate improved knee mobility    Baseline  10-96    Time  8    Period  Weeks    Status  On-going      PT LONG TERM GOAL #3   Title  Patient will report at least 50% improvement in overall symptoms and/or functional ability.    Baseline  60%    Time  8    Period  Weeks    Status  On-going      PT LONG TERM GOAL #4   Title  Patient will be able to ambulate 2 minutes without major gait deviations and without assistive device to return to prior level of function    Time  8    Period  Weeks    Status  On-going            Plan - 07/13/19 1010    Clinical Impression Statement  Focused on knee mobility today. Knee extension initially limited compared to previous sessions. Able to get to 10 degrees lack from full knee extension with manual work. Improvement in knee flexion noted. Added walking TKE and these were tolerated well will follow up with next session.    Personal  Factors and Comorbidities  Time since onset of injury/illness/exacerbation;Comorbidity 2;Fitness    Comorbidities  Hx of DVT, HTN    Examination-Activity Limitations  Stand;Locomotion Level;Transfers;Squat;Stairs;Bed Mobility;Bathing;Sit;Sleep;Caring for Others;Lift;Hygiene/Grooming;Dressing;Toileting    Examination-Participation Restrictions  Driving;Community Activity;Cleaning;Shop;Meal Prep;Laundry    Stability/Clinical Decision Making  Stable/Uncomplicated    Rehab Potential   Good    PT Frequency  --   3x/week for 3 weeks then 2x/week for 5 weeks   PT Duration  8 weeks    PT Treatment/Interventions  ADLs/Self Care Home Management;Aquatic Therapy;Cryotherapy;Electrical Stimulation;Moist Heat;DME Instruction;Gait training;Stair training;Functional mobility training;Therapeutic activities;Therapeutic exercise;Balance training;Neuromuscular re-education;Patient/family education;Orthotic Fit/Training;Manual techniques;Passive range of motion;Dry needling;Energy conservation;Splinting;Taping;Scar mobilization    PT Next Visit Plan  MD apt after this session, TKE walking, continue with a focus on improving Rt knee AROM, gait with LRAD and reduction of scar tissue through manual technques.    PT Home Exercise Plan  5/10 heel slides, knee AAROM seated flexion, quad sets; 5/17 LAQs; 07/06/19: TKE ball against wall, SAQ, scar tissue massage       Patient will benefit from skilled therapeutic intervention in order to improve the following deficits and impairments:  Abnormal gait, Increased fascial restricitons, Improper body mechanics, Pain, Decreased mobility, Decreased activity tolerance, Decreased endurance, Decreased range of motion, Decreased strength, Hypomobility, Difficulty walking, Decreased knowledge of precautions, Decreased knowledge of use of DME, Decreased scar mobility, Increased edema, Decreased skin integrity, Decreased balance  Visit Diagnosis: Chronic pain of right knee  Muscle weakness (generalized)  Difficulty in walking, not elsewhere classified  Stiffness of left ankle, not elsewhere classified     Problem List Patient Active Problem List   Diagnosis Date Noted  . S/P right TKA 06/07/2019  . History of DVT (deep vein thrombosis) 04/26/2018  . Current use of long term anticoagulation 04/26/2018  . 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 Chronic pain of right knee 10/22/2017  . Right foot pain 10/22/2017  . Neck pain  10/22/2017  . Obesity (BMI 35.0-39.9 without comorbidity) 10/22/2017  . Healthcare maintenance 10/20/2017  . History of pulmonary embolus (PE) 10/20/2017  . GERD (gastroesophageal reflux disease) 09/08/2017  . Cough 08/11/2017  . Essential hypertension 08/11/2017  . Obstructive sleep apnea 08/11/2017   10:43 AM, 07/13/19 Jerene Pitch, DPT Physical Therapy with [facility]  [phone] office  [place] [address] [place], [place], [address] Phone: [phone]   Fax:  [phone]  Name: Isabel Garcia MRN: [id number] Date of Birth: 11/24/1955

## 2019-07-15 ENCOUNTER — Encounter (HOSPITAL_COMMUNITY): Payer: 59 | Admitting: Physical Therapy

## 2019-07-18 ENCOUNTER — Encounter (HOSPITAL_COMMUNITY): Payer: Self-pay | Admitting: Physical Therapy

## 2019-07-18 ENCOUNTER — Other Ambulatory Visit: Payer: Self-pay

## 2019-07-18 ENCOUNTER — Ambulatory Visit (HOSPITAL_COMMUNITY): Payer: 59 | Admitting: Physical Therapy

## 2019-07-18 DIAGNOSIS — M6281 Muscle weakness (generalized): Secondary | ICD-10-CM

## 2019-07-18 DIAGNOSIS — G8929 Other chronic pain: Secondary | ICD-10-CM

## 2019-07-18 DIAGNOSIS — R262 Difficulty in walking, not elsewhere classified: Secondary | ICD-10-CM

## 2019-07-18 DIAGNOSIS — M25561 Pain in right knee: Secondary | ICD-10-CM | POA: Diagnosis not present

## 2019-07-18 NOTE — Therapy (Signed)
[place], [place], [address] Phone: [phone]   Fax:  [phone]  Physical Therapy Treatment  Patient Details  Name: Isabel Garcia MRN: [id number] Date of Birth: 02-Apr-1955 Referring Provider (PT): Danae Orleans, [place]   Encounter Date: 07/18/2019   PT End of Session - 07/18/19 1032    Visit Number 12    Number of Visits 19    Date for PT Re-Evaluation 08/08/19   Eval: 06/13/19; PN:07/05/19   Authorization Type [employer]; no auth required [id number]    Authorization - Visit Number 13    Authorization - Number of Visits 30    Progress Note Due on Visit 19    PT Start Time 1001    PT Stop Time 1042    PT Time Calculation (min) 41 min    Activity Tolerance Patient tolerated treatment well    Behavior During Therapy [facility] for tasks assessed/performed           Past Medical History:  Diagnosis Date  . Achilles tendinitis   . Arthritis   . Asthma   . Diverticulitis   . Diverticulosis   . GERD (gastroesophageal reflux disease)   . Heart murmur    as a child  . History of blood clots   . History of chicken pox   . History of pulmonary embolus (PE) 2018  . Hypertension   . Liver cirrhosis secondary to NASH (nonalcoholic steatohepatitis) ([place])   . Osteoarthritis   . Phlebitis   . Plantar fasciitis   . Vertigo     Past Surgical History:  Procedure Laterality Date  . ABDOMINAL HYSTERECTOMY    . bone spur removal    . KNEE ARTHROSCOPY Right   . KNEE ARTHROSCOPY Right   . 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   Procedure: TOTAL KNEE ARTHROPLASTY;  Surgeon: Paralee Cancel, MD;  Location: [facility];  Service: Orthopedics;  Laterality: Right;  70 mins    There were no vitals filed for this visit.   Subjective Assessment - 07/18/19 1027    Subjective Patient reports that on Friday she was doing her stretches in prone and she had a burning pain along the front of the incision. When she looked at her knee she saw  there were tiny blisters that seems to open up. Called MD who put her on antibiotics and told her to back off. Had a friend who is a nurse come look at in and said it did not look infected. She has been keeping it wrapped and seems MD on Wednesday.    Currently in Pain? Yes    Pain Location Knee    Pain Orientation Right    Pain Descriptors / Indicators Burning;Aching    Pain Onset More than a month ago              [facility] PT Assessment - 07/18/19 0001      Assessment   Medical Diagnosis R TKA    Referring Provider (PT) Danae Orleans, PA    Next MD Visit 07/20/19      Observation/Other Assessments   Observations Incision pink with no immediate sings of infection Couple "bubble/blister" spots but none open at this time. Spots are along incision line.                         [facility] Adult PT Treatment/Exercise - 07/18/19 0001      Knee/Hip Exercises: Supine  Other Supine Knee/Hip Exercises R knee PROM - tolerated moderately well , TKE activation wtih PT overpressure into extension.       Manual Therapy   Manual Therapy Edema management    Manual therapy comments all manual therapy completed separately from other skilled interventions    Edema Management R LE with leg elevated    Manual Traction r knee traction sitting EOB - tolerated well - less pain noted                   PT Education - 07/18/19 1256    Education Details on blisters on incision, on incision care, on no concerns of infection at this time. Answered all questions about pain/incision    Person(s) Educated Patient    Methods Explanation    Comprehension Verbalized understanding            PT Short Term Goals - 07/05/19 1211      PT SHORT TERM GOAL #1   Title Patient will demonstrate at least 10-90 degrees of right knee ROM to demonstrate improved knee mobility    Baseline 10-96    Time 4    Period Weeks    Status Achieved    Target Date 07/11/19      PT SHORT TERM GOAL #2    Title Patient will report at least 25% improvement in overall symptoms and/or functional ability.    Baseline 60% better    Time 4    Period Weeks    Status Achieved    Target Date 07/11/19      PT SHORT TERM GOAL #3   Title Patient will be independent in self management strategies to improve quality of life and functional outcomes.    Time 4    Period Weeks    Status On-going    Target Date 07/11/19             PT Long Term Goals - 07/05/19 1212      PT LONG TERM GOAL #1   Title Patient will be able to ascend and descend stairs with reciprocal gait pattern and use of railing as needed to improve ability to ascend and descend steps at home.    Time 8    Period Weeks    Status On-going      PT LONG TERM GOAL #2   Title Patient will demonstrate 2-120 degrees of right knee ROM to demosntrate improved knee mobility    Baseline 10-96    Time 8    Period Weeks    Status On-going      PT LONG TERM GOAL #3   Title Patient will report at least 50% improvement in overall symptoms and/or functional ability.    Baseline 60%    Time 8    Period Weeks    Status On-going      PT LONG TERM GOAL #4   Title Patient will be able to ambulate 2 minutes without major gait deviations and without assistive device to return to prior level of function    Time 8    Period Weeks    Status On-going                 Plan - 07/18/19 1259    Clinical Impression Statement Patient arrived today with complaints of knee blisters that burst on Friday when she was bending her knee. Blisters are tiny and right along the incision MD has put her on antibiotics. Currently no signs of infection  noted. Educated patient in incision care, patient to follow up with MD on Wednesday. Focused on gentle ROM and edema mobilization. Will follow up with patient about blisters and pain next session.    Personal Factors and Comorbidities Time since onset of injury/illness/exacerbation;Comorbidity 2;Fitness     Comorbidities Hx of DVT, HTN    Examination-Activity Limitations Stand;Locomotion Level;Transfers;Squat;Stairs;Bed Mobility;Bathing;Sit;Sleep;Caring for Others;Lift;Hygiene/Grooming;Dressing;Toileting    Examination-Participation Restrictions Driving;Community Activity;Cleaning;Shop;Meal Prep;Laundry    Stability/Clinical Decision Making Stable/Uncomplicated    Rehab Potential Good    PT Frequency --   3x/week for 3 weeks then 2x/week for 5 weeks   PT Duration 8 weeks    PT Treatment/Interventions ADLs/Self Care Home Management;Aquatic Therapy;Cryotherapy;Electrical Stimulation;Moist Heat;DME Instruction;Gait training;Stair training;Functional mobility training;Therapeutic activities;Therapeutic exercise;Balance training;Neuromuscular re-education;Patient/family education;Orthotic Fit/Training;Manual techniques;Passive range of motion;Dry needling;Energy conservation;Splinting;Taping;Scar mobilization    PT Next Visit Plan f/u with knee blisters. TKE walking, continue with a focus on improving Rt knee AROM, gait with LRAD and reduction of scar tissue through manual techniques.    PT Home Exercise Plan 5/10 heel slides, knee AAROM seated flexion, quad sets; 5/17 LAQs; 07/06/19: TKE ball against wall, SAQ, scar tissue massage           Patient will benefit from skilled therapeutic intervention in order to improve the following deficits and impairments:  Abnormal gait, Increased fascial restricitons, Improper body mechanics, Pain, Decreased mobility, Decreased activity tolerance, Decreased endurance, Decreased range of motion, Decreased strength, Hypomobility, Difficulty walking, Decreased knowledge of precautions, Decreased knowledge of use of DME, Decreased scar mobility, Increased edema, Decreased skin integrity, Decreased balance  Visit Diagnosis: Chronic pain of right knee  Muscle weakness (generalized)  Difficulty in walking, not elsewhere classified     Problem List Patient Active  Problem List   Diagnosis Date Noted  . S/P right TKA 06/07/2019  . History of DVT (deep vein thrombosis) 04/26/2018  . Current use of long term anticoagulation 04/26/2018  . 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 Chronic pain of right knee 10/22/2017  . Right foot pain 10/22/2017  . Neck pain 10/22/2017  . Obesity (BMI 35.0-39.9 without comorbidity) 10/22/2017  . Healthcare maintenance 10/20/2017  . History of pulmonary embolus (PE) 10/20/2017  . GERD (gastroesophageal reflux disease) 09/08/2017  . Cough 08/11/2017  . Essential hypertension 08/11/2017  . Obstructive sleep apnea 08/11/2017   1:05 PM, 07/18/19 Jerene Pitch, DPT Physical Therapy with [facility]  [phone] office  [place] [address] [place], [place], [address] Phone: [phone]   Fax:  [phone]  Name: Isabel Garcia MRN: [id number] Date of Birth: [birth year]

## 2019-07-26 ENCOUNTER — Other Ambulatory Visit: Payer: Self-pay

## 2019-07-26 ENCOUNTER — Encounter (HOSPITAL_COMMUNITY): Payer: Self-pay | Admitting: Physical Therapy

## 2019-07-26 ENCOUNTER — Ambulatory Visit (HOSPITAL_COMMUNITY): Payer: 59 | Admitting: Physical Therapy

## 2019-07-26 DIAGNOSIS — R262 Difficulty in walking, not elsewhere classified: Secondary | ICD-10-CM

## 2019-07-26 DIAGNOSIS — M25561 Pain in right knee: Secondary | ICD-10-CM

## 2019-07-26 DIAGNOSIS — G8929 Other chronic pain: Secondary | ICD-10-CM

## 2019-07-26 DIAGNOSIS — M6281 Muscle weakness (generalized): Secondary | ICD-10-CM

## 2019-07-26 NOTE — Therapy (Signed)
[place], [place], [address] Phone: [phone]   Fax:  [phone]  Physical Therapy Treatment and [place]  Patient Details  Name: Isabel Garcia MRN: [id number] Date of Birth: May 26, 1955 Referring Provider (PT): Danae Orleans, [place]   Encounter Date: 07/26/2019   PT End of Session - 07/26/19 1043    Visit Number 13    Number of Visits 23   pt would like to be done by visit 20 per insurance   Date for PT Re-Evaluation 08/30/19   Eval: 06/13/19; PN:07/05/19   Authorization Type [employer]; no auth required [id number]    Authorization - Visit Number 14    Authorization - Number of Visits 30    Progress Note Due on Visit 19    PT Start Time 1045    PT Stop Time 1125    PT Time Calculation (min) 40 min    Activity Tolerance Patient tolerated treatment well    Behavior During Therapy WFL for tasks assessed/performed           Past Medical History:  Diagnosis Date   Achilles tendinitis    Arthritis    Asthma    Diverticulitis    Diverticulosis    GERD (gastroesophageal reflux disease)    Heart murmur    as a child   History of blood clots    History of chicken pox    History of pulmonary embolus (PE) 2018   Hypertension    Liver cirrhosis secondary to NASH (nonalcoholic steatohepatitis) ([place])    Osteoarthritis    Phlebitis    Plantar fasciitis    Vertigo     Past Surgical History:  Procedure Laterality Date   ABDOMINAL HYSTERECTOMY     bone spur removal     KNEE ARTHROSCOPY Right    KNEE ARTHROSCOPY Right    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   Procedure: TOTAL KNEE ARTHROPLASTY;  Surgeon: Paralee Cancel, MD;  Location: [facility];  Service: Orthopedics;  Laterality: Right;  70 mins    There were no vitals filed for this visit.   Subjective Assessment - 07/26/19 1050    Subjective States that she saw the MD and he was very pleased but he wants 0-120 degrees of motion. States that  she has been walking some without the cane. States he thinks the blisters are stitches just trying to work themselves out.    Currently in Pain? Yes    Pain Score 2     Pain Location Knee    Pain Orientation Right;Medial    Pain Descriptors / Indicators Aching    Pain Type Acute pain    Pain Onset More than a month ago              [facility] PT Assessment - 07/26/19 0001      Assessment   Medical Diagnosis R TKA    Referring Provider (PT) Danae Orleans, [place]    Onset Date/Surgical Date 06/07/19    Next MD Visit 07/20/19      Ambulation/Gait   Ambulation/Gait Yes    Ambulation/Gait Assistance 5: Supervision    Ambulation Distance (Feet) 226 Feet    Assistive device None    Gait Pattern Step-through pattern;Decreased hip/knee flexion - right;Decreased dorsiflexion - right;Decreased weight shift to right    Stairs Yes    Stair Management Technique One rail Left;Alternating pattern;Forwards   alternating after warmup    Number of Stairs 4    Height  of Stairs 7    Gait Comments 2MW, 12 reps of stairs                          [facility] Adult PT Treatment/Exercise - 07/26/19 0001      Knee/Hip Exercises: Supine   Heel Slides AROM;Right;15 reps    Knee Extension AROM;Right    Knee Extension Limitations 10    Knee Flexion AROM;Right    Knee Flexion Limitations 110      Knee/Hip Exercises: Prone   Prone Knee Hang 1 minute   5 reps with pillow under thigh R                  PT Education - 07/26/19 1119    Education Details on current condition, on progress and focus moving forward.    Person(s) Educated Patient    Methods Explanation    Comprehension Verbalized understanding            PT Short Term Goals - 07/26/19 1217      PT SHORT TERM GOAL #1   Title Patient will demonstrate at least 10-90 degrees of right knee ROM to demonstrate improved knee mobility    Baseline 10-96    Time 4    Period Weeks    Status Achieved    Target Date 07/11/19        PT SHORT TERM GOAL #2   Title Patient will report at least 25% improvement in overall symptoms and/or functional ability.    Baseline 60% better    Time 4    Period Weeks    Status Achieved    Target Date 07/11/19      PT SHORT TERM GOAL #3   Title Patient will be independent in self management strategies to improve quality of life and functional outcomes.    Time 4    Period Weeks    Status Achieved    Target Date 07/11/19             PT Long Term Goals - 07/26/19 1058      PT LONG TERM GOAL #1   Title Patient will be able to ascend and descend stairs with reciprocal gait pattern and use of railing as needed to improve ability to ascend and descend steps at home.    Baseline able to do after warm up    Time 8    Period Weeks    Status Achieved      PT LONG TERM GOAL #2   Title Patient will demonstrate 2-120 degrees of right knee ROM to demosntrate improved knee mobility    Baseline 10-110    Time 8    Period Weeks    Status On-going      PT LONG TERM GOAL #3   Title Patient will report at least 50% improvement in overall symptoms and/or functional ability.    Baseline 60%    Time 8    Period Weeks    Status Achieved      PT LONG TERM GOAL #4   Title Patient will be able to ambulate 2 minutes without major gait deviations and without assistive device to return to prior level of function    Baseline can walk 51mnutes without cane but has bent over posture and decreased hip extension    Time 8    Period Weeks    Status On-going      PT LONG TERM GOAL #5  Title Patient will be able to walk around for 10 minutes at a time without her cane to demonstrate improved walking endurance.    Time 5    Period Weeks    Status New    Target Date 08/30/19                 Plan - 07/26/19 1043    Clinical Impression Statement Patient has made great progress while in physical therapy. She is progressing well towards her goals and has met all short term goals at  this time and 2/4 long term goals. Discussed  continued functional limitations at this time and added additional long term goal to POC. Extending POC additional 5 weeks from todays date to continue to work on functional limitations and return patient to optimal function.    Personal Factors and Comorbidities Time since onset of injury/illness/exacerbation;Comorbidity 2;Fitness    Comorbidities Hx of DVT, HTN    Examination-Activity Limitations Stand;Locomotion Level;Transfers;Squat;Stairs;Bed Mobility;Bathing;Sit;Sleep;Caring for Others;Lift;Hygiene/Grooming;Dressing;Toileting    Examination-Participation Restrictions Driving;Community Activity;Cleaning;Shop;Meal Prep;Laundry    Stability/Clinical Decision Making Stable/Uncomplicated    Rehab Potential Good    PT Frequency 1x / week    PT Duration --   5 weeks   PT Treatment/Interventions ADLs/Self Care Home Management;Aquatic Therapy;Cryotherapy;Electrical Stimulation;Moist Heat;DME Instruction;Gait training;Stair training;Functional mobility training;Therapeutic activities;Therapeutic exercise;Balance training;Neuromuscular re-education;Patient/family education;Orthotic Fit/Training;Manual techniques;Passive range of motion;Dry needling;Energy conservation;Splinting;Taping;Scar mobilization    PT Next Visit Plan walking without cane, focus on TKE ROM and strength, TKE walking,    PT Home Exercise Plan 5/10 heel slides, knee AAROM seated flexion, quad sets; 5/17 LAQs; 07/06/19: TKE ball against wall, SAQ, scar tissue massage           Patient will benefit from skilled therapeutic intervention in order to improve the following deficits and impairments:  Abnormal gait, Increased fascial restricitons, Improper body mechanics, Pain, Decreased mobility, Decreased activity tolerance, Decreased endurance, Decreased range of motion, Decreased strength, Hypomobility, Difficulty walking, Decreased knowledge of precautions, Decreased knowledge of use of DME,  Decreased scar mobility, Increased edema, Decreased skin integrity, Decreased balance  Visit Diagnosis: Chronic pain of right knee  Muscle weakness (generalized)  Difficulty in walking, not elsewhere classified     Problem List Patient Active Problem List   Diagnosis Date Noted   S/P right TKA 06/07/2019   History of DVT (deep vein thrombosis) 04/26/2018   Current use of long term anticoagulation 04/26/2018   Chronic low back pain with right-sided sciatica 10/22/2017   Chronic pain of right knee 10/22/2017   Right foot pain 10/22/2017   Neck pain 10/22/2017   Obesity (BMI 35.0-39.9 without comorbidity) 10/22/2017   Healthcare maintenance 10/20/2017   History of pulmonary embolus (PE) 10/20/2017   GERD (gastroesophageal reflux disease) 09/08/2017   Cough 08/11/2017   Essential hypertension 08/11/2017   Obstructive sleep apnea 08/11/2017   12:22 PM, 07/26/19 Jerene Pitch, DPT Physical Therapy with [facility]  [phone] office  [place], [place], [address] Phone: [phone]   Fax:  [phone]  Name: Isabel Garcia MRN: [id number] Date of Birth: [birth year]

## 2019-07-28 ENCOUNTER — Other Ambulatory Visit: Payer: Self-pay

## 2019-07-28 ENCOUNTER — Ambulatory Visit (HOSPITAL_COMMUNITY): Payer: 59 | Admitting: Physical Therapy

## 2019-07-28 DIAGNOSIS — M6281 Muscle weakness (generalized): Secondary | ICD-10-CM

## 2019-07-28 DIAGNOSIS — M25561 Pain in right knee: Secondary | ICD-10-CM

## 2019-07-28 DIAGNOSIS — R262 Difficulty in walking, not elsewhere classified: Secondary | ICD-10-CM

## 2019-07-28 NOTE — Therapy (Signed)
[place], [place], [address] Phone: [phone]   Fax:  [phone]  Physical Therapy Treatment  Patient Details  Name: Isabel Garcia MRN: [id number] Date of Birth: 12-05-1955 Referring Provider (PT): Danae Orleans, [place]   Encounter Date: 07/28/2019   PT End of Session - 07/28/19 1123    Visit Number 14    Number of Visits 23   pt would like to be done by visit 20 per insurance   Date for PT Re-Evaluation 08/30/19   Eval: 06/13/19; PN:07/05/19   Authorization Type [employer]; no auth required [id number]    Authorization - Visit Number 15    Authorization - Number of Visits 30    Progress Note Due on Visit 19    PT Start Time 1050    PT Stop Time 1130    PT Time Calculation (min) 40 min    Activity Tolerance Patient tolerated treatment well    Behavior During Therapy [facility] for tasks assessed/performed           Past Medical History:  Diagnosis Date  . Achilles tendinitis   . Arthritis   . Asthma   . Diverticulitis   . Diverticulosis   . GERD (gastroesophageal reflux disease)   . Heart murmur    as a child  . History of blood clots   . History of chicken pox   . History of pulmonary embolus (PE) 2018  . Hypertension   . Liver cirrhosis secondary to NASH (nonalcoholic steatohepatitis) ([place])   . Osteoarthritis   . Phlebitis   . Plantar fasciitis   . Vertigo     Past Surgical History:  Procedure Laterality Date  . ABDOMINAL HYSTERECTOMY    . bone spur removal    . KNEE ARTHROSCOPY Right   . KNEE ARTHROSCOPY Right   . 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   Procedure: TOTAL KNEE ARTHROPLASTY;  Surgeon: Paralee Cancel, MD;  Location: [facility];  Service: Orthopedics;  Laterality: Right;  70 mins    There were no vitals filed for this visit.   Subjective Assessment - 07/28/19 1054    Subjective Pt states taht she forgot her cane.  She states that there is a stitch that  a little bloof is coming out of.   Pain is improving    Pertinent History hx of B DVT    Limitations Standing;Walking;Sitting    How long can you sit comfortably? 30 minutes because feet start swelling    How long can you stand comfortably? 5 minutes    How long can you walk comfortably? 5 minutes    Currently in Pain? Yes    Pain Score 2     Pain Location Knee    Pain Orientation Right    Pain Descriptors / Indicators Aching    Pain Type Acute pain    Pain Onset More than a month ago    Pain Frequency Intermittent    Aggravating Factors  activity    Pain Relieving Factors rest and ice                             [facility] Adult PT Treatment/Exercise - 07/28/19 0001      Knee/Hip Exercises: Stretches   Active Hamstring Stretch Right;3 reps;30 seconds    Knee: Self-Stretch to increase Flexion 5 reps;Limitations    Knee: Self-Stretch Limitations 15 "     Gastroc Stretch Both;3  reps;30 seconds      Knee/Hip Exercises: Aerobic   Stationary Bike 4'; seat 12 rocking back and forth end of session not included in charges.       Knee/Hip Exercises: Standing   Heel Raises Both;15 reps    Knee Flexion Right;10 reps    Terminal Knee Extension Right;10 reps    Rocker Board 2 minutes      Knee/Hip Exercises: Supine   Quad Sets 10 reps    Heel Slides Right;5 reps    Knee Extension AROM;Right    Knee Extension Limitations 6    Knee Flexion AROM;Right    Knee Flexion Limitations 115                    PT Short Term Goals - 07/26/19 1217      PT SHORT TERM GOAL #1   Title Patient will demonstrate at least 10-90 degrees of right knee ROM to demonstrate improved knee mobility    Baseline 10-96    Time 4    Period Weeks    Status Achieved    Target Date 07/11/19      PT SHORT TERM GOAL #2   Title Patient will report at least 25% improvement in overall symptoms and/or functional ability.    Baseline 60% better    Time 4    Period Weeks    Status Achieved    Target Date 07/11/19      PT  SHORT TERM GOAL #3   Title Patient will be independent in self management strategies to improve quality of life and functional outcomes.    Time 4    Period Weeks    Status Achieved    Target Date 07/11/19             PT Long Term Goals - 07/26/19 1058      PT LONG TERM GOAL #1   Title Patient will be able to ascend and descend stairs with reciprocal gait pattern and use of railing as needed to improve ability to ascend and descend steps at home.    Baseline able to do after warm up    Time 8    Period Weeks    Status Achieved      PT LONG TERM GOAL #2   Title Patient will demonstrate 2-120 degrees of right knee ROM to demosntrate improved knee mobility    Baseline 10-110    Time 8    Period Weeks    Status On-going      PT LONG TERM GOAL #3   Title Patient will report at least 50% improvement in overall symptoms and/or functional ability.    Baseline 60%    Time 8    Period Weeks    Status Achieved      PT LONG TERM GOAL #4   Title Patient will be able to ambulate 2 minutes without major gait deviations and without assistive device to return to prior level of function    Baseline can walk 55mnutes without cane but has bent over posture and decreased hip extension    Time 8    Period Weeks    Status On-going      PT LONG TERM GOAL #5   Title Patient will be able to walk around for 10 minutes at a time without her cane to demonstrate improved walking endurance.    Time 5    Period Weeks    Status New    Target Date 08/30/19  Plan - 07/28/19 1126    Clinical Impression Statement PT treatment focused on increasing ROM. Currently 6-115 Pt quad contraction is weak therefore therapist recommended that pt complete 50/(5 x 10 reps) a day.  Pt has 3 small pin hole openings where stitches were with slight oozing therapist explained to pt to keep covered and use a small amount of neosporin.    Personal Factors and Comorbidities Time since onset of  injury/illness/exacerbation;Comorbidity 2;Fitness    Examination-Activity Limitations Stand;Locomotion Level;Transfers;Squat;Stairs;Bed Mobility;Bathing;Sit;Sleep;Caring for Others;Lift;Hygiene/Grooming;Dressing;Toileting    Examination-Participation Restrictions Driving;Community Activity;Cleaning;Shop;Meal Prep;Laundry    Stability/Clinical Decision Making Stable/Uncomplicated    Rehab Potential Good    PT Frequency 1x / week    PT Treatment/Interventions ADLs/Self Care Home Management;Aquatic Therapy;Cryotherapy;Electrical Stimulation;Moist Heat;DME Instruction;Gait training;Stair training;Functional mobility training;Therapeutic activities;Therapeutic exercise;Balance training;Neuromuscular re-education;Patient/family education;Orthotic Fit/Training;Manual techniques;Passive range of motion;Dry needling;Energy conservation;Splinting;Taping;Scar mobilization    PT Next Visit Plan Assess openings on inscision, begin stool scoot    PT Home Exercise Plan 5/10 heel slides, knee AAROM seated flexion, quad sets; 5/17 LAQs; 07/06/19: TKE ball against wall, SAQ, scar tissue massage           Patient will benefit from skilled therapeutic intervention in order to improve the following deficits and impairments:  Abnormal gait, Increased fascial restricitons, Improper body mechanics, Pain, Decreased mobility, Decreased activity tolerance, Decreased endurance, Decreased range of motion, Decreased strength, Hypomobility, Difficulty walking, Decreased knowledge of precautions, Decreased knowledge of use of DME, Decreased scar mobility, Increased edema, Decreased skin integrity, Decreased balance  Visit Diagnosis: Chronic pain of right knee  Muscle weakness (generalized)  Difficulty in walking, not elsewhere classified     Problem List Patient Active Problem List   Diagnosis Date Noted  . S/P right TKA 06/07/2019  . History of DVT (deep vein thrombosis) 04/26/2018  . Current use of long term  anticoagulation 04/26/2018  . 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 Chronic pain of right knee 10/22/2017  . Right foot pain 10/22/2017  . Neck pain 10/22/2017  . Obesity (BMI 35.0-39.9 without comorbidity) 10/22/2017  . Healthcare maintenance 10/20/2017  . History of pulmonary embolus (PE) 10/20/2017  . GERD (gastroesophageal reflux disease) 09/08/2017  . Cough 08/11/2017  . Essential hypertension 08/11/2017  . Obstructive sleep apnea 08/11/2017    Rayetta Humphrey, PT CLT [phone] 07/28/2019, 11:34 AM  [place] [address] [place], [place], [address] Phone: [phone]   Fax:  [phone]  Name: Isabel Garcia MRN: [id number] Date of Birth: Jul 19, 1955

## 2019-08-03 ENCOUNTER — Ambulatory Visit (HOSPITAL_COMMUNITY): Payer: 59

## 2019-08-03 ENCOUNTER — Encounter (HOSPITAL_COMMUNITY): Payer: Self-pay

## 2019-08-03 ENCOUNTER — Other Ambulatory Visit: Payer: Self-pay

## 2019-08-03 DIAGNOSIS — M25561 Pain in right knee: Secondary | ICD-10-CM | POA: Diagnosis not present

## 2019-08-03 DIAGNOSIS — R262 Difficulty in walking, not elsewhere classified: Secondary | ICD-10-CM

## 2019-08-03 DIAGNOSIS — M6281 Muscle weakness (generalized): Secondary | ICD-10-CM

## 2019-08-03 NOTE — Therapy (Signed)
[place], [place], [address] Phone: [phone]   Fax:  [phone]  Physical Therapy Treatment  Patient Details  Name: Isabel Garcia MRN: [id number] Date of Birth: [birth year] Referring Provider (PT): Danae Orleans, [place]   Encounter Date: 08/03/2019   PT End of Session - 08/03/19 1016    Visit Number 15    Number of Visits 23   pt would like to be done by visit 20 per insurance   Date for PT Re-Evaluation 08/30/19   Eval: 06/13/19; PN:07/05/19   Authorization Type [employer]; no auth required [id number]    Authorization - Visit Number 16    Authorization - Number of Visits 30    Progress Note Due on Visit 19    PT Start Time 1005    PT Stop Time 1050   5' on bike at EOS   PT Time Calculation (min) 45 min    Activity Tolerance Patient tolerated treatment well    Behavior During Therapy [facility] for tasks assessed/performed           Past Medical History:  Diagnosis Date  . Achilles tendinitis   . Arthritis   . Asthma   . Diverticulitis   . Diverticulosis   . GERD (gastroesophageal reflux disease)   . Heart murmur    as a child  . History of blood clots   . History of chicken pox   . History of pulmonary embolus (PE) 2018  . Hypertension   . Liver cirrhosis secondary to NASH (nonalcoholic steatohepatitis) ([facility])   . Osteoarthritis   . Phlebitis   . Plantar fasciitis   . Vertigo     Past Surgical History:  Procedure Laterality Date  . ABDOMINAL HYSTERECTOMY    . bone spur removal    . KNEE ARTHROSCOPY Right   . KNEE ARTHROSCOPY Right   . 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   Procedure: TOTAL KNEE ARTHROPLASTY;  Surgeon: Paralee Cancel, MD;  Location: [facility];  Service: Orthopedics;  Laterality: Right;  70 mins    There were no vitals filed for this visit.   Subjective Assessment - 08/03/19 1004    Subjective Pt stated she went for walk to her neighbors yesterday without cane, was an incline slope that  was a little difficulty.  Knee continues to bleed, arrived with bandaid on incision.    Pertinent History hx of B DVT    Patient Stated Goals To have less pain    Currently in Pain? Yes    Pain Location Knee    Pain Orientation Right    Pain Descriptors / Indicators Aching    Pain Type Acute pain    Pain Onset More than a month ago    Pain Frequency Intermittent    Aggravating Factors  activity    Pain Relieving Factors rest and ice                             [facility] Adult PT Treatment/Exercise - 08/03/19 0001      Knee/Hip Exercises: Stretches   Active Hamstring Stretch Right;3 reps;30 seconds    Active Hamstring Stretch Limitations 14in step standing    Knee: Self-Stretch to increase Flexion Limitations;5 reps;10 seconds    Knee: Self-Stretch Limitations 14in step; knee drive 5x 10" hol    Gastroc Stretch Both;3 reps;30 seconds    Gastroc Stretch Limitations slant board  Knee/Hip Exercises: Standing   Heel Raises Limitations heel and toe walking 1RT    Terminal Knee Extension Right;15 reps    Terminal Knee Extension Limitations purple band      Knee/Hip Exercises: Seated   Stool Scoot - Round Trips 1RT      Knee/Hip Exercises: Supine   Short Arc Quad Sets Right;15 reps    Short Arc Quad Sets Limitations 5" holds                    PT Short Term Goals - 07/26/19 1217      PT SHORT TERM GOAL #1   Title Patient will demonstrate at least 10-90 degrees of right knee ROM to demonstrate improved knee mobility    Baseline 10-96    Time 4    Period Weeks    Status Achieved    Target Date 07/11/19      PT SHORT TERM GOAL #2   Title Patient will report at least 25% improvement in overall symptoms and/or functional ability.    Baseline 60% better    Time 4    Period Weeks    Status Achieved    Target Date 07/11/19      PT SHORT TERM GOAL #3   Title Patient will be independent in self management strategies to improve quality of life and  functional outcomes.    Time 4    Period Weeks    Status Achieved    Target Date 07/11/19             PT Long Term Goals - 07/26/19 1058      PT LONG TERM GOAL #1   Title Patient will be able to ascend and descend stairs with reciprocal gait pattern and use of railing as needed to improve ability to ascend and descend steps at home.    Baseline able to do after warm up    Time 8    Period Weeks    Status Achieved      PT LONG TERM GOAL #2   Title Patient will demonstrate 2-120 degrees of right knee ROM to demosntrate improved knee mobility    Baseline 10-110    Time 8    Period Weeks    Status On-going      PT LONG TERM GOAL #3   Title Patient will report at least 50% improvement in overall symptoms and/or functional ability.    Baseline 60%    Time 8    Period Weeks    Status Achieved      PT LONG TERM GOAL #4   Title Patient will be able to ambulate 2 minutes without major gait deviations and without assistive device to return to prior level of function    Baseline can walk 65mnutes without cane but has bent over posture and decreased hip extension    Time 8    Period Weeks    Status On-going      PT LONG TERM GOAL #5   Title Patient will be able to walk around for 10 minutes at a time without her cane to demonstrate improved walking endurance.    Time 5    Period Weeks    Status New    Target Date 08/30/19                 Plan - 08/03/19 1037    Clinical Impression Statement Continued session focus with knee mobiltiy.  Added heel/toe walking for dorsiflexion/ gastroc strengthening  as well as stool scooting for quad and hamstring strengthening.  Pt with scabs present over opening in incision, no oozing or s/s of infection present this sessoin.  Encouraged pt to keep incision covered until full healed.  AROM 5-117 degrees.    Personal Factors and Comorbidities Time since onset of injury/illness/exacerbation;Comorbidity 2;Fitness    Comorbidities Hx of DVT,  HTN    Examination-Activity Limitations Stand;Locomotion Level;Transfers;Squat;Stairs;Bed Mobility;Bathing;Sit;Sleep;Caring for Others;Lift;Hygiene/Grooming;Dressing;Toileting    Examination-Participation Restrictions Driving;Community Activity;Cleaning;Shop;Meal Prep;Laundry    Stability/Clinical Decision Making Stable/Uncomplicated    Clinical Decision Making Low    Rehab Potential Good    PT Frequency 1x / week    PT Duration --   5 weeks   PT Treatment/Interventions ADLs/Self Care Home Management;Aquatic Therapy;Cryotherapy;Electrical Stimulation;Moist Heat;DME Instruction;Gait training;Stair training;Functional mobility training;Therapeutic activities;Therapeutic exercise;Balance training;Neuromuscular re-education;Patient/family education;Orthotic Fit/Training;Manual techniques;Passive range of motion;Dry needling;Energy conservation;Splinting;Taping;Scar mobilization    PT Next Visit Plan Continue knee mobility, progress strengthening as able.    PT Home Exercise Plan 5/10 heel slides, knee AAROM seated flexion, quad sets; 5/17 LAQs; 07/06/19: TKE ball against wall, SAQ, scar tissue massage           Patient will benefit from skilled therapeutic intervention in order to improve the following deficits and impairments:  Abnormal gait, Increased fascial restricitons, Improper body mechanics, Pain, Decreased mobility, Decreased activity tolerance, Decreased endurance, Decreased range of motion, Decreased strength, Hypomobility, Difficulty walking, Decreased knowledge of precautions, Decreased knowledge of use of DME, Decreased scar mobility, Increased edema, Decreased skin integrity, Decreased balance  Visit Diagnosis: Chronic pain of right knee  Muscle weakness (generalized)  Difficulty in walking, not elsewhere classified     Problem List Patient Active Problem List   Diagnosis Date Noted  . S/P right TKA 06/07/2019  . History of DVT (deep vein thrombosis) 04/26/2018  . Current  use of long term anticoagulation 04/26/2018  . 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 Chronic pain of right knee 10/22/2017  . Right foot pain 10/22/2017  . Neck pain 10/22/2017  . Obesity (BMI 35.0-39.9 without comorbidity) 10/22/2017  . Healthcare maintenance 10/20/2017  . History of pulmonary embolus (PE) 10/20/2017  . GERD (gastroesophageal reflux disease) 09/08/2017  . Cough 08/11/2017  . Essential hypertension 08/11/2017  . Obstructive sleep apnea 08/11/2017   Ihor Austin, LPTA/CLT; CBIS [phone]  Aldona Lento 08/03/2019, 12:05 PM  [place], [place], [address] Phone: [phone]   Fax:  [phone]  Name: JESALYN FINAZZO MRN: [id number] Date of Birth: 31-Mar-1955

## 2019-08-11 ENCOUNTER — Encounter: Payer: Self-pay | Admitting: Gastroenterology

## 2019-08-11 ENCOUNTER — Ambulatory Visit (INDEPENDENT_AMBULATORY_CARE_PROVIDER_SITE_OTHER): Payer: 59 | Admitting: Gastroenterology

## 2019-08-11 ENCOUNTER — Telehealth: Payer: Self-pay

## 2019-08-11 VITALS — BP 130/89 | HR 76 | Ht 67.0 in | Wt 222.0 lb

## 2019-08-11 DIAGNOSIS — Z7901 Long term (current) use of anticoagulants: Secondary | ICD-10-CM | POA: Diagnosis not present

## 2019-08-11 DIAGNOSIS — Z86010 Personal history of colonic polyps: Secondary | ICD-10-CM

## 2019-08-11 DIAGNOSIS — K760 Fatty (change of) liver, not elsewhere classified: Secondary | ICD-10-CM | POA: Diagnosis not present

## 2019-08-11 MED ORDER — SUTAB 1479-225-188 MG PO TABS: 1.0000 | ORAL_TABLET | Freq: Once | ORAL | 0 refills | Status: AC

## 2019-08-11 NOTE — Progress Notes (Signed)
HPI :  64 year old female with a history of colon polyps, pulmonary embolism and DVT on Eliquis, diverticulitis, referred by Jodi Mourning, FNP to discuss having a colonoscopy.  She also has a reported history of fatty liver and possible cirrhosis.  Patient states her last colonoscopy was she thinks around 12 done in [place], no records available.  She states she had some colon polyps removed and was told to repeat the exam in 5 years.  She denies any problems with her bowels.  She does have a history of diverticulitis but states this has not bothered her in several years.  She denies any blood in her stools.  No family history of colon cancer.  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  She does have a history of asthma, she states it is well controlled, she otherwise denies any cardiopulmonary symptoms.  She does also have a history of GERD, takes omeprazole 40 mg a day and works really well for her, she does have reflux symptoms if she misses a dose.  She denies any dysphagia.  She is never had an EGD.  No alarm symptoms.  She does endorse being told she had a history of a fatty liver but has never been told she has any history of cirrhosis.  In her chart it appears there is documentation of [name] related cirrhosis.  She does not have any known decompensations previously.  Her platelet count has appeared normal.  Patient is adamant she has never been told she has cirrhosis and questions this diagnosis.  She does not drink any alcohol routinely or heavily.     Past Medical History:  Diagnosis Date   Achilles tendinitis    Arthritis    Asthma    Diverticulitis    Diverticulosis    GERD (gastroesophageal reflux disease)    Heart murmur    as a child   History of blood clots    History of chicken pox    History of pulmonary embolus (PE) 2018    Hypertension    Liver cirrhosis secondary to NASH (nonalcoholic steatohepatitis) ([facility])    Osteoarthritis    Phlebitis    Plantar fasciitis    Vertigo      Past Surgical History:  Procedure Laterality Date   ABDOMINAL HYSTERECTOMY     bone spur removal     KNEE ARTHROSCOPY Right    KNEE ARTHROSCOPY Right    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   Procedure: TOTAL KNEE ARTHROPLASTY;  Surgeon: Paralee Cancel, MD;  Location: [facility];  Service: Orthopedics;  Laterality: Right;  70 mins   Family History  Problem Relation Age of Onset   Arthritis Mother    Hypertension Mother    Hypertension Father    Heart attack Father    Early death Father    Asthma Sister    Heart attack Brother    Early death Brother    Hypertension Brother    Stroke Maternal Grandmother    Heart attack Maternal Grandfather    Heart attack Paternal Grandfather    Early death Paternal Grandfather    Colon cancer Neg Hx    Stomach cancer Neg Hx    Pancreatic cancer Neg Hx    Esophageal cancer Neg Hx    Social History   Tobacco Use   Smoking status: Never Smoker   Smokeless tobacco: Never Used  Vaping Use   Vaping Use: Never used  Substance Use Topics   Alcohol use: Not Currently   Drug use: No   Current Outpatient Medications  Medication Sig Dispense Refill   albuterol (PROVENTIL HFA) 108 (90 Base) MCG/ACT inhaler Inhale 2 puffs into the lungs every 6 (six) hours as needed for wheezing or shortness of breath.      amLODipine (NORVASC) 10 MG tablet Take 1 tablet (10 mg total) by mouth daily. for high blood pressure 30 tablet 11   atorvastatin (LIPITOR) 20 MG tablet Take 1 tablet (20 mg total) by mouth daily. (Patient taking differently: Take 20 mg by mouth at bedtime. ) 90 tablet 0   cholecalciferol (VITAMIN D3) 25 MCG (1000 UNIT) tablet Take 1,000 Units by mouth daily.     COD LIVER OIL PO Take 1 capsule by mouth daily.     ELIQUIS 5 MG TABS tablet Take 1  tablet by mouth twice daily 60 tablet 1   fluticasone (FLONASE) 50 MCG/ACT nasal spray Place 2 sprays into both nostrils daily. 16 g 11   Fluticasone-Salmeterol (ADVAIR DISKUS) 250-50 MCG/DOSE AEPB Inhale 1 puff into the lungs 2 (two) times daily. 60 each 5   Iron-Vitamin C (IRON 100/C) 100-250 MG TABS Take by mouth.     montelukast (SINGULAIR) 10 MG tablet Take 1 tablet (10 mg total) by mouth at bedtime. 30 tablet 2   omeprazole (PRILOSEC) 40 MG capsule Take 1 capsule (40 mg total) by mouth daily. 30 capsule 3   Zinc Sulfate (ZINC 15 PO) Take 1 capsule by mouth daily.     Sodium Sulfate-Mag Sulfate-KCl (SUTAB) [phone] MG TABS Take 1 kit by mouth once for 1 dose. BIN: [id number] PCN: CN GROUP: [id number] MEMBER ID: [id number];IFO AS CASH 24 tablet 0   No current facility-administered medications for this visit.   No Known Allergies   Review of Systems: All systems reviewed and negative except where noted in HPI.    Reviewed in [facility] / care everywhere  Physical Exam: BP 130/89    Pulse 76    Ht 5' 7"  (1.702 m)    Wt 222 lb (100.7 kg)    BMI 34.77 kg/m  Constitutional: Pleasant,well-developed, female in no acute distress. HEENT: Normocephalic and atraumatic. Conjunctivae are normal. No scleral icterus. Neck supple.  Cardiovascular: Normal rate, regular rhythm.  Pulmonary/chest: Effort normal and breath sounds normal.  Abdominal: Soft, nondistended, nontender.  There are no masses palpable. . Extremities: no edema Lymphadenopathy: No cervical adenopathy noted. Neurological: Alert and oriented to person place and time. Skin: Skin is warm and dry. No rashes noted. Psychiatric: Normal mood and affect. Behavior is normal.   ASSESSMENT AND PLAN: 64 year old female here for new patient assessment the following:  History of colon polyps / anticoagulated - patient does not know where she had a colonoscopy done, unclear if we will will be able to get records of this to confirm  reported findings.  She is adamant she was told to have a follow-up colonoscopy in about 5 years due to history of colon polyps.  She is otherwise asymptomatic.  I offered her colonoscopy after discussion of risks and benefits.  We will need approval to hold Eliquis for 2 days prior to the exam to reduce her bleeding risk in case polypectomy is performed.  We will reach out to her prescribing provider to ensure that is okay.  Otherwise following discussion of this she wanted to proceed, further recommendations pending the results.  GERD -  no alarm symptoms, chronic stable symptoms, she has breakthrough if she misses a dose of omeprazole, will continue at present dose, discussed risk benefits of chronic PPI use.  Fatty liver - reported in her chart she has a history of [name] related cirrhosis however the patient is unaware of this and is emphatic that she is only had fatty liver and she does not have cirrhosis.  Her platelet count is normal, her LFTs are normal, both reassuring.  I offered her a right upper quadrant ultrasound to assess for cirrhotic changes given we do not have any recent imaging of her liver locally or in [place].  She was agreeable to this, further recommendations pending the results of that exam.  Steger Cellar, MD [facility]  CC: Marrian Salvage,*

## 2019-08-11 NOTE — Patient Instructions (Addendum)
If you are age 62 or older, your body mass index should be between 23-30. Your Body mass index is 34.77 kg/m. If this is out of the aforementioned range listed, please consider follow up with your Primary Care Provider.  If you are age 52 or younger, your body mass index should be between 19-25. Your Body mass index is 34.77 kg/m. If this is out of the aformentioned range listed, please consider follow up with your Primary Care Provider.   You have been scheduled for a colonoscopy. Please follow written instructions given to you at your visit today.  Please pick up your prep supplies at the pharmacy within the next 1-3 days. If you use inhalers (even only as needed), please bring them with you on the day of your procedure.   You have been scheduled for an abdominal ultrasound at [facility] (1st floor of hospital) on Tuesday, 09-06-19 at 9:30am. Please arrive 15 minutes prior to your appointment for registration. Make certain not to have anything to eat or drink 6 hours prior to your appointment. Should you need to reschedule your appointment, please contact radiology at [phone]. This test typically takes about 30 minutes to perform.   Due to recent changes in healthcare laws, you may see the results of your imaging and laboratory studies on MyChart before your provider has had a chance to review them.  We understand that in some cases there may be results that are confusing or concerning to you. Not all laboratory results come back in the same time frame and the provider may be waiting for multiple results in order to interpret others.  Please give [place] 48 hours in order for your provider to thoroughly review all the results before contacting the office for clarification of your results.    Thank you for entrusting me with your care and for choosing [facility], Dr. [place] Cellar

## 2019-08-11 NOTE — Telephone Encounter (Signed)
° °  Isabel Garcia 07-18-1955 [id number]  Dear Dr. Delton Coombes:  We have scheduled the above named patient for a colonoscopy procedure. Our records show that (s)he is on anticoagulation therapy.  Please advise as to whether the patient may come off their therapy of ELIQUIS 2 days prior to their procedure which is scheduled for 09-28-19.  Please route your response to Lemar Lofty, CMA or fax response to [phone].  Thank you!  Sincerely,    [facility]

## 2019-08-12 ENCOUNTER — Encounter: Payer: Self-pay | Admitting: Pulmonary Disease

## 2019-08-12 ENCOUNTER — Ambulatory Visit (INDEPENDENT_AMBULATORY_CARE_PROVIDER_SITE_OTHER): Payer: 59 | Admitting: Pulmonary Disease

## 2019-08-12 ENCOUNTER — Other Ambulatory Visit: Payer: Self-pay

## 2019-08-12 VITALS — BP 134/84 | HR 69 | Temp 98.9°F | Ht 67.0 in | Wt 220.0 lb

## 2019-08-12 DIAGNOSIS — J4540 Moderate persistent asthma, uncomplicated: Secondary | ICD-10-CM

## 2019-08-12 DIAGNOSIS — G4733 Obstructive sleep apnea (adult) (pediatric): Secondary | ICD-10-CM | POA: Diagnosis not present

## 2019-08-12 DIAGNOSIS — Z86711 Personal history of pulmonary embolism: Secondary | ICD-10-CM | POA: Diagnosis not present

## 2019-08-12 DIAGNOSIS — J301 Allergic rhinitis due to pollen: Secondary | ICD-10-CM | POA: Diagnosis not present

## 2019-08-12 MED ORDER — AZELASTINE HCL 0.1 % NA SOLN: 1.0000 | Freq: Two times a day (BID) | NASAL | 12 refills | Status: DC

## 2019-08-12 NOTE — Progress Notes (Signed)
[facility]  Chief Complaint  Patient presents with   Follow-up    stuffy/runny nose with tinge of yellow and headaches    Constitutional:  BP 134/84 (BP Location: Left Arm, Cuff Size: Normal)    Pulse 69    Temp 98.9 F (37.2 C) (Oral)    Ht 5' 7"  (1.702 m)    Wt 220 lb (99.8 kg)    SpO2 100%    BMI 34.46 kg/m   Past Medical History:  OA, Diverticulitis, GERD, HTN, NASH with cirrhosis, Vertigo  Brief Summary:  Isabel Garcia is a 64 y.o. female with asthma, obstructive sleep apnea and pulmonary embolism with DVT.  Subjective:  She has insurance now and is able to get medications.  She is back on eliquis.  Has colonoscopy scheduled for August and there was concern for cirrhosis.  GI is assessing.  Has sinus congestion, post nasal drip, and intermittent headaches on left side of her head.  Has cough with clear sputum and intermittent wheezing.  Sleep okay.  She doesn't think she can use CPAP.  Physical Exam:   Appearance - well kempt   ENMT - no sinus tenderness, no oral exudate, no LAN, Mallampati 4 airway, no stridor  Respiratory - equal breath sounds bilaterally, no wheezing or rales  CV - s1s2 regular rate and rhythm, no murmurs  Ext - no clubbing, no edema  Skin - no rashes  Psych - normal mood and affect  Assessment/Plan:   History of pulmonary embolism. - continue eliquis  Moderate persistent asthma. - continue advair, singulair - prn albuterol  Allergic rhinitis. - main issue at present - add nasal irrigation and azelastine - continue flonase, singulair  Obstructive sleep apnea. - she hasn't been able to tolerate CPAP  A total of  22 minutes spent addressing patient care issues on day of visit.  Follow up:   Patient Instructions  Use nasal irrigation (saline nasal spray) once or twice daily  Azelastine nasal spray, 1 spray in each nostril twice per day  Follow up in 1 year   Signature:  Chesley Mires, MD [place] Pager: [phone] 08/12/2019, 12:12 PM  Flow Sheet     Pulmonary tests:   PFT 09/08/17 >> FEV1 2.20 (94%), FEV1% 89, TLC 4.58 (84%), DLCO 70%  Chest imaging:   CT angio chest 03/01/17 >> b/l PE, patchy basilar GGO  CT chest 07/15/17 >> small PE LLL segmental artery  Sleep tests:   HST 04/24/17 >> AHI 29.9, SpO2 low 67%  CPAP 07/04/17 >> CPAP 10 cm H2O   Cardiac tests:   Doppler legs 03/01/17 >> nonocclusive DVT Rt popliteal vein  Echo 07/29/17 >> EF 65 to 70%, grade 1 DD  Medications:   Allergies as of 08/12/2019   No Known Allergies     Medication List       Accurate as of August 12, 2019 12:12 PM. If you have any questions, ask your nurse or doctor.        amLODipine 10 MG tablet Commonly known as: NORVASC Take 1 tablet (10 mg total) by mouth daily. for high blood pressure   atorvastatin 20 MG tablet Commonly known as: LIPITOR Take 1 tablet (20 mg total) by mouth daily. What changed: when to take this   azelastine 0.1 % nasal spray Commonly known as: ASTELIN Place 1 spray into both nostrils 2 (two) times daily. Use in each nostril as directed Started by: Chesley Mires, MD  cholecalciferol 25 MCG (1000 UNIT) tablet Commonly known as: VITAMIN D3 Take 1,000 Units by mouth daily.   COD LIVER OIL PO Take 1 capsule by mouth daily.   Eliquis 5 MG Tabs tablet Generic drug: apixaban Take 1 tablet by mouth twice daily   fluticasone 50 MCG/ACT nasal spray Commonly known as: FLONASE Place 2 sprays into both nostrils daily.   Fluticasone-Salmeterol 250-50 MCG/DOSE Aepb Commonly known as: Advair Diskus Inhale 1 puff into the lungs 2 (two) times daily.   Iron 100/C 100-250 MG Tabs Generic drug: Iron-Vitamin C Take by mouth.   montelukast 10 MG tablet Commonly known as: SINGULAIR Take 1 tablet (10 mg total) by mouth at bedtime.   omeprazole 40 MG capsule Commonly known as: PRILOSEC Take 1 capsule (40 mg total) by  mouth daily.   Proventil HFA 108 (90 Base) MCG/ACT inhaler Generic drug: albuterol Inhale 2 puffs into the lungs every 6 (six) hours as needed for wheezing or shortness of breath.   ZINC 15 PO Take 1 capsule by mouth daily.       Past Surgical History:  She  has a past surgical history that includes Abdominal hysterectomy; Knee arthroscopy (Right); Knee arthroscopy (Right); bone spur removal; and Total knee arthroplasty (Right, 06/07/2019).  Family History:  Her family history includes Arthritis in her mother; Asthma in her sister; Early death in her brother, father, and paternal grandfather; Heart attack in her brother, father, maternal grandfather, and paternal grandfather; Hypertension in her brother, father, and mother; Stroke in her maternal grandmother.  Social History:  She  reports that she has never smoked. She has never used smokeless tobacco. She reports previous alcohol use. She reports that she does not use drugs.

## 2019-08-12 NOTE — Patient Instructions (Signed)
Use nasal irrigation (saline nasal spray) once or twice daily  Azelastine nasal spray, 1 spray in each nostril twice per day  Follow up in 1 year

## 2019-08-12 NOTE — Telephone Encounter (Signed)
Called and spoke to pt.  She understands to hold her Eliquis for 2 days prior to her procedure.

## 2019-08-17 ENCOUNTER — Other Ambulatory Visit: Payer: Self-pay

## 2019-08-17 ENCOUNTER — Ambulatory Visit (HOSPITAL_COMMUNITY): Payer: 59 | Attending: Orthopedic Surgery | Admitting: Physical Therapy

## 2019-08-17 ENCOUNTER — Encounter (HOSPITAL_COMMUNITY): Payer: Self-pay | Admitting: Physical Therapy

## 2019-08-17 DIAGNOSIS — G8929 Other chronic pain: Secondary | ICD-10-CM

## 2019-08-17 DIAGNOSIS — M6281 Muscle weakness (generalized): Secondary | ICD-10-CM | POA: Diagnosis present

## 2019-08-17 DIAGNOSIS — R262 Difficulty in walking, not elsewhere classified: Secondary | ICD-10-CM | POA: Diagnosis present

## 2019-08-17 DIAGNOSIS — M25561 Pain in right knee: Secondary | ICD-10-CM | POA: Diagnosis present

## 2019-08-17 NOTE — Therapy (Signed)
[place], [place], [address] Phone: [phone]   Fax:  [phone]  Physical Therapy Treatment  Patient Details  Name: Isabel Garcia MRN: [id number] Date of Birth: [birth year] Referring Provider (PT): Danae Orleans, [place]   Encounter Date: 08/17/2019   PT End of Session - 08/17/19 1122    Visit Number 16    Number of Visits 23   pt would like to be done by visit 20 per insurance   Date for PT Re-Evaluation 08/30/19   Eval: 06/13/19; PN:07/05/19   Authorization Type [employer]; no auth required [id number]    Authorization - Visit Number 17    Authorization - Number of Visits 30    Progress Note Due on Visit 19    PT Start Time 1115    PT Stop Time 1158   Time on bike unbilled   PT Time Calculation (min) 43 min    Activity Tolerance Patient tolerated treatment well    Behavior During Therapy [facility] for tasks assessed/performed           Past Medical History:  Diagnosis Date  . Achilles tendinitis   . Arthritis   . Asthma   . Diverticulitis   . Diverticulosis   . GERD (gastroesophageal reflux disease)   . Heart murmur    as a child  . History of blood clots   . History of chicken pox   . History of pulmonary embolus (PE) 2018  . Hypertension   . Liver cirrhosis secondary to NASH (nonalcoholic steatohepatitis) ([place])   . Osteoarthritis   . Phlebitis   . Plantar fasciitis   . Vertigo     Past Surgical History:  Procedure Laterality Date  . ABDOMINAL HYSTERECTOMY    . bone spur removal    . KNEE ARTHROSCOPY Right   . KNEE ARTHROSCOPY Right   . 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   Procedure: TOTAL KNEE ARTHROPLASTY;  Surgeon: Paralee Cancel, MD;  Location: [facility];  Service: Orthopedics;  Laterality: Right;  70 mins    There were no vitals filed for this visit.   Subjective Assessment - 08/17/19 1121    Subjective Patient reported that she has been doing well. She stated that she still has some swelling  in the anterior and posterior aspects of her knee.    Pertinent History hx of B DVT    Patient Stated Goals To have less pain    Currently in Pain? Yes    Pain Score 2     Pain Location Knee    Pain Orientation Right;Medial    Pain Descriptors / Indicators Aching    Pain Onset More than a month ago                             [facility] Adult PT Treatment/Exercise - 08/17/19 0001      Knee/Hip Exercises: Stretches   Active Hamstring Stretch Right;3 reps;30 seconds    Active Hamstring Stretch Limitations 12'' step    Knee: Self-Stretch to increase Flexion Limitations;10 seconds    Knee: Self-Stretch Limitations 12in step; knee drive 03E 10" hold    Gastroc Stretch Both;3 reps;30 seconds    Gastroc Stretch Limitations slant board      Knee/Hip Exercises: Aerobic   Stationary Bike 5' seat 13 full revolution      Knee/Hip Exercises: Standing   Heel Raises Limitations heel and toe walking 1RT 15'  Terminal Knee Extension Right;10 reps    Terminal Knee Extension Limitations purple band, 10''      Knee/Hip Exercises: Supine   Quad Sets 10 reps    Short Arc Quad Sets Right;15 reps    Short Arc Quad Sets Limitations 5'' holds    Heel Slides Right;10 reps    Straight Leg Raises AROM;Strengthening;Right;1 set;10 reps    Straight Leg Raises Limitations 5'' holds    Knee Extension AROM;Right    Knee Extension Limitations 4    Knee Flexion AROM;Right    Knee Flexion Limitations 118                    PT Short Term Goals - 07/26/19 1217      PT SHORT TERM GOAL #1   Title Patient will demonstrate at least 10-90 degrees of right knee ROM to demonstrate improved knee mobility    Baseline 10-96    Time 4    Period Weeks    Status Achieved    Target Date 07/11/19      PT SHORT TERM GOAL #2   Title Patient will report at least 25% improvement in overall symptoms and/or functional ability.    Baseline 60% better    Time 4    Period Weeks    Status  Achieved    Target Date 07/11/19      PT SHORT TERM GOAL #3   Title Patient will be independent in self management strategies to improve quality of life and functional outcomes.    Time 4    Period Weeks    Status Achieved    Target Date 07/11/19             PT Long Term Goals - 07/26/19 1058      PT LONG TERM GOAL #1   Title Patient will be able to ascend and descend stairs with reciprocal gait pattern and use of railing as needed to improve ability to ascend and descend steps at home.    Baseline able to do after warm up    Time 8    Period Weeks    Status Achieved      PT LONG TERM GOAL #2   Title Patient will demonstrate 2-120 degrees of right knee ROM to demosntrate improved knee mobility    Baseline 10-110    Time 8    Period Weeks    Status On-going      PT LONG TERM GOAL #3   Title Patient will report at least 50% improvement in overall symptoms and/or functional ability.    Baseline 60%    Time 8    Period Weeks    Status Achieved      PT LONG TERM GOAL #4   Title Patient will be able to ambulate 2 minutes without major gait deviations and without assistive device to return to prior level of function    Baseline can walk 54mnutes without cane but has bent over posture and decreased hip extension    Time 8    Period Weeks    Status On-going      PT LONG TERM GOAL #5   Title Patient will be able to walk around for 10 minutes at a time without her cane to demonstrate improved walking endurance.    Time 5    Period Weeks    Status New    Target Date 08/30/19  Plan - 08/17/19 1156    Clinical Impression Statement Continued with a focus on improving patient's ROM. Introduced step-ups this session with patient demonstrating good form with 4'' steps and upper extremity assistance. Patient demonstrated improvements in AROM this session ranging from 4-118 degrees this session. Plan to progress with functional strengthening in upcoming  sessions.    Personal Factors and Comorbidities Time since onset of injury/illness/exacerbation;Comorbidity 2;Fitness    Comorbidities Hx of DVT, HTN    Examination-Activity Limitations Stand;Locomotion Level;Transfers;Squat;Stairs;Bed Mobility;Bathing;Sit;Sleep;Caring for Others;Lift;Hygiene/Grooming;Dressing;Toileting    Examination-Participation Restrictions Driving;Community Activity;Cleaning;Shop;Meal Prep;Laundry    Stability/Clinical Decision Making Stable/Uncomplicated    Rehab Potential Good    PT Frequency 1x / week    PT Duration --   5 weeks   PT Treatment/Interventions ADLs/Self Care Home Management;Aquatic Therapy;Cryotherapy;Electrical Stimulation;Moist Heat;DME Instruction;Gait training;Stair training;Functional mobility training;Therapeutic activities;Therapeutic exercise;Balance training;Neuromuscular re-education;Patient/family education;Orthotic Fit/Training;Manual techniques;Passive range of motion;Dry needling;Energy conservation;Splinting;Taping;Scar mobilization    PT Next Visit Plan Progress with functional strengthening    PT Home Exercise Plan 5/10 heel slides, knee AAROM seated flexion, quad sets; 5/17 LAQs; 07/06/19: TKE ball against wall, SAQ, scar tissue massage           Patient will benefit from skilled therapeutic intervention in order to improve the following deficits and impairments:  Abnormal gait, Increased fascial restricitons, Improper body mechanics, Pain, Decreased mobility, Decreased activity tolerance, Decreased endurance, Decreased range of motion, Decreased strength, Hypomobility, Difficulty walking, Decreased knowledge of precautions, Decreased knowledge of use of DME, Decreased scar mobility, Increased edema, Decreased skin integrity, Decreased balance  Visit Diagnosis: Chronic pain of right knee  Muscle weakness (generalized)  Difficulty in walking, not elsewhere classified     Problem List Patient Active Problem List   Diagnosis Date  Noted  . S/P right TKA 06/07/2019  . History of DVT (deep vein thrombosis) 04/26/2018  . Current use of long term anticoagulation 04/26/2018  . 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 Chronic pain of right knee 10/22/2017  . Right foot pain 10/22/2017  . Neck pain 10/22/2017  . Obesity (BMI 35.0-39.9 without comorbidity) 10/22/2017  . Healthcare maintenance 10/20/2017  . History of pulmonary embolus (PE) 10/20/2017  . GERD (gastroesophageal reflux disease) 09/08/2017  . Cough 08/11/2017  . Essential hypertension 08/11/2017  . Obstructive sleep apnea 08/11/2017   Clarene Critchley PT, DPT 11:58 AM, 08/17/19 [place] [address] [place], [place], [address] Phone: [phone]   Fax:  [phone]  Name: MENNIE SPILLER MRN: [id number] Date of Birth: 22-Aug-1955

## 2019-08-24 ENCOUNTER — Ambulatory Visit (HOSPITAL_COMMUNITY): Payer: 59 | Admitting: Physical Therapy

## 2019-08-24 ENCOUNTER — Encounter (HOSPITAL_COMMUNITY): Payer: Self-pay | Admitting: Physical Therapy

## 2019-08-24 ENCOUNTER — Other Ambulatory Visit: Payer: Self-pay

## 2019-08-24 DIAGNOSIS — M6281 Muscle weakness (generalized): Secondary | ICD-10-CM

## 2019-08-24 DIAGNOSIS — M25561 Pain in right knee: Secondary | ICD-10-CM | POA: Diagnosis not present

## 2019-08-24 DIAGNOSIS — G8929 Other chronic pain: Secondary | ICD-10-CM

## 2019-08-24 DIAGNOSIS — R262 Difficulty in walking, not elsewhere classified: Secondary | ICD-10-CM

## 2019-08-24 NOTE — Therapy (Signed)
[place] [address] [place], [place], [address] Phone: [phone]   Fax:  [phone]  Physical Therapy Treatment and Progress note  Patient Details  Name: Isabel Garcia MRN: [id number] Date of Birth: May 03, 1955 Referring Provider (PT): Danae Orleans, [place]  Progress Note Reporting Period 07/05/19 to 08/24/19 See note below for Objective Data and Assessment of Progress/Goals.       Encounter Date: 08/24/2019   PT End of Session - 08/24/19 1136    Visit Number 17    Number of Visits 23   pt would like to be done by visit 20 per insurance   Date for PT Re-Evaluation 08/30/19   Eval: 06/13/19; PN:07/05/19   Authorization Type [employer]; no auth required [id number]    Authorization - Visit Number 17    Authorization - Number of Visits 30    Progress Note Due on Visit 19    PT Start Time 1135    PT Stop Time 1213    PT Time Calculation (min) 38 min    Activity Tolerance Patient tolerated treatment well    Behavior During Therapy WFL for tasks assessed/performed           Past Medical History:  Diagnosis Date  . Achilles tendinitis   . Arthritis   . Asthma   . Diverticulitis   . Diverticulosis   . GERD (gastroesophageal reflux disease)   . Heart murmur    as a child  . History of blood clots   . History of chicken pox   . History of pulmonary embolus (PE) 2018  . Hypertension   . Liver cirrhosis secondary to NASH (nonalcoholic steatohepatitis) ([place])   . Osteoarthritis   . Phlebitis   . Plantar fasciitis   . Vertigo     Past Surgical History:  Procedure Laterality Date  . ABDOMINAL HYSTERECTOMY    . bone spur removal    . KNEE ARTHROSCOPY Right   . KNEE ARTHROSCOPY Right   . 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   Procedure: TOTAL KNEE ARTHROPLASTY;  Surgeon: Paralee Cancel, MD;  Location: [facility];  Service: Orthopedics;  Laterality: Right;  70 mins    There were no vitals filed for this visit.   Subjective  Assessment - 08/24/19 1147    Subjective States concern about scar healing. States she still has rubber band then she bends knee and still feels tight after showering and immobility. States she drove over the weekend and she did have some medial knee pain. States she feels about 60% better.    Pertinent History hx of B DVT    Patient Stated Goals To have less pain    Currently in Pain? Yes    Pain Score 2     Pain Location Knee    Pain Orientation Right;Medial    Pain Descriptors / Indicators Aching    Pain Type Surgical pain    Pain Onset More than a month ago              [facility] PT Assessment - 08/24/19 0001      Assessment   Medical Diagnosis R TKA    Referring Provider (PT) Danae Orleans, [place]    Onset Date/Surgical Date 06/07/19    Next MD Visit 08/31/19      Observation/Other Assessments   Focus on Therapeutic Outcomes (FOTO)  49% function   was 36% function     Ambulation/Gait   Ambulation/Gait Yes    Ambulation/Gait Assistance  7: Independent    Ambulation Distance (Feet) 226 Feet    Assistive device None    Gait Pattern Step-through pattern;Decreased hip/knee flexion - right;Decreased dorsiflexion - right;Decreased weight shift to right    Stairs Yes    Stairs Assistance 7: Independent    Stair Management Technique No rails    Number of Stairs 4    Height of Stairs 7    Gait Comments alternating pattern                         [facility] Adult PT Treatment/Exercise - 08/24/19 0001      Knee/Hip Exercises: Supine   Knee Extension AROM;Right    Knee Extension Limitations 5   lacking   Knee Flexion AROM;Right    Knee Flexion Limitations 116                  PT Education - 08/24/19 1219    Education Details on FOTO score, on progress, on continued limitations, focus moving forward, concerns about scar and tightness.    Person(s) Educated Patient    Methods Explanation    Comprehension Verbalized understanding            PT Short Term  Goals - 07/26/19 1217      PT SHORT TERM GOAL #1   Title Patient will demonstrate at least 10-90 degrees of right knee ROM to demonstrate improved knee mobility    Baseline 10-96    Time 4    Period Weeks    Status Achieved    Target Date 07/11/19      PT SHORT TERM GOAL #2   Title Patient will report at least 25% improvement in overall symptoms and/or functional ability.    Baseline 60% better    Time 4    Period Weeks    Status Achieved    Target Date 07/11/19      PT SHORT TERM GOAL #3   Title Patient will be independent in self management strategies to improve quality of life and functional outcomes.    Time 4    Period Weeks    Status Achieved    Target Date 07/11/19             PT Long Term Goals - 08/24/19 1149      PT LONG TERM GOAL #1   Title Patient will be able to ascend and descend stairs with reciprocal gait pattern and use of railing as needed to improve ability to ascend and descend steps at home.    Baseline --    Time 8    Period Weeks    Status Achieved      PT LONG TERM GOAL #2   Title Patient will demonstrate 2-120 degrees of right knee ROM to demosntrate improved knee mobility    Baseline 10-110    Time 8    Period Weeks    Status On-going      PT LONG TERM GOAL #3   Title Patient will report at least 50% improvement in overall symptoms and/or functional ability.    Baseline 60%    Time 8    Period Weeks    Status Achieved      PT LONG TERM GOAL #4   Title Patient will be able to ambulate 2 minutes without major gait deviations and without assistive device to return to prior level of function    Baseline can walk 10 minutes without assistive device  Time 8    Period Weeks    Status Achieved      PT LONG TERM GOAL #5   Title Patient will be able to walk around for 10 minutes at a time without her cane to demonstrate improved walking endurance.    Time 5    Period Weeks    Status Achieved                 Plan - 08/24/19  1136    Clinical Impression Statement Progress note on this date. Patient has met all but one long term goal (ROM goal). Patient has also met predicted outcomes for FOTO measure. Discussed progress and plan moving forward. Will follow up with patient one additional time to focus on development of strong HEP with print offs with focus on ROM and strength. Answered all questions on this date. Anticipate patient to discharge from PT next session.    Personal Factors and Comorbidities Time since onset of injury/illness/exacerbation;Comorbidity 2;Fitness    Comorbidities Hx of DVT, HTN    Examination-Activity Limitations Stand;Locomotion Level;Transfers;Squat;Stairs;Bed Mobility;Bathing;Sit;Sleep;Caring for Others;Lift;Hygiene/Grooming;Dressing;Toileting    Examination-Participation Restrictions Driving;Community Activity;Cleaning;Shop;Meal Prep;Laundry    Stability/Clinical Decision Making Stable/Uncomplicated    Rehab Potential Good    PT Frequency 1x / week    PT Duration --   5 weeks   PT Treatment/Interventions ADLs/Self Care Home Management;Aquatic Therapy;Cryotherapy;Electrical Stimulation;Moist Heat;DME Instruction;Gait training;Stair training;Functional mobility training;Therapeutic activities;Therapeutic exercise;Balance training;Neuromuscular re-education;Patient/family education;Orthotic Fit/Training;Manual techniques;Passive range of motion;Dry needling;Energy conservation;Splinting;Taping;Scar mobilization    PT Next Visit Plan last visit - focus on developing updated HEP and printing off exercises (knee flexion and extension stretch, calf, hamstring, quad stretch, calf raise, stairs, STS, and other functional strengthening exercises)    PT Home Exercise Plan heel slides, stairs, knee AAROM seated flexion, walking, heel raises, heel walking, hamstring stretch, SLR, TKE ball against wall, SAQ, scar tissue massage    Consulted and Agree with Plan of Care Patient           Patient will  benefit from skilled therapeutic intervention in order to improve the following deficits and impairments:  Abnormal gait, Increased fascial restricitons, Improper body mechanics, Pain, Decreased mobility, Decreased activity tolerance, Decreased endurance, Decreased range of motion, Decreased strength, Hypomobility, Difficulty walking, Decreased knowledge of precautions, Decreased knowledge of use of DME, Decreased scar mobility, Increased edema, Decreased skin integrity, Decreased balance  Visit Diagnosis: Chronic pain of right knee  Muscle weakness (generalized)  Difficulty in walking, not elsewhere classified     Problem List Patient Active Problem List   Diagnosis Date Noted  . S/P right TKA 06/07/2019  . History of DVT (deep vein thrombosis) 04/26/2018  . Current use of long term anticoagulation 04/26/2018  . 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 Chronic pain of right knee 10/22/2017  . Right foot pain 10/22/2017  . Neck pain 10/22/2017  . Obesity (BMI 35.0-39.9 without comorbidity) 10/22/2017  . Healthcare maintenance 10/20/2017  . History of pulmonary embolus (PE) 10/20/2017  . GERD (gastroesophageal reflux disease) 09/08/2017  . Cough 08/11/2017  . Essential hypertension 08/11/2017  . Obstructive sleep apnea 08/11/2017    12:22 PM, 08/24/19 Jerene Pitch, DPT Physical Therapy with [facility]  [phone] office  [place] [address] [place], [place], [address] Phone: [phone]   Fax:  [phone]  Name: Isabel Garcia MRN: [id number] Date of Birth: [birth year]

## 2019-08-31 ENCOUNTER — Encounter (HOSPITAL_COMMUNITY): Payer: 59

## 2019-09-01 ENCOUNTER — Ambulatory Visit (HOSPITAL_COMMUNITY): Payer: 59

## 2019-09-01 ENCOUNTER — Other Ambulatory Visit: Payer: Self-pay

## 2019-09-01 DIAGNOSIS — M25561 Pain in right knee: Secondary | ICD-10-CM | POA: Diagnosis not present

## 2019-09-01 DIAGNOSIS — M6281 Muscle weakness (generalized): Secondary | ICD-10-CM

## 2019-09-01 DIAGNOSIS — G8929 Other chronic pain: Secondary | ICD-10-CM

## 2019-09-01 DIAGNOSIS — R262 Difficulty in walking, not elsewhere classified: Secondary | ICD-10-CM

## 2019-09-01 NOTE — Therapy (Deleted)
[facility] [address] [place], [place], [address] Phone: [phone]   Fax:  [phone]  Physical Therapy Treatment and Discharge Note  Patient Details  Name: DEWANDA FENNEMA MRN: [id number] Date of Birth: 1955/11/30 Referring Provider (PT): Danae Orleans, [place]   Encounter Date: 09/01/2019  PHYSICAL THERAPY DISCHARGE SUMMARY  Visits from Start of Care:18  Current functional level related to goals / functional outcomes: All but ROM goal met   Remaining deficits: ROM   Education / Equipment: See below Plan: Patient agrees to discharge.  Patient goals were partially met. Patient is being discharged due to meeting the stated rehab goals.  ?????         PT End of Session - 09/01/19 1724    Visit Number 18    Number of Visits 23   pt would like to be done by visit 20 per insurance   Date for PT Re-Evaluation 08/30/19   Eval: 06/13/19; PN:07/05/19   Authorization Type [employer]; no auth required [id number]    Authorization - Visit Number 18    Authorization - Number of Visits 30    Progress Note Due on Visit 19    PT Start Time 1700    PT Stop Time 1740    PT Time Calculation (min) 40 min    Activity Tolerance Patient tolerated treatment well    Behavior During Therapy WFL for tasks assessed/performed            Past Medical History:  Diagnosis Date  . Achilles tendinitis   . Arthritis   . Asthma   . Diverticulitis   . Diverticulosis   . GERD (gastroesophageal reflux disease)   . Heart murmur    as a child  . History of blood clots   . History of chicken pox   . History of pulmonary embolus (PE) 2018  . Hypertension   . Liver cirrhosis secondary to NASH (nonalcoholic steatohepatitis) ([place])   . Osteoarthritis   . Phlebitis   . Plantar fasciitis   . Vertigo     Past Surgical History:  Procedure Laterality Date  . ABDOMINAL HYSTERECTOMY    . bone spur removal    . KNEE ARTHROSCOPY Right   . KNEE ARTHROSCOPY  Right   . 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   Procedure: TOTAL KNEE ARTHROPLASTY;  Surgeon: Paralee Cancel, MD;  Location: [facility];  Service: Orthopedics;  Laterality: Right;  70 mins    There were no vitals filed for this visit.   Subjective Assessment - 09/01/19 1704    Subjective States that she still has difficulty getting up from low surfaces. States she has been working it and doing exercise and drove today. Reports no pain.    Pertinent History hx of B DVT    Patient Stated Goals To have less pain    Currently in Pain? No/denies    Pain Onset More than a month ago              [facility] PT Assessment - 09/01/19 0001      Assessment   Medical Diagnosis R TKA    Referring Provider (PT) Danae Orleans, PA    Onset Date/Surgical Date 06/07/19                         [facility] Adult PT Treatment/Exercise - 09/01/19 0001      Knee/Hip Exercises: Stretches   Active Hamstring Stretch Right;3 reps;30 seconds  Gastroc Stretch Both;3 reps;30 seconds    Other Knee/Hip Stretches knee drive stretch on step x10 20" holds      Knee/Hip Exercises: Seated   Sit to Sand without UE support;10 reps   slow lower, regular height chair                    PT Short Term Goals - 07/26/19 1217      PT SHORT TERM GOAL #1   Title Patient will demonstrate at least 10-90 degrees of right knee ROM to demonstrate improved knee mobility    Baseline 10-96    Time 4    Period Weeks    Status Achieved    Target Date 07/11/19      PT SHORT TERM GOAL #2   Title Patient will report at least 25% improvement in overall symptoms and/or functional ability.    Baseline 60% better    Time 4    Period Weeks    Status Achieved    Target Date 07/11/19      PT SHORT TERM GOAL #3   Title Patient will be independent in self management strategies to improve quality of life and functional outcomes.    Time 4    Period Weeks    Status Achieved    Target Date 07/11/19              PT Long Term Goals - 08/24/19 1149      PT LONG TERM GOAL #1   Title Patient will be able to ascend and descend stairs with reciprocal gait pattern and use of railing as needed to improve ability to ascend and descend steps at home.    Baseline --    Time 8    Period Weeks    Status Achieved      PT LONG TERM GOAL #2   Title Patient will demonstrate 2-120 degrees of right knee ROM to demosntrate improved knee mobility    Baseline 10-110    Time 8    Period Weeks    Status On-going      PT LONG TERM GOAL #3   Title Patient will report at least 50% improvement in overall symptoms and/or functional ability.    Baseline 60%    Time 8    Period Weeks    Status Achieved      PT LONG TERM GOAL #4   Title Patient will be able to ambulate 2 minutes without major gait deviations and without assistive device to return to prior level of function    Baseline can walk 10 minutes without assistive device    Time 8    Period Weeks    Status Achieved      PT LONG TERM GOAL #5   Title Patient will be able to walk around for 10 minutes at a time without her cane to demonstrate improved walking endurance.    Time 5    Period Weeks    Status Achieved                 Plan - 09/01/19 1705    Personal Factors and Comorbidities Time since onset of injury/illness/exacerbation;Comorbidity 2;Fitness    Comorbidities Hx of DVT, HTN    Examination-Activity Limitations Stand;Locomotion Level;Transfers;Squat;Stairs;Bed Mobility;Bathing;Sit;Sleep;Caring for Others;Lift;Hygiene/Grooming;Dressing;Toileting    Examination-Participation Restrictions Driving;Community Activity;Cleaning;Shop;Meal Prep;Laundry    Stability/Clinical Decision Making Stable/Uncomplicated    Rehab Potential Good    PT Frequency 1x / week    PT Duration --  5 weeks   PT Treatment/Interventions ADLs/Self Care Home Management;Aquatic Therapy;Cryotherapy;Electrical Stimulation;Moist Heat;DME Instruction;Gait  training;Stair training;Functional mobility training;Therapeutic activities;Therapeutic exercise;Balance training;Neuromuscular re-education;Patient/family education;Orthotic Fit/Training;Manual techniques;Passive range of motion;Dry needling;Energy conservation;Splinting;Taping;Scar mobilization    PT Next Visit Plan pt to DC from PT to HEP at this time    PT Home Exercise Plan heel slides, stairs, knee AAROM seated flexion, walking, heel raises, heel walking, hamstring stretch, SLR, TKE ball against wall, SAQ, scar tissue massage    Consulted and Agree with Plan of Care Patient           Patient will benefit from skilled therapeutic intervention in order to improve the following deficits and impairments:  Abnormal gait, Increased fascial restricitons, Improper body mechanics, Pain, Decreased mobility, Decreased activity tolerance, Decreased endurance, Decreased range of motion, Decreased strength, Hypomobility, Difficulty walking, Decreased knowledge of precautions, Decreased knowledge of use of DME, Decreased scar mobility, Increased edema, Decreased skin integrity, Decreased balance  Visit Diagnosis: Chronic pain of right knee  Muscle weakness (generalized)  Difficulty in walking, not elsewhere classified     Problem List Patient Active Problem List   Diagnosis Date Noted  . S/P right TKA 06/07/2019  . History of DVT (deep vein thrombosis) 04/26/2018  . Current use of long term anticoagulation 04/26/2018  . 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 Chronic pain of right knee 10/22/2017  . Right foot pain 10/22/2017  . Neck pain 10/22/2017  . Obesity (BMI 35.0-39.9 without comorbidity) 10/22/2017  . Healthcare maintenance 10/20/2017  . History of pulmonary embolus (PE) 10/20/2017  . GERD (gastroesophageal reflux disease) 09/08/2017  . Cough 08/11/2017  . Essential hypertension 08/11/2017  . Obstructive sleep apnea 08/11/2017   5:24 PM, 09/01/19 Jerene Pitch, DPT Physical Therapy with [facility]  [phone] office  [facility] [address] [place], [place], [address] Phone: [phone]   Fax:  [phone]  Name: LELANIA BIA MRN: [id number] Date of Birth: [birth year]

## 2019-09-01 NOTE — Therapy (Signed)
[facility] [address] [place], [place], [address] Phone: [phone]   Fax:  [phone]  Physical Therapy Treatment  Patient Details  Name: Isabel Garcia MRN: [id number] Date of Birth: 1955/10/17 Referring Provider (PT): Danae Orleans, PA  PHYSICAL THERAPY DISCHARGE SUMMARY  Visits from [facility] of [phone]  Current functional level related to goals / functional outcomes: See below   Remaining deficits: See below   Education / Equipment: See below  Plan: Patient agrees to discharge.  Patient goals were partially met. Patient is being discharged due to meeting the stated rehab goals.  ?????        8:01 AM, 09/02/19 Jerene Pitch, DPT Physical Therapy with [facility]  [phone] office   Encounter Date: 09/01/2019   PT End of Session - 09/01/19 1724    Visit Number 18    Number of Visits 23   pt would like to be done by visit 20 per insurance   Date for PT Re-Evaluation 08/30/19   Eval: 06/13/19; PN:07/05/19   Authorization Type [employer]; no auth required [id number]    Authorization - Visit Number 18    Authorization - Number of Visits 30    Progress Note Due on Visit 19    PT Start Time 1700    PT Stop Time 1743    PT Time Calculation (min) 43 min    Activity Tolerance Patient tolerated treatment well    Behavior During Therapy WFL for tasks assessed/performed           Past Medical History:  Diagnosis Date  . Achilles tendinitis   . Arthritis   . Asthma   . Diverticulitis   . Diverticulosis   . GERD (gastroesophageal reflux disease)   . Heart murmur    as a child  . History of blood clots   . History of chicken pox   . History of pulmonary embolus (PE) 2018  . Hypertension   . Liver cirrhosis secondary to NASH (nonalcoholic steatohepatitis) ([place])   . Osteoarthritis   . Phlebitis   . Plantar fasciitis   . Vertigo     Past Surgical History:  Procedure Laterality Date  . ABDOMINAL  HYSTERECTOMY    . bone spur removal    . KNEE ARTHROSCOPY Right   . KNEE ARTHROSCOPY Right   . 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   Procedure: TOTAL KNEE ARTHROPLASTY;  Surgeon: Paralee Cancel, MD;  Location: [facility];  Service: Orthopedics;  Laterality: Right;  70 mins    There were no vitals filed for this visit.   Subjective Assessment - 09/01/19 1704    Subjective States that she still has difficulty getting up from low surfaces. States she has been working it and doing exercise and drove today. Reports no pain.    Pertinent History hx of B DVT    Patient Stated Goals To have less pain    Currently in Pain? No/denies    Pain Onset More than a month ago              [facility] PT Assessment - 09/01/19 0001      Assessment   Medical Diagnosis R TKA    Referring Provider (PT) Danae Orleans, PA    Onset Date/Surgical Date 06/07/19                         [facility] Adult PT Treatment/Exercise - 09/01/19 0001  Knee/Hip Exercises: Stretches   Active Hamstring Stretch Right;3 reps;30 seconds    Knee: Self-Stretch to increase Flexion 10 seconds    Knee: Self-Stretch Limitations knee drive 2nd step 14" in height    Gastroc Stretch Both;3 reps;30 seconds    Other Knee/Hip Stretches --      Knee/Hip Exercises: Standing   Lateral Step Up 10 reps;Hand Hold: 1    Lateral Step Up Limitations 7in step height    Forward Step Up Left;10 reps;Hand Hold: 1    Forward Step Up Limitations 7in step height    Step Down 10 reps;Hand Hold: 2    Step Down Limitations 7in step height      Knee/Hip Exercises: Seated   Sit to Sand without UE support;10 reps   slow lower, regular height chair      Knee/Hip Exercises: Supine   Quad Sets 5 reps    Knee Extension AROM;Right    Knee Extension Limitations 7    Knee Flexion AROM;Right    Knee Flexion Limitations 117      Manual Therapy   Manual Therapy Myofascial release    Manual therapy comments all manual therapy completed  separately from other skilled interventions    Myofascial Release to reduce adhesions and scar tissue along scar line and superior to knee                  PT Education - 09/01/19 1806    Education Details Advanced HEP for DC; educated scar tissue massage technqiues for self care    Person(s) Educated Patient    Methods Explanation;Demonstration    Comprehension Verbalized understanding;Returned demonstration            PT Short Term Goals - 09/01/19 1810      PT SHORT TERM GOAL #1   Title Patient will demonstrate at least 10-90 degrees of right knee ROM to demonstrate improved knee mobility    Baseline 09/01/19: 7-117; was 10-96 on 07/26/19    Status Achieved      PT SHORT TERM GOAL #2   Title Patient will report at least 25% improvement in overall symptoms and/or functional ability.    Baseline 09/01/19: reports she feels 90% improvements; (on 07/26/19 60% better).    Status Achieved             PT Long Term Goals - 09/01/19 1811      PT LONG TERM GOAL #1   Title Patient will be able to ascend and descend stairs with reciprocal gait pattern and use of railing as needed to improve ability to ascend and descend steps at home.    Baseline Able to demonstrate reciprocal pattern ascending and descending on 7in step height    Status Achieved      PT LONG TERM GOAL #2   Title Patient will demonstrate 2-120 degrees of right knee ROM to demosntrate improved knee mobility    Baseline 09/01/19: 7-117; was 10-96 on 07/26/19    Status On-going      PT LONG TERM GOAL #3   Title Patient will report at least 50% improvement in overall symptoms and/or functional ability.    Baseline 09/01/19: reports she feels 90% improvements; (on 07/26/19 60% better).    Status Achieved      PT LONG TERM GOAL #4   Title Patient will be able to ambulate 2 minutes without major gait deviations and without assistive device to return to prior level of function    Baseline can walk  10 minutes without  assistive device    Status Achieved      PT LONG TERM GOAL #5   Title Patient will be able to walk around for 10 minutes at a time without her cane to demonstrate improved walking endurance.    Status Achieved                 Plan - 09/01/19 1705    Clinical Impression Statement Session began by PT who reviewed goals, FOTO (complete last session), and established advanced HEP prior DC.  Reviewed exsercises to assure correct form at home, pt able to demonstrate appropriate mechanics following initial instructions with all current HEP.  Pt continues to have myofascial restriciton along incision, educated scar tissue massage for home use.  Pt able to demonstrate and verbalize mechanics.  AROM 7-117 degrees.    Personal Factors and Comorbidities Time since onset of injury/illness/exacerbation;Comorbidity 2;Fitness    Comorbidities Hx of DVT, HTN    Examination-Activity Limitations Stand;Locomotion Level;Transfers;Squat;Stairs;Bed Mobility;Bathing;Sit;Sleep;Caring for Others;Lift;Hygiene/Grooming;Dressing;Toileting    Examination-Participation Restrictions Driving;Community Activity;Cleaning;Shop;Meal Prep;Laundry    Stability/Clinical Decision Making Stable/Uncomplicated    Rehab Potential Good    PT Frequency 1x / week    PT Duration --   5 weeks   PT Treatment/Interventions ADLs/Self Care Home Management;Aquatic Therapy;Cryotherapy;Electrical Stimulation;Moist Heat;DME Instruction;Gait training;Stair training;Functional mobility training;Therapeutic activities;Therapeutic exercise;Balance training;Neuromuscular re-education;Patient/family education;Orthotic Fit/Training;Manual techniques;Passive range of motion;Dry needling;Energy conservation;Splinting;Taping;Scar mobilization    PT Next Visit Plan pt to DC from PT to HEP at this time    PT Home Exercise Plan heel slides, stairs, knee AAROM seated flexion, walking, heel raises, heel walking, hamstring stretch, SLR, TKE ball against wall,  SAQ, scar tissue massage    Consulted and Agree with Plan of Care Patient           Patient will benefit from skilled therapeutic intervention in order to improve the following deficits and impairments:  Abnormal gait, Increased fascial restricitons, Improper body mechanics, Pain, Decreased mobility, Decreased activity tolerance, Decreased endurance, Decreased range of motion, Decreased strength, Hypomobility, Difficulty walking, Decreased knowledge of precautions, Decreased knowledge of use of DME, Decreased scar mobility, Increased edema, Decreased skin integrity, Decreased balance  Visit Diagnosis: Chronic pain of right knee  Muscle weakness (generalized)  Difficulty in walking, not elsewhere classified     Problem List Patient Active Problem List   Diagnosis Date Noted  . S/P right TKA 06/07/2019  . History of DVT (deep vein thrombosis) 04/26/2018  . Current use of long term anticoagulation 04/26/2018  . 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 Chronic pain of right knee 10/22/2017  . Right foot pain 10/22/2017  . Neck pain 10/22/2017  . Obesity (BMI 35.0-39.9 without comorbidity) 10/22/2017  . Healthcare maintenance 10/20/2017  . History of pulmonary embolus (PE) 10/20/2017  . GERD (gastroesophageal reflux disease) 09/08/2017  . Cough 08/11/2017  . Essential hypertension 08/11/2017  . Obstructive sleep apnea 08/11/2017   Ihor Austin, LPTA/CLT; CBIS [phone]  Aldona Lento 09/01/2019, 6:18 PM  [facility] [place], [place], [address] Phone: [phone]   Fax:  [phone]  Name: Isabel Garcia MRN: [id number] Date of Birth: Aug 20, 1955

## 2019-09-06 ENCOUNTER — Other Ambulatory Visit: Payer: Self-pay

## 2019-09-06 ENCOUNTER — Ambulatory Visit (HOSPITAL_COMMUNITY)
Admission: RE | Admit: 2019-09-06 | Discharge: 2019-09-06 | Disposition: A | Discharge status: Home / Self Care | Payer: 59 | Source: Ambulatory Visit | Attending: Gastroenterology | Admitting: Gastroenterology

## 2019-09-06 DIAGNOSIS — K760 Fatty (change of) liver, not elsewhere classified: Secondary | ICD-10-CM | POA: Insufficient documentation

## 2019-09-07 ENCOUNTER — Encounter (HOSPITAL_COMMUNITY): Payer: 59 | Admitting: Physical Therapy

## 2019-09-13 ENCOUNTER — Emergency Department (HOSPITAL_COMMUNITY): Payer: 59

## 2019-09-13 ENCOUNTER — Emergency Department (HOSPITAL_COMMUNITY)
Admission: EM | Admit: 2019-09-13 | Discharge: 2019-09-13 | Disposition: A | Discharge status: Home / Self Care | Payer: 59 | Attending: Emergency Medicine | Admitting: Emergency Medicine

## 2019-09-13 ENCOUNTER — Other Ambulatory Visit: Payer: Self-pay

## 2019-09-13 ENCOUNTER — Encounter (HOSPITAL_COMMUNITY): Payer: Self-pay | Admitting: Emergency Medicine

## 2019-09-13 DIAGNOSIS — M26629 Arthralgia of temporomandibular joint, unspecified side: Secondary | ICD-10-CM | POA: Diagnosis not present

## 2019-09-13 DIAGNOSIS — Z7901 Long term (current) use of anticoagulants: Secondary | ICD-10-CM | POA: Insufficient documentation

## 2019-09-13 DIAGNOSIS — M542 Cervicalgia: Secondary | ICD-10-CM | POA: Diagnosis not present

## 2019-09-13 DIAGNOSIS — Z7951 Long term (current) use of inhaled steroids: Secondary | ICD-10-CM | POA: Insufficient documentation

## 2019-09-13 DIAGNOSIS — M25571 Pain in right ankle and joints of right foot: Secondary | ICD-10-CM | POA: Diagnosis not present

## 2019-09-13 DIAGNOSIS — J45909 Unspecified asthma, uncomplicated: Secondary | ICD-10-CM | POA: Insufficient documentation

## 2019-09-13 DIAGNOSIS — I10 Essential (primary) hypertension: Secondary | ICD-10-CM | POA: Insufficient documentation

## 2019-09-13 DIAGNOSIS — Z79899 Other long term (current) drug therapy: Secondary | ICD-10-CM | POA: Diagnosis not present

## 2019-09-13 DIAGNOSIS — Y939 Activity, unspecified: Secondary | ICD-10-CM | POA: Insufficient documentation

## 2019-09-13 DIAGNOSIS — Y929 Unspecified place or not applicable: Secondary | ICD-10-CM | POA: Diagnosis not present

## 2019-09-13 DIAGNOSIS — M25561 Pain in right knee: Secondary | ICD-10-CM | POA: Insufficient documentation

## 2019-09-13 DIAGNOSIS — M549 Dorsalgia, unspecified: Secondary | ICD-10-CM | POA: Diagnosis not present

## 2019-09-13 DIAGNOSIS — W28XXXA Contact with powered lawn mower, initial encounter: Secondary | ICD-10-CM | POA: Diagnosis not present

## 2019-09-13 DIAGNOSIS — M25519 Pain in unspecified shoulder: Secondary | ICD-10-CM | POA: Diagnosis not present

## 2019-09-13 DIAGNOSIS — Y999 Unspecified external cause status: Secondary | ICD-10-CM | POA: Insufficient documentation

## 2019-09-13 DIAGNOSIS — W19XXXA Unspecified fall, initial encounter: Secondary | ICD-10-CM

## 2019-09-13 MED ORDER — ACETAMINOPHEN 325 MG PO TABS
650.0000 mg | ORAL_TABLET | Freq: Once | ORAL | Status: AC
  Administered 2019-09-13: 650 mg via ORAL
  Filled 2019-09-13: qty 2

## 2019-09-13 MED ORDER — LIDOCAINE 5 % EX PTCH: 1.0000 | MEDICATED_PATCH | CUTANEOUS | 0 refills | Status: AC

## 2019-09-13 MED ORDER — LIDOCAINE 5 % EX PTCH: 1.0000 | MEDICATED_PATCH | CUTANEOUS | Status: DC

## 2019-09-13 NOTE — Discharge Instructions (Addendum)
You may experience aches and pains after your fall. Please use the knee sleeve when walking. Please take Tylenol or Ibuprofen for pain. Please have someone monitor you tonight to make sure you do not have any worsening confusion. Return to the ER if you have worsening headache, confusion, or any other new or concerning symptoms. Please follow up with your orthopedic doctor for your knee.

## 2019-09-13 NOTE — ED Provider Notes (Signed)
[facility] DEPT Provider Note   CSN: [id number] Arrival date & time: 09/13/19  1037     History Chief Complaint  Patient presents with  . Fall    Isabel Garcia is a 64 y.o. female.  HPI 64 year old female with a history of asthma, diverticulitis, GERD, PE on Eliquis, s/p right TKA presents to the ER after a mechanical pall when she was getting [facility] [place]. Reports falling backward and hitting her head.  Endorses some neck pain and pain across her shoulders, no LOC. States that her R knee twisted when she fell, reports right knee and ankle pain.  She is concerned about her right TKA.  Denies any chest pain or shortness of breath.  No groin numbness, no numbness or tingling in her lower extremities, no loss of bowel bladder control.  No nausea or vomiting.  No abdominal pain.  No headache or dizziness.    Past Medical History:  Diagnosis Date  . Achilles tendinitis   . Arthritis   . Asthma   . Diverticulitis   . Diverticulosis   . GERD (gastroesophageal reflux disease)   . Heart murmur    as a child  . History of blood clots   . History of chicken pox   . History of pulmonary embolus (PE) 2018  . Hypertension   . Liver cirrhosis secondary to NASH (nonalcoholic steatohepatitis) ([place])   . Osteoarthritis   . Phlebitis   . Plantar fasciitis   . Vertigo     Patient Active Problem List   Diagnosis Date Noted  . S/P right TKA 06/07/2019  . History of DVT (deep vein thrombosis) 04/26/2018  . Current use of long term anticoagulation 04/26/2018  . 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 Chronic pain of right knee 10/22/2017  . Right foot pain 10/22/2017  . Neck pain 10/22/2017  . Obesity (BMI 35.0-39.9 without comorbidity) 10/22/2017  . Healthcare maintenance 10/20/2017  . History of pulmonary embolus (PE) 10/20/2017  . GERD (gastroesophageal reflux disease) 09/08/2017  . Cough  08/11/2017  . Essential hypertension 08/11/2017  . Obstructive sleep apnea 08/11/2017    Past Surgical History:  Procedure Laterality Date  . ABDOMINAL HYSTERECTOMY    . bone spur removal    . KNEE ARTHROSCOPY Right   . KNEE ARTHROSCOPY Right   . 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   Procedure: TOTAL KNEE ARTHROPLASTY;  Surgeon: Paralee Cancel, MD;  Location: [facility];  Service: Orthopedics;  Laterality: Right;  70 mins     OB History   No obstetric history on file.     Family History  Problem Relation Age of Onset  . Arthritis Mother   . Hypertension Mother   . Hypertension Father   . Heart attack Father   . Early death Father   . Asthma Sister   . Heart attack Brother   . Early death Brother   . Hypertension Brother   . Stroke Maternal Grandmother   . Heart attack Maternal Grandfather   . Heart attack Paternal Grandfather   . Early death Paternal Grandfather   . Colon cancer Neg Hx   . Stomach cancer Neg Hx   . Pancreatic cancer Neg Hx   . Esophageal cancer Neg Hx     Social History   Tobacco Use  . Smoking status: Never Smoker  . Smokeless tobacco: Never Used  Vaping Use  . Vaping Use: Never used  Substance Use Topics  . Alcohol use: Not Currently  . Drug use: No    Home Medications Prior to Admission medications   Medication Sig Start Date End Date Taking? Authorizing Provider  albuterol (PROVENTIL HFA) 108 (90 Base) MCG/ACT inhaler Inhale 2 puffs into the lungs every 6 (six) hours as needed for wheezing or shortness of breath.  03/02/17   [provider]  amLODipine (NORVASC) 10 MG tablet Take 1 tablet (10 mg total) by mouth daily. for high blood pressure 04/12/19   Marrian Salvage, FNP  atorvastatin (LIPITOR) 20 MG tablet Take 1 tablet (20 mg total) by mouth daily. Patient taking differently: Take 20 mg by mouth at bedtime.  04/18/19   Marrian Salvage, FNP  azelastine (ASTELIN) 0.1 % nasal spray Place 1 spray into both nostrils 2  (two) times daily. Use in each nostril as directed 08/12/19   Chesley Mires, MD  cholecalciferol (VITAMIN D3) 25 MCG (1000 UNIT) tablet Take 1,000 Units by mouth daily.    [provider]  COD LIVER OIL PO Take 1 capsule by mouth daily.    [provider]  ELIQUIS 5 MG TABS tablet Take 1 tablet by mouth twice daily 07/01/19   Chesley Mires, MD  fluticasone (FLONASE) 50 MCG/ACT nasal spray Place 2 sprays into both nostrils daily. 04/12/19   Marrian Salvage, FNP  Fluticasone-Salmeterol (ADVAIR DISKUS) 250-50 MCG/DOSE AEPB Inhale 1 puff into the lungs 2 (two) times daily. 05/27/19   Chesley Mires, MD  Iron-Vitamin C (IRON 100/C) 100-250 MG TABS Take by mouth.    [provider]  lidocaine (LIDODERM) 5 % Place 1 patch onto the skin daily. Remove & Discard patch within 12 hours or as directed by MD 09/13/19   Garald Balding, PA-C  montelukast (SINGULAIR) 10 MG tablet Take 1 tablet (10 mg total) by mouth at bedtime. 04/12/19   Marrian Salvage, FNP  omeprazole (PRILOSEC) 40 MG capsule Take 1 capsule (40 mg total) by mouth daily. 04/12/19   Marrian Salvage, FNP  Zinc Sulfate (ZINC 15 PO) Take 1 capsule by mouth daily.    [provider]    Allergies    Patient has no known allergies.  Review of Systems   Review of Systems  Constitutional: Negative for chills and fever.  HENT: Negative for ear pain and sore throat.   Eyes: Negative for pain and visual disturbance.  Respiratory: Negative for cough and shortness of breath.   Cardiovascular: Negative for chest pain and palpitations.  Gastrointestinal: Negative for abdominal pain and vomiting.  Genitourinary: Negative for dysuria and hematuria.  Musculoskeletal: Positive for arthralgias, back pain, joint swelling (Right knee) and neck pain. Negative for neck stiffness.  Skin: Negative for color change and rash.  Neurological: Negative for seizures and syncope.  All other systems reviewed and are  negative.   Physical Exam Updated Vital Signs BP 136/83 (BP Location: Right Arm)   Pulse 72   Temp 97.7 F (36.5 C)   Resp 16   Ht 5' 7"  (1.702 m)   Wt 100 kg   SpO2 100%   BMI 34.53 kg/m   Physical Exam Vitals reviewed.  Constitutional:      General: She is not in acute distress.    Appearance: Normal appearance. She is not ill-appearing, toxic-appearing or diaphoretic.  HENT:     Head: Normocephalic and atraumatic.     Nose: Nose normal.     Mouth/Throat:     Pharynx: Oropharynx  is clear.  Eyes:     General:        Right eye: No discharge.        Left eye: No discharge.     Extraocular Movements: Extraocular movements intact.     Conjunctiva/sclera: Conjunctivae normal.  Cardiovascular:     Rate and Rhythm: Normal rate and regular rhythm.     Pulses: Normal pulses.     Heart sounds: Normal heart sounds.  Pulmonary:     Effort: Pulmonary effort is normal.     Breath sounds: Normal breath sounds.  Abdominal:     General: Abdomen is flat.  Musculoskeletal:        General: Swelling and tenderness present. Normal range of motion.     Cervical back: Normal range of motion.     Comments: Right knee with mild to moderate swelling. ROM decreased secondary to pain. No evidence of overlying erythema. Right ankle with full ROM, mild edema. 2+ DP pulses. Paraspinal muscle tenderness to trapezius muscles, no stepoffs ,crepitus. Moving all 4 extremities. Full ROM of neck. Gross sensations intact. Pt was able to take a few steps in the ED though with pain. C Spine TTP, no T or LSPINE tenderness    Skin:    Findings: No erythema.  Neurological:     General: No focal deficit present.     Mental Status: She is alert and oriented to person, place, and time.     Sensory: No sensory deficit.     Motor: No weakness.  Psychiatric:        Mood and Affect: Mood normal.        Behavior: Behavior normal.     ED Results / Procedures / Treatments   Labs (all labs ordered are listed,  but only abnormal results are displayed) Labs Reviewed - No data to display  EKG None  Radiology DG Ankle Complete Right  Result Date: 09/13/2019 CLINICAL DATA:  Recent fall with ankle pain, initial encounter EXAM: RIGHT ANKLE - COMPLETE 3+ VIEW COMPARISON:  None. FINDINGS: Mild soft tissue swelling is noted medially. No acute fracture or dislocation is noted. Calcaneal spurring and tarsal degenerative changes are seen. IMPRESSION: No acute bony abnormality noted. Electronically Signed   By: Inez Catalina M.D.   On: 09/13/2019 12:43   CT Head Wo Contrast  Result Date: 09/13/2019 CLINICAL DATA:  Fall, head injury, headache, neck pain EXAM: CT HEAD WITHOUT CONTRAST CT CERVICAL SPINE WITHOUT CONTRAST TECHNIQUE: Multidetector CT imaging of the head and cervical spine was performed following the standard protocol without intravenous contrast. Multiplanar CT image reconstructions of the cervical spine were also generated. COMPARISON:  None. FINDINGS: CT HEAD FINDINGS Brain: Normal anatomic configuration. No abnormal intra or extra-axial mass lesion or fluid collection. No abnormal mass effect or midline shift. No evidence of acute intracranial hemorrhage or infarct. Ventricular size is normal. Cerebellum unremarkable. Vascular: Unremarkable Skull: Intact Sinuses/Orbits: Paranasal sinuses are clear. Orbits are unremarkable. Other: Mastoid air cells and middle ear cavities are clear. CT CERVICAL SPINE FINDINGS Alignment: Normal. Skull base and vertebrae: No acute fracture. No primary bone lesion or focal pathologic process. Soft tissues and spinal canal: No prevertebral fluid or swelling. No visible canal hematoma. 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 Disc levels: Vertebral body height  has been preserved. Intervertebral disc height has been preserved. There is mild endplate remodeling at O7-F6 in keeping with changes of mild degenerative disc disease. No significant facet arthrosis. No significant neural foraminal narrowing. Upper chest: Negative. Other: None significant IMPRESSION: No acute intracranial injury.  No calvarial fracture. No acute cervical spine fracture. 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 Electronically Signed   By: Fidela Salisbury MD   On: 09/13/2019 16:08   CT Cervical Spine Wo Contrast  Result Date: 09/13/2019 CLINICAL DATA:  Fall, head injury, headache, neck pain EXAM: CT HEAD WITHOUT CONTRAST CT CERVICAL SPINE WITHOUT CONTRAST TECHNIQUE: Multidetector CT imaging of the head and cervical spine was performed following the standard protocol without intravenous contrast. Multiplanar CT image reconstructions of the cervical spine were also generated. COMPARISON:  None. FINDINGS: CT HEAD FINDINGS Brain: Normal anatomic configuration. No abnormal intra or extra-axial mass lesion or fluid collection. No abnormal mass effect or midline shift. No evidence of acute intracranial hemorrhage or infarct. Ventricular size is normal. Cerebellum unremarkable. Vascular: Unremarkable Skull: Intact Sinuses/Orbits: Paranasal sinuses are clear. Orbits are unremarkable. Other: Mastoid air cells and middle ear cavities are clear. CT CERVICAL SPINE FINDINGS Alignment: Normal. Skull base and vertebrae: No acute fracture. No primary bone lesion or focal pathologic process. Soft tissues and spinal canal: No prevertebral fluid or swelling. No visible canal hematoma. 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 Disc levels: Vertebral body height has been preserved. Intervertebral disc height has been preserved. There is mild endplate remodeling at E3-P2 in keeping with changes of mild degenerative disc disease. No significant facet arthrosis. No significant neural foraminal narrowing. Upper chest: Negative. Other: None significant IMPRESSION: No acute intracranial injury.  No calvarial fracture. No acute cervical spine fracture. 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 Electronically Signed   By: Fidela Salisbury MD   On: 09/13/2019 16:08   DG Knee Complete 4 Views Right  Result Date: 09/13/2019 CLINICAL DATA:  Recent fall with right knee pain, initial encounter EXAM: RIGHT KNEE - COMPLETE 4+ VIEW COMPARISON:  None. FINDINGS: Right knee replacement is noted. Mild soft tissue swelling is noted. Patellofemoral degenerative changes are seen. No definitive acute fracture is noted. IMPRESSION: Postsurgical and degenerative change. No acute posttraumatic abnormality is noted. Electronically Signed   By: Inez Catalina M.D.   On: 09/13/2019 12:42   DG Hip Unilat  With Pelvis 2-3 Views Right  Result Date: 09/13/2019 CLINICAL DATA:  Right hip pain after fall EXAM: DG HIP (WITH OR WITHOUT PELVIS) 2-3V RIGHT COMPARISON:  None. FINDINGS: There is no evidence of hip fracture or dislocation. There is no evidence of arthropathy or other focal bone abnormality. IMPRESSION: Negative. Electronically Signed   By: Davina Poke D.O.   On: 09/13/2019 12:44    Procedures Procedures (including critical care time)  Medications Ordered in ED Medications  acetaminophen (TYLENOL) tablet 650 mg (650 mg Oral Given 09/13/19 1603)    ED Course  I have reviewed the triage vital signs and the nursing notes.  Pertinent labs & imaging results that were available during my  care  of the patient were reviewed by me and considered in my medical decision making (see chart for details).    MDM Rules/Calculators/A&P                         64 year old female with mechanical fall on Eliquis Patient reports no LOC, however she had some midline tenderness to her C-spine and paraspinal muscle tenderness across her trapezius muscles bilaterally. Her right knee does appear to be slightly swollen, patient was able to ambulate, however with pain. CT of the head and neck without evidence of intracranial or cervical injury. Plain films of the hip, ankle and right knee without evidence of trauma. Right knee appears to be without evidence of fracture or destabilization of the knee replacement. Patient's main concern is her knee as she states that this is giving her the most pain. Given Tylenol in the ED. She denies any chest pain or shortness of breath, no headache or dizziness. Alert and oriented x3. Discussed Tylenol, brace, following up with [facility] who did her knee replacement. Patient states that Voltaren gel does not work, will provide Lidoderm patch. Patient was instructed to have someone spend the night with her tonight and watch her mental status. If she has any worsening headache, confusion, she was instructed to return to the ER. Overall work-up reassuring. Lab work from May does not show any evidence of thrombocytopenia, do not think any additional lab work is needed at this time. Patient voices understanding and is agreeable to this plan. Overall the patient is medically screened and is stable for discharge.  Final Clinical Impression(s) / ED Diagnoses Final diagnoses:  Fall, initial encounter    Rx / DC Orders ED Discharge Orders         Ordered    lidocaine (LIDODERM) 5 %  Every 24 hours     Discontinue  Reprint     09/13/19 1625           Garald Balding, PA-C 09/13/19 1643    Quintella Reichert, MD 09/13/19 [phone]

## 2019-09-13 NOTE — ED Triage Notes (Addendum)
[facility], C/C fall coming out of the pool, she slipped, struck her head on the back of the floor, denies LOC or neck pain. Twisted her R leg, knee replacement of R knee in May 2021. Is on blood thinners.

## 2019-09-13 NOTE — ED Notes (Signed)
Patient transported to CT 

## 2019-09-21 ENCOUNTER — Ambulatory Visit (HOSPITAL_COMMUNITY): Payer: 59 | Admitting: Physical Therapy

## 2019-09-21 ENCOUNTER — Encounter: Payer: Self-pay | Admitting: Gastroenterology

## 2019-09-26 ENCOUNTER — Telehealth: Payer: Self-pay | Admitting: Gastroenterology

## 2019-09-26 NOTE — Telephone Encounter (Signed)
Spoke to pt.  She took her Eliquis this morning. Per SA, OK to proceed with colonoscopy since guidelines are 1 to 2 days.  Called pt back and let her know.  She expressed understanding and knows not to take it again until after her procedure when instructed.

## 2019-09-26 NOTE — Telephone Encounter (Signed)
Pt is requesting a call back from a nurse to know if she should stop taking her Eliquis.

## 2019-09-28 ENCOUNTER — Other Ambulatory Visit: Payer: Self-pay

## 2019-09-28 ENCOUNTER — Ambulatory Visit (AMBULATORY_SURGERY_CENTER): Payer: 59 | Admitting: Gastroenterology

## 2019-09-28 ENCOUNTER — Encounter: Payer: Self-pay | Admitting: Gastroenterology

## 2019-09-28 VITALS — BP 142/76 | HR 56 | Temp 96.6°F | Resp 17 | Ht 67.0 in | Wt 222.0 lb

## 2019-09-28 DIAGNOSIS — K635 Polyp of colon: Secondary | ICD-10-CM | POA: Diagnosis not present

## 2019-09-28 DIAGNOSIS — D127 Benign neoplasm of rectosigmoid junction: Secondary | ICD-10-CM

## 2019-09-28 DIAGNOSIS — Z860100 Personal history of colon polyps, unspecified: Secondary | ICD-10-CM

## 2019-09-28 DIAGNOSIS — D123 Benign neoplasm of transverse colon: Secondary | ICD-10-CM

## 2019-09-28 MED ORDER — SODIUM CHLORIDE 0.9 % IV SOLN: 500.0000 mL | Freq: Once | INTRAVENOUS | Status: DC

## 2019-09-28 NOTE — Progress Notes (Signed)
pt tolerated well. VSS. awake and to recovery. Report given to RN.  

## 2019-09-28 NOTE — Progress Notes (Signed)
VS- Isabel Garcia  Pt states she has completed covid vaccines  Knee brace- right knee on  Called to room to assist during endoscopic procedure.  Patient ID and intended procedure confirmed with present staff. Received instructions for my participation in the procedure from the performing physician.

## 2019-09-28 NOTE — Patient Instructions (Signed)
Handouts given for polyps and diverticulosis.  Resume your Eliquis tomorrow.  Await pathology results.  YOU HAD AN ENDOSCOPIC PROCEDURE TODAY AT [facility]:   Refer to the procedure report that was given to you for any specific questions about what was found during the examination.  If the procedure report does not answer your questions, please call your gastroenterologist to clarify.  If you requested that your care partner not be given the details of your procedure findings, then the procedure report has been included in a sealed envelope for you to review at your convenience later.  YOU SHOULD EXPECT: Some feelings of bloating in the abdomen. Passage of more gas than usual.  Walking can help get rid of the air that was put into your GI tract during the procedure and reduce the bloating. If you had a lower endoscopy (such as a colonoscopy or flexible sigmoidoscopy) you may notice spotting of blood in your stool or on the toilet paper. If you underwent a bowel prep for your procedure, you may not have a normal bowel movement for a few days.  Please Note:  You might notice some irritation and congestion in your nose or some drainage.  This is from the oxygen used during your procedure.  There is no need for concern and it should clear up in a day or so.  SYMPTOMS TO REPORT IMMEDIATELY:   Following lower endoscopy (colonoscopy or flexible sigmoidoscopy):  Excessive amounts of blood in the stool  Significant tenderness or worsening of abdominal pains  Swelling of the abdomen that is new, acute  Fever of 100F or higher  For urgent or emergent issues, a gastroenterologist can be reached at any hour by calling [phone]. Do not use MyChart messaging for urgent concerns.    DIET:  We do recommend a small meal at first, but then you may proceed to your regular diet.  Drink plenty of fluids but you should avoid alcoholic beverages for 24 hours.  ACTIVITY:  You should plan  to take it easy for the rest of today and you should NOT DRIVE or use heavy machinery until tomorrow (because of the sedation medicines used during the test).    FOLLOW UP: Our staff will call the number listed on your records 48-72 hours following your procedure to check on you and address any questions or concerns that you may have regarding the information given to you following your procedure. If we do not reach you, we will leave a message.  We will attempt to reach you two times.  During this call, we will ask if you have developed any symptoms of COVID 19. If you develop any symptoms (ie: fever, flu-like symptoms, shortness of breath, cough etc.) before then, please call [phone].  If you test positive for Covid 19 in the 2 weeks post procedure, please call and report this information to [place].    If any biopsies were taken you will be contacted by phone or by letter within the next 1-3 weeks.  Please call us at [phone] if you have not heard about the biopsies in 3 weeks.    SIGNATURES/CONFIDENTIALITY: You and/or your care partner have signed paperwork which will be entered into your electronic medical record.  These signatures attest to the fact that that the information above on your After Visit Summary has been reviewed and is understood.  Full responsibility of the confidentiality of this discharge information lies with you and/or your care-partner.

## 2019-09-28 NOTE — Op Note (Addendum)
[facility] Patient Name: Isabel Garcia Procedure Date: 09/28/2019 9:49 AM MRN: [id number] Endoscopist: Remo Lipps P. Havery Moros , MD Age: 64 Referring MD:  Date of Birth: 22-Oct-1955 Gender: Female Account #: [id number] Procedure:                Colonoscopy Indications:              High risk colon cancer surveillance: Personal                            history of colonic polyps Medicines:                Monitored Anesthesia Care Procedure:                Pre-Anesthesia Assessment:                           - Prior to the procedure, a History and Physical                            was performed, and patient medications and                            allergies were reviewed. The patient's tolerance of                            previous anesthesia was also reviewed. The risks                            and benefits of the procedure and the sedation                            options and risks were discussed with the patient.                            All questions were answered, and informed consent                            was obtained. Prior Anticoagulants: The patient has                            taken Eliquis (apixaban), last dose was 2 days                            prior to procedure. ASA Grade Assessment: III - A                            patient with severe systemic disease. After                            reviewing the risks and benefits, the patient was                            deemed in satisfactory condition to undergo the  procedure.                           After obtaining informed consent, the colonoscope                            was passed under direct vision. Throughout the                            procedure, the patient's blood pressure, pulse, and                            oxygen saturations were monitored continuously. The                            Colonoscope was introduced through the anus and                             advanced to the the cecum, identified by                            appendiceal orifice and ileocecal valve. The                            colonoscopy was performed without difficulty. The                            patient tolerated the procedure well. The quality                            of the bowel preparation was good. The ileocecal                            valve, appendiceal orifice, and rectum were                            photographed. Scope In: 10:00:07 AM Scope Out: 10:23:28 AM Scope Withdrawal Time: 0 hours 19 minutes 28 seconds  Total Procedure Duration: 0 hours 23 minutes 21 seconds  Findings:                 The perianal and digital rectal examinations were                            normal.                           A 3 mm polyp was found in the hepatic flexure. The                            polyp was sessile. The polyp was removed with a                            cold snare. Resection and retrieval were complete.  A 3 mm polyp was found in the transverse colon. The                            polyp was sessile. The polyp was removed with a                            cold snare. Resection and retrieval were complete.                           A 4 mm polyp was found in the sigmoid colon. The                            polyp was flat. The polyp was removed with a cold                            snare. Resection and retrieval were complete.                           A 3 mm polyp was found in the recto-sigmoid colon.                            The polyp was sessile. The polyp was removed with a                            cold snare. Resection and retrieval were complete.                           Multiple medium-mouthed diverticula were found in                            the left colon.                           The exam was otherwise without abnormality.                            Retroflexed views were limited due to small rectum                             size. Complications:            No immediate complications. Estimated blood loss:                            Minimal. Estimated Blood Loss:     Estimated blood loss was minimal. Impression:               - One 3 mm polyp at the hepatic flexure, removed                            with a cold snare. Resected and retrieved.                           - One 3 mm  polyp in the transverse colon, removed                            with a cold snare. Resected and retrieved.                           - One 4 mm polyp in the sigmoid colon, removed with                            a cold snare. Resected and retrieved.                           - One 3 mm polyp at the recto-sigmoid colon,                            removed with a cold snare. Resected and retrieved.                           - Diverticulosis in the left colon.                           - The examination was otherwise normal. Recommendation:           - Patient has a contact number available for                            emergencies. The signs and symptoms of potential                            delayed complications were discussed with the                            patient. Return to normal activities tomorrow.                            Written discharge instructions were provided to the                            patient.                           - Resume previous diet.                           - Continue present medications.                           - Resume Eliquis tomorrow                           - Await pathology results. Remo Lipps P. Cleave Ternes, MD 09/28/2019 10:31:01 AM This report has been signed electronically.

## 2019-09-30 ENCOUNTER — Telehealth: Payer: Self-pay

## 2019-09-30 NOTE — Telephone Encounter (Signed)
°  Follow up Call-  Call back number 09/28/2019  Post procedure Call Back phone  # [phone]  Permission to leave phone message Yes  Some recent data might be hidden     Patient questions:  Do you have a fever, pain , or abdominal swelling? No. Pain Score  0 *  Have you tolerated food without any problems? Yes.    Have you been able to return to your normal activities? Yes.    Do you have any questions about your discharge instructions: Diet   No. Medications  No. Follow up visit  No.  Do you have questions or concerns about your Care? No.  Actions: * If pain score is 4 or above: No action needed, pain <4.  1. Have you developed a fever since your procedure? no  2.   Have you had an respiratory symptoms (SOB or cough) since your procedure? no  3.   Have you tested positive for COVID 19 since your procedure no  4.   Have you had any family members/close contacts diagnosed with the COVID 19 since your procedure?  no   If yes to any of these questions please route to Joylene John, RN and Joella Prince, RN

## 2019-10-04 ENCOUNTER — Other Ambulatory Visit: Payer: Self-pay | Admitting: Family

## 2019-10-04 DIAGNOSIS — I10 Essential (primary) hypertension: Secondary | ICD-10-CM

## 2019-10-18 ENCOUNTER — Other Ambulatory Visit: Payer: Self-pay

## 2019-10-18 ENCOUNTER — Encounter (HOSPITAL_COMMUNITY): Payer: Self-pay | Admitting: Physical Therapy

## 2019-10-18 ENCOUNTER — Ambulatory Visit (HOSPITAL_COMMUNITY): Payer: 59 | Attending: Orthopedic Surgery | Admitting: Physical Therapy

## 2019-10-18 DIAGNOSIS — M6281 Muscle weakness (generalized): Secondary | ICD-10-CM

## 2019-10-18 DIAGNOSIS — R2689 Other abnormalities of gait and mobility: Secondary | ICD-10-CM | POA: Insufficient documentation

## 2019-10-18 DIAGNOSIS — M25561 Pain in right knee: Secondary | ICD-10-CM | POA: Diagnosis present

## 2019-10-18 DIAGNOSIS — G8929 Other chronic pain: Secondary | ICD-10-CM | POA: Insufficient documentation

## 2019-10-18 DIAGNOSIS — M545 Low back pain, unspecified: Secondary | ICD-10-CM

## 2019-10-18 DIAGNOSIS — R29898 Other symptoms and signs involving the musculoskeletal system: Secondary | ICD-10-CM | POA: Diagnosis present

## 2019-10-18 DIAGNOSIS — R293 Abnormal posture: Secondary | ICD-10-CM | POA: Diagnosis present

## 2019-10-18 NOTE — Therapy (Signed)
[facility] [place], [place], [address] Phone: [phone]   Fax:  [phone]  Physical Therapy Evaluation  Patient Details  Name: Isabel Garcia MRN: [id number] Date of Birth: 1955-05-31 Referring Provider (PT): Paralee Cancel MD   Encounter Date: 10/18/2019   PT End of Session - 10/18/19 1044    Visit Number 1    Number of Visits 8    Date for PT Re-Evaluation 11/15/19    Authorization Type [employer]; no auth required [id number]    Authorization - Visit Number 22    Authorization - Number of Visits 30    PT Start Time 0905    PT Stop Time 0943    PT Time Calculation (min) 38 min    Activity Tolerance Patient tolerated treatment well    Behavior During Therapy [facility] for tasks assessed/performed           Past Medical History:  Diagnosis Date  . Achilles tendinitis   . Arthritis   . Asthma   . Clotting disorder ([place])   . Diverticulitis   . Diverticulosis   . GERD (gastroesophageal reflux disease)   . Heart murmur    as a child  . History of blood clots   . History of chicken pox   . History of pulmonary embolus (PE) 2018  . Hyperlipidemia   . Hypertension   . Liver cirrhosis secondary to NASH (nonalcoholic steatohepatitis) ([place])   . Osteoarthritis   . Phlebitis   . Plantar fasciitis   . Sleep apnea    not wearing CPAP at this time  . Vertigo     Past Surgical History:  Procedure Laterality Date  . ABDOMINAL HYSTERECTOMY    . bone spur removal    . COLONOSCOPY    . KNEE ARTHROSCOPY Right   . KNEE ARTHROSCOPY Right   . 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   Procedure: TOTAL KNEE ARTHROPLASTY;  Surgeon: Paralee Cancel, MD;  Location: [facility];  Service: Orthopedics;  Laterality: Right;  70 mins    There were no vitals filed for this visit.    Subjective Assessment - 10/18/19 0912    Subjective Patient is a 64 y.o. female who presents to physical therapy with c/o chronic low back pain. Patient states she  fell about a month ago and her R knee still hurts. Patient states her back was doing good until she fell and that also made her back worse. She is also having some neck pain. Back pain is worse with standing and she is done after 30 minutes. She has core weakness. He is having trouble with household chores. Pain is decreased with SKTC and sitting. Patient states her main goal for physical therapy is to strengthen her back for standing.    Pertinent History hx of B DVT    Limitations Standing;Walking;Sitting    How long can you stand comfortably? 30 minutes    Patient Stated Goals strengthen her back for standing    Currently in Pain? Yes    Pain Score 4     Pain Location Back              [facility] PT Assessment - 10/18/19 0001      Assessment   Medical Diagnosis Lumbar DDD    Referring Provider (PT) Paralee Cancel MD    Onset Date/Surgical Date 09/13/19    Next MD Visit none scheduled    Prior Therapy R TKA  Precautions   Precautions None      Restrictions   Weight Bearing Restrictions No      Balance Screen   Has the patient fallen in the past 6 months Yes    How many times? 1    Has the patient had a decrease in activity level because of a fear of falling?  No    Is the patient reluctant to leave their home because of a fear of falling?  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      Cognition   Overall Cognitive Status Within Functional Limits for tasks assessed      Observation/Other Assessments   Observations Ambulating with limited knee flexion extension ROM on R due to brace, no AD    Focus on Therapeutic Outcomes (FOTO)  55% limited      AROM   Overall AROM Comments slight low back pain with all AROM    AROM Assessment Site Lumbar;Hip     Right/Left Hip Right;Left    Lumbar Flexion 25% limited    Lumbar Extension 75% limited    Lumbar - Right Side Bend 75% limited    Lumbar - Left Side Bend 75% limited    Lumbar - Right Rotation 25% limited    Lumbar - Left Rotation 25% limited      Strength   Right Hip Flexion 4/5    Right Hip Extension 3+/5    Left Hip Flexion 4/5    Left Hip Extension 3+/5    Right Knee Flexion 4+/5    Right Knee Extension 4-/5    Left Knee Flexion 5/5    Left Knee Extension 5/5    Right Ankle Dorsiflexion 5/5    Left Ankle Dorsiflexion 5/5      Palpation   Spinal mobility grossly hypomobile lumbar and thoracic spine    Palpation comment TTP lower lumbar paraspinals, bilateral QL bilateral glutes      Transfers   Five time sit to stand comments  43. 18 seconds    Comments slow, labored, no UE use, relies on LLE      Ambulation/Gait   Ambulation/Gait Yes    Ambulation/Gait Assistance 6: Modified independent (Device/Increase time)    Ambulation Distance (Feet) 100 Feet    Assistive device None    Gait Pattern Decreased hip/knee flexion - right;Trunk flexed;Antalgic    Ambulation Surface Level;Indoor                      Objective measurements completed on examination: See above findings.               PT Education - 10/18/19 0913    Education Details Patient educated on exam findings, POC, scope of PT, d/c use of knee brace by weaning, beginning knee exercises again    Person(s) Educated Patient    Methods Explanation;Demonstration    Comprehension Verbalized understanding;Returned demonstration            PT Short Term Goals - 10/18/19 1054      PT SHORT TERM GOAL #1   Title Patient will be independent with HEP in order to improve functional outcomes.    Time 2    Period  Weeks    Status New    Target Date 11/01/19      PT SHORT TERM GOAL #2   Title Patient will report at least 25% improvement in overall symptoms and/or functional ability.     Time 2    Period Weeks    Status New    Target Date 11/01/19             PT Long Term Goals - 10/18/19 1055      PT LONG TERM GOAL #1   Title Patient will report at least 75% improvement in symptoms for improved quality of life.    Time 4    Period Weeks    Status New    Target Date 11/15/19      PT LONG TERM GOAL #2   Title Patient will improve FOTO score by at least 10 points in order to indicate improved tolerance to activity.    Time 4    Period Weeks    Status New    Target Date 11/15/19      PT LONG TERM GOAL #3   Title Patient will be able to complete 5x STS in under 11.4 seconds in order to reduce the risk of falls.    Time 4    Period Weeks    Status New    Target Date 11/15/19      PT LONG TERM GOAL #4   Title Patient will demonstrate at least 25% improvement in lumbar ROM in all planes for improved ability to move trunk while completing household chores.    Time 4    Period Weeks    Status New    Target Date 11/15/19                  Plan - 10/18/19 1048    Clinical Impression Statement Patient is a 64 y.o. female who presents to physical therapy with c/o chronic low back pain which has increased since a fall about 1 month ago. She presents with pain limited deficits in Lumbar strength, ROM, endurance, postural impairments, spinal mobility, gait, and functional mobility with ADL. Patient gait pattern is likely contributing to symptoms and patient is educated on weaning from brace and beginning to complete previously performed exercises from R TKA rehab. She is having to modify and restrict ADL as indicated by FOTO score as well as subjective information and objective measures which is affecting overall participation. Patient will benefit from skilled physical therapy in order to improve function and reduce impairment.    Personal Factors and Comorbidities Time since onset of injury/illness/exacerbation;Comorbidity 2;Fitness    Comorbidities Hx of DVT,  HTN    Examination-Activity Limitations Stand;Locomotion Level;Transfers;Squat;Stairs;Bed Mobility;Bathing;Sit;Caring for Others;Lift;Hygiene/Grooming;Dressing;Toileting    Examination-Participation Restrictions Driving;Community Activity;Cleaning;Shop;Meal Prep;Laundry    Stability/Clinical Decision Making Stable/Uncomplicated    Clinical Decision Making Low    Rehab Potential Good    PT Frequency 2x / week    PT Duration 4 weeks    PT Treatment/Interventions ADLs/Self Care Home Management;Aquatic Therapy;Cryotherapy;Electrical Stimulation;Moist Heat;DME Instruction;Gait training;Stair training;Functional mobility training;Therapeutic activities;Therapeutic exercise;Balance training;Neuromuscular re-education;Patient/family education;Orthotic Fit/Training;Manual techniques;Passive range of motion;Dry needling;Energy conservation;Splinting;Taping;Scar mobilization    PT Next Visit Plan f/u on return to prior HEP, begin core and hip strengthening exercise, lumbar mobility exercises, possibly manual for pain/mobility    PT Home Exercise Plan 9/14 begin doing R TKA HEP    Consulted and Agree with Plan of Care Patient           Patient will  benefit from skilled therapeutic intervention in order to improve the following deficits and impairments:  Abnormal gait, Increased fascial restricitons, Improper body mechanics, Pain, Decreased mobility, Decreased activity tolerance, Decreased endurance, Decreased range of motion, Decreased strength, Hypomobility, Difficulty walking, Decreased knowledge of precautions, Decreased knowledge of use of DME, Decreased scar mobility, Decreased balance  Visit Diagnosis: Low back pain, unspecified back pain laterality, unspecified chronicity, unspecified whether sciatica present  Muscle weakness (generalized)  Other abnormalities of gait and mobility  Other symptoms and signs involving the musculoskeletal system  Chronic pain of right knee  Abnormal  posture     Problem List Patient Active Problem List   Diagnosis Date Noted  . S/P right TKA 06/07/2019  . History of DVT (deep vein thrombosis) 04/26/2018  . Current use of long term anticoagulation 04/26/2018  . 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 Chronic pain of right knee 10/22/2017  . Right foot pain 10/22/2017  . Neck pain 10/22/2017  . Obesity (BMI 35.0-39.9 without comorbidity) 10/22/2017  . Healthcare maintenance 10/20/2017  . History of pulmonary embolus (PE) 10/20/2017  . GERD (gastroesophageal reflux disease) 09/08/2017  . Cough 08/11/2017  . Essential hypertension 08/11/2017  . Obstructive sleep apnea 08/11/2017   10:58 AM, 10/18/19 Mearl Latin PT, DPT Physical Therapist at [facility] [place], [place], [address] Phone: [phone]   Fax:  [phone]  Name: Isabel Garcia MRN: [id number] Date of Birth: [birth year]

## 2019-10-20 ENCOUNTER — Other Ambulatory Visit: Payer: Self-pay

## 2019-10-20 ENCOUNTER — Encounter (HOSPITAL_COMMUNITY): Payer: Self-pay

## 2019-10-20 ENCOUNTER — Ambulatory Visit (HOSPITAL_COMMUNITY): Payer: 59

## 2019-10-20 DIAGNOSIS — M6281 Muscle weakness (generalized): Secondary | ICD-10-CM

## 2019-10-20 DIAGNOSIS — R29898 Other symptoms and signs involving the musculoskeletal system: Secondary | ICD-10-CM

## 2019-10-20 DIAGNOSIS — R2689 Other abnormalities of gait and mobility: Secondary | ICD-10-CM

## 2019-10-20 DIAGNOSIS — G8929 Other chronic pain: Secondary | ICD-10-CM

## 2019-10-20 DIAGNOSIS — M545 Low back pain, unspecified: Secondary | ICD-10-CM

## 2019-10-20 DIAGNOSIS — R293 Abnormal posture: Secondary | ICD-10-CM

## 2019-10-20 NOTE — Therapy (Signed)
[place], [place], [address] Phone: [phone]   Fax:  [phone]  Physical Therapy Treatment  Patient Details  Name: Isabel Garcia MRN: [id number] Date of Birth: 1955-03-26 Referring Provider (PT): Paralee Cancel MD   Encounter Date: 10/20/2019   PT End of Session - 10/20/19 1448    Visit Number 2    Number of Visits 8    Date for PT Re-Evaluation 11/15/19    Authorization Type [employer]; no auth required [id number]    Authorization - Visit Number 23    Authorization - Number of Visits 30    Progress Note Due on Visit 19    PT Start Time 1443    PT Stop Time 1530    PT Time Calculation (min) 47 min    Activity Tolerance Patient tolerated treatment well    Behavior During Therapy [facility] for tasks assessed/performed           Past Medical History:  Diagnosis Date  . Achilles tendinitis   . Arthritis   . Asthma   . Clotting disorder ([place])   . Diverticulitis   . Diverticulosis   . GERD (gastroesophageal reflux disease)   . Heart murmur    as a child  . History of blood clots   . History of chicken pox   . History of pulmonary embolus (PE) 2018  . Hyperlipidemia   . Hypertension   . Liver cirrhosis secondary to NASH (nonalcoholic steatohepatitis) ([place])   . Osteoarthritis   . Phlebitis   . Plantar fasciitis   . Sleep apnea    not wearing CPAP at this time  . Vertigo     Past Surgical History:  Procedure Laterality Date  . ABDOMINAL HYSTERECTOMY    . bone spur removal    . COLONOSCOPY    . KNEE ARTHROSCOPY Right   . KNEE ARTHROSCOPY Right   . 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   Procedure: TOTAL KNEE ARTHROPLASTY;  Surgeon: Paralee Cancel, MD;  Location: [facility];  Service: Orthopedics;  Laterality: Right;  70 mins    There were no vitals filed for this visit.   Subjective Assessment - 10/20/19 1446    Subjective Pt reports Lt back/hip and Rt knee pain scale 6/10.  Pt arrived wihtout cane and  brace on knee.    Pertinent History hx of B DVT    Patient Stated Goals strengthen her back for standing    Currently in Pain? Yes    Pain Score 6     Pain Location Back    Pain Orientation Left;Lower    Pain Descriptors / Indicators Sharp;Aching;Sore;Tightness   sharp pain medial aspect of knee, back tight, sore achey   Pain Type Chronic pain    Pain Onset More than a month ago    Pain Frequency Intermittent    Aggravating Factors  activity    Pain Relieving Factors rest and ice    Effect of Pain on Daily Activities severly limits                             [facility] Adult PT Treatment/Exercise - 10/20/19 0001      Knee/Hip Exercises: Stretches   Active Hamstring Stretch Right;3 reps;30 seconds    Active Hamstring Stretch Limitations 12'' step    Other Knee/Hip Stretches LTR 5x 10"; SKTC 2x 30"      Knee/Hip Exercises: Standing  Gait Training Reviewed proper mechanics for heel to toe      Knee/Hip Exercises: Supine   Quad Sets 10 reps    Short Arc Quad Sets Right;15 reps    Heel Slides 10 reps    Bridges Both;10 reps    Bridges Limitations 5-10" holds      Manual Therapy   Manual Therapy Edema management    Manual therapy comments Manual complete separate than rest of tx    Edema Management Decongitive techqniues for edema control Rt LE with LE elevated                  PT Education - 10/20/19 1509    Education Details Reviewed goals, assured complaince wiht HEP for TKR with additional exercises for LBP.    Person(s) Educated Patient    Methods Explanation    Comprehension Verbalized understanding;Returned demonstration            PT Short Term Goals - 10/18/19 1054      PT SHORT TERM GOAL #1   Title Patient will be independent with HEP in order to improve functional outcomes.    Time 2    Period Weeks    Status New    Target Date 11/01/19      PT SHORT TERM GOAL #2   Title Patient will report at least 25% improvement in overall  symptoms and/or functional ability.    Time 2    Period Weeks    Status New    Target Date 11/01/19             PT Long Term Goals - 10/18/19 1055      PT LONG TERM GOAL #1   Title Patient will report at least 75% improvement in symptoms for improved quality of life.    Time 4    Period Weeks    Status New    Target Date 11/15/19      PT LONG TERM GOAL #2   Title Patient will improve FOTO score by at least 10 points in order to indicate improved tolerance to activity.    Time 4    Period Weeks    Status New    Target Date 11/15/19      PT LONG TERM GOAL #3   Title Patient will be able to complete 5x STS in under 11.4 seconds in order to reduce the risk of falls.    Time 4    Period Weeks    Status New    Target Date 11/15/19      PT LONG TERM GOAL #4   Title Patient will demonstrate at least 25% improvement in lumbar ROM in all planes for improved ability to move trunk while completing household chores.    Time 4    Period Weeks    Status New    Target Date 11/15/19                 Plan - 10/20/19 1512    Clinical Impression Statement Reviewed goals, assured compliance with HEP focusing on TKR, additional stretches/strengthening exercises added to address lumbar mobility and strengthening.  Pt able to complete all exercises with no reports of increased pain, does have some knee discomfort in posterior region that was resolved following exercise.  Manual decongitive techqniues complete for edema/pain control and reviewed proper gait mechanics EOS.    Personal Factors and Comorbidities Time since onset of injury/illness/exacerbation;Comorbidity 2;Fitness    Comorbidities Hx of DVT, HTN  Examination-Activity Limitations Stand;Locomotion Level;Transfers;Squat;Stairs;Bed Mobility;Bathing;Sit;Caring for Others;Lift;Hygiene/Grooming;Dressing;Toileting    Examination-Participation Restrictions Driving;Community Activity;Cleaning;Shop;Meal Prep;Laundry     Stability/Clinical Decision Making Stable/Uncomplicated    Clinical Decision Making Low    Rehab Potential Good    PT Frequency 2x / week    PT Duration 4 weeks    PT Treatment/Interventions ADLs/Self Care Home Management;Aquatic Therapy;Cryotherapy;Electrical Stimulation;Moist Heat;DME Instruction;Gait training;Stair training;Functional mobility training;Therapeutic activities;Therapeutic exercise;Balance training;Neuromuscular re-education;Patient/family education;Orthotic Fit/Training;Manual techniques;Passive range of motion;Dry needling;Energy conservation;Splinting;Taping;Scar mobilization    PT Next Visit Plan progress core and hip strengthening exercise, lumbar mobility exercises, possibly manual for pain/mobility    PT Home Exercise Plan 9/14 begin doing R TKA HEP; 9/16: LTR, SKTC, bridge           Patient will benefit from skilled therapeutic intervention in order to improve the following deficits and impairments:  Abnormal gait, Increased fascial restricitons, Improper body mechanics, Pain, Decreased mobility, Decreased activity tolerance, Decreased endurance, Decreased range of motion, Decreased strength, Hypomobility, Difficulty walking, Decreased knowledge of precautions, Decreased knowledge of use of DME, Decreased scar mobility, Decreased balance  Visit Diagnosis: Low back pain, unspecified back pain laterality, unspecified chronicity, unspecified whether sciatica present  Muscle weakness (generalized)  Other abnormalities of gait and mobility  Other symptoms and signs involving the musculoskeletal system  Chronic pain of right knee  Abnormal posture     Problem List Patient Active Problem List   Diagnosis Date Noted  . S/P right TKA 06/07/2019  . History of DVT (deep vein thrombosis) 04/26/2018  . Current use of long term anticoagulation 04/26/2018  . 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 Chronic pain of right knee 10/22/2017  . Right  foot pain 10/22/2017  . Neck pain 10/22/2017  . Obesity (BMI 35.0-39.9 without comorbidity) 10/22/2017  . Healthcare maintenance 10/20/2017  . History of pulmonary embolus (PE) 10/20/2017  . GERD (gastroesophageal reflux disease) 09/08/2017  . Cough 08/11/2017  . Essential hypertension 08/11/2017  . Obstructive sleep apnea 08/11/2017   Ihor Austin, LPTA/CLT; CBIS [phone]  Aldona Lento 10/20/2019, 5:22 PM  [place], [place], [address] Phone: [phone]   Fax:  [phone]  Name: Isabel Garcia MRN: [id number] Date of Birth: 1955/07/31

## 2019-10-20 NOTE — Patient Instructions (Signed)
Lower Trunk Rotation Stretch    Keeping back flat and feet together, rotate knees to left side. Hold 10 seconds. Repeat 5 times per set. Do 2 sets per day.  http://orth.exer.us/123   Copyright  VHI. All rights reserved.   Knee to Chest (Flexion)    Pull knee toward chest. Feel stretch in lower back or buttock area. Breathing deeply, Hold 30 seconds. Repeat with other knee. Repeat 2 times.  http://gt2.exer.us/226   Copyright  VHI. All rights reserved.   Bridge    Lie back, legs bent. Inhale, pressing hips up. Keeping ribs in, lengthen lower back. Exhale, rolling down along spine from top. Repeat 10 times. Do 2 sessions per day.  http://pm.exer.us/55   Copyright  VHI. All rights reserved.

## 2019-10-24 ENCOUNTER — Other Ambulatory Visit: Payer: Self-pay

## 2019-10-24 ENCOUNTER — Ambulatory Visit (HOSPITAL_COMMUNITY): Payer: 59 | Admitting: Physical Therapy

## 2019-10-24 ENCOUNTER — Encounter (HOSPITAL_COMMUNITY): Payer: Self-pay | Admitting: Physical Therapy

## 2019-10-24 DIAGNOSIS — M545 Low back pain, unspecified: Secondary | ICD-10-CM

## 2019-10-24 DIAGNOSIS — R2689 Other abnormalities of gait and mobility: Secondary | ICD-10-CM

## 2019-10-24 DIAGNOSIS — G8929 Other chronic pain: Secondary | ICD-10-CM

## 2019-10-24 DIAGNOSIS — M6281 Muscle weakness (generalized): Secondary | ICD-10-CM

## 2019-10-24 DIAGNOSIS — R29898 Other symptoms and signs involving the musculoskeletal system: Secondary | ICD-10-CM

## 2019-10-24 DIAGNOSIS — R293 Abnormal posture: Secondary | ICD-10-CM

## 2019-10-24 NOTE — Patient Instructions (Signed)
Access Code: [email or web address] URL: [email or web address] Date: 10/24/2019 Prepared by: Mitzi Hansen Satya Buttram  Exercises Abdominal Bracing - 1 x daily - 7 x weekly - 5 reps - 5 second hold Hooklying Small March - 1 x daily - 7 x weekly - 2 sets - 10 reps Prone Hip Extension - 1 x daily - 7 x weekly - 3 sets - 5 reps Isometric Dead Bug - 1 x daily - 7 x weekly - 10 reps - 5 second hold

## 2019-10-24 NOTE — Therapy (Signed)
[place], [place], [address] Phone: [phone]   Fax:  [phone]  Physical Therapy Treatment  Patient Details  Name: Isabel Garcia MRN: [id number] Date of Birth: 1955-04-24 Referring Provider (PT): Paralee Cancel MD   Encounter Date: 10/24/2019   PT End of Session - 10/24/19 1449    Visit Number 3    Number of Visits 8    Date for PT Re-Evaluation 11/15/19    Authorization Type [employer]; no auth required [id number]    Authorization - Visit Number 24    Authorization - Number of Visits 30    Progress Note Due on Visit --    PT Start Time 1450    PT Stop Time 1530    PT Time Calculation (min) 40 min    Activity Tolerance Patient tolerated treatment well    Behavior During Therapy [facility] for tasks assessed/performed           Past Medical History:  Diagnosis Date  . Achilles tendinitis   . Arthritis   . Asthma   . Clotting disorder ([place])   . Diverticulitis   . Diverticulosis   . GERD (gastroesophageal reflux disease)   . Heart murmur    as a child  . History of blood clots   . History of chicken pox   . History of pulmonary embolus (PE) 2018  . Hyperlipidemia   . Hypertension   . Liver cirrhosis secondary to NASH (nonalcoholic steatohepatitis) ([place])   . Osteoarthritis   . Phlebitis   . Plantar fasciitis   . Sleep apnea    not wearing CPAP at this time  . Vertigo     Past Surgical History:  Procedure Laterality Date  . ABDOMINAL HYSTERECTOMY    . bone spur removal    . COLONOSCOPY    . KNEE ARTHROSCOPY Right   . KNEE ARTHROSCOPY Right   . 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   Procedure: TOTAL KNEE ARTHROPLASTY;  Surgeon: Paralee Cancel, MD;  Location: [facility];  Service: Orthopedics;  Laterality: Right;  70 mins    There were no vitals filed for this visit.   Subjective Assessment - 10/24/19 1449    Subjective Patient states she has been doing her exercises. She could barely move this  morning and then she felt better after doing her exercises.    Pertinent History hx of B DVT    Patient Stated Goals strengthen her back for standing    Currently in Pain? Yes    Pain Score 6     Pain Location Back    Pain Onset More than a month ago                             [facility] Adult PT Treatment/Exercise - 10/24/19 0001      Knee/Hip Exercises: Seated   Sit to Sand 2 sets;5 reps;without UE support      Knee/Hip Exercises: Supine   Bridges Both;10 reps;2 sets    Bridges Limitations with glute sets prior; glute sets 5x5 second holds    Other Supine Knee/Hip Exercises ab set 5 x 5 second holds; marches with ab set 2x10     Other Supine Knee/Hip Exercises dead bug isometric with green ball 5x5 second holds      Knee/Hip Exercises: Prone   Hip Extension Both;3 sets;5 reps  PT Education - 10/24/19 1449    Education Details Patient educated on HEP, exercise mechanics    Person(s) Educated Patient    Methods Explanation;Demonstration    Comprehension Verbalized understanding;Returned demonstration            PT Short Term Goals - 10/18/19 1054      PT SHORT TERM GOAL #1   Title Patient will be independent with HEP in order to improve functional outcomes.    Time 2    Period Weeks    Status New    Target Date 11/01/19      PT SHORT TERM GOAL #2   Title Patient will report at least 25% improvement in overall symptoms and/or functional ability.    Time 2    Period Weeks    Status New    Target Date 11/01/19             PT Long Term Goals - 10/18/19 1055      PT LONG TERM GOAL #1   Title Patient will report at least 75% improvement in symptoms for improved quality of life.    Time 4    Period Weeks    Status New    Target Date 11/15/19      PT LONG TERM GOAL #2   Title Patient will improve FOTO score by at least 10 points in order to indicate improved tolerance to activity.    Time 4    Period Weeks    Status  New    Target Date 11/15/19      PT LONG TERM GOAL #3   Title Patient will be able to complete 5x STS in under 11.4 seconds in order to reduce the risk of falls.    Time 4    Period Weeks    Status New    Target Date 11/15/19      PT LONG TERM GOAL #4   Title Patient will demonstrate at least 25% improvement in lumbar ROM in all planes for improved ability to move trunk while completing household chores.    Time 4    Period Weeks    Status New    Target Date 11/15/19                 Plan - 10/24/19 1450    Clinical Impression Statement Patient educated on beginning heel slides for improving knee flexion ROM. Patient able to achieve glute set and perform bridge without hamstring cramping with cueing for glute set prior to lift. Patient requires verbal cueing for ab set and has great difficulty maintaining ab set with marching but is able to complete with fatigue and frequent rest breaks. Patient has very weak glute musculature bilaterally and fatigues quickly with prone hip extension. She completes dead bug iso with ball with good mechanics and difficulty with STS secondary to impaired LE strength and c/o R knee pain. Patient will continue to benefit from skilled physical therapy in order to reduce impairment and improve function.    Personal Factors and Comorbidities Time since onset of injury/illness/exacerbation;Comorbidity 2;Fitness    Comorbidities Hx of DVT, HTN    Examination-Activity Limitations Stand;Locomotion Level;Transfers;Squat;Stairs;Bed Mobility;Bathing;Sit;Caring for Others;Lift;Hygiene/Grooming;Dressing;Toileting    Examination-Participation Restrictions Driving;Community Activity;Cleaning;Shop;Meal Prep;Laundry    Stability/Clinical Decision Making Stable/Uncomplicated    Rehab Potential Good    PT Frequency 2x / week    PT Duration 4 weeks    PT Treatment/Interventions ADLs/Self Care Home Management;Aquatic Therapy;Cryotherapy;Electrical Stimulation;Moist  Heat;DME Instruction;Gait training;Stair training;Functional mobility training;Therapeutic  activities;Therapeutic exercise;Balance training;Neuromuscular re-education;Patient/family education;Orthotic Fit/Training;Manual techniques;Passive range of motion;Dry needling;Energy conservation;Splinting;Taping;Scar mobilization    PT Next Visit Plan progress core and hip strengthening exercise, lumbar mobility exercises, possibly manual for pain/mobility    PT Home Exercise Plan 9/14 begin doing R TKA HEP; 9/16: LTR, SKTC, bridge 9/20 prone hip ext, ab set, marches, dead bug iso           Patient will benefit from skilled therapeutic intervention in order to improve the following deficits and impairments:  Abnormal gait, Increased fascial restricitons, Improper body mechanics, Pain, Decreased mobility, Decreased activity tolerance, Decreased endurance, Decreased range of motion, Decreased strength, Hypomobility, Difficulty walking, Decreased knowledge of precautions, Decreased knowledge of use of DME, Decreased scar mobility, Decreased balance  Visit Diagnosis: Low back pain, unspecified back pain laterality, unspecified chronicity, unspecified whether sciatica present  Muscle weakness (generalized)  Other abnormalities of gait and mobility  Other symptoms and signs involving the musculoskeletal system  Chronic pain of right knee  Abnormal posture     Problem List Patient Active Problem List   Diagnosis Date Noted  . S/P right TKA 06/07/2019  . History of DVT (deep vein thrombosis) 04/26/2018  . Current use of long term anticoagulation 04/26/2018  . 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 Chronic pain of right knee 10/22/2017  . Right foot pain 10/22/2017  . Neck pain 10/22/2017  . Obesity (BMI 35.0-39.9 without comorbidity) 10/22/2017  . Healthcare maintenance 10/20/2017  . History of pulmonary embolus (PE) 10/20/2017  . GERD (gastroesophageal reflux disease)  09/08/2017  . Cough 08/11/2017  . Essential hypertension 08/11/2017  . Obstructive sleep apnea 08/11/2017    3:37 PM, 10/24/19 Mearl Latin PT, DPT Physical Therapist at [place], [place], [address] Phone: [phone]   Fax:  [phone]  Name: Isabel Garcia MRN: [id number] Date of Birth: 1955-02-27

## 2019-10-27 ENCOUNTER — Telehealth (HOSPITAL_COMMUNITY): Payer: Self-pay | Admitting: Physical Therapy

## 2019-10-27 ENCOUNTER — Ambulatory Visit (HOSPITAL_COMMUNITY): Payer: 59 | Admitting: Physical Therapy

## 2019-10-27 NOTE — Telephone Encounter (Signed)
pt called to cx this appt due to she is running late from another appt.

## 2019-10-28 ENCOUNTER — Ambulatory Visit (HOSPITAL_COMMUNITY): Payer: 59 | Admitting: Physical Therapy

## 2019-10-28 ENCOUNTER — Other Ambulatory Visit: Payer: Self-pay

## 2019-10-28 DIAGNOSIS — M545 Low back pain, unspecified: Secondary | ICD-10-CM

## 2019-10-28 DIAGNOSIS — R2689 Other abnormalities of gait and mobility: Secondary | ICD-10-CM

## 2019-10-28 DIAGNOSIS — M6281 Muscle weakness (generalized): Secondary | ICD-10-CM

## 2019-10-28 NOTE — Therapy (Signed)
[place], [place], [address] Phone: [phone]   Fax:  [phone]  Physical Therapy Treatment  Patient Details  Name: Isabel Garcia MRN: [id number] Date of Birth: 22-Nov-1955 Referring Provider (PT): Paralee Cancel MD   Encounter Date: 10/28/2019   PT End of Session - 10/28/19 1415    Visit Number 4    Number of Visits 8    Date for PT Re-Evaluation 11/15/19    Authorization Type [employer]; no auth required [id number]    Authorization - Visit Number 25    Authorization - Number of Visits 30    PT Start Time 5859    PT Stop Time 1355    PT Time Calculation (min) 40 min    Activity Tolerance Patient tolerated treatment well    Behavior During Therapy [facility] for tasks assessed/performed           Past Medical History:  Diagnosis Date  . Achilles tendinitis   . Arthritis   . Asthma   . Clotting disorder ([place])   . Diverticulitis   . Diverticulosis   . GERD (gastroesophageal reflux disease)   . Heart murmur    as a child  . History of blood clots   . History of chicken pox   . History of pulmonary embolus (PE) 2018  . Hyperlipidemia   . Hypertension   . Liver cirrhosis secondary to NASH (nonalcoholic steatohepatitis) ([place])   . Osteoarthritis   . Phlebitis   . Plantar fasciitis   . Sleep apnea    not wearing CPAP at this time  . Vertigo     Past Surgical History:  Procedure Laterality Date  . ABDOMINAL HYSTERECTOMY    . bone spur removal    . COLONOSCOPY    . KNEE ARTHROSCOPY Right   . KNEE ARTHROSCOPY Right   . 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   Procedure: TOTAL KNEE ARTHROPLASTY;  Surgeon: Paralee Cancel, MD;  Location: [facility];  Service: Orthopedics;  Laterality: Right;  70 mins    There were no vitals filed for this visit.   Subjective Assessment - 10/28/19 1401    Subjective PT states that she was trying to clean the shower while she was in it and her back tightened up on her.     Pertinent History hx of B DVT    Limitations Standing;Walking;Sitting    How long can you sit comfortably? 30 minutes because feet start swelling    How long can you stand comfortably? 30 minutes    How long can you walk comfortably? 5 minutes    Patient Stated Goals strengthen her back for standing    Currently in Pain? Yes    Pain Score 5     Pain Location Back    Pain Orientation Left    Pain Descriptors / Indicators Aching    Pain Type Chronic pain    Pain Onset More than a month ago    Pain Frequency Intermittent    Aggravating Factors  activity    Pain Relieving Factors rest and ice    Effect of Pain on Daily Activities limits                             [facility] Adult PT Treatment/Exercise - 10/28/19 0001      Exercises   Exercises Lumbar      Lumbar Exercises: Stretches   Active Hamstring Stretch  Right;Left;3 reps;30 seconds    Single Knee to Chest Stretch Right;Left;5 reps    Lower Trunk Rotation 5 reps    Prone on Elbows Stretch 1 rep;60 seconds    Quad Stretch Right;Left;3 reps;30 seconds    Other Lumbar Stretch Exercise lumbar excursion x 3       Lumbar Exercises: Standing   Heel Raises 10 reps    Functional Squats 10 reps      Lumbar Exercises: Supine   Bent Knee Raise 10 reps    Bridge 10 reps    Straight Leg Raise 10 reps    Other Supine Lumbar Exercises toe tap x 10 B       Lumbar Exercises: Prone   Straight Leg Raise 10 reps    Other Prone Lumbar Exercises glut and ab set together x 10                     PT Short Term Goals - 10/18/19 1054      PT SHORT TERM GOAL #1   Title Patient will be independent with HEP in order to improve functional outcomes.    Time 2    Period Weeks    Status New    Target Date 11/01/19      PT SHORT TERM GOAL #2   Title Patient will report at least 25% improvement in overall symptoms and/or functional ability.    Time 2    Period Weeks    Status New    Target Date 11/01/19              PT Long Term Goals - 10/18/19 1055      PT LONG TERM GOAL #1   Title Patient will report at least 75% improvement in symptoms for improved quality of life.    Time 4    Period Weeks    Status New    Target Date 11/15/19      PT LONG TERM GOAL #2   Title Patient will improve FOTO score by at least 10 points in order to indicate improved tolerance to activity.    Time 4    Period Weeks    Status New    Target Date 11/15/19      PT LONG TERM GOAL #3   Title Patient will be able to complete 5x STS in under 11.4 seconds in order to reduce the risk of falls.    Time 4    Period Weeks    Status New    Target Date 11/15/19      PT LONG TERM GOAL #4   Title Patient will demonstrate at least 25% improvement in lumbar ROM in all planes for improved ability to move trunk while completing household chores.    Time 4    Period Weeks    Status New    Target Date 11/15/19                 Plan - 10/28/19 1400    Clinical Impression Statement Added stretching of both lumbar and Rt knee to pr program with HEP given to allow pt to continue this at home.  Prone SLR to improve gliuteal mm strength.  Standing exercises to improve functional stability.    Personal Factors and Comorbidities Time since onset of injury/illness/exacerbation;Comorbidity 2;Fitness    Comorbidities Hx of DVT, HTN    Examination-Activity Limitations Stand;Locomotion Level;Transfers;Squat;Stairs;Bed Mobility;Bathing;Sit;Caring for Others;Lift;Hygiene/Grooming;Dressing;Toileting    Examination-Participation Restrictions Driving;Community Activity;Cleaning;Shop;Meal Prep;Laundry    Stability/Clinical  Decision Making Stable/Uncomplicated    Rehab Potential Good    PT Frequency 2x / week    PT Duration 4 weeks    PT Treatment/Interventions ADLs/Self Care Home Management;Aquatic Therapy;Cryotherapy;Electrical Stimulation;Moist Heat;DME Instruction;Gait training;Stair training;Functional mobility  training;Therapeutic activities;Therapeutic exercise;Balance training;Neuromuscular re-education;Patient/family education;Orthotic Fit/Training;Manual techniques;Passive range of motion;Dry needling;Energy conservation;Splinting;Taping;Scar mobilization    PT Next Visit Plan progress core and hip strengthening exercise, lumbar mobility exercises, possibly manual for pain/mobility    PT Home Exercise Plan 9/14 begin doing R TKA HEP; 9/16: LTR, SKTC, bridge 9/20 prone hip ext, ab set, marches, dead bug iso;9/24:  hip excursions  quad stretch  toe tap ,heel raises squats           Patient will benefit from skilled therapeutic intervention in order to improve the following deficits and impairments:  Abnormal gait, Increased fascial restricitons, Improper body mechanics, Pain, Decreased mobility, Decreased activity tolerance, Decreased endurance, Decreased range of motion, Decreased strength, Hypomobility, Difficulty walking, Decreased knowledge of precautions, Decreased knowledge of use of DME, Decreased scar mobility, Decreased balance  Visit Diagnosis: Low back pain, unspecified back pain laterality, unspecified chronicity, unspecified whether sciatica present  Muscle weakness (generalized)  Other abnormalities of gait and mobility     Problem List Patient Active Problem List   Diagnosis Date Noted  . S/P right TKA 06/07/2019  . History of DVT (deep vein thrombosis) 04/26/2018  . Current use of long term anticoagulation 04/26/2018  . 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 Chronic pain of right knee 10/22/2017  . Right foot pain 10/22/2017  . Neck pain 10/22/2017  . Obesity (BMI 35.0-39.9 without comorbidity) 10/22/2017  . Healthcare maintenance 10/20/2017  . History of pulmonary embolus (PE) 10/20/2017  . GERD (gastroesophageal reflux disease) 09/08/2017  . Cough 08/11/2017  . Essential hypertension 08/11/2017  . Obstructive sleep apnea 08/11/2017     Rayetta Humphrey, PT CLT [phone] 10/28/2019, 2:38 PM  [facility] [address] [place], [place], [address] Phone: [phone]   Fax:  [phone]  Name: Isabel Garcia MRN: [id number] Date of Birth: Jul 14, 1955

## 2019-11-02 ENCOUNTER — Other Ambulatory Visit: Payer: Self-pay

## 2019-11-02 ENCOUNTER — Ambulatory Visit (HOSPITAL_COMMUNITY): Payer: 59 | Admitting: Physical Therapy

## 2019-11-02 ENCOUNTER — Encounter (HOSPITAL_COMMUNITY): Payer: Self-pay | Admitting: Physical Therapy

## 2019-11-02 DIAGNOSIS — M545 Low back pain, unspecified: Secondary | ICD-10-CM

## 2019-11-02 DIAGNOSIS — R29898 Other symptoms and signs involving the musculoskeletal system: Secondary | ICD-10-CM

## 2019-11-02 DIAGNOSIS — R2689 Other abnormalities of gait and mobility: Secondary | ICD-10-CM

## 2019-11-02 DIAGNOSIS — M6281 Muscle weakness (generalized): Secondary | ICD-10-CM

## 2019-11-02 NOTE — Therapy (Signed)
[place], [place], [address] Phone: [phone]   Fax:  [phone]  Physical Therapy Treatment  Patient Details  Name: Isabel Garcia MRN: [id number] Date of Birth: March 02, 1955 Referring Provider (PT): Paralee Cancel MD   Encounter Date: 11/02/2019   PT End of Session - 11/02/19 1553    Visit Number 5    Number of Visits 8    Date for PT Re-Evaluation 11/15/19    Authorization Type [employer]; no auth required [id number]    Authorization - Visit Number 26    Authorization - Number of Visits 30    PT Start Time 3335    PT Stop Time 1620    PT Time Calculation (min) 42 min    Activity Tolerance Patient tolerated treatment well    Behavior During Therapy [facility] for tasks assessed/performed           Past Medical History:  Diagnosis Date  . Achilles tendinitis   . Arthritis   . Asthma   . Clotting disorder ([place])   . Diverticulitis   . Diverticulosis   . GERD (gastroesophageal reflux disease)   . Heart murmur    as a child  . History of blood clots   . History of chicken pox   . History of pulmonary embolus (PE) 2018  . Hyperlipidemia   . Hypertension   . Liver cirrhosis secondary to NASH (nonalcoholic steatohepatitis) ([place])   . Osteoarthritis   . Phlebitis   . Plantar fasciitis   . Sleep apnea    not wearing CPAP at this time  . Vertigo     Past Surgical History:  Procedure Laterality Date  . ABDOMINAL HYSTERECTOMY    . bone spur removal    . COLONOSCOPY    . KNEE ARTHROSCOPY Right   . KNEE ARTHROSCOPY Right   . 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   Procedure: TOTAL KNEE ARTHROPLASTY;  Surgeon: Paralee Cancel, MD;  Location: [facility];  Service: Orthopedics;  Laterality: Right;  70 mins    There were no vitals filed for this visit.   Subjective Assessment - 11/02/19 1539    Subjective Pt states that she still has pain on her LT side that goes down into her buttocks.    Pertinent History hx of B  DVT    Limitations Standing;Walking;Sitting    How long can you sit comfortably? 30 minutes because feet start swelling    How long can you stand comfortably? 30 minutes    How long can you walk comfortably? 5 minutes    Patient Stated Goals strengthen her back for standing    Currently in Pain? Yes    Pain Score 7     Pain Location Back    Pain Orientation Left    Pain Descriptors / Indicators Throbbing    Pain Type Chronic pain    Pain Onset More than a month ago    Pain Frequency Intermittent    Aggravating Factors  activity    Pain Relieving Factors rest                             [facility] Adult PT Treatment/Exercise - 11/02/19 0001      Exercises   Exercises Lumbar      Lumbar Exercises: Stretches   Active Hamstring Stretch Right;Left;3 reps;30 seconds    Single Knee to Chest Stretch Right;Left;5 reps    Lower  Trunk Rotation 5 reps    Prone on Elbows Stretch 1 rep;60 seconds    Press Ups 5 reps    Quad Stretch Right;Left;3 reps;30 seconds    Other Lumbar Stretch Exercise wall arch x 10       Lumbar Exercises: Standing   Heel Raises --   with wall arch    Functional Squats 10 reps    Other Standing Lumbar Exercises standing hip hike x 10       Lumbar Exercises: Seated   Sit to Stand 10 reps      Lumbar Exercises: Supine   Bent Knee Raise 10 reps    Bridge 10 reps    Other Supine Lumbar Exercises toe tap x 10 B       Lumbar Exercises: Prone   Straight Leg Raise 10 reps    Other Prone Lumbar Exercises glut and ab set together x 10                     PT Short Term Goals - 11/02/19 1602      PT SHORT TERM GOAL #1   Title Patient will be independent with HEP in order to improve functional outcomes.    Time 2    Period Weeks    Status Achieved    Target Date 11/01/19      PT SHORT TERM GOAL #2   Title Patient will report at least 25% improvement in overall symptoms and/or functional ability.    Time 2    Period Weeks    Status  On-going    Target Date 11/01/19      PT SHORT TERM GOAL #3   Status On-going             PT Long Term Goals - 11/02/19 1602      PT LONG TERM GOAL #1   Title Patient will report at least 75% improvement in symptoms for improved quality of life.    Time 4    Period Weeks    Status On-going      PT LONG TERM GOAL #2   Title Patient will improve FOTO score by at least 10 points in order to indicate improved tolerance to activity.    Time 4    Period Weeks    Status On-going      PT LONG TERM GOAL #3   Title Patient will be able to complete 5x STS in under 11.4 seconds in order to reduce the risk of falls.    Time 4    Period Weeks    Status On-going      PT LONG TERM GOAL #4   Title Patient will demonstrate at least 25% improvement in lumbar ROM in all planes for improved ability to move trunk while completing household chores.    Time 4    Period Weeks    Status On-going      PT LONG TERM GOAL #5   Status On-going                 Plan - 11/02/19 1554    Clinical Impression Statement Pt continues to have incresed Lt back/buttock pain  SI assessed with unremarkable findings.  Continue with lumbar stretching and stabilization.    Personal Factors and Comorbidities Time since onset of injury/illness/exacerbation;Comorbidity 2;Fitness    Comorbidities Hx of DVT, HTN    Examination-Activity Limitations Stand;Locomotion Level;Transfers;Squat;Stairs;Bed Mobility;Bathing;Sit;Caring for Others;Lift;Hygiene/Grooming;Dressing;Toileting    Examination-Participation Restrictions Driving;Community Activity;Cleaning;Shop;Meal Prep;Laundry  Stability/Clinical Decision Making Stable/Uncomplicated    Clinical Decision Making Low    Rehab Potential Good    PT Frequency 2x / week    PT Duration 4 weeks    PT Treatment/Interventions ADLs/Self Care Home Management;Aquatic Therapy;Cryotherapy;Electrical Stimulation;Moist Heat;DME Instruction;Gait training;Stair  training;Functional mobility training;Therapeutic activities;Therapeutic exercise;Balance training;Neuromuscular re-education;Patient/family education;Orthotic Fit/Training;Manual techniques;Passive range of motion;Dry needling;Energy conservation;Splinting;Taping;Scar mobilization    PT Next Visit Plan progress core and hip strengthening exercise, lumbar mobility exercises, possibly manual for pain/mobility    PT Home Exercise Plan 9/14 begin doing R TKA HEP; 9/16: LTR, SKTC, bridge 9/20 prone hip ext, ab set, marches, dead bug iso;9/24:  hip excursions  quad stretch  toe tap ,heel raises squats    Consulted and Agree with Plan of Care Patient           Patient will benefit from skilled therapeutic intervention in order to improve the following deficits and impairments:  Abnormal gait, Increased fascial restricitons, Improper body mechanics, Pain, Decreased mobility, Decreased activity tolerance, Decreased endurance, Decreased range of motion, Decreased strength, Hypomobility, Difficulty walking, Decreased knowledge of precautions, Decreased knowledge of use of DME, Decreased scar mobility, Decreased balance  Visit Diagnosis: Low back pain, unspecified back pain laterality, unspecified chronicity, unspecified whether sciatica present  Muscle weakness (generalized)  Other abnormalities of gait and mobility  Other symptoms and signs involving the musculoskeletal system     Problem List Patient Active Problem List   Diagnosis Date Noted  . S/P right TKA 06/07/2019  . History of DVT (deep vein thrombosis) 04/26/2018  . Current use of long term anticoagulation 04/26/2018  . 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 Chronic pain of right knee 10/22/2017  . Right foot pain 10/22/2017  . Neck pain 10/22/2017  . Obesity (BMI 35.0-39.9 without comorbidity) 10/22/2017  . Healthcare maintenance 10/20/2017  . History of pulmonary embolus (PE) 10/20/2017  . GERD  (gastroesophageal reflux disease) 09/08/2017  . Cough 08/11/2017  . Essential hypertension 08/11/2017  . Obstructive sleep apnea 08/11/2017    Rayetta Humphrey, PT CLT [phone] 11/02/2019, 4:21 PM  [facility] [address] [place], [place], [address] Phone: [phone]   Fax:  [phone]  Name: Isabel Garcia MRN: [id number] Date of Birth: 1955/09/26

## 2019-11-04 ENCOUNTER — Other Ambulatory Visit: Payer: Self-pay

## 2019-11-04 ENCOUNTER — Ambulatory Visit (HOSPITAL_COMMUNITY): Payer: 59 | Attending: Orthopedic Surgery

## 2019-11-04 ENCOUNTER — Encounter (HOSPITAL_COMMUNITY): Payer: Self-pay

## 2019-11-04 DIAGNOSIS — M545 Low back pain, unspecified: Secondary | ICD-10-CM | POA: Diagnosis not present

## 2019-11-04 DIAGNOSIS — M25561 Pain in right knee: Secondary | ICD-10-CM | POA: Diagnosis present

## 2019-11-04 DIAGNOSIS — R29898 Other symptoms and signs involving the musculoskeletal system: Secondary | ICD-10-CM | POA: Diagnosis present

## 2019-11-04 DIAGNOSIS — G8929 Other chronic pain: Secondary | ICD-10-CM | POA: Insufficient documentation

## 2019-11-04 DIAGNOSIS — M6281 Muscle weakness (generalized): Secondary | ICD-10-CM | POA: Diagnosis present

## 2019-11-04 DIAGNOSIS — R2689 Other abnormalities of gait and mobility: Secondary | ICD-10-CM | POA: Diagnosis present

## 2019-11-04 NOTE — Therapy (Signed)
Isabel Garcia, [place], [address] Phone: [phone]   Fax:  [phone]  Physical Therapy Treatment  Patient Details  Name: Isabel Garcia MRN: [id number] Date of Birth: February 20, 1955 Referring Provider (PT): Paralee Cancel MD   Encounter Date: 11/04/2019   PT End of Session - 11/04/19 1111    Visit Number 6    Number of Visits 8    Date for PT Re-Evaluation 11/15/19    Authorization Type [employer]; no auth required [id number]    Authorization Time Period 03/30/19 - 05/11/19    Authorization - Visit Number 31    Authorization - Number of Visits 30    Progress Note Due on Visit 19    PT Start Time 1050    PT Stop Time 1132    PT Time Calculation (min) 42 min    Activity Tolerance Patient tolerated treatment well    Behavior During Therapy [facility] for tasks assessed/performed           Past Medical History:  Diagnosis Date  . Achilles tendinitis   . Arthritis   . Asthma   . Clotting disorder ([place])   . Diverticulitis   . Diverticulosis   . GERD (gastroesophageal reflux disease)   . Heart murmur    as a child  . History of blood clots   . History of chicken pox   . History of pulmonary embolus (PE) 2018  . Hyperlipidemia   . Hypertension   . Liver cirrhosis secondary to NASH (nonalcoholic steatohepatitis) ([place])   . Osteoarthritis   . Phlebitis   . Plantar fasciitis   . Sleep apnea    not wearing CPAP at this time  . Vertigo     Past Surgical History:  Procedure Laterality Date  . ABDOMINAL HYSTERECTOMY    . bone spur removal    . COLONOSCOPY    . KNEE ARTHROSCOPY Right   . KNEE ARTHROSCOPY Right   . 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   Procedure: TOTAL KNEE ARTHROPLASTY;  Surgeon: Paralee Cancel, MD;  Location: [facility];  Service: Orthopedics;  Laterality: Right;  70 mins    There were no vitals filed for this visit.   Subjective Assessment - 11/04/19 1055    Subjective Pt stated she continues to have  pain on Lt side ever since she fell on 09/13/19.  Constant achey and stiff pain scale 5/10.  Reports compliance iwth HEP daily, stated she likes the stretches at home.    Pertinent History hx of B DVT    Patient Stated Goals strengthen her back for standing    Currently in Pain? Yes    Pain Score 5     Pain Location Back    Pain Orientation Lower;Left    Pain Descriptors / Indicators Aching    Pain Type Chronic pain    Pain Onset More than a month ago    Pain Frequency Intermittent    Aggravating Factors  activity    Pain Relieving Factors rest    Effect of Pain on Daily Activities limits              [facility] PT Assessment - 11/04/19 0001      Assessment   Medical Diagnosis Lumbar DDD    Referring Provider (PT) Paralee Cancel MD    Onset Date/Surgical Date 09/13/19    Next MD Visit none scheduled    Prior Therapy R TKA      Precautions  Precautions None      Observation/Other Assessments   Focus on Therapeutic Outcomes (FOTO)  56% limited   was 55% limited                        [facility] Adult PT Treatment/Exercise - 11/04/19 0001      Exercises   Exercises Lumbar      Lumbar Exercises: Stretches   Quadruped Mid Back Stretch 1 rep;30 seconds;3 reps    Quadruped Mid Back Stretch Limitations child's pose 1x; standing thoracic extension with UE on mat; 3rd set BUE to Rt to address Lt side      Lumbar Exercises: Standing   Heel Raises 10 reps    Heel Raises Limitations wall arch 10x    Functional Squats 10 reps    Functional Squats Limitations 3D hip excursion; with UE reaches 5" during lateral       Lumbar Exercises: Prone   Straight Leg Raise 10 reps      Manual Therapy   Manual Therapy Soft tissue mobilization    Manual therapy comments Manual complete separate than rest of tx    Soft tissue mobilization Prone: STM Lt QL/lats                  PT Education - 11/04/19 1157    Education Details Educated on benefits with rolling pin for self  manual care.    Methods Explanation;Demonstration    Comprehension Verbalized understanding            PT Short Term Goals - 11/02/19 1602      PT SHORT TERM GOAL #1   Title Patient will be independent with HEP in order to improve functional outcomes.    Time 2    Period Weeks    Status Achieved    Target Date 11/01/19      PT SHORT TERM GOAL #2   Title Patient will report at least 25% improvement in overall symptoms and/or functional ability.    Time 2    Period Weeks    Status On-going    Target Date 11/01/19      PT SHORT TERM GOAL #3   Status On-going             PT Long Term Goals - 11/02/19 1602      PT LONG TERM GOAL #1   Title Patient will report at least 75% improvement in symptoms for improved quality of life.    Time 4    Period Weeks    Status On-going      PT LONG TERM GOAL #2   Title Patient will improve FOTO score by at least 10 points in order to indicate improved tolerance to activity.    Time 4    Period Weeks    Status On-going      PT LONG TERM GOAL #3   Title Patient will be able to complete 5x STS in under 11.4 seconds in order to reduce the risk of falls.    Time 4    Period Weeks    Status On-going      PT LONG TERM GOAL #4   Title Patient will demonstrate at least 25% improvement in lumbar ROM in all planes for improved ability to move trunk while completing household chores.    Time 4    Period Weeks    Status On-going      PT LONG TERM GOAL #5   Status  On-going                 Plan - 11/04/19 1151    Clinical Impression Statement Lt continued to be limited by Lt LBP, monitored through session.  Added STM to lower back with tenderness Lt QL and lats with palpation.  Added lumbar stretches and continued wiht stabilization exercises.  Reports pain reduced to 4/10 at EOS.    Personal Factors and Comorbidities Time since onset of injury/illness/exacerbation;Comorbidity 2;Fitness    Comorbidities Hx of DVT, HTN     Examination-Activity Limitations Stand;Locomotion Level;Transfers;Squat;Stairs;Bed Mobility;Bathing;Sit;Caring for Others;Lift;Hygiene/Grooming;Dressing;Toileting    Examination-Participation Restrictions Driving;Community Activity;Cleaning;Shop;Meal Prep;Laundry    Stability/Clinical Decision Making Stable/Uncomplicated    Clinical Decision Making Low    Rehab Potential Good    PT Frequency 2x / week    PT Duration 4 weeks    PT Treatment/Interventions ADLs/Self Care Home Management;Aquatic Therapy;Cryotherapy;Electrical Stimulation;Moist Heat;DME Instruction;Gait training;Stair training;Functional mobility training;Therapeutic activities;Therapeutic exercise;Balance training;Neuromuscular re-education;Patient/family education;Orthotic Fit/Training;Manual techniques;Passive range of motion;Dry needling;Energy conservation;Splinting;Taping;Scar mobilization    PT Next Visit Plan progress core and hip strengthening exercise, lumbar mobility exercises, possibly manual for pain/mobility    PT Home Exercise Plan 9/14 begin doing R TKA HEP; 9/16: LTR, SKTC, bridge 9/20 prone hip ext, ab set, marches, dead bug iso;9/24:  hip excursions  quad stretch  toe tap ,heel raises squats; 10/01: modified child's pose standing           Patient will benefit from skilled therapeutic intervention in order to improve the following deficits and impairments:  Abnormal gait, Increased fascial restricitons, Improper body mechanics, Pain, Decreased mobility, Decreased activity tolerance, Decreased endurance, Decreased range of motion, Decreased strength, Hypomobility, Difficulty walking, Decreased knowledge of precautions, Decreased knowledge of use of DME, Decreased scar mobility, Decreased balance  Visit Diagnosis: Low back pain, unspecified back pain laterality, unspecified chronicity, unspecified whether sciatica present  Muscle weakness (generalized)  Other abnormalities of gait and mobility  Other symptoms and  signs involving the musculoskeletal system  Chronic pain of right knee     Problem List Patient Active Problem List   Diagnosis Date Noted  . S/P right TKA 06/07/2019  . History of DVT (deep vein thrombosis) 04/26/2018  . Current use of long term anticoagulation 04/26/2018  . 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 Chronic pain of right knee 10/22/2017  . Right foot pain 10/22/2017  . Neck pain 10/22/2017  . Obesity (BMI 35.0-39.9 without comorbidity) 10/22/2017  . Healthcare maintenance 10/20/2017  . History of pulmonary embolus (PE) 10/20/2017  . GERD (gastroesophageal reflux disease) 09/08/2017  . Cough 08/11/2017  . Essential hypertension 08/11/2017  . Obstructive sleep apnea 08/11/2017   Ihor Austin, LPTA/CLT; CBIS [phone]  Aldona Lento 11/04/2019, 1:01 PM  [place], [place], [address] Phone: [phone]   Fax:  [phone]  Name: JAN WALTERS MRN: [id number] Date of Birth: Mar 13, 1955

## 2019-11-08 ENCOUNTER — Encounter (HOSPITAL_COMMUNITY): Payer: Self-pay | Admitting: Physical Therapy

## 2019-11-08 ENCOUNTER — Ambulatory Visit (HOSPITAL_COMMUNITY): Payer: 59 | Admitting: Physical Therapy

## 2019-11-08 ENCOUNTER — Other Ambulatory Visit: Payer: Self-pay

## 2019-11-08 DIAGNOSIS — R29898 Other symptoms and signs involving the musculoskeletal system: Secondary | ICD-10-CM

## 2019-11-08 DIAGNOSIS — R2689 Other abnormalities of gait and mobility: Secondary | ICD-10-CM

## 2019-11-08 DIAGNOSIS — M6281 Muscle weakness (generalized): Secondary | ICD-10-CM

## 2019-11-08 DIAGNOSIS — M545 Low back pain, unspecified: Secondary | ICD-10-CM | POA: Diagnosis not present

## 2019-11-08 NOTE — Therapy (Signed)
[place], [place], [address] Phone: [phone]   Fax:  [phone]  Physical Therapy Treatment  Patient Details  Name: Isabel Garcia MRN: [id number] Date of Birth: Jun 14, 1955 Referring Provider (PT): Paralee Cancel MD   Encounter Date: 11/08/2019   PT End of Session - 11/08/19 1408    Visit Number 7    Number of Visits 8    Date for PT Re-Evaluation 11/15/19    Authorization Type [employer]; no auth required [id number]    Authorization Time Period 03/30/19 - 05/11/19    Authorization - Visit Number 28    Authorization - Number of Visits 30    PT Start Time 0300    PT Stop Time 9233    PT Time Calculation (min) 38 min    Activity Tolerance Patient tolerated treatment well    Behavior During Therapy [facility] for tasks assessed/performed           Past Medical History:  Diagnosis Date   Achilles tendinitis    Arthritis    Asthma    Clotting disorder ([place])    Diverticulitis    Diverticulosis    GERD (gastroesophageal reflux disease)    Heart murmur    as a child   History of blood clots    History of chicken pox    History of pulmonary embolus (PE) 2018   Hyperlipidemia    Hypertension    Liver cirrhosis secondary to NASH (nonalcoholic steatohepatitis) ([place])    Osteoarthritis    Phlebitis    Plantar fasciitis    Sleep apnea    not wearing CPAP at this time   Vertigo     Past Surgical History:  Procedure Laterality Date   ABDOMINAL HYSTERECTOMY     bone spur removal     COLONOSCOPY     KNEE ARTHROSCOPY Right    KNEE ARTHROSCOPY Right    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   Procedure: TOTAL KNEE ARTHROPLASTY;  Surgeon: Paralee Cancel, MD;  Location: [facility];  Service: Orthopedics;  Laterality: Right;  70 mins    There were no vitals filed for this visit.   Subjective Assessment - 11/08/19 1409    Subjective Patient states her back has been feeling worse. She continues to  feel like she has a knot in her back. Her home exercises are going well. She has not had any symptoms into LLE.    Pertinent History hx of B DVT    Patient Stated Goals strengthen her back for standing    Currently in Pain? Yes    Pain Score 5     Pain Location Back    Pain Onset More than a month ago                             [facility] Adult PT Treatment/Exercise - 11/08/19 0001      Lumbar Exercises: Stretches   Other Lumbar Stretch Exercise Seated lat/QL stretch 5x10 second holds bilateral       Lumbar Exercises: Standing   Row Both;15 reps    Theraband Level (Row) Level 4 (Blue)    Row Limitations 3 sets    Shoulder Extension 15 reps;Both    Theraband Level (Shoulder Extension) Level 4 (Blue)    Shoulder Extension Limitations 3 sets      Manual Therapy   Manual Therapy Soft tissue mobilization    Manual therapy comments Manual  complete separate than rest of tx    Soft tissue mobilization Self STM with tennis ball instruction and performance to lumbar paraspinals                  PT Education - 11/08/19 1413    Education Details Patient educated on HEP, exercise mechanics    Person(s) Educated Patient    Methods Explanation;Demonstration    Comprehension Verbalized understanding;Returned demonstration            PT Short Term Goals - 11/02/19 1602      PT SHORT TERM GOAL #1   Title Patient will be independent with HEP in order to improve functional outcomes.    Time 2    Period Weeks    Status Achieved    Target Date 11/01/19      PT SHORT TERM GOAL #2   Title Patient will report at least 25% improvement in overall symptoms and/or functional ability.    Time 2    Period Weeks    Status On-going    Target Date 11/01/19      PT SHORT TERM GOAL #3   Status On-going             PT Long Term Goals - 11/02/19 1602      PT LONG TERM GOAL #1   Title Patient will report at least 75% improvement in symptoms for improved quality of  life.    Time 4    Period Weeks    Status On-going      PT LONG TERM GOAL #2   Title Patient will improve FOTO score by at least 10 points in order to indicate improved tolerance to activity.    Time 4    Period Weeks    Status On-going      PT LONG TERM GOAL #3   Title Patient will be able to complete 5x STS in under 11.4 seconds in order to reduce the risk of falls.    Time 4    Period Weeks    Status On-going      PT LONG TERM GOAL #4   Title Patient will demonstrate at least 25% improvement in lumbar ROM in all planes for improved ability to move trunk while completing household chores.    Time 4    Period Weeks    Status On-going      PT LONG TERM GOAL #5   Status On-going                 Plan - 11/08/19 1408    Clinical Impression Statement Patient tolerates seated lat and QL stretch with improvement in symptoms following. Patient states continued knot feeling in low back. Instructed patient on self stm with tennis ball for overactive lumbar paraspinals for pain relief and decreasing tissue tension which patient performs well. Patient educated on use of lumbar roll for sitting posture. Patient tolerates additional postural strengthening with resistance bands. Patient given cueing for core activation throughout session. D/c next session. Patient will benefit from skilled physical therapy in order to improve function and reduce impairment.    Personal Factors and Comorbidities Time since onset of injury/illness/exacerbation;Comorbidity 2;Fitness    Comorbidities Hx of DVT, HTN    Examination-Activity Limitations Stand;Locomotion Level;Transfers;Squat;Stairs;Bed Mobility;Bathing;Sit;Caring for Others;Lift;Hygiene/Grooming;Dressing;Toileting    Examination-Participation Restrictions Driving;Community Activity;Cleaning;Shop;Meal Prep;Laundry    Stability/Clinical Decision Making Stable/Uncomplicated    Rehab Potential Good    PT Frequency 2x / week    PT Duration 4  weeks    PT Treatment/Interventions ADLs/Self Care Home Management;Aquatic Therapy;Cryotherapy;Electrical Stimulation;Moist Heat;DME Instruction;Gait training;Stair training;Functional mobility training;Therapeutic activities;Therapeutic exercise;Balance training;Neuromuscular re-education;Patient/family education;Orthotic Fit/Training;Manual techniques;Passive range of motion;Dry needling;Energy conservation;Splinting;Taping;Scar mobilization    PT Next Visit Plan check goals and d/c next session; progress core and hip strengthening exercise, lumbar mobility exercises, possibly manual for pain/mobility    PT Home Exercise Plan 9/14 begin doing R TKA HEP; 9/16: LTR, SKTC, bridge 9/20 prone hip ext, ab set, marches, dead bug iso;9/24:  hip excursions  quad stretch  toe tap ,heel raises squats; 10/01: modified child's pose standing 10/5 row, ext self stm, lumbar roll, seated QL/lat stretch           Patient will benefit from skilled therapeutic intervention in order to improve the following deficits and impairments:  Abnormal gait, Increased fascial restricitons, Improper body mechanics, Pain, Decreased mobility, Decreased activity tolerance, Decreased endurance, Decreased range of motion, Decreased strength, Hypomobility, Difficulty walking, Decreased knowledge of precautions, Decreased knowledge of use of DME, Decreased scar mobility, Decreased balance  Visit Diagnosis: Low back pain, unspecified back pain laterality, unspecified chronicity, unspecified whether sciatica present  Muscle weakness (generalized)  Other abnormalities of gait and mobility  Other symptoms and signs involving the musculoskeletal system     Problem List Patient Active Problem List   Diagnosis Date Noted   S/P right TKA 06/07/2019   History of DVT (deep vein thrombosis) 04/26/2018   Current use of long term anticoagulation 04/26/2018   Chronic low back pain with right-sided sciatica 10/22/2017   Chronic pain  of right knee 10/22/2017   Right foot pain 10/22/2017   Neck pain 10/22/2017   Obesity (BMI 35.0-39.9 without comorbidity) 10/22/2017   Healthcare maintenance 10/20/2017   History of pulmonary embolus (PE) 10/20/2017   GERD (gastroesophageal reflux disease) 09/08/2017   Cough 08/11/2017   Essential hypertension 08/11/2017   Obstructive sleep apnea 08/11/2017    2:46 PM, 11/08/19 Mearl Latin PT, DPT Physical Therapist at [place], [place], [address] Phone: [phone]   Fax:  [phone]  Name: Isabel Garcia MRN: [id number] Date of Birth: 10-08-1955

## 2019-11-08 NOTE — Patient Instructions (Signed)
Access Code: [email or web address] URL: [email or web address] Date: 11/08/2019 Prepared by: Mitzi Hansen Shemiah Rosch  Exercises Seated Alternating Side Stretch with Arm Overhead - 1 x daily - 7 x weekly - 5 reps - 10 second hold Standing massage with ball at wall on low back - 1 x daily - 7 x weekly Correct Seated Posture - 1 x daily - 7 x weekly Standing Shoulder Row with Anchored Resistance - 1 x daily - 7 x weekly - 3 sets - 15 reps Shoulder extension with resistance - Neutral - 1 x daily - 7 x weekly - 3 sets - 15 reps

## 2019-11-10 ENCOUNTER — Other Ambulatory Visit: Payer: Self-pay

## 2019-11-10 ENCOUNTER — Ambulatory Visit (HOSPITAL_COMMUNITY): Payer: 59 | Admitting: Physical Therapy

## 2019-11-10 ENCOUNTER — Encounter (HOSPITAL_COMMUNITY): Payer: Self-pay | Admitting: Physical Therapy

## 2019-11-10 DIAGNOSIS — M545 Low back pain, unspecified: Secondary | ICD-10-CM | POA: Diagnosis not present

## 2019-11-10 DIAGNOSIS — M6281 Muscle weakness (generalized): Secondary | ICD-10-CM

## 2019-11-10 DIAGNOSIS — R2689 Other abnormalities of gait and mobility: Secondary | ICD-10-CM

## 2019-11-10 NOTE — Therapy (Signed)
[facility] [address] [place], [place], [address] Phone: [phone]   Fax:  [phone]  Physical Therapy Treatment  Patient Details  Name: Isabel Garcia MRN: [id number] Date of Birth: [birth year] Referring Provider (PT): Paralee Cancel MD  PHYSICAL THERAPY DISCHARGE SUMMARY  Visits from Start of Care: 8  Current functional level related to goals / functional outcomes: See below Remaining deficits: See below    Education / Equipment: HEP Plan: Patient agrees to discharge.  Patient goals were partially met. Patient is being discharged due to financial reasons.  ?????     Encounter Date: 11/10/2019   PT End of Session - 11/10/19 1430    Visit Number 8    Number of Visits 8    Date for PT Re-Evaluation 11/15/19    Authorization Type [employer]; no auth required [id number]    Authorization Time Period 03/30/19 - 05/11/19    Authorization - Visit Number 66    Authorization - Number of Visits 30    PT Start Time 1497    PT Stop Time 1435    PT Time Calculation (min) 32 min    Activity Tolerance Patient tolerated treatment well    Behavior During Therapy WFL for tasks assessed/performed           Past Medical History:  Diagnosis Date  . Achilles tendinitis   . Arthritis   . Asthma   . Clotting disorder ([place])   . Diverticulitis   . Diverticulosis   . GERD (gastroesophageal reflux disease)   . Heart murmur    as a child  . History of blood clots   . History of chicken pox   . History of pulmonary embolus (PE) 2018  . Hyperlipidemia   . Hypertension   . Liver cirrhosis secondary to NASH (nonalcoholic steatohepatitis) ([place])   . Osteoarthritis   . Phlebitis   . Plantar fasciitis   . Sleep apnea    not wearing CPAP at this time  . Vertigo     Past Surgical History:  Procedure Laterality Date  . ABDOMINAL HYSTERECTOMY    . bone spur removal    . COLONOSCOPY    . KNEE ARTHROSCOPY Right   . KNEE ARTHROSCOPY Right   .  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   Procedure: TOTAL KNEE ARTHROPLASTY;  Surgeon: Paralee Cancel, MD;  Location: [facility];  Service: Orthopedics;  Laterality: Right;  70 mins    There were no vitals filed for this visit.   Subjective Assessment - 11/10/19 1404    Subjective PT continues to have tightness in her back. She continues to do her exercises.    Pertinent History hx of B DVT    Limitations Standing;Walking;Sitting    How long can you sit comfortably? 60 minutes was 30 minutes because feet start swelling    How long can you stand comfortably? 30 minutes was 30 minutes    How long can you walk comfortably? 45 minutes was 5 minutes    Patient Stated Goals strengthen her back for standing    Pain Score 6     Pain Location Back    Pain Orientation Lower    Pain Descriptors / Indicators Aching    Pain Type Acute pain    Pain Onset More than a month ago    Pain Frequency Intermittent    Aggravating Factors  activity    Pain Relieving Factors rest    Effect of Pain on Daily Activities  limits              [facility] PT Assessment - 11/10/19 0001      Assessment   Medical Diagnosis Lumbar DDD    Referring Provider (PT) Paralee Cancel MD    Onset Date/Surgical Date 09/13/19    Next MD Visit none scheduled    Prior Therapy R TKA      Precautions   Precautions None      Restrictions   Weight Bearing Restrictions No      Home Environment   Living Environment Private residence      Prior Function   Level of Independence Independent;Independent with basic ADLs    Vocation Retired    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, 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      Cognition   Overall Cognitive Status Within Functional Limits for tasks assessed      Observation/Other Assessments   Observations Ambulating with limited knee flexion extension ROM on R due to brace, no AD    Focus on Therapeutic Outcomes (FOTO)   60% limited was 55% limited      AROM    Overall AROM Comments slight low back pain with all AROM    Lumbar Flexion 10% limited   was 25% limited    Lumbar Extension 50% limited   was 75% limited    Lumbar - Right Side Bend 30% limited   was 75% limited    Lumbar - Left Side Bend 40% limited   was 75%    Lumbar - Right Rotation 0% limited   was limited 25%   Lumbar - Left Rotation 0% limited   was 25% limited      Strength   Right Hip Flexion 4/5   was 4/5    Right Hip Extension 4+/5   was 3+   Left Hip Flexion 4/5   was 4/5    Left Hip Extension 4+/5   was 3+   Right Knee Flexion 4+/5    Right Knee Extension 5/5   was 4-    Left Knee Flexion 5/5    Left Knee Extension 5/5    Right Ankle Dorsiflexion 5/5    Left Ankle Dorsiflexion 5/5      Transfers   Five time sit to stand comments  39.57, 43. 18 seconds    Comments NO UE, was slow, labored, no UE use, relies on LLE      Ambulation/Gait   Ambulation/Gait Yes    Ambulation/Gait Assistance 6: Modified independent (Device/Increase time)    Ambulation Distance (Feet) 275 Feet   2 mwt    Assistive device None    Gait Pattern Decreased hip/knee flexion - right;Trunk flexed;Antalgic                                   PT Short Term Goals - 11/10/19 1430      PT SHORT TERM GOAL #1   Title Patient will be independent with HEP in order to improve functional outcomes.    Time 2    Period Weeks    Status Achieved    Target Date 11/01/19      PT SHORT TERM GOAL #2   Title Patient will report at least 25% improvement in overall symptoms and/or functional ability.    Time 2    Period Weeks    Status Achieved  Target Date 11/01/19      PT SHORT TERM GOAL #3   Status --             PT Long Term Goals - 11/10/19 1431      PT LONG TERM GOAL #1   Title Patient will report at least 75% improvement in symptoms for improved quality of life.    Baseline Pt states at least 70%    Time 4    Period Weeks    Status On-going      PT LONG TERM  GOAL #2   Title Patient will improve FOTO score by at least 10 points in order to indicate improved tolerance to activity.    Time 4    Period Weeks    Status Not Met      PT LONG TERM GOAL #3   Title Patient will be able to complete 5x STS in under 11.4 seconds in order to reduce the risk of falls.    Time 4    Period Weeks    Status Not Met      PT LONG TERM GOAL #4   Title Patient will demonstrate at least 25% improvement in lumbar ROM in all planes for improved ability to move trunk while completing household chores.    Time 4    Period Weeks    Status Achieved      PT LONG TERM GOAL #5   Title Patient will be able to walk around for 10 minutes at a time without her cane to demonstrate improved walking endurance.    Status --                 Plan - 11/10/19 1433    Clinical Impression Statement PT reassessed; due to having a 30 visit limit pt will be discharged to HEP today as pt has used a significant number of visits earlier this year with a TKR. PT has improved in all aspects, however, she still has limits in all aspects as well.  PT has been given an in depth HEP and should continue to progress on her own at home.  Plan is to discharge.    Personal Factors and Comorbidities Time since onset of injury/illness/exacerbation;Comorbidity 2;Fitness    Comorbidities Hx of DVT, HTN    Examination-Activity Limitations Stand;Locomotion Level;Transfers;Squat;Stairs;Bed Mobility;Bathing;Sit;Caring for Others;Lift;Hygiene/Grooming;Dressing;Toileting    Examination-Participation Restrictions Driving;Community Activity;Cleaning;Shop;Meal Prep;Laundry    Stability/Clinical Decision Making Stable/Uncomplicated    Rehab Potential Good    PT Frequency 2x / week    PT Duration 4 weeks    PT Treatment/Interventions ADLs/Self Care Home Management;Aquatic Therapy;Cryotherapy;Electrical Stimulation;Moist Heat;DME Instruction;Gait training;Stair training;Functional mobility  training;Therapeutic activities;Therapeutic exercise;Balance training;Neuromuscular re-education;Patient/family education;Orthotic Fit/Training;Manual techniques;Passive range of motion;Dry needling;Energy conservation;Splinting;Taping;Scar mobilization    PT Next Visit Plan Discharge    PT Home Exercise Plan 9/14 begin doing R TKA HEP; 9/16: LTR, [place], bridge 9/20 prone hip ext, ab set, marches, dead bug iso;9/24:  hip excursions  quad stretch  toe tap ,heel raises squats; 10/01: modified child's pose standing 10/5 row, ext self stm, lumbar roll, seated QL/lat stretch           Patient will benefit from skilled therapeutic intervention in order to improve the following deficits and impairments:  Abnormal gait, Increased fascial restricitons, Improper body mechanics, Pain, Decreased mobility, Decreased activity tolerance, Decreased endurance, Decreased range of motion, Decreased strength, Hypomobility, Difficulty walking, Decreased knowledge of precautions, Decreased knowledge of use of DME, Decreased scar mobility, Decreased balance  Visit  Diagnosis: Low back pain, unspecified back pain laterality, unspecified chronicity, unspecified whether sciatica present  Muscle weakness (generalized)  Other abnormalities of gait and mobility     Problem List Patient Active Problem List   Diagnosis Date Noted  . S/P right TKA 06/07/2019  . History of DVT (deep vein thrombosis) 04/26/2018  . Current use of long term anticoagulation 04/26/2018  . 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 Chronic pain of right knee 10/22/2017  . Right foot pain 10/22/2017  . Neck pain 10/22/2017  . Obesity (BMI 35.0-39.9 without comorbidity) 10/22/2017  . Healthcare maintenance 10/20/2017  . History of pulmonary embolus (PE) 10/20/2017  . GERD (gastroesophageal reflux disease) 09/08/2017  . Cough 08/11/2017  . Essential hypertension 08/11/2017  . Obstructive sleep apnea 08/11/2017     Rayetta Humphrey, PT CLT [id number] 11/10/2019, 2:40 PM  [facility] [place], [place], [address] Phone: [phone]   Fax:  [phone]  Name: Isabel Garcia MRN: [id number] Date of Birth: 1955/07/23

## 2019-11-21 ENCOUNTER — Other Ambulatory Visit: Payer: Self-pay

## 2019-11-21 ENCOUNTER — Other Ambulatory Visit: Payer: Self-pay | Admitting: Pulmonary Disease

## 2019-11-21 ENCOUNTER — Ambulatory Visit (INDEPENDENT_AMBULATORY_CARE_PROVIDER_SITE_OTHER): Payer: 59 | Admitting: *Deleted

## 2019-11-21 DIAGNOSIS — Z86711 Personal history of pulmonary embolism: Secondary | ICD-10-CM

## 2019-11-21 DIAGNOSIS — Z23 Encounter for immunization: Secondary | ICD-10-CM | POA: Diagnosis not present

## 2019-11-21 DIAGNOSIS — K219 Gastro-esophageal reflux disease without esophagitis: Secondary | ICD-10-CM

## 2019-11-21 DIAGNOSIS — R059 Cough, unspecified: Secondary | ICD-10-CM

## 2019-11-21 MED ORDER — OMEPRAZOLE 40 MG PO CPDR: 40.0000 mg | DELAYED_RELEASE_CAPSULE | Freq: Every day | ORAL | 1 refills | Status: DC

## 2020-05-07 ENCOUNTER — Inpatient Hospital Stay (HOSPITAL_COMMUNITY): Payer: Self-pay

## 2020-05-14 ENCOUNTER — Ambulatory Visit (HOSPITAL_COMMUNITY): Payer: 59 | Admitting: Hematology and Oncology

## 2020-12-13 ENCOUNTER — Telehealth: Payer: Self-pay | Admitting: Family

## 2020-12-13 NOTE — Telephone Encounter (Signed)
Patient would like to transfer care to [facility] because the location is closer. Please advise

## 2020-12-24 IMAGING — CR DG HIP (WITH OR WITHOUT PELVIS) 2-3V*R*
3 series · 3 of 3 positions shown · non-contrast
Comparison: None.

CLINICAL DATA: Right hip pain after fall

EXAM:
DG HIP (WITH OR WITHOUT PELVIS) 2-3V RIGHT

[t pelvis ap]
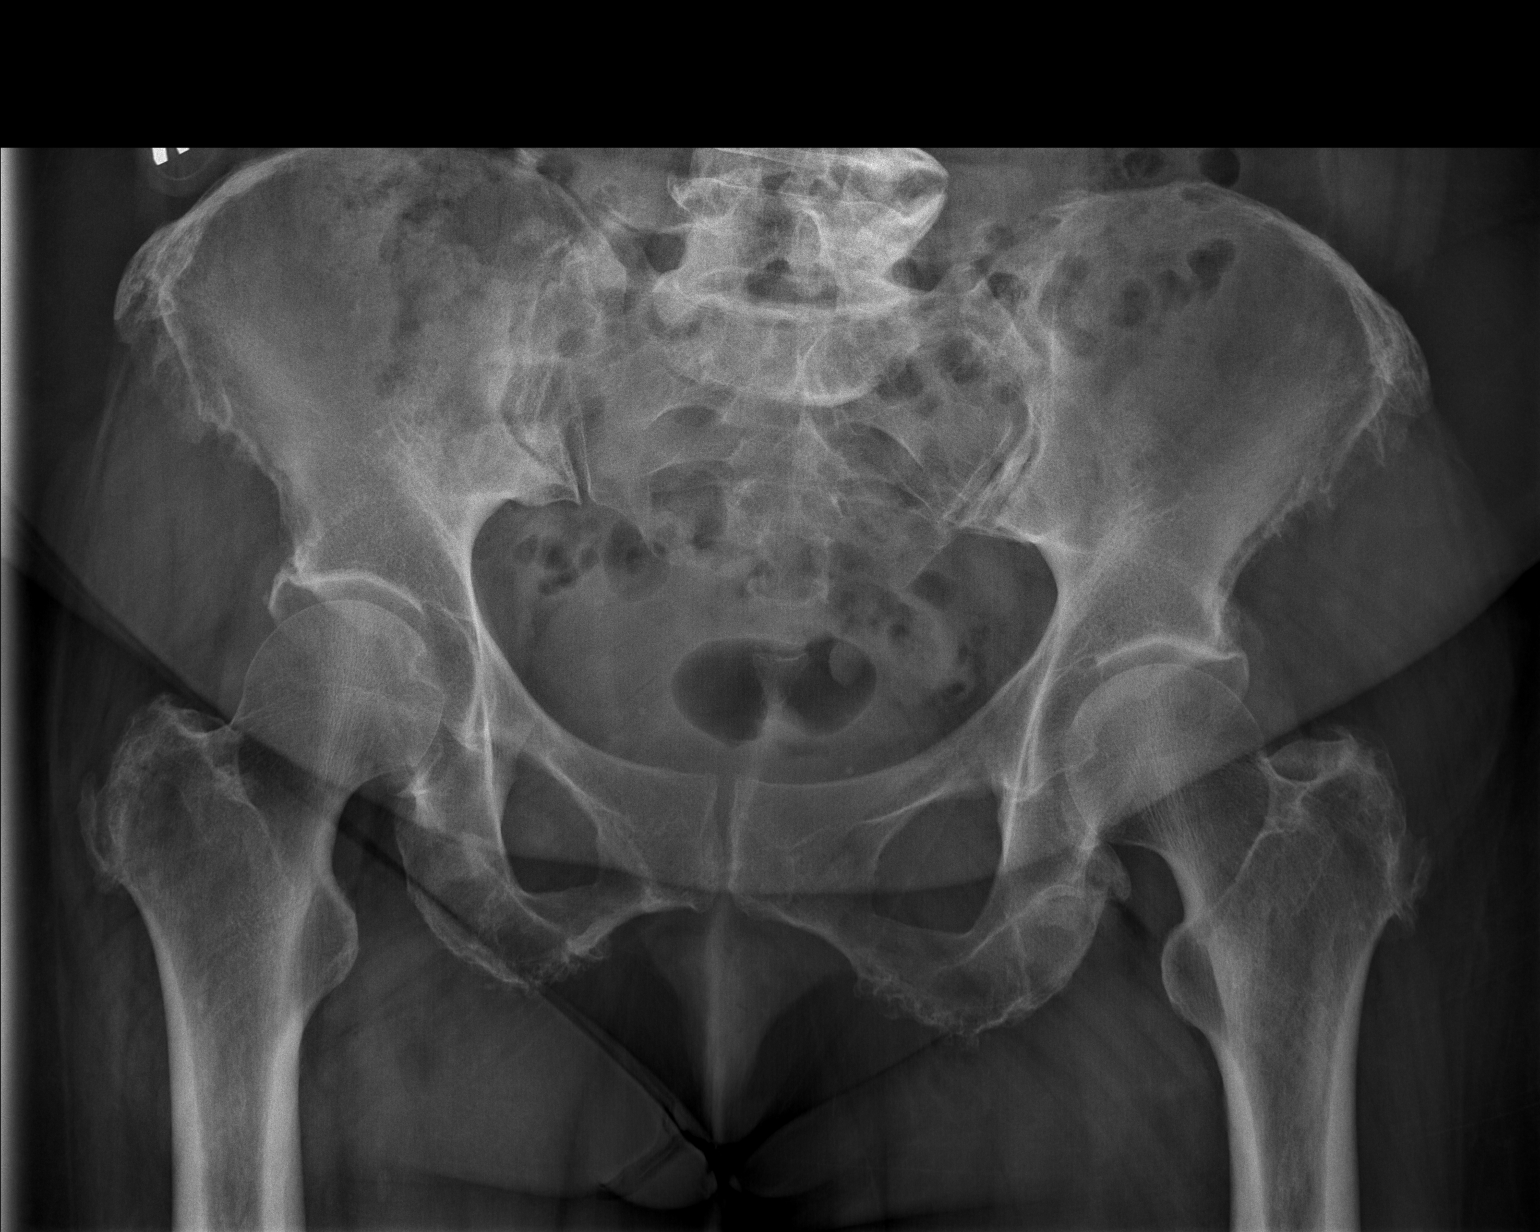

[t hip ap right]
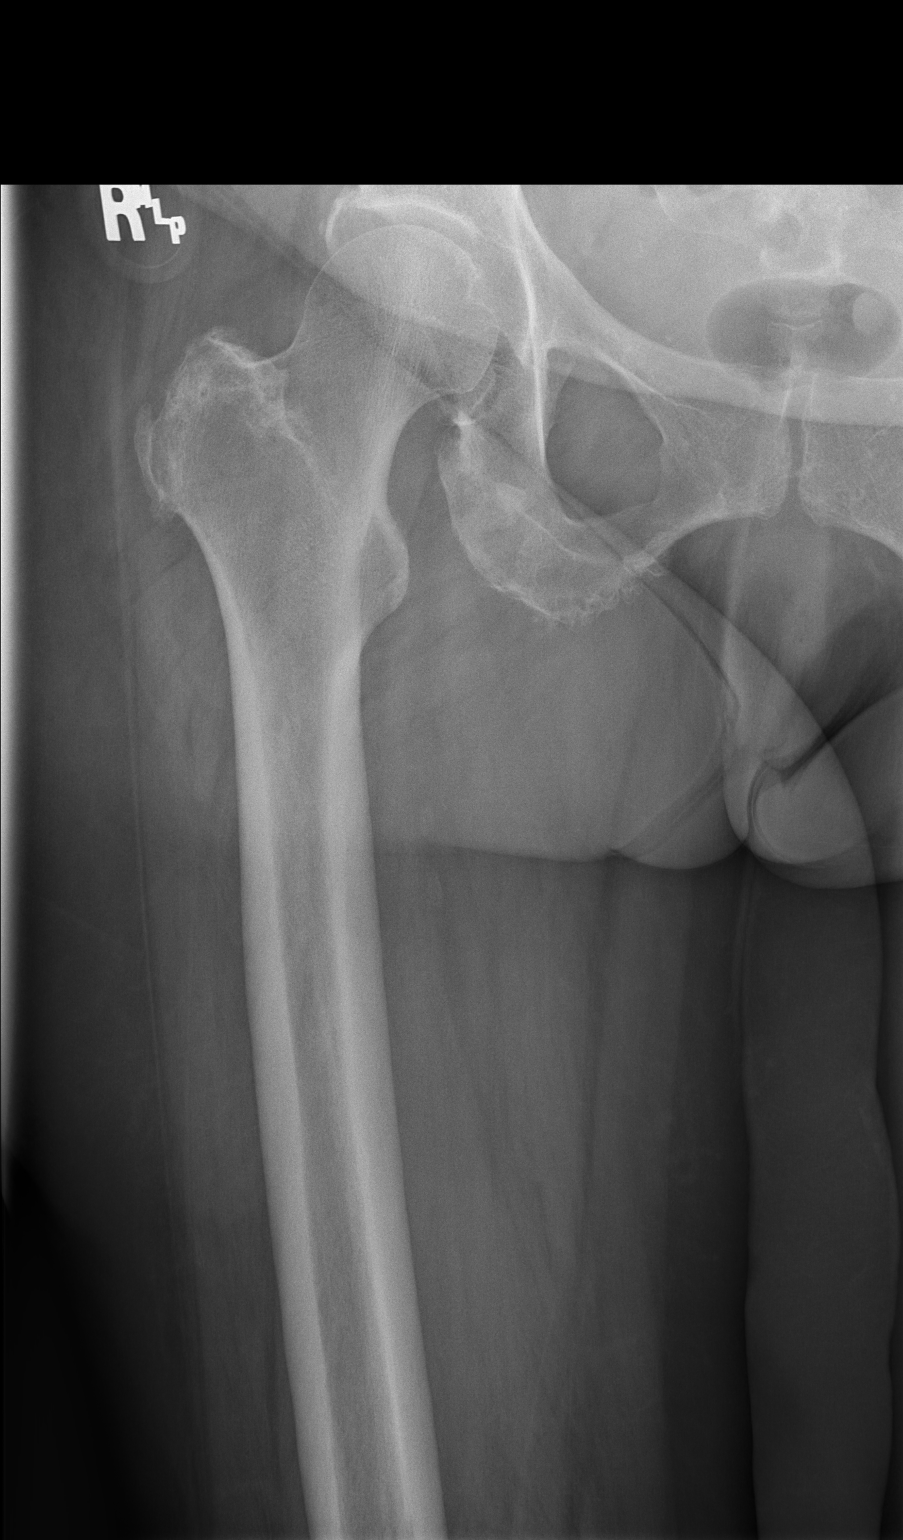

[t hip frog leg right]
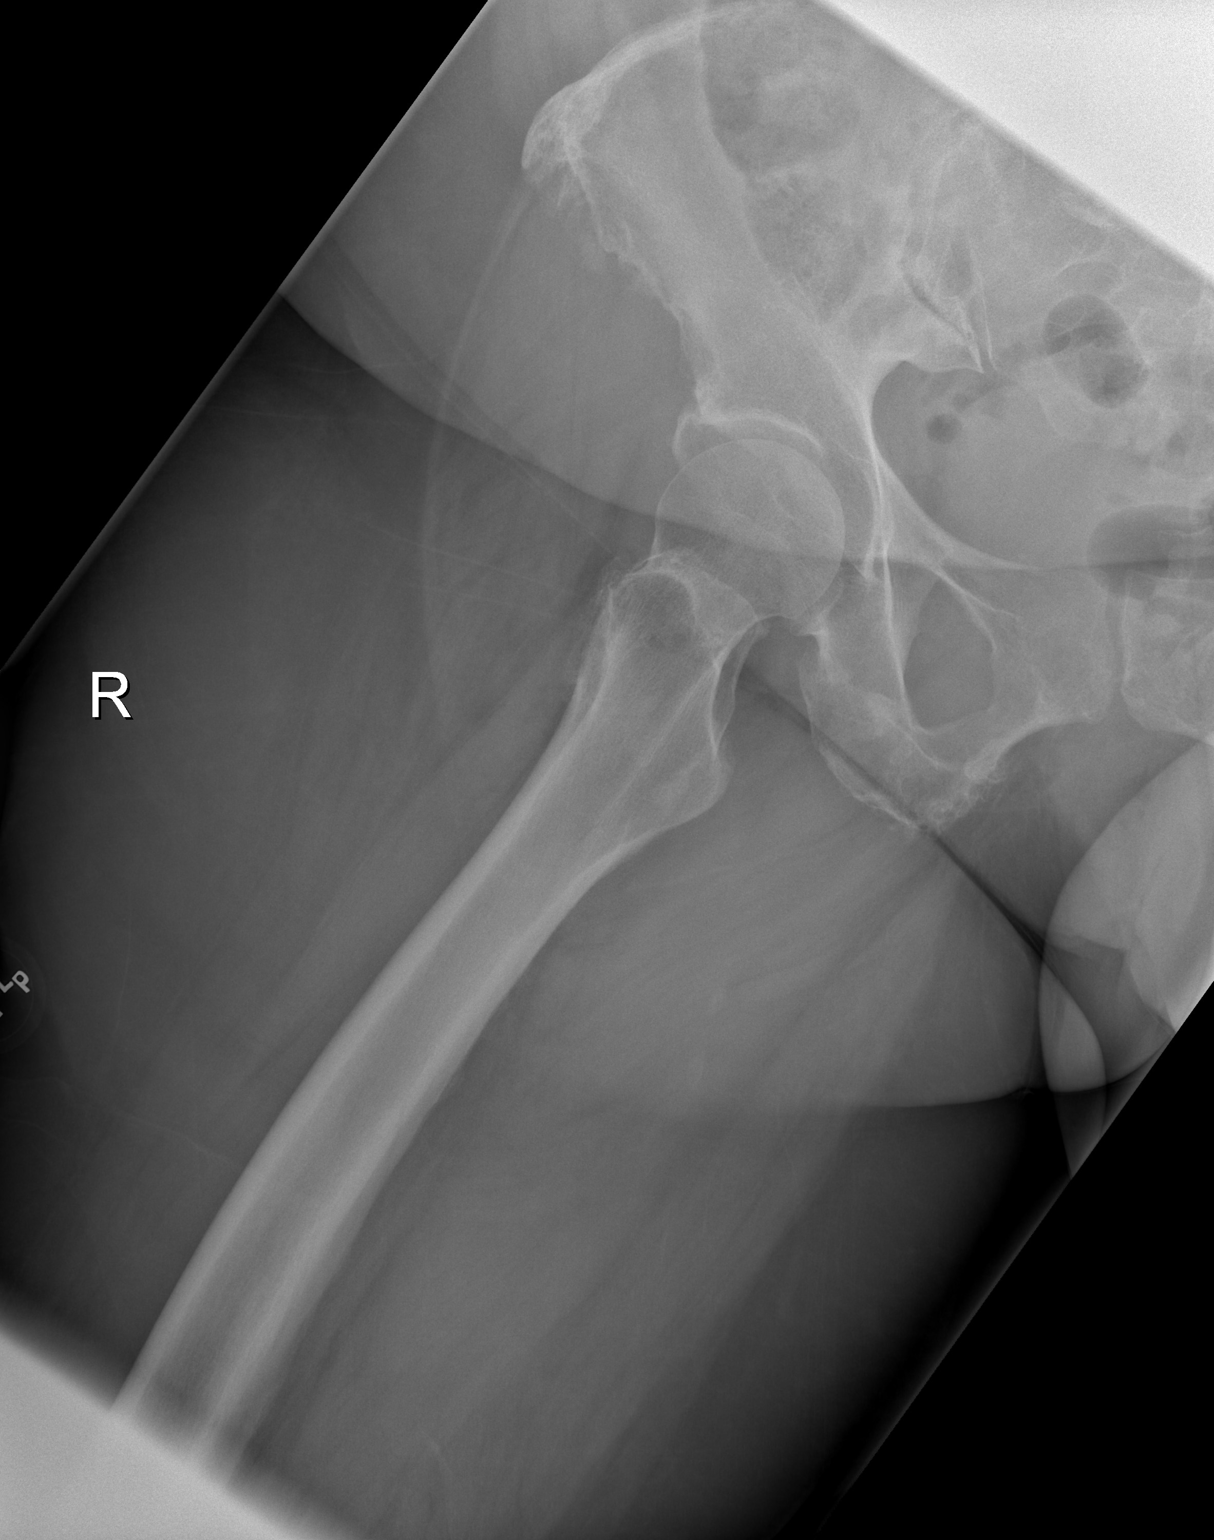

[3 of 3 positions shown; findings below may reference images not displayed]

FINDINGS: There is no evidence of hip fracture or dislocation. There is no
evidence of arthropathy or other focal bone abnormality.
IMPRESSION: Negative.

## 2020-12-24 IMAGING — CT CT CERVICAL SPINE W/O CM
3 of 4 series · 13 of 33 positions shown, 16 images · non-contrast
Comparison: None.

CLINICAL DATA: Fall, head injury, headache, neck pain

EXAM:
CT HEAD WITHOUT CONTRAST
CT CERVICAL SPINE WITHOUT CONTRAST
TECHNIQUE: Multidetector CT imaging of the head and cervical spine was
performed following the standard protocol without intravenous
contrast. Multiplanar CT image reconstructions of the cervical spine
were also generated.

[Series 7: sagittal bone · sagittal · 0.27mm/px · 5 of 61 slices shown, 6 images]
[im 21/61  bone]
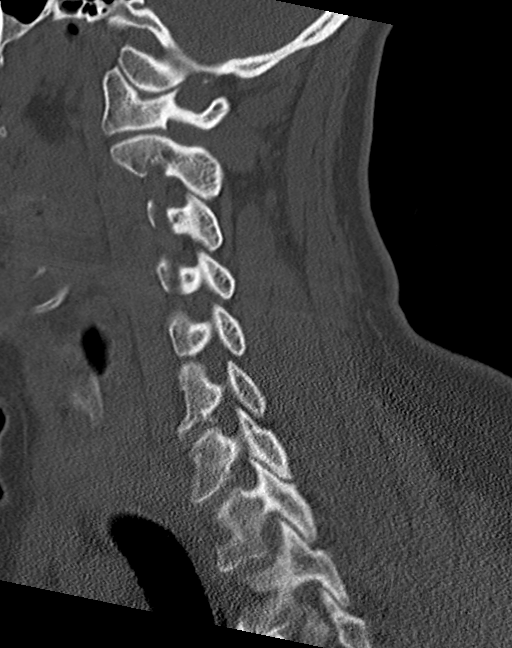
[im 26/61  bone]
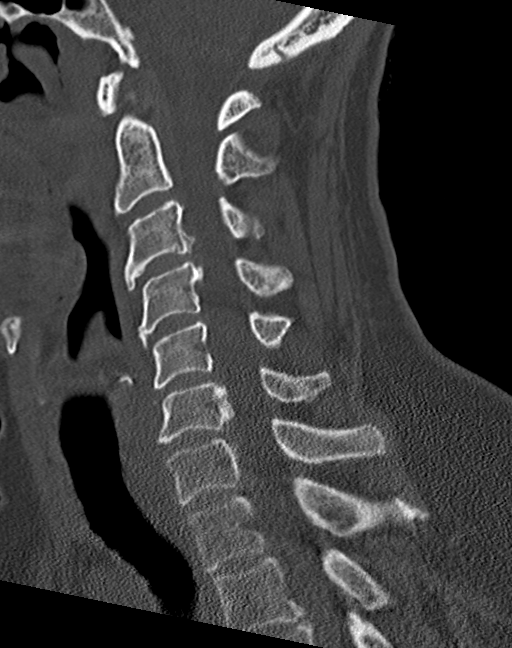
[im 31/61  soft-tissue]
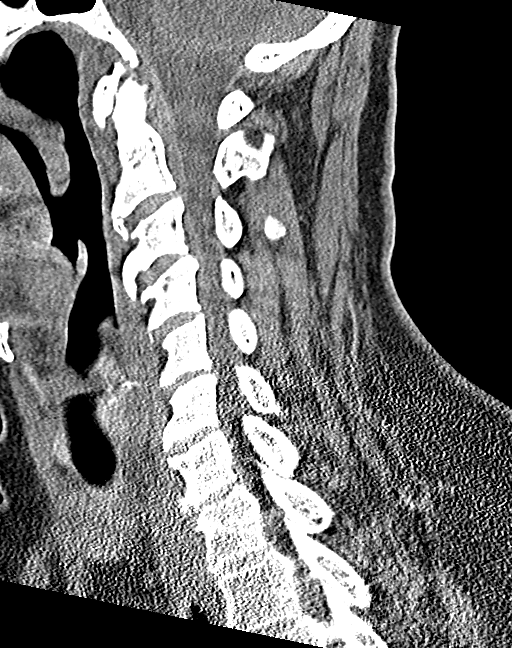
[im 31/61  bone]
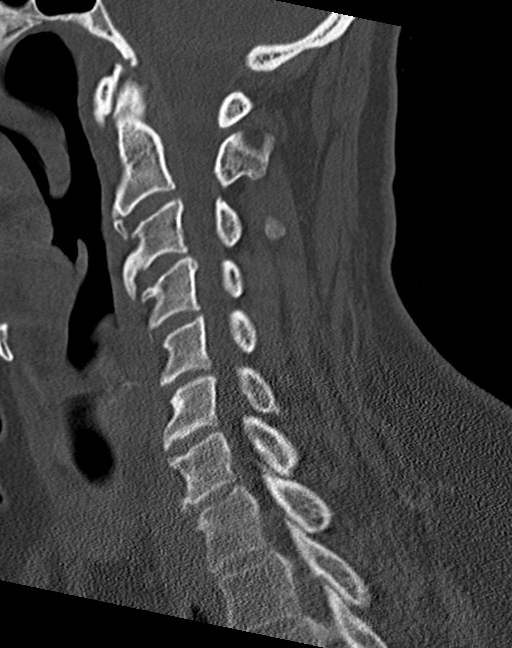
[im 36/61  bone]
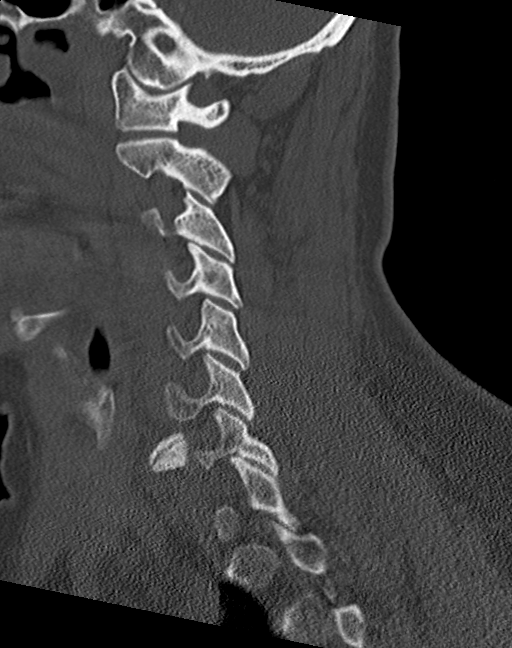
[im 41/61  bone]
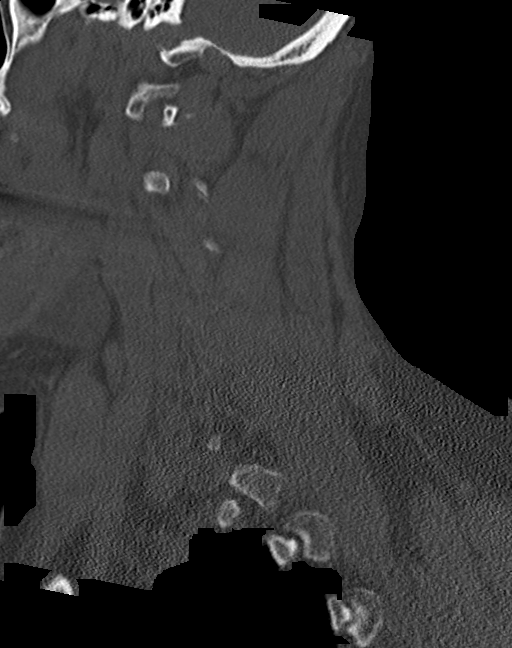

[Series 8: coronal bone · coronal · 0.25mm/px · 3 of 65 slices shown]
[im 13/65  bone]
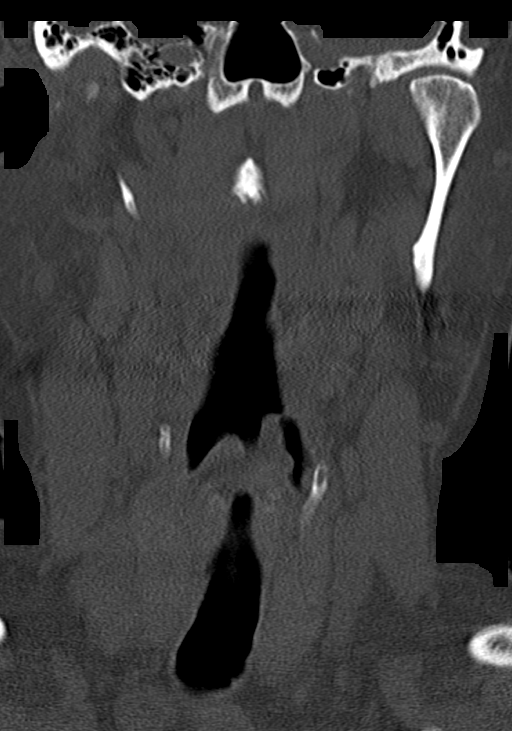
[im 26/65  bone]
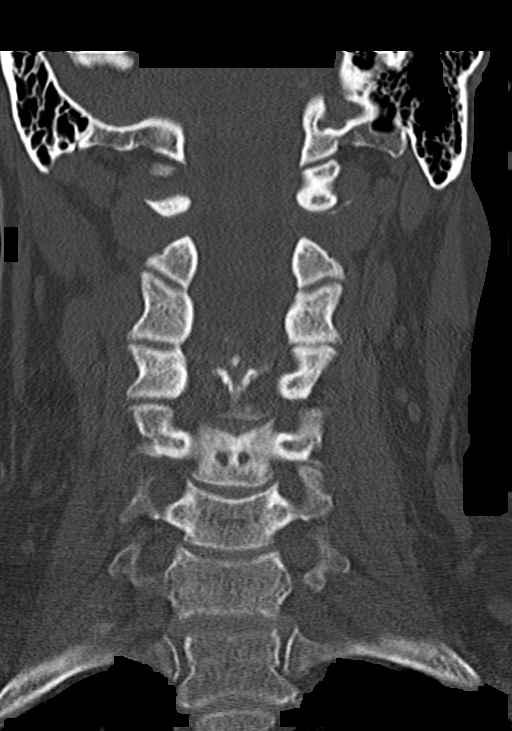
[im 39/65  bone]
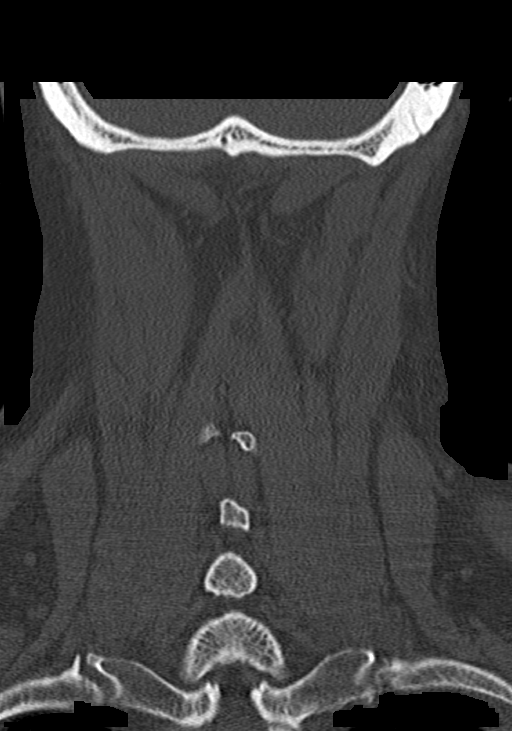

[Series 9: orthogonal bone · axial · 0.23mm/px · z∈[-303,-187]mm · 5 of 91 slices shown, 7 images]
[im 16/91  soft-tissue]
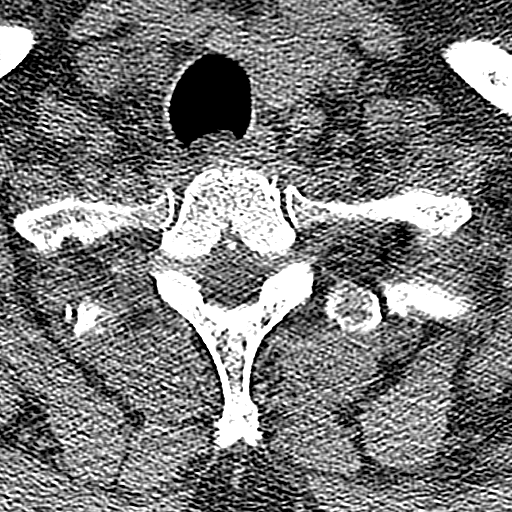
[im 16/91  bone]
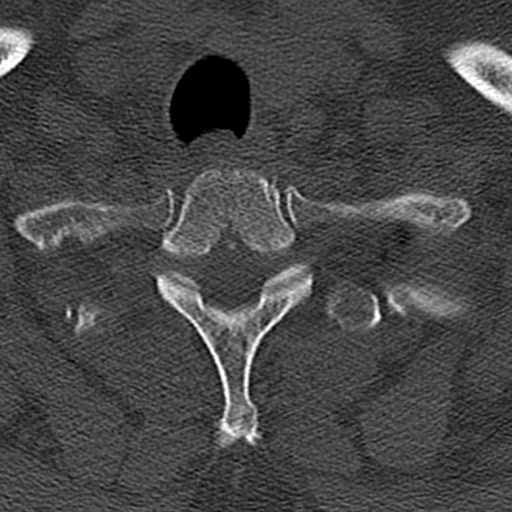
[im 31/91  bone]
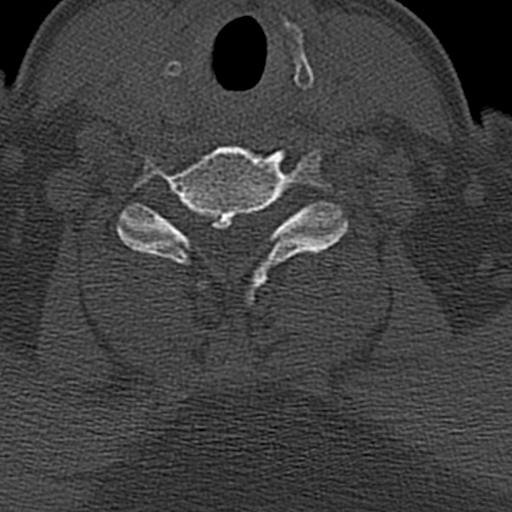
[im 46/91  bone]
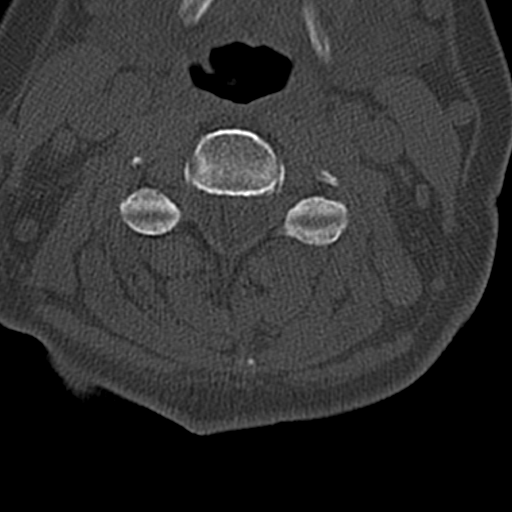
[im 61/91  bone]
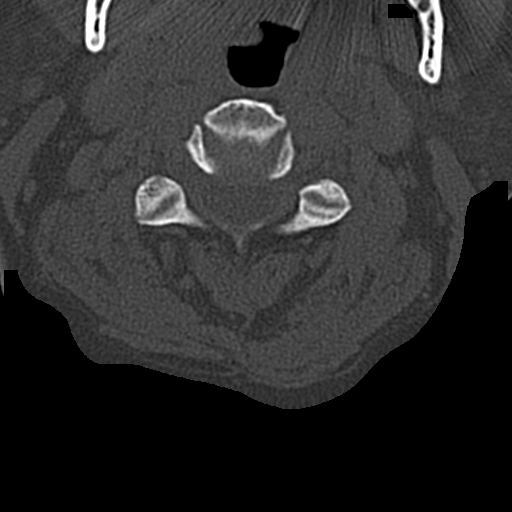
[im 76/91  soft-tissue]
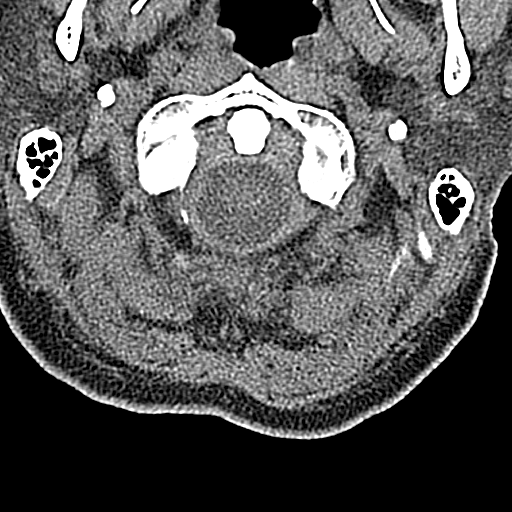
[im 76/91  bone]
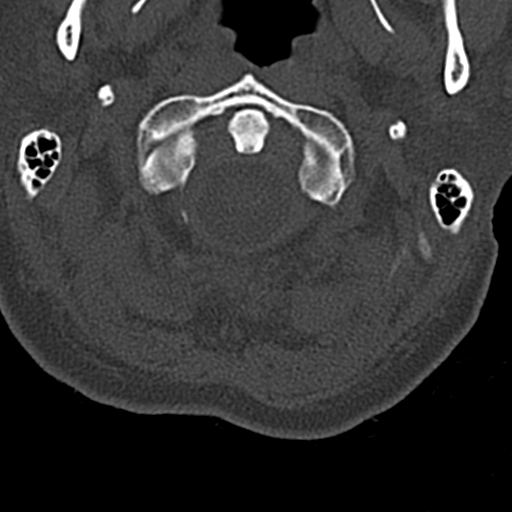

[13 of 33 positions shown; findings below may reference images not displayed]

FINDINGS: CT HEAD FINDINGS

Brain: Normal anatomic configuration. No abnormal intra or
extra-axial mass lesion or fluid collection. No abnormal mass effect
or midline shift. No evidence of acute intracranial hemorrhage or
infarct. Ventricular size is normal. Cerebellum unremarkable.

Vascular: Unremarkable

Skull: Intact

Sinuses/Orbits: Paranasal sinuses are clear. Orbits are
unremarkable.

Other: Mastoid air cells and middle ear cavities are clear.

CT CERVICAL SPINE FINDINGS

Alignment: Normal.

Skull base and vertebrae: No acute fracture. No primary bone lesion
or focal pathologic process.

Soft tissues and spinal canal: No prevertebral fluid or swelling. No
visible canal hematoma. Posterior disc osteophyte complex in
combination with congenital diffuse narrowing of the spinal canal
results in mild central canal stenosis at C3-4 with an AP canal
diameter of approximately 9 mm with abutment and subtle flattening
of the thecal sac at this level.

Disc levels: Vertebral body height has been preserved.
Intervertebral disc height has been preserved. There is mild
endplate remodeling at C3-C7 in keeping with changes of mild
degenerative disc disease. No significant facet arthrosis. No
significant neural foraminal narrowing.

Upper chest: Negative.

Other: None significant
IMPRESSION: No acute intracranial injury.  No calvarial fracture.

No acute cervical spine fracture.

Mild central canal stenosis at C3-4 secondary to small posterior
disc osteophyte complex in combination with diffuse congenital
narrowing of the spinal canal.

## 2021-01-22 ENCOUNTER — Ambulatory Visit (HOSPITAL_COMMUNITY)
Admission: RE | Admit: 2021-01-22 | Discharge: 2021-01-22 | Disposition: A | Discharge status: Home / Self Care | Payer: Medicare Other | Source: Ambulatory Visit | Attending: Family | Admitting: Family

## 2021-01-22 ENCOUNTER — Other Ambulatory Visit: Payer: Self-pay

## 2021-01-22 ENCOUNTER — Ambulatory Visit (INDEPENDENT_AMBULATORY_CARE_PROVIDER_SITE_OTHER): Payer: Medicare Other | Admitting: Family

## 2021-01-22 ENCOUNTER — Telehealth: Payer: Self-pay | Admitting: Family

## 2021-01-22 ENCOUNTER — Encounter: Payer: Self-pay | Admitting: Family

## 2021-01-22 VITALS — BP 130/94 | HR 73 | Temp 97.4°F | Ht 66.0 in | Wt 224.2 lb

## 2021-01-22 DIAGNOSIS — Z1231 Encounter for screening mammogram for malignant neoplasm of breast: Secondary | ICD-10-CM

## 2021-01-22 DIAGNOSIS — I10 Essential (primary) hypertension: Secondary | ICD-10-CM

## 2021-01-22 DIAGNOSIS — R079 Chest pain, unspecified: Secondary | ICD-10-CM

## 2021-01-22 DIAGNOSIS — Z86711 Personal history of pulmonary embolism: Secondary | ICD-10-CM

## 2021-01-22 LAB — CBC WITH DIFFERENTIAL/PLATELET
Basophils Absolute: 0 10*3/uL (ref 0.0–0.1)
Basophils Relative: 0.9 % (ref 0.0–3.0)
Eosinophils Absolute: 0 10*3/uL (ref 0.0–0.7)
Eosinophils Relative: 0 % (ref 0.0–5.0)
HCT: 39.3 % (ref 36.0–46.0)
Hemoglobin: 13.5 g/dL (ref 12.0–15.0)
Lymphocytes Relative: 43.7 % (ref 12.0–46.0)
Lymphs Abs: 1.7 10*3/uL (ref 0.7–4.0)
MCHC: 34.4 g/dL (ref 30.0–36.0)
MCV: 90.6 fl (ref 78.0–100.0)
Monocytes Absolute: 0.3 10*3/uL (ref 0.1–1.0)
Monocytes Relative: 8.9 % (ref 3.0–12.0)
Neutro Abs: 1.8 10*3/uL (ref 1.4–7.7)
Neutrophils Relative %: 46.5 % (ref 43.0–77.0)
Platelets: 289 10*3/uL (ref 150.0–400.0)
RBC: 4.34 Mil/uL (ref 3.87–5.11)
RDW: 13.4 % (ref 11.5–15.5)
WBC: 3.9 10*3/uL — ABNORMAL LOW (ref 4.0–10.5)

## 2021-01-22 LAB — COMPREHENSIVE METABOLIC PANEL
ALT: 31 U/L (ref 0–35)
AST: 30 U/L (ref 0–37)
Albumin: 4.2 g/dL (ref 3.5–5.2)
Alkaline Phosphatase: 73 U/L (ref 39–117)
BUN: 10 mg/dL (ref 6–23)
CO2: 28 mEq/L (ref 19–32)
Calcium: 9.9 mg/dL (ref 8.4–10.5)
Chloride: 104 mEq/L (ref 96–112)
Creatinine, Ser: 0.76 mg/dL (ref 0.40–1.20)
GFR: 83.37 mL/min (ref >60.00)
Glucose, Bld: 92 mg/dL (ref 70–99)
Potassium: 4.3 mEq/L (ref 3.5–5.1)
Sodium: 139 mEq/L (ref 135–145)
Total Bilirubin: 0.6 mg/dL (ref 0.2–1.2)
Total Protein: 7.7 g/dL (ref 6.0–8.3)

## 2021-01-22 LAB — POCT I-STAT CREATININE: Creatinine, Ser: 0.8 mg/dL (ref 0.44–1.00)

## 2021-01-22 MED ORDER — APIXABAN 5 MG PO TABS
5.0000 mg | ORAL_TABLET | Freq: Two times a day (BID) | ORAL | 1 refills | Status: DC
Start: 2021-01-22 — End: 2021-04-29

## 2021-01-22 MED ORDER — IOHEXOL 350 MG/ML SOLN
100.0000 mL | Freq: Once | INTRAVENOUS | Status: AC | PRN
  Administered 2021-01-22: 15:00:00 100 mL via INTRAVENOUS

## 2021-01-22 MED ORDER — AMLODIPINE BESYLATE 10 MG PO TABS: ORAL_TABLET | ORAL | 1 refills | Status: DC

## 2021-01-22 NOTE — Telephone Encounter (Signed)
Pt has an appointment in the office today. We will address her concern then.

## 2021-01-22 NOTE — Telephone Encounter (Signed)
Patient called stating she was having chest tightness and discomfort. Patient triaged.

## 2021-01-22 NOTE — Telephone Encounter (Signed)
I have called pt and informed her that she will have to contact her pulmonologist to see if they have samples or if they can offer another alternative since they are the ones that started that medication. Pt was clear that she wanted to have a PCP closer to home.

## 2021-01-22 NOTE — Telephone Encounter (Signed)
Patient states when she went to the pharmacy the price for her apixaban (ELIQUIS) 5 MG TABS tablet  medication was over 500 dollars. She would like to know what other medication to take.

## 2021-01-22 NOTE — Patient Instructions (Signed)
Please go ahead and get established with new PCP in [place] as we discussed.  I am going to put you back on your Eliquis and Amlodipine; refills have been sent.  I would like to update labs today and we will get a chest CT to make sure you have not developed another PE. I have also ordered your mammogram to [facility].

## 2021-01-22 NOTE — Progress Notes (Signed)
Isabel Garcia is a 65 y.o. female with the following history as recorded in [employer]:  Patient Active Problem List   Diagnosis Date Noted   S/P right TKA 06/07/2019   History of DVT (deep vein thrombosis) 04/26/2018   Current use of long term anticoagulation 04/26/2018   Chronic low back pain with right-sided sciatica 10/22/2017   Chronic pain of right knee 10/22/2017   Right foot pain 10/22/2017   Neck pain 10/22/2017   Obesity (BMI 35.0-39.9 without comorbidity) 10/22/2017   Healthcare maintenance 10/20/2017   History of pulmonary embolus (PE) 10/20/2017   GERD (gastroesophageal reflux disease) 09/08/2017   Cough 08/11/2017   Essential hypertension 08/11/2017   Obstructive sleep apnea 08/11/2017    Current Outpatient Medications  Medication Sig Dispense Refill   albuterol (VENTOLIN HFA) 108 (90 Base) MCG/ACT inhaler Inhale 2 puffs into the lungs every 6 (six) hours as needed for wheezing or shortness of breath.      azelastine (ASTELIN) 0.1 % nasal spray Place 1 spray into both nostrils 2 (two) times daily. Use in each nostril as directed 30 mL 12   cholecalciferol (VITAMIN D3) 25 MCG (1000 UNIT) tablet Take 1,000 Units by mouth daily.     COD LIVER OIL PO Take 1 capsule by mouth daily.     fluticasone (FLONASE) 50 MCG/ACT nasal spray Place 2 sprays into both nostrils daily. 16 g 11   Iron-Vitamin C (IRON 100/C) 100-250 MG TABS Take by mouth.     lidocaine (LIDODERM) 5 % Place 1 patch onto the skin daily. Remove & Discard patch within 12 hours or as directed by MD 30 patch 0   omeprazole (PRILOSEC) 40 MG capsule Take 1 capsule (40 mg total) by mouth daily. 90 capsule 1   Zinc Sulfate (ZINC 15 PO) Take 1 capsule by mouth daily.     amLODipine (NORVASC) 10 MG tablet TAKE 1 TABLET BY MOUTH ONCE DAILY FOR HIGH BLOOD PRESSURE (Patient not taking: Reported on 01/22/2021) 90 tablet 0   amLODipine (NORVASC) 10 MG tablet Take 1 tablet (10 mg total) daily for high blood pressure. 30 tablet 1    apixaban (ELIQUIS) 5 MG TABS tablet Take 1 tablet (5 mg total) by mouth 2 (two) times daily. 60 tablet 1   atorvastatin (LIPITOR) 20 MG tablet Take 1 tablet (20 mg total) by mouth daily. (Patient not taking: Reported on 01/22/2021) 90 tablet 0   celecoxib (CELEBREX) 200 MG capsule Take 200 mg by mouth daily. (Patient not taking: Reported on 01/22/2021)     Fluticasone-Salmeterol (ADVAIR DISKUS) 250-50 MCG/DOSE AEPB Inhale 1 puff into the lungs 2 (two) times daily. (Patient not taking: Reported on 01/22/2021) 60 each 5   montelukast (SINGULAIR) 10 MG tablet Take 1 tablet (10 mg total) by mouth at bedtime. (Patient not taking: Reported on 01/22/2021) 30 tablet 2   No current facility-administered medications for this visit.    Allergies: Patient has no known allergies.  Past Medical History:  Diagnosis Date   Achilles tendinitis    Arthritis    Asthma    Clotting disorder ([place])    Diverticulitis    Diverticulosis    GERD (gastroesophageal reflux disease)    Heart murmur    as a child   History of blood clots    History of chicken pox    History of pulmonary embolus (PE) 2018   Hyperlipidemia    Hypertension    Liver cirrhosis secondary to NASH (nonalcoholic steatohepatitis) ([place])  Osteoarthritis    Phlebitis    Plantar fasciitis    Sleep apnea    not wearing CPAP at this time   Vertigo     Past Surgical History:  Procedure Laterality Date   ABDOMINAL HYSTERECTOMY     bone spur removal     COLONOSCOPY     KNEE ARTHROSCOPY Right    KNEE ARTHROSCOPY Right    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   Procedure: TOTAL KNEE ARTHROPLASTY;  Surgeon: Paralee Cancel, MD;  Location: [facility];  Service: Orthopedics;  Laterality: Right;  70 mins    Family History  Problem Relation Age of Onset   Arthritis Mother    Hypertension Mother    Hypertension Father    Heart attack Father    Early death Father    Asthma Sister    Heart attack Brother    Early death Brother     Hypertension Brother    Stroke Maternal Grandmother    Heart attack Maternal Grandfather    Heart attack Paternal Grandfather    Early death Paternal Grandfather    Colon cancer Neg Hx    Stomach cancer Neg Hx    Pancreatic cancer Neg Hx    Esophageal cancer Neg Hx    Rectal cancer Neg Hx     Social History   Tobacco Use   Smoking status: Never   Smokeless tobacco: Never  Substance Use Topics   Alcohol use: Not Currently    Subjective:  Presents with concerns for chest pain; per patient, pain has been present x 1 week; no chest pain or shortness of breath on exertion; pain is more noticeable with certain movements; has been doing water aerobics and has recently increased her weight;  Has not been seen in almost 2 years; Readily admits that is not taking her blood pressure medication regularly- may take medication 1-2 x per week;   History of unprovoked PE- was told by hematology  in 2021 that should be on Eliquis for indefinite period; however, due to financial constraints, has been off Eliquis x 1 year;   Lives in [place] and wants to transfer her primary care to location closer to home.       Objective:  Vitals:   01/22/21 1102  BP: (!) 130/94  Pulse: 73  Temp: (!) 97.4 F (36.3 C)  TempSrc: Oral  SpO2: 98%  Weight: 224 lb 3.2 oz (101.7 kg)  Height: 5' 6"  (1.676 m)    General: Well developed, well nourished, in no acute distress  Skin : Warm and dry.  Head: Normocephalic and atraumatic  Eyes: Sclera and conjunctiva clear; pupils round and reactive to light; extraocular movements intact  Ears: External normal; canals clear; tympanic membranes normal  Oropharynx: Pink, supple. No suspicious lesions  Neck: Supple without thyromegaly, adenopathy  Lungs: Respirations unlabored; clear to auscultation bilaterally without wheeze, rales, rhonchi  CVS exam: normal rate and regular rhythm.  Extremities: No edema, cyanosis, clubbing  Vessels: Symmetric bilaterally   Neurologic: Alert and oriented; speech intact; face symmetrical; moves all extremities well; CNII-XII intact without focal deficit   Assessment:  1. Chest pain, unspecified type   2. History of pulmonary embolus (PE)   3. Primary hypertension   4. Essential hypertension   5. Visit for screening mammogram     Plan:  Suspect symptoms today are muscular in nature but concerning that she has been off DOAC almost 2 years with history of clotting disorder; EKG shows sinus rhythm; will update  Chest CT today; check CBC, CMP today;  Refills updated on Amlodipine and Eliquis;  Order updated for screening mammogram;  She will go ahead and get set up with new PCP in [place] and follow up here prn until she gets established with new provider.   This visit occurred during the SARS-CoV-2 public health emergency.  Safety protocols were in place, including screening questions prior to the visit, additional usage of staff PPE, and extensive cleaning of exam room while observing appropriate contact time as indicated for disinfecting solutions.    No follow-ups on file.  Orders Placed This Encounter  Procedures   MM Digital Screening    Standing Status:   Future    Standing Expiration Date:   01/22/2022    Order Specific Question:   Reason for Exam (SYMPTOM  OR DIAGNOSIS REQUIRED)    Answer:   screening mammogram    Order Specific Question:   Preferred imaging location?    Answer:   [facility]   CT Angio Chest W/Cm &/Or Wo Cm    Standing Status:   Future    Standing Expiration Date:   01/22/2022    Order Specific Question:   If indicated for the ordered procedure, I authorize the administration of contrast media per Radiology protocol    Answer:   Yes    Order Specific Question:   Preferred imaging location?    Answer:   [facility]    Order Specific Question:   Call Results- Best Contact Number?    Answer:   [phone]- pt can leave   CBC with Differential/Platelet   Comp  Met (CMET)   EKG 12-Lead    Requested Prescriptions   Signed Prescriptions Disp Refills   apixaban (ELIQUIS) 5 MG TABS tablet 60 tablet 1    Sig: Take 1 tablet (5 mg total) by mouth 2 (two) times daily.   amLODipine (NORVASC) 10 MG tablet 30 tablet 1    Sig: Take 1 tablet (10 mg total) daily for high blood pressure.

## 2021-01-22 NOTE — Telephone Encounter (Signed)
Nurse Assessment Nurse: Nicki Reaper, RN, Malachy Mood Date/Time (Eastern Time): 01/22/2021 8:49:00 AM Confirm and document reason for call. If symptomatic, describe symptoms. ---Caller states she is having chest discomfort on her L side that started yesterday, pain is constant, rates pain 4-5/10, has slight trouble breathing, pain is throbbing and going down the L side of breast with tightness, denies other symptoms, Hx of blood clots in lungs Does the patient have any new or worsening symptoms? ---Yes Will a triage be completed? ---Yes Related visit to physician within the last 2 weeks? ---No Does the PT have any chronic conditions? (i.e. diabetes, asthma, this includes High risk factors for pregnancy, etc.) ---Yes List chronic conditions. ---HTN Is this a behavioral health or substance abuse call? ---No Guidelines Guideline Title Affirmed Question Affirmed Notes Nurse Date/Time Eilene Ghazi Time) Chest Pain [1] Chest pain lasts > 5 minutes AND [2] age > 20 Nicki Reaper, RN, Malachy Mood 01/22/2021 8:52:22 AM Disp. Time Eilene Ghazi Time) Disposition Final User 01/22/2021 8:47:53 AM Send to Urgent Letta Kocher, Shanice PLEASE NOTE: All timestamps contained within this report are represented as [place] Standard Time. CONFIDENTIALTY NOTICE: This fax transmission is intended only for the addressee. It contains information that is legally privileged, confidential or otherwise protected from use or disclosure. If you are not the intended recipient, you are strictly prohibited from reviewing, disclosing, copying using or disseminating any of this information or taking any action in reliance on or regarding this information. If you have received this fax in error, please notify us immediately by telephone so that we can arrange for its return to [place]. Phone: [phone], Toll-Free: [phone], Fax: [phone] Page: 2 of 2 Call Id: [id number] 01/22/2021 8:57:19 AM Call EMS 58 Now Yes Nicki Reaper, RN, Erskine Speed  Disagree/Comply Disagree Caller Understands Yes PreDisposition Call Doctor Care Advice Given Per Guideline CARE ADVICE given per Chest Pain (Adult) guideline. CALL EMS 911 NOW: * Immediate medical attention is needed. You need to hang up and call 911 (or an ambulance). Comments User: Burna Sis, RN Date/Time Eilene Ghazi Time): 01/22/2021 9:01:49 AM Refused to call 911 and ER, called office spoke with [facility] and no appts today, instructed caller of no appts and that i recommend she call 911 and if she is not going to call 911 or go to ER , if she choose to go to [facility] that would be her choice as long as she understands I recommend 911, instructed per office she has not been seen in several years, pt requesting appt, transferred to office Referrals [place] REFUSED

## 2021-04-29 ENCOUNTER — Ambulatory Visit (INDEPENDENT_AMBULATORY_CARE_PROVIDER_SITE_OTHER): Payer: Medicare Other | Admitting: Internal Medicine

## 2021-04-29 ENCOUNTER — Other Ambulatory Visit: Payer: Self-pay

## 2021-04-29 ENCOUNTER — Encounter: Payer: Self-pay | Admitting: Internal Medicine

## 2021-04-29 VITALS — BP 136/86 | HR 76 | Ht 66.5 in | Wt 204.8 lb

## 2021-04-29 DIAGNOSIS — R7303 Prediabetes: Secondary | ICD-10-CM

## 2021-04-29 DIAGNOSIS — G4733 Obstructive sleep apnea (adult) (pediatric): Secondary | ICD-10-CM

## 2021-04-29 DIAGNOSIS — E559 Vitamin D deficiency, unspecified: Secondary | ICD-10-CM

## 2021-04-29 DIAGNOSIS — K219 Gastro-esophageal reflux disease without esophagitis: Secondary | ICD-10-CM

## 2021-04-29 DIAGNOSIS — Z7901 Long term (current) use of anticoagulants: Secondary | ICD-10-CM

## 2021-04-29 DIAGNOSIS — I10 Essential (primary) hypertension: Secondary | ICD-10-CM

## 2021-04-29 DIAGNOSIS — E669 Obesity, unspecified: Secondary | ICD-10-CM | POA: Diagnosis not present

## 2021-04-29 DIAGNOSIS — Z86711 Personal history of pulmonary embolism: Secondary | ICD-10-CM

## 2021-04-29 DIAGNOSIS — Z1159 Encounter for screening for other viral diseases: Secondary | ICD-10-CM

## 2021-04-29 DIAGNOSIS — Z1231 Encounter for screening mammogram for malignant neoplasm of breast: Secondary | ICD-10-CM

## 2021-04-29 DIAGNOSIS — L219 Seborrheic dermatitis, unspecified: Secondary | ICD-10-CM

## 2021-04-29 DIAGNOSIS — E782 Mixed hyperlipidemia: Secondary | ICD-10-CM

## 2021-04-29 MED ORDER — AMLODIPINE BESYLATE 10 MG PO TABS: 10.0000 mg | ORAL_TABLET | Freq: Every day | ORAL | 5 refills | Status: DC

## 2021-04-29 MED ORDER — APIXABAN 5 MG PO TABS: 5.0000 mg | ORAL_TABLET | Freq: Two times a day (BID) | ORAL | 5 refills | Status: DC

## 2021-04-29 NOTE — Progress Notes (Signed)
? ?New Patient Office Visit ? ?Subjective:  ?Patient ID: Isabel Garcia, female    DOB: Nov 26, 1955  Age: 66 y.o. MRN: [id number] ? ?CC:  ?Chief Complaint  ?Patient presents with  ? New Patient (Initial Visit)  ?  Pt would like to have blood work.   ? ? ?HPI ?Isabel Garcia is a 66 y.o. female with past medical history of HTN, OSA, GERD, PE/DVT and obesity who presents for establishing care. ? ?HTN: BP is well-controlled. Takes medications regularly. Patient denies headache, dizziness, chest pain, dyspnea or palpitations. ? ?OSA: She has not been using her CPAP regularly, as she has difficulty breathing through the mask.  Of note, she has lost about 25 lbs with diet modification and exercises recently. ? 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  She is supposed to be on indefinite AC, and was put on Eliquis.  Chart review suggest that she has been noncompliant to it.  She states that she had orthopedic surgery/cast placement of LE, and was replaced few days before she was diagnosed with DVT/PE.  She has not had any recurrent episode of DVT or PE since 2020. ? ?She takes omeprazole as needed for GERD, but has required it recently.  She denies any dysphagia or odynophagia currently. ? ?She complains of patchy hair loss in her scalp.  Denies any whitish flakes.  She does report mild itching in her scalp.  She uses Amla hair oil.  She also takes vitamin B complex and zinc supplements. ? ?She is up-to-date with COVID and Shingrix vaccines. ? ? ? ? ?Past Medical History:  ?Diagnosis Date  ? Achilles tendinitis   ? Arthritis   ? Asthma   ? Clotting disorder ([place])   ? Diverticulitis   ? Diverticulosis   ? GERD (gastroesophageal reflux disease)   ? Heart murmur   ? as a child  ? History of blood clots   ? History of chicken pox   ? History of pulmonary embolus (PE) 2018  ? Hyperlipidemia   ? Hypertension   ? Liver cirrhosis secondary to NASH (nonalcoholic steatohepatitis) ([place])   ?  Osteoarthritis   ? Phlebitis   ? Plantar fasciitis   ? Sleep apnea   ? not wearing CPAP at this time  ? Vertigo   ? ? ?Past Surgical History:  ?Procedure Laterality Date  ? ABDOMINAL HYSTERECTOMY    ? bone spur removal    ? COLONOSCOPY    ? KNEE ARTHROSCOPY Right   ? KNEE ARTHROSCOPY Right   ? 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  ? Procedure: TOTAL KNEE ARTHROPLASTY;  Surgeon: Paralee Cancel, MD;  Location: [facility];  Service: Orthopedics;  Laterality: Right;  70 mins  ? ? ?Family History  ?Problem Relation Age of Onset  ? Arthritis Mother   ? Hypertension Mother   ? Hypertension Father   ? Heart attack Father   ? Early death Father   ? Asthma Sister   ? Heart attack Brother   ? Early death Brother   ? Hypertension Brother   ? Stroke Maternal Grandmother   ? Heart attack Maternal Grandfather   ? Heart attack Paternal Grandfather   ? Early death Paternal Grandfather   ? Colon cancer Neg Hx   ? Stomach cancer Neg Hx   ? Pancreatic cancer Neg Hx   ? Esophageal cancer Neg Hx   ? Rectal cancer Neg Hx   ? ? ?Social History  ? ?  Socioeconomic History  ? Marital status: Divorced  ?  Spouse name: Not on file  ? Number of children: 1  ? Years of education: Not on file  ? Highest education level: Not on file  ?Occupational History  ? Occupation: Disabled  ?Tobacco Use  ? Smoking status: Never  ? Smokeless tobacco: Never  ?Vaping Use  ? Vaping Use: Never used  ?Substance and Sexual Activity  ? Alcohol use: Not Currently  ? Drug use: No  ? Sexual activity: Not Currently  ?Other Topics Concern  ? Not on file  ?Social History Narrative  ? Not on file  ? ?Social Determinants of Health  ? ?Financial Resource Strain: Not on file  ?Food Insecurity: Not on file  ?Transportation Needs: Not on file  ?Physical Activity: Not on file  ?Stress: Not on file  ?Social Connections: Not on file  ?Intimate Partner Violence: Not on file  ? ? ?ROS ?Review of Systems  ?Constitutional:  Negative for chills and fever.  ?HENT:  Negative for congestion,  sinus pressure, sinus pain and sore throat.   ?Eyes:  Negative for pain and discharge.  ?Respiratory:  Negative for cough and shortness of breath.   ?Cardiovascular:  Negative for chest pain and palpitations.  ?Gastrointestinal:  Negative for abdominal pain, constipation, diarrhea, nausea and vomiting.  ?Endocrine: Negative for polydipsia and polyuria.  ?Genitourinary:  Negative for dysuria and hematuria.  ?Musculoskeletal:  Negative for neck pain and neck stiffness.  ?Skin:  Negative for rash.  ?Neurological:  Negative for dizziness and weakness.  ?Psychiatric/Behavioral:  Negative for agitation and behavioral problems.   ? ?Objective:  ? ?Today's Vitals: BP 136/86 (BP Location: Left Arm, Patient Position: Sitting)   Pulse 76   Ht 5' 6.5" (1.689 m)   Wt 204 lb 12.8 oz (92.9 kg)   SpO2 98%   BMI 32.56 kg/m?  ? ?Physical Exam ?Vitals reviewed.  ?Constitutional:   ?   General: She is not in acute distress. ?   Appearance: She is not diaphoretic.  ?HENT:  ?   Head: Normocephalic and atraumatic.  ?   Nose: Nose normal.  ?   Mouth/Throat:  ?   Mouth: Mucous membranes are moist.  ?Eyes:  ?   General: No scleral icterus. ?   Extraocular Movements: Extraocular movements intact.  ?Cardiovascular:  ?   Rate and Rhythm: Normal rate and regular rhythm.  ?   Pulses: Normal pulses.  ?   Heart sounds: Normal heart sounds. No murmur heard. ?Pulmonary:  ?   Breath sounds: Normal breath sounds. No wheezing or rales.  ?Musculoskeletal:  ?   Cervical back: Neck supple. No tenderness.  ?   Right lower leg: No edema.  ?   Left lower leg: No edema.  ?Skin: ?   General: Skin is warm.  ?   Findings: No rash.  ?   Comments: Patchy hair loss  ?Neurological:  ?   General: No focal deficit present.  ?   Mental Status: She is alert and oriented to person, place, and time.  ?   Sensory: No sensory deficit.  ?   Motor: No weakness.  ?Psychiatric:     ?   Mood and Affect: Mood normal.     ?   Behavior: Behavior normal.  ? ? ?Assessment & Plan:   ? ?Problem List Items Addressed This Visit   ? ?  ? Cardiovascular and Mediastinum  ? Essential hypertension - Primary  ?  BP Readings from  Last 1 Encounters:  ?04/29/21 136/86  ?Well-controlled with amlodipine, chart review suggests noncompliance to treatment in the past ?Counseled for compliance with the medications ?Advised DASH diet and moderate exercise/walking, at least 150 mins/week ?  ?  ? Relevant Medications  ? amLODipine (NORVASC) 10 MG tablet  ? apixaban (ELIQUIS) 5 MG TABS tablet  ? Other Relevant Orders  ? TSH (Completed)  ? CMP14+EGFR (Completed)  ? CBC with Differential/Platelet (Completed)  ?  ? Respiratory  ? Obstructive sleep apnea  ?  Does not use CPAP regularly, advised to be compliant with CPAP use ?  ?  ?  ? Digestive  ? GERD (gastroesophageal reflux disease)  ?  Takes omeprazole as needed ?  ?  ?  ? Musculoskeletal and Integument  ? Seborrheic dermatitis  ?  Advised to use OTC Nizoral shampoo ?And advised to take hair, nail and skin multivitamin ?  ?  ?  ? Other  ? History of pulmonary embolus (PE)  ?  Chart review suggests history of unprovoked PE/DVT in the past ?Supposed to be on indefinite AC, on Eliquis ?Referred to heme-onc for further discussion ?  ?  ? Relevant Medications  ? apixaban (ELIQUIS) 5 MG TABS tablet  ? Other Relevant Orders  ? Ambulatory referral to Hematology / Oncology  ? Obesity (BMI 35.0-39.9 without comorbidity)  ?  Advised to follow low-carb diet and continue moderate exercise/walking as tolerated ?Performs aerobic exercises and muscle strengthening exercises ?  ?  ? Relevant Orders  ? TSH (Completed)  ? Current use of long term anticoagulation  ? Relevant Orders  ? CBC with Differential/Platelet (Completed)  ? ?Other Visit Diagnoses   ? ? Prediabetes      ? Relevant Orders  ? Hemoglobin A1c (Completed)  ? Mixed hyperlipidemia      ? Relevant Medications  ? amLODipine (NORVASC) 10 MG tablet  ? apixaban (ELIQUIS) 5 MG TABS tablet  ? Other Relevant Orders  ? Lipid  panel (Completed)  ? Vitamin D deficiency      ? Relevant Orders  ? VITAMIN D 25 Hydroxy (Vit-D Deficiency, Fractures) (Completed)  ? Need for hepatitis C screening test      ? Relevant Orders  ? Hepatitis C A

## 2021-04-29 NOTE — Assessment & Plan Note (Signed)
Advised to use OTC Nizoral shampoo ?And advised to take hair, nail and skin multivitamin ?

## 2021-04-29 NOTE — Patient Instructions (Addendum)
Please continue taking medications as prescribed. ? ?Please use Nizoral shampoo for patchy hair loss. ? ?Please start taking Hair, skin and nail supplement. ?

## 2021-04-29 NOTE — Assessment & Plan Note (Signed)
BP Readings from Last 1 Encounters:  ?04/29/21 136/86  ? ?Well-controlled with amlodipine, chart review suggests noncompliance to treatment in the past ?Counseled for compliance with the medications ?Advised DASH diet and moderate exercise/walking, at least 150 mins/week ?

## 2021-04-29 NOTE — Assessment & Plan Note (Signed)
Chart review suggests history of unprovoked PE/DVT in the past ?Supposed to be on indefinite AC, on Eliquis ?Referred to heme-onc for further discussion ?

## 2021-04-29 NOTE — Assessment & Plan Note (Signed)
Takes omeprazole as needed ?

## 2021-04-29 NOTE — Assessment & Plan Note (Signed)
Advised to follow low-carb diet and continue moderate exercise/walking as tolerated ?Performs aerobic exercises and muscle strengthening exercises ?

## 2021-04-29 NOTE — Assessment & Plan Note (Signed)
Does not use CPAP regularly, advised to be compliant with CPAP use ?

## 2021-04-30 LAB — CMP14+EGFR
ALT: 27 IU/L (ref 0–32)
AST: 25 IU/L (ref 0–40)
Albumin/Globulin Ratio: 1.4 (ref 1.2–2.2)
Albumin: 4.3 g/dL (ref 3.8–4.8)
Alkaline Phosphatase: 83 IU/L (ref 44–121)
BUN/Creatinine Ratio: 12 (ref 12–28)
BUN: 8 mg/dL (ref 8–27)
Bilirubin Total: 0.5 mg/dL (ref 0.0–1.2)
CO2: 26 mmol/L (ref 20–29)
Calcium: 9.6 mg/dL (ref 8.7–10.3)
Chloride: 104 mmol/L (ref 96–106)
Creatinine, Ser: 0.65 mg/dL (ref 0.57–1.00)
Globulin, Total: 3 g/dL (ref 1.5–4.5)
Glucose: 78 mg/dL (ref 70–99)
Potassium: 4.2 mmol/L (ref 3.5–5.2)
Sodium: 142 mmol/L (ref 134–144)
Total Protein: 7.3 g/dL (ref 6.0–8.5)
eGFR: 99 mL/min/{1.73_m2} (ref >59)

## 2021-04-30 LAB — CBC WITH DIFFERENTIAL/PLATELET
Basophils Absolute: 0 10*3/uL (ref 0.0–0.2)
Basos: 1 %
EOS (ABSOLUTE): 0 10*3/uL (ref 0.0–0.4)
Eos: 1 %
Hematocrit: 39.7 % (ref 34.0–46.6)
Hemoglobin: 13.1 g/dL (ref 11.1–15.9)
Immature Grans (Abs): 0 10*3/uL (ref 0.0–0.1)
Immature Granulocytes: 0 %
Lymphocytes Absolute: 2.7 10*3/uL (ref 0.7–3.1)
Lymphs: 56 %
MCH: 30.4 pg (ref 26.6–33.0)
MCHC: 33 g/dL (ref 31.5–35.7)
MCV: 92 fL (ref 79–97)
Monocytes Absolute: 0.4 10*3/uL (ref 0.1–0.9)
Monocytes: 8 %
Neutrophils Absolute: 1.6 10*3/uL (ref 1.4–7.0)
Neutrophils: 34 %
Platelets: 272 10*3/uL (ref 150–450)
RBC: 4.31 x10E6/uL (ref 3.77–5.28)
RDW: 13.3 % (ref 11.7–15.4)
WBC: 4.9 10*3/uL (ref 3.4–10.8)

## 2021-04-30 LAB — LIPID PANEL
Chol/HDL Ratio: 3.7 ratio (ref 0.0–4.4)
Cholesterol, Total: 192 mg/dL (ref 100–199)
HDL: 52 mg/dL (ref >39)
LDL Chol Calc (NIH): 126 mg/dL — ABNORMAL HIGH (ref 0–99)
Triglycerides: 78 mg/dL (ref 0–149)
VLDL Cholesterol Cal: 14 mg/dL (ref 5–40)

## 2021-04-30 LAB — HEMOGLOBIN A1C
Est. average glucose Bld gHb Est-mCnc: 108 mg/dL
Hgb A1c MFr Bld: 5.4 % (ref 4.8–5.6)

## 2021-04-30 LAB — TSH: TSH: 0.704 u[IU]/mL (ref 0.450–4.500)

## 2021-04-30 LAB — VITAMIN D 25 HYDROXY (VIT D DEFICIENCY, FRACTURES): Vit D, 25-Hydroxy: 40.3 ng/mL (ref 30.0–100.0)

## 2021-04-30 LAB — HEPATITIS C ANTIBODY: Hep C Virus Ab: NONREACTIVE

## 2021-05-08 ENCOUNTER — Other Ambulatory Visit (HOSPITAL_COMMUNITY): Payer: Medicare Other

## 2021-05-15 ENCOUNTER — Ambulatory Visit (HOSPITAL_COMMUNITY): Payer: Medicare Other | Admitting: Physician Assistant

## 2021-05-30 ENCOUNTER — Other Ambulatory Visit (HOSPITAL_COMMUNITY): Payer: Self-pay

## 2021-05-30 DIAGNOSIS — Z86711 Personal history of pulmonary embolism: Secondary | ICD-10-CM

## 2021-06-03 ENCOUNTER — Ambulatory Visit (HOSPITAL_COMMUNITY)
Admission: RE | Admit: 2021-06-03 | Discharge: 2021-06-03 | Disposition: A | Discharge status: Home / Self Care | Payer: Medicare Other | Source: Ambulatory Visit | Attending: Internal Medicine | Admitting: Internal Medicine

## 2021-06-03 ENCOUNTER — Ambulatory Visit (INDEPENDENT_AMBULATORY_CARE_PROVIDER_SITE_OTHER): Payer: Medicare Other

## 2021-06-03 ENCOUNTER — Inpatient Hospital Stay (HOSPITAL_COMMUNITY): Payer: Medicare Other | Attending: Hematology

## 2021-06-03 DIAGNOSIS — Z86718 Personal history of other venous thrombosis and embolism: Secondary | ICD-10-CM | POA: Diagnosis present

## 2021-06-03 DIAGNOSIS — Z1231 Encounter for screening mammogram for malignant neoplasm of breast: Secondary | ICD-10-CM | POA: Insufficient documentation

## 2021-06-03 DIAGNOSIS — Z0000 Encounter for general adult medical examination without abnormal findings: Secondary | ICD-10-CM

## 2021-06-03 DIAGNOSIS — Z7901 Long term (current) use of anticoagulants: Secondary | ICD-10-CM | POA: Insufficient documentation

## 2021-06-03 DIAGNOSIS — Z86711 Personal history of pulmonary embolism: Secondary | ICD-10-CM | POA: Diagnosis not present

## 2021-06-03 LAB — D-DIMER, QUANTITATIVE: D-Dimer, Quant: 0.42 ug/mL-FEU (ref 0.00–0.50)

## 2021-06-03 NOTE — Patient Instructions (Addendum)
?[name] , ?Thank you for taking time to come for your Medicare Wellness Visit. I appreciate your ongoing commitment to your health goals. Please review the following plan we discussed and let me know if I can assist you in the future.  ? ?These are the goals we discussed: ? Goals   ? ?  DIET - EAT MORE FRUITS AND VEGETABLES   ?  Patient Stated   ?  Stay as active as you are! ?  ?  Patient Stated   ?  Continue to try to lose weight  ?  ? ?  ?  ?This is a list of the screening recommended for you and due dates:  ?Health Maintenance  ?Topic Date Due  ? Urine Protein Check  Never done  ? Zoster (Shingles) Vaccine (2 of 2) 06/07/2019  ? COVID-19 Vaccine (5 - Booster for Moderna series) 03/09/2021  ? Mammogram  04/14/2021  ? Flu Shot  09/03/2021  ? Tetanus Vaccine  09/05/2024  ? Colon Cancer Screening  09/27/2029  ? Hepatitis C Screening: USPSTF Recommendation to screen - Ages 45-79 yo.  Completed  ? HIV Screening  Completed  ? HPV Vaccine  Aged Out  ? Pap Smear  Discontinued  ?  ? ?Health Maintenance, Female ?Adopting a healthy lifestyle and getting preventive care are important in promoting health and wellness. Ask your health care provider about: ?The right schedule for you to have regular tests and exams. ?Things you can do on your own to prevent diseases and keep yourself healthy. ?What should I know about diet, weight, and exercise? ?Eat a healthy diet ? ?Eat a diet that includes plenty of vegetables, fruits, low-fat dairy products, and lean protein. ?Do not eat a lot of foods that are high in solid fats, added sugars, or sodium. ?Maintain a healthy weight ?Body mass index (BMI) is used to identify weight problems. It estimates body fat based on height and weight. Your health care provider can help determine your BMI and help you achieve or maintain a healthy weight. ?Get regular exercise ?Get regular exercise. This is one of the most important things you can do for your health. Most adults should: ?Exercise for  at least 150 minutes each week. The exercise should increase your heart rate and make you sweat (moderate-intensity exercise). ?Do strengthening exercises at least twice a week. This is in addition to the moderate-intensity exercise. ?Spend less time sitting. Even light physical activity can be beneficial. ?Watch cholesterol and blood lipids ?Have your blood tested for lipids and cholesterol at 64 years of age, then have this test every 5 years. ?Have your cholesterol levels checked more often if: ?Your lipid or cholesterol levels are high. ?You are older than 64 years of age. ?You are at high risk for heart disease. ?What should I know about cancer screening? ?Depending on your health history and family history, you may need to have cancer screening at various ages. This may include screening for: ?Breast cancer. ?Cervical cancer. ?Colorectal cancer. ?Skin cancer. ?Lung cancer. ?What should I know about heart disease, diabetes, and high blood pressure? ?Blood pressure and heart disease ?High blood pressure causes heart disease and increases the risk of stroke. This is more likely to develop in people who have high blood pressure readings or are overweight. ?Have your blood pressure checked: ?Every 3-5 years if you are 40-49 years of age. ?Every year if you are 85 years old or older. ?Diabetes ?Have regular diabetes screenings. This checks your fasting blood  sugar level. Have the screening done: ?Once every three years after age 6 if you are at a normal weight and have a low risk for diabetes. ?More often and at a younger age if you are overweight or have a high risk for diabetes. ?What should I know about preventing infection? ?Hepatitis B ?If you have a higher risk for hepatitis B, you should be screened for this virus. Talk with your health care provider to find out if you are at risk for hepatitis B infection. ?Hepatitis C ?Testing is recommended for: ?Everyone born from [birth year] through 1963. ?Anyone with known  risk factors for hepatitis C. ?Sexually transmitted infections (STIs) ?Get screened for STIs, including gonorrhea and chlamydia, if: ?You are sexually active and are younger than 65 years of age. ?You are older than 64 years of age and your health care provider tells you that you are at risk for this type of infection. ?Your sexual activity has changed since you were last screened, and you are at increased risk for chlamydia or gonorrhea. Ask your health care provider if you are at risk. ?Ask your health care provider about whether you are at high risk for HIV. Your health care provider may recommend a prescription medicine to help prevent HIV infection. If you choose to take medicine to prevent HIV, you should first get tested for HIV. You should then be tested every 3 months for as long as you are taking the medicine. ?Pregnancy ?If you are about to stop having your period (premenopausal) and you may become pregnant, seek counseling before you get pregnant. ?Take 400 to 800 micrograms (mcg) of folic acid every day if you become pregnant. ?Ask for birth control (contraception) if you want to prevent pregnancy. ?Osteoporosis and menopause ?Osteoporosis is a disease in which the bones lose minerals and strength with aging. This can result in bone fractures. If you are 5 years old or older, or if you are at risk for osteoporosis and fractures, ask your health care provider if you should: ?Be screened for bone loss. ?Take a calcium or vitamin D supplement to lower your risk of fractures. ?Be given hormone replacement therapy (HRT) to treat symptoms of menopause. ?Follow these instructions at home: ?Alcohol use ?Do not drink alcohol if: ?Your health care provider tells you not to drink. ?You are pregnant, may be pregnant, or are planning to become pregnant. ?If you drink alcohol: ?Limit how much you have to: ?0-1 drink a day. ?Know how much alcohol is in your drink. In the [place] one drink equals one 12 oz bottle of  beer (355 mL), one 5 oz glass of wine (148 mL), or one 1? oz glass of hard liquor (44 mL). ?Lifestyle ?Do not use any products that contain nicotine or tobacco. These products include cigarettes, chewing tobacco, and vaping devices, such as e-cigarettes. If you need help quitting, ask your health care provider. ?Do not use street drugs. ?Do not share needles. ?Ask your health care provider for help if you need support or information about quitting drugs. ?General instructions ?Schedule regular health, dental, and eye exams. ?Stay current with your vaccines. ?Tell your health care provider if: ?You often feel depressed. ?You have ever been abused or do not feel safe at home. ?Summary ?Adopting a healthy lifestyle and getting preventive care are important in promoting health and wellness. ?Follow your health care provider's instructions about healthy diet, exercising, and getting tested or screened for diseases. ?Follow your health care provider's instructions on monitoring your  cholesterol and blood pressure. ?This information is not intended to replace advice given to you by your health care provider. Make sure you discuss any questions you have with your health care provider. ?Document Revised: 06/11/2020 Document Reviewed: 06/11/2020 ?Elsevier Patient Education ? [place]. ? ?

## 2021-06-03 NOTE — Progress Notes (Signed)
? ?I connected with  Corrie Mckusick on 06/03/21 by a audio enabled telemedicine application and verified that I am speaking with the correct person using two identifiers. ? ?Patient Location: Home ? ?Provider Location: Home Office ? ?I discussed the limitations of evaluation and management by telemedicine. The patient expressed understanding and agreed to proceed.  ? ?Subjective:  ? Isabel Garcia is a 66 y.o. female who presents for an Initial Medicare Annual Wellness Visit. ? ?Review of Systems    ?Cardiac Risk Factors include: hypertension ? ?   ?Objective:  ?  ?Today's Vitals  ? 06/03/21 1336  ?PainSc: 5   ? ?There is no height or weight on file to calculate BMI. ? ? ?  06/03/2021  ?  1:35 PM 10/18/2019  ?  9:15 AM 09/13/2019  ? 10:47 AM 06/13/2019  ? 10:26 AM 06/07/2019  ?  5:00 PM 06/02/2019  ?  8:56 AM 05/10/2019  ?  2:48 PM  ?Advanced Directives  ?Does Patient Have a Medical Advance Directive? No No No No No No No  ?Does patient want to make changes to medical advance directive?       No - Patient declined  ?Would patient like information on creating a medical advance directive? No - Patient declined Yes (MAU/Ambulatory/Procedural Areas - Information given)  No - Patient declined Yes (MAU/Ambulatory/Procedural Areas - Information given) Yes (MAU/Ambulatory/Procedural Areas - Information given) No - Patient declined  ? ? ?Current Medications (verified) ?Outpatient Encounter Medications as of 06/03/2021  ?Medication Sig  ? albuterol (VENTOLIN HFA) 108 (90 Base) MCG/ACT inhaler Inhale 2 puffs into the lungs every 6 (six) hours as needed for wheezing or shortness of breath.   ? amLODipine (NORVASC) 10 MG tablet Take 1 tablet (10 mg total) by mouth daily. Take 1 tablet (10 mg total) daily for high blood pressure.  ? apixaban (ELIQUIS) 5 MG TABS tablet Take 1 tablet (5 mg total) by mouth 2 (two) times daily.  ? cholecalciferol (VITAMIN D3) 25 MCG (1000 UNIT) tablet Take 1,000 Units by mouth daily.  ? COD LIVER OIL PO Take 1  capsule by mouth daily.  ? fluticasone (FLONASE) 50 MCG/ACT nasal spray Place 2 sprays into both nostrils daily.  ? Fluticasone-Salmeterol (ADVAIR DISKUS) 250-50 MCG/DOSE AEPB Inhale 1 puff into the lungs 2 (two) times daily.  ? Iron-Vitamin C (IRON 100/C) 100-250 MG TABS Take by mouth.  ? lidocaine (LIDODERM) 5 % Place 1 patch onto the skin daily. Remove & Discard patch within 12 hours or as directed by MD  ? omeprazole (PRILOSEC) 40 MG capsule Take 1 capsule (40 mg total) by mouth daily.  ? Zinc Sulfate (ZINC 15 PO) Take 1 capsule by mouth daily.  ? ?No facility-administered encounter medications on file as of 06/03/2021.  ? ? ?Allergies (verified) ?Patient has no known allergies.  ? ?History: ?Past Medical History:  ?Diagnosis Date  ? Achilles tendinitis   ? Arthritis   ? Asthma   ? Clotting disorder ([place])   ? Diverticulitis   ? Diverticulosis   ? GERD (gastroesophageal reflux disease)   ? Heart murmur   ? as a child  ? History of blood clots   ? History of chicken pox   ? History of pulmonary embolus (PE) 2018  ? Hyperlipidemia   ? Hypertension   ? Liver cirrhosis secondary to NASH (nonalcoholic steatohepatitis) ([place])   ? Osteoarthritis   ? Phlebitis   ? Plantar fasciitis   ? Sleep apnea   ?  not wearing CPAP at this time  ? Vertigo   ? ?Past Surgical History:  ?Procedure Laterality Date  ? ABDOMINAL HYSTERECTOMY    ? bone spur removal    ? COLONOSCOPY    ? KNEE ARTHROSCOPY Right   ? KNEE ARTHROSCOPY Right   ? 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  ? Procedure: TOTAL KNEE ARTHROPLASTY;  Surgeon: Paralee Cancel, MD;  Location: [facility];  Service: Orthopedics;  Laterality: Right;  70 mins  ? ?Family History  ?Problem Relation Age of Onset  ? Arthritis Mother   ? Hypertension Mother   ? Hypertension Father   ? Heart attack Father   ? Early death Father   ? Asthma Sister   ? Heart attack Brother   ? Early death Brother   ? Hypertension Brother   ? Stroke Maternal Grandmother   ? Heart attack Maternal Grandfather   ?  Heart attack Paternal Grandfather   ? Early death Paternal Grandfather   ? Colon cancer Neg Hx   ? Stomach cancer Neg Hx   ? Pancreatic cancer Neg Hx   ? Esophageal cancer Neg Hx   ? Rectal cancer Neg Hx   ? ?Social History  ? ?Socioeconomic History  ? Marital status: Divorced  ?  Spouse name: Not on file  ? Number of children: 1  ? Years of education: Not on file  ? Highest education level: Not on file  ?Occupational History  ? Occupation: Disabled  ?Tobacco Use  ? Smoking status: Never  ? Smokeless tobacco: Never  ?Vaping Use  ? Vaping Use: Never used  ?Substance and Sexual Activity  ? Alcohol use: Not Currently  ? Drug use: No  ? Sexual activity: Not Currently  ?Other Topics Concern  ? Not on file  ?Social History Narrative  ? Not on file  ? ?Social Determinants of Health  ? ?Financial Resource Strain: Low Risk   ? Difficulty of Paying Living Expenses: Not hard at all  ?Food Insecurity: No Food Insecurity  ? Worried About Charity fundraiser in the Last Year: Never true  ? Ran Out of Food in the Last Year: Never true  ?Transportation Needs: No Transportation Needs  ? Lack of Transportation (Medical): No  ? Lack of Transportation (Non-Medical): No  ?Physical Activity: Sufficiently Active  ? Days of Exercise per Week: 5 days  ? Minutes of Exercise per Session: 60 min  ?Stress: Not on file  ?Social Connections: Moderately Integrated  ? Frequency of Communication with Friends and Family: More than three times a week  ? Frequency of Social Gatherings with Friends and Family: More than three times a week  ? Attends Religious Services: More than 4 times per year  ? Active Member of Clubs or Organizations: Yes  ? Attends Archivist Meetings: More than 4 times per year  ? Marital Status: Divorced  ? ? ?Tobacco Counseling ?Counseling given: Not Answered ? ? ?Clinical Intake: ? ?Pre-visit preparation completed: Yes ? ?Pain : 0-10 ?Pain Score: 5  ?Pain Type: Chronic pain ?Pain Location: Back ? ?  ? ?Diabetes:  No ? ?How often do you need to have someone help you when you read instructions, pamphlets, or other written materials from your doctor or pharmacy?: 1 - Never ? ?Diabetic?no  ? ?  ? ?  ? ? ?Activities of Daily Living ? ?  06/03/2021  ?  1:42 PM  ?In your present state of health, do you have any difficulty performing the following  activities:  ?Hearing? 0  ?Vision? 0  ?Difficulty concentrating or making decisions? 0  ?Walking or climbing stairs? 0  ?Dressing or bathing? 0  ?Doing errands, shopping? 0  ?Preparing Food and eating ? N  ?Using the Toilet? N  ?In the past six months, have you accidently leaked urine? N  ?Do you have problems with loss of bowel control? N  ?Managing your Medications? N  ?Managing your Finances? N  ?Housekeeping or managing your Housekeeping? N  ? ? ?Patient Care Team: ?Lindell Spar, MD as PCP - General (Internal Medicine) ? ?Indicate any recent Medical Services you may have received from other than [facility] providers in the past year (date may be approximate). ? ?   ?Assessment:  ? This is a routine wellness examination for Isabel Garcia. ? ?Hearing/Vision screen ?No results found. ? ?Dietary issues and exercise activities discussed: ?Current Exercise Habits: Structured exercise class, Type of exercise: strength training/weights;stretching;calisthenics, Time (Minutes): 60, Frequency (Times/Week): 5, Weekly Exercise (Minutes/Week): 300, Intensity: Moderate, Exercise limited by: None identified ? ? Goals Addressed   ? ?  ?  ?  ?  ? This Visit's Progress  ?  DIET - EAT MORE FRUITS AND VEGETABLES     ?  Patient Stated     ?  Stay as active as you are! ?  ?  Patient Stated     ?  Continue to try to lose weight  ?  ? ?  ? ?Depression Screen ? ?  06/03/2021  ?  1:40 PM 04/29/2021  ?  2:41 PM 01/22/2021  ? 11:05 AM 10/22/2017  ?  4:10 PM  ?PHQ 2/9 Scores  ?PHQ - 2 Score 0 0 0 0  ?PHQ- 9 Score    5  ?  ?Fall Risk ? ?  06/03/2021  ?  1:42 PM 04/29/2021  ?  2:41 PM 01/22/2021  ? 11:04 AM  ?Fall Risk   ?Falls in the  past year? 0 0 0  ?Number falls in past yr: 0 0 0  ?Injury with Fall? 0 0 0  ?Risk for fall due to :  No Fall Risks No Fall Risks  ?Follow up  Falls evaluation completed Falls evaluation completed  ? ? ?FALL RISK PR

## 2021-06-05 ENCOUNTER — Telehealth: Payer: Self-pay | Admitting: Internal Medicine

## 2021-06-05 ENCOUNTER — Ambulatory Visit (INDEPENDENT_AMBULATORY_CARE_PROVIDER_SITE_OTHER): Payer: Medicare Other | Admitting: Internal Medicine

## 2021-06-05 ENCOUNTER — Encounter: Payer: Self-pay | Admitting: Internal Medicine

## 2021-06-05 VITALS — BP 132/86 | HR 53 | Resp 18 | Ht 66.0 in | Wt 208.2 lb

## 2021-06-05 DIAGNOSIS — M5442 Lumbago with sciatica, left side: Secondary | ICD-10-CM

## 2021-06-05 DIAGNOSIS — J309 Allergic rhinitis, unspecified: Secondary | ICD-10-CM

## 2021-06-05 DIAGNOSIS — E1169 Type 2 diabetes mellitus with other specified complication: Secondary | ICD-10-CM | POA: Diagnosis not present

## 2021-06-05 DIAGNOSIS — G8929 Other chronic pain: Secondary | ICD-10-CM

## 2021-06-05 MED ORDER — CYCLOBENZAPRINE HCL 5 MG PO TABS: 5.0000 mg | ORAL_TABLET | Freq: Two times a day (BID) | ORAL | 1 refills | Status: AC | PRN

## 2021-06-05 MED ORDER — PREDNISONE 10 MG (21) PO TBPK: ORAL_TABLET | ORAL | 0 refills | Status: DC

## 2021-06-05 NOTE — Progress Notes (Signed)
? ?Acute Office Visit ? ?Subjective:  ? ? Patient ID: Isabel Garcia, female    DOB: 1955-07-26, 66 y.o.   MRN: [id number] ? ?Chief Complaint  ?Patient presents with  ? Back Pain  ?  Back pain lower has been having issues for 1 month since 04-05-21 also pt has been having sinus issues started 06-05-21 has cough and sinus drainage   ? ? ?HPI ?Patient is in today for complaint of acute on chronic low back pain, which is constant, 5/10, radiating to left buttock, worse with bending and sideways movement and better with rest.  She denies any recent injury or fall.  Denies any numbness or tingling of the LE.  Denies any saddle anesthesia, urinary or stool incontinence.  She reports traveling recently, and attributes her pain to sleeping in different beds while traveling. ? ?She also complains of nasal congestion, postnasal drip and cough since this morning.  She denies any fever, chills, dyspnea or wheezing currently. ? ?Past Medical History:  ?Diagnosis Date  ? Achilles tendinitis   ? Arthritis   ? Asthma   ? Clotting disorder ([place])   ? Diverticulitis   ? Diverticulosis   ? GERD (gastroesophageal reflux disease)   ? Heart murmur   ? as a child  ? History of blood clots   ? History of chicken pox   ? History of pulmonary embolus (PE) 2018  ? Hyperlipidemia   ? Hypertension   ? Liver cirrhosis secondary to NASH (nonalcoholic steatohepatitis) ([place])   ? Osteoarthritis   ? Phlebitis   ? Plantar fasciitis   ? Sleep apnea   ? not wearing CPAP at this time  ? Vertigo   ? ? ?Past Surgical History:  ?Procedure Laterality Date  ? ABDOMINAL HYSTERECTOMY    ? bone spur removal    ? COLONOSCOPY    ? KNEE ARTHROSCOPY Right   ? KNEE ARTHROSCOPY Right   ? 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  ? Procedure: TOTAL KNEE ARTHROPLASTY;  Surgeon: Paralee Cancel, MD;  Location: [facility];  Service: Orthopedics;  Laterality: Right;  70 mins  ? ? ?Family History  ?Problem Relation Age of Onset  ? Arthritis Mother   ? Hypertension Mother   ? Hypertension  Father   ? Heart attack Father   ? Early death Father   ? Asthma Sister   ? Heart attack Brother   ? Early death Brother   ? Hypertension Brother   ? Stroke Maternal Grandmother   ? Heart attack Maternal Grandfather   ? Heart attack Paternal Grandfather   ? Early death Paternal Grandfather   ? Colon cancer Neg Hx   ? Stomach cancer Neg Hx   ? Pancreatic cancer Neg Hx   ? Esophageal cancer Neg Hx   ? Rectal cancer Neg Hx   ? ? ?Social History  ? ?Socioeconomic History  ? Marital status: Divorced  ?  Spouse name: Not on file  ? Number of children: 1  ? Years of education: Not on file  ? Highest education level: Not on file  ?Occupational History  ? Occupation: Disabled  ?Tobacco Use  ? Smoking status: Never  ? Smokeless tobacco: Never  ?Vaping Use  ? Vaping Use: Never used  ?Substance and Sexual Activity  ? Alcohol use: Not Currently  ? Drug use: No  ? Sexual activity: Not Currently  ?Other Topics Concern  ? Not on file  ?Social History Narrative  ? Not on file  ? ?Social  Determinants of Health  ? ?Financial Resource Strain: Low Risk   ? Difficulty of Paying Living Expenses: Not hard at all  ?Food Insecurity: No Food Insecurity  ? Worried About Charity fundraiser in the Last Year: Never true  ? Ran Out of Food in the Last Year: Never true  ?Transportation Needs: No Transportation Needs  ? Lack of Transportation (Medical): No  ? Lack of Transportation (Non-Medical): No  ?Physical Activity: Sufficiently Active  ? Days of Exercise per Week: 5 days  ? Minutes of Exercise per Session: 60 min  ?Stress: Not on file  ?Social Connections: Moderately Integrated  ? Frequency of Communication with Friends and Family: More than three times a week  ? Frequency of Social Gatherings with Friends and Family: More than three times a week  ? Attends Religious Services: More than 4 times per year  ? Active Member of Clubs or Organizations: Yes  ? Attends Archivist Meetings: More than 4 times per year  ? Marital Status:  Divorced  ?Intimate Partner Violence: Not At Risk  ? Fear of Current or Ex-Partner: No  ? Emotionally Abused: No  ? Physically Abused: No  ? Sexually Abused: No  ? ? ?Outpatient Medications Prior to Visit  ?Medication Sig Dispense Refill  ? albuterol (VENTOLIN HFA) 108 (90 Base) MCG/ACT inhaler Inhale 2 puffs into the lungs every 6 (six) hours as needed for wheezing or shortness of breath.     ? amLODipine (NORVASC) 10 MG tablet Take 1 tablet (10 mg total) by mouth daily. Take 1 tablet (10 mg total) daily for high blood pressure. 30 tablet 5  ? apixaban (ELIQUIS) 5 MG TABS tablet Take 1 tablet (5 mg total) by mouth 2 (two) times daily. 60 tablet 5  ? cholecalciferol (VITAMIN D3) 25 MCG (1000 UNIT) tablet Take 1,000 Units by mouth daily.    ? COD LIVER OIL PO Take 1 capsule by mouth daily.    ? fluticasone (FLONASE) 50 MCG/ACT nasal spray Place 2 sprays into both nostrils daily. 16 g 11  ? Fluticasone-Salmeterol (ADVAIR DISKUS) 250-50 MCG/DOSE AEPB Inhale 1 puff into the lungs 2 (two) times daily. 60 each 5  ? Iron-Vitamin C (IRON 100/C) 100-250 MG TABS Take by mouth.    ? lidocaine (LIDODERM) 5 % Place 1 patch onto the skin daily. Remove & Discard patch within 12 hours or as directed by MD 30 patch 0  ? omeprazole (PRILOSEC) 40 MG capsule Take 1 capsule (40 mg total) by mouth daily. 90 capsule 1  ? Zinc Sulfate (ZINC 15 PO) Take 1 capsule by mouth daily.    ? ?No facility-administered medications prior to visit.  ? ? ?No Known Allergies ? ?Review of Systems  ?Constitutional:  Negative for chills and fever.  ?HENT:  Negative for congestion, sinus pressure, sinus pain and sore throat.   ?Eyes:  Negative for pain and discharge.  ?Respiratory:  Negative for cough and shortness of breath.   ?Cardiovascular:  Negative for chest pain and palpitations.  ?Gastrointestinal:  Negative for abdominal pain, diarrhea, nausea and vomiting.  ?Endocrine: Negative for polydipsia and polyuria.  ?Genitourinary:  Negative for dysuria and  hematuria.  ?Musculoskeletal:  Positive for back pain. Negative for neck pain and neck stiffness.  ?Skin:  Negative for rash.  ?Neurological:  Negative for dizziness and weakness.  ?Psychiatric/Behavioral:  Negative for agitation and behavioral problems.   ? ?   ?Objective:  ?  ?Physical Exam ?Vitals reviewed.  ?Constitutional:   ?  General: She is not in acute distress. ?   Appearance: She is not diaphoretic.  ?HENT:  ?   Head: Normocephalic and atraumatic.  ?   Nose: Nose normal.  ?   Mouth/Throat:  ?   Mouth: Mucous membranes are moist.  ?Eyes:  ?   General: No scleral icterus. ?   Extraocular Movements: Extraocular movements intact.  ?Cardiovascular:  ?   Rate and Rhythm: Normal rate and regular rhythm.  ?   Pulses: Normal pulses.  ?   Heart sounds: Normal heart sounds. No murmur heard. ?Pulmonary:  ?   Breath sounds: Normal breath sounds. No wheezing or rales.  ?Musculoskeletal:     ?   General: Tenderness (Left lower paraspinal area) present.  ?   Cervical back: Neck supple. No tenderness.  ?   Right lower leg: No edema.  ?   Left lower leg: No edema.  ?Skin: ?   General: Skin is warm.  ?   Findings: No rash.  ?   Comments: Patchy hair loss  ?Neurological:  ?   General: No focal deficit present.  ?   Mental Status: She is alert and oriented to person, place, and time.  ?   Sensory: No sensory deficit.  ?   Motor: No weakness.  ?Psychiatric:     ?   Mood and Affect: Mood normal.     ?   Behavior: Behavior normal.  ? ? ?BP 132/86 (BP Location: Left Arm, Patient Position: Sitting, Cuff Size: Normal)   Pulse (!) 53   Resp 18   Ht 5' 6"  (1.676 m)   Wt 208 lb 3.2 oz (94.4 kg)   SpO2 99%   BMI 33.60 kg/m?  ?Wt Readings from Last 3 Encounters:  ?06/05/21 208 lb 3.2 oz (94.4 kg)  ?04/29/21 204 lb 12.8 oz (92.9 kg)  ?01/22/21 224 lb 3.2 oz (101.7 kg)  ? ? ? ?   ?Assessment & Plan:  ? ?Problem List Items Addressed This Visit   ? ? Chronic low back pain with left-sided sciatica - Primary  ?  Acute on chronic low  back pain, might be provoked by recent traveling ?Sterapred taper ?Flexeril as needed for muscle spasms/stiffness ?Advised to take Tylenol as needed for pain ?Avoid heavy lifting and frequent bending ?Simple back ex

## 2021-06-05 NOTE — Telephone Encounter (Signed)
Pt called back upset about what her AVS stated for todays visit. Wants to know if you can please give her a call? ? ?Call back # [phone] ?

## 2021-06-05 NOTE — Patient Instructions (Signed)
Please start taking Prednisone as prescribed. ? ?Please take Flexeril as needed for muscle spasms/stiffness. ? ?Please avoid heavy lifting or frequent bending. ?

## 2021-06-05 NOTE — Telephone Encounter (Signed)
FYI ? ?Spoke with pt she is not upset just wanted to know about the microalbumin and diabetes diagnosis I let her know this was added by former provider in 2019 not [place] and that we would recheck at annual exam and go from there. She stated that was fine she was just wondering as she had never been told diabetes ?

## 2021-06-05 NOTE — Assessment & Plan Note (Signed)
Acute on chronic low back pain, might be provoked by recent traveling ?Sterapred taper ?Flexeril as needed for muscle spasms/stiffness ?Advised to take Tylenol as needed for pain ?Avoid heavy lifting and frequent bending ?Simple back exercises, material provided ?

## 2021-06-08 NOTE — Progress Notes (Signed)
? ?[facility] ?[address] ?[place], [place] [address] ? ? ?CLINIC:  ?Medical Oncology/Hematology ? ?PCP:  ?Lindell Spar, MD ?[address] ?[place] [address] ?[phone] ? ? ?REASON FOR VISIT:  ?Follow-up for unprovoked DVT and PE. ?  ?CURRENT THERAPY: Eliquis. ? ?INTERVAL HISTORY:  ?Isabel Garcia 66 y.o. female returns for routine follow-up of history of unprovoked DVT and PE.  She was last seen by Dr. Delton Coombes on 05/10/2019.  She was lost to follow-up after 2021, and was referred back to our office by her primary care provider for further discussion regarding her indefinite anticoagulation. ? ?Patient does not know of any obvious provoking factors of her previous DVT/PE.  She does wonder if it could have been associated with a tight cast that she wore prior to the onset of symptoms, as well as the fact that she was on her feet for hours at a time. ? ?She reports that she is compliant with Eliquis, although she has had a few lapses in insurance where she was unable to afford her medication.  She denies any major source of bleeding such as epistaxis, hematemesis, hematochezia, or melena.  She has not had any other DVT or PE since her initial episode.  She denies any unilateral leg swelling, pain, erythema, shortness of breath, dyspnea on exertion, chest pain, cough, hemoptysis, or palpitations; no current signs or symptoms of DVT or PE. ? ?She has 100% energy and 100% appetite. She endorses that she is maintaining a stable weight. ? ? ?REVIEW OF SYSTEMS:  ?Review of Systems  ?Constitutional:  Negative for appetite change, chills, diaphoresis, fatigue, fever and unexpected weight change.  ?HENT:   Negative for lump/mass and nosebleeds.   ?Eyes:  Negative for eye problems.  ?Respiratory:  Negative for cough, hemoptysis and shortness of breath.   ?Cardiovascular:  Negative for chest pain, leg swelling and palpitations.  ?Gastrointestinal:  Negative for abdominal pain, blood in stool, constipation,  diarrhea, nausea and vomiting.  ?Genitourinary:  Negative for hematuria.   ?Musculoskeletal:  Positive for back pain.  ?Skin: Negative.   ?Neurological:  Negative for dizziness, headaches and light-headedness.  ?Hematological:  Does not bruise/bleed easily.   ? ? ?PAST MEDICAL/SURGICAL HISTORY:  ?Past Medical History:  ?Diagnosis Date  ? Achilles tendinitis   ? Arthritis   ? Asthma   ? Clotting disorder ([facility])   ? Diverticulitis   ? Diverticulosis   ? GERD (gastroesophageal reflux disease)   ? Heart murmur   ? as a child  ? History of blood clots   ? History of chicken pox   ? History of pulmonary embolus (PE) 2018  ? Hyperlipidemia   ? Hypertension   ? Liver cirrhosis secondary to NASH (nonalcoholic steatohepatitis) ([facility])   ? Osteoarthritis   ? Phlebitis   ? Plantar fasciitis   ? Sleep apnea   ? not wearing CPAP at this time  ? Vertigo   ? ?Past Surgical History:  ?Procedure Laterality Date  ? ABDOMINAL HYSTERECTOMY    ? bone spur removal    ? COLONOSCOPY    ? KNEE ARTHROSCOPY Right   ? KNEE ARTHROSCOPY Right   ? 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  ? Procedure: TOTAL KNEE ARTHROPLASTY;  Surgeon: Paralee Cancel, MD;  Location: [facility];  Service: Orthopedics;  Laterality: Right;  70 mins  ? ? ? ?SOCIAL HISTORY:  ?Social History  ? ?Socioeconomic History  ? Marital status: Divorced  ?  Spouse name: Not on  file  ? Number of children: 1  ? Years of education: Not on file  ? Highest education level: Not on file  ?Occupational History  ? Occupation: Disabled  ?Tobacco Use  ? Smoking status: Never  ? Smokeless tobacco: Never  ?Vaping Use  ? Vaping Use: Never used  ?Substance and Sexual Activity  ? Alcohol use: Not Currently  ? Drug use: No  ? Sexual activity: Not Currently  ?Other Topics Concern  ? Not on file  ?Social History Narrative  ? Not on file  ? ?Social Determinants of Health  ? ?Financial Resource Strain: Low Risk   ? Difficulty of Paying Living Expenses: Not hard at all  ?Food Insecurity: No Food Insecurity  ?  Worried About Charity fundraiser in the Last Year: Never true  ? Ran Out of Food in the Last Year: Never true  ?Transportation Needs: No Transportation Needs  ? Lack of Transportation (Medical): No  ? Lack of Transportation (Non-Medical): No  ?Physical Activity: Sufficiently Active  ? Days of Exercise per Week: 5 days  ? Minutes of Exercise per Session: 60 min  ?Stress: Not on file  ?Social Connections: Moderately Integrated  ? Frequency of Communication with Friends and Family: More than three times a week  ? Frequency of Social Gatherings with Friends and Family: More than three times a week  ? Attends Religious Services: More than 4 times per year  ? Active Member of Clubs or Organizations: Yes  ? Attends Archivist Meetings: More than 4 times per year  ? Marital Status: Divorced  ?Intimate Partner Violence: Not At Risk  ? Fear of Current or Ex-Partner: No  ? Emotionally Abused: No  ? Physically Abused: No  ? Sexually Abused: No  ? ? ?FAMILY HISTORY:  ?Family History  ?Problem Relation Age of Onset  ? Arthritis Mother   ? Hypertension Mother   ? Hypertension Father   ? Heart attack Father   ? Early death Father   ? Asthma Sister   ? Heart attack Brother   ? Early death Brother   ? Hypertension Brother   ? Stroke Maternal Grandmother   ? Heart attack Maternal Grandfather   ? Heart attack Paternal Grandfather   ? Early death Paternal Grandfather   ? Colon cancer Neg Hx   ? Stomach cancer Neg Hx   ? Pancreatic cancer Neg Hx   ? Esophageal cancer Neg Hx   ? Rectal cancer Neg Hx   ? ? ?CURRENT MEDICATIONS:  ?Outpatient Encounter Medications as of 06/10/2021  ?Medication Sig  ? albuterol (VENTOLIN HFA) 108 (90 Base) MCG/ACT inhaler Inhale 2 puffs into the lungs every 6 (six) hours as needed for wheezing or shortness of breath.   ? amLODipine (NORVASC) 10 MG tablet Take 1 tablet (10 mg total) by mouth daily. Take 1 tablet (10 mg total) daily for high blood pressure.  ? apixaban (ELIQUIS) 5 MG TABS tablet Take 1  tablet (5 mg total) by mouth 2 (two) times daily.  ? cholecalciferol (VITAMIN D3) 25 MCG (1000 UNIT) tablet Take 1,000 Units by mouth daily.  ? COD LIVER OIL PO Take 1 capsule by mouth daily.  ? cyclobenzaprine (FLEXERIL) 5 MG tablet Take 1 tablet (5 mg total) by mouth 2 (two) times daily as needed for muscle spasms.  ? fluticasone (FLONASE) 50 MCG/ACT nasal spray Place 2 sprays into both nostrils daily.  ? Fluticasone-Salmeterol (ADVAIR DISKUS) 250-50 MCG/DOSE AEPB Inhale 1 puff into the lungs  2 (two) times daily.  ? Iron-Vitamin C (IRON 100/C) 100-250 MG TABS Take by mouth.  ? lidocaine (LIDODERM) 5 % Place 1 patch onto the skin daily. Remove & Discard patch within 12 hours or as directed by MD  ? omeprazole (PRILOSEC) 40 MG capsule Take 1 capsule (40 mg total) by mouth daily.  ? predniSONE (STERAPRED UNI-PAK 21 TAB) 10 MG (21) TBPK tablet Take as package instructions.  ? Zinc Sulfate (ZINC 15 PO) Take 1 capsule by mouth daily.  ? ?No facility-administered encounter medications on file as of 06/10/2021.  ? ? ?ALLERGIES:  ?No Known Allergies ? ? ?PHYSICAL EXAM:  ?ECOG PERFORMANCE STATUS: 0 - Asymptomatic ? ?There were no vitals filed for this visit. ?There were no vitals filed for this visit. ?Physical Exam ?Constitutional:   ?   Appearance: Normal appearance. She is obese.  ?HENT:  ?   Head: Normocephalic and atraumatic.  ?   Mouth/Throat:  ?   Mouth: Mucous membranes are moist.  ?Eyes:  ?   Extraocular Movements: Extraocular movements intact.  ?   Pupils: Pupils are equal, round, and reactive to light.  ?Cardiovascular:  ?   Rate and Rhythm: Normal rate and regular rhythm.  ?   Pulses: Normal pulses.  ?   Heart sounds: Normal heart sounds.  ?Pulmonary:  ?   Effort: Pulmonary effort is normal.  ?   Breath sounds: Normal breath sounds.  ?Abdominal:  ?   General: Bowel sounds are normal.  ?   Palpations: Abdomen is soft.  ?   Tenderness: There is no abdominal tenderness.  ?Musculoskeletal:     ?   General: No  swelling.  ?   Right lower leg: No edema.  ?   Left lower leg: No edema.  ?Lymphadenopathy:  ?   Cervical: No cervical adenopathy.  ?Skin: ?   General: Skin is warm and dry.  ?Neurological:  ?   General: No

## 2021-06-10 ENCOUNTER — Inpatient Hospital Stay (HOSPITAL_COMMUNITY): Payer: Medicare Other | Admitting: Physician Assistant

## 2021-06-10 VITALS — BP 145/83 | HR 60 | Temp 98.8°F | Resp 18 | Ht 66.0 in | Wt 205.7 lb

## 2021-06-10 DIAGNOSIS — Z86711 Personal history of pulmonary embolism: Secondary | ICD-10-CM

## 2021-06-10 DIAGNOSIS — Z7901 Long term (current) use of anticoagulants: Secondary | ICD-10-CM | POA: Diagnosis not present

## 2021-06-10 NOTE — Patient Instructions (Signed)
[place] at [facility] ?Discharge Instructions ? ?You were seen today by Tarri Abernethy PA-C for your HISTORY OF BLOOD CLOTS.  Since your first blood clot did not have any obvious cause, you are at high risk of recurrent blood clots.  Therefore, I recommend that you continue Eliquis at this time.  We will see you once a year for ongoing evaluation of the risks versus benefits of blood thinners.  ? ? ?Thank you for choosing [place] at [facility] to provide your oncology and hematology care.  To afford each patient quality time with our provider, please arrive at least 15 minutes before your scheduled appointment time.  ? ?If you have a lab appointment with the [facility] please come in thru the Main Entrance and check in at the main information desk. ? ?You need to re-schedule your appointment should you arrive 10 or more minutes late.  We strive to give you quality time with our providers, and arriving late affects you and other patients whose appointments are after yours.  Also, if you no show three or more times for appointments you may be dismissed from the clinic at the providers discretion.     ?Again, thank you for choosing [facility].  Our hope is that these requests will decrease the amount of time that you wait before being seen by our physicians.       ?_____________________________________________________________ ? ?Should you have questions after your visit to [facility], please contact our office at [phone] and follow the prompts.  Our office hours are 8:00 a.m. and 4:30 p.m. Monday - Friday.  Please note that voicemails left after 4:00 p.m. may not be returned until the following business day.  We are closed weekends and major holidays.  You do have access to a nurse 24-7, just call the main number to the clinic [phone] and do not press any options, hold on the line and a nurse will answer the phone.    ? ?For prescription refill requests, have your pharmacy contact our office and allow 72 hours.   ? ?Due to Covid, you will need to wear a mask upon entering the hospital. If you do not have a mask, a mask will be given to you at the Main Entrance upon arrival. For doctor visits, patients may have 1 support person age 71 or older with them. For treatment visits, patients can not have anyone with them due to social distancing guidelines and our immunocompromised population.  ? ? ? ?

## 2021-07-03 NOTE — Progress Notes (Unsigned)
[place] [place] Phone: [phone] Subjective:   Fontaine No, am serving as a scribe for Dr. Hulan Saas.    I'm seeing this patient by the request  of:  Lindell Spar, MD  CC: Low back pain  TDV:[id number]  CURRY DULSKI is a 66 y.o. female coming in with complaint of back pain and L hip pain since January. Constant pain that is dull. Does feel that pain came back after steroid pack. Pain radiates into L groin and is also on lateral hip. Patient does water aerobics 3x a week. Was lifting has discontinued when back started hurting. Patient feels back pain started after traveling by air and car and sleeping in a bed that was not hers.   Patient recently was seen by primary care provider.  Patient was given a steroid taper pack, Flexeril as needed.  Patient did have hip x-rays done in August 2021 that were independently visualized by me showing no significant bony abnormality of the hip.  Past medical history significant for right knee replacement 2021    Past Medical History:  Diagnosis Date   Achilles tendinitis    Arthritis    Asthma    Clotting disorder ([place])    Diverticulitis    Diverticulosis    GERD (gastroesophageal reflux disease)    Heart murmur    as a child   History of blood clots    History of chicken pox    History of pulmonary embolus (PE) 2018   Hyperlipidemia    Hypertension    Liver cirrhosis secondary to NASH (nonalcoholic steatohepatitis) ([facility])    Osteoarthritis    Phlebitis    Plantar fasciitis    Sleep apnea    not wearing CPAP at this time   Vertigo    Past Surgical History:  Procedure Laterality Date   ABDOMINAL HYSTERECTOMY     bone spur removal     COLONOSCOPY     KNEE ARTHROSCOPY Right    KNEE ARTHROSCOPY Right    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   Procedure: TOTAL KNEE ARTHROPLASTY;  Surgeon: Paralee Cancel, MD;  Location: [facility];  Service: Orthopedics;   Laterality: Right;  70 mins   Social History   Socioeconomic History   Marital status: Divorced    Spouse name: Not on file   Number of children: 1   Years of education: Not on file   Highest education level: Not on file  Occupational History   Occupation: Disabled  Tobacco Use   Smoking status: Never   Smokeless tobacco: Never  Vaping Use   Vaping Use: Never used  Substance and Sexual Activity   Alcohol use: Not Currently   Drug use: No   Sexual activity: Not Currently  Other Topics Concern   Not on file  Social History Narrative   Not on file   Social Determinants of Health   Financial Resource Strain: Low Risk    Difficulty of Paying Living Expenses: Not hard at all  Food Insecurity: No Food Insecurity   Worried About Charity fundraiser in the Last Year: Never true   [place] in the Last Year: Never true  Transportation Needs: No Transportation Needs   Lack of Transportation (Medical): No   Lack of Transportation (Non-Medical): No  Physical Activity: Sufficiently Active   Days of Exercise per Week: 5 days   Minutes of Exercise per Session: 60 min  Stress:  Not on file  Social Connections: Moderately Integrated   Frequency of Communication with Friends and Family: More than three times a week   Frequency of Social Gatherings with Friends and Family: More than three times a week   Attends Religious Services: More than 4 times per year   Active Member of [employer] or Organizations: Yes   Attends Music therapist: More than 4 times per year   Marital Status: Divorced   No Known Allergies Family History  Problem Relation Age of Onset   Arthritis Mother    Hypertension Mother    Hypertension Father    Heart attack Father    Early death Father    Asthma Sister    Heart attack Brother    Early death Brother    Hypertension Brother    Stroke Maternal Grandmother    Heart attack Maternal Grandfather    Heart attack Paternal Grandfather    Early  death Paternal Grandfather    Colon cancer Neg Hx    Stomach cancer Neg Hx    Pancreatic cancer Neg Hx    Esophageal cancer Neg Hx    Rectal cancer Neg Hx     Current Outpatient Medications (Endocrine & Metabolic):    predniSONE (STERAPRED UNI-PAK 21 TAB) 10 MG (21) TBPK tablet, Take as package instructions.  Current Outpatient Medications (Cardiovascular):    amLODipine (NORVASC) 10 MG tablet, Take 1 tablet (10 mg total) by mouth daily. Take 1 tablet (10 mg total) daily for high blood pressure.  Current Outpatient Medications (Respiratory):    albuterol (VENTOLIN HFA) 108 (90 Base) MCG/ACT inhaler, Inhale 2 puffs into the lungs every 6 (six) hours as needed for wheezing or shortness of breath.    fluticasone (FLONASE) 50 MCG/ACT nasal spray, Place 2 sprays into both nostrils daily.   Fluticasone-Salmeterol (ADVAIR DISKUS) 250-50 MCG/DOSE AEPB, Inhale 1 puff into the lungs 2 (two) times daily.   Current Outpatient Medications (Hematological):    apixaban (ELIQUIS) 5 MG TABS tablet, Take 1 tablet (5 mg total) by mouth 2 (two) times daily.   Iron-Vitamin C (IRON 100/C) 100-250 MG TABS, Take by mouth.  Current Outpatient Medications (Other):    cholecalciferol (VITAMIN D3) 25 MCG (1000 UNIT) tablet, Take 1,000 Units by mouth daily.   COD LIVER OIL PO, Take 1 capsule by mouth daily.   cyclobenzaprine (FLEXERIL) 5 MG tablet, Take 1 tablet (5 mg total) by mouth 2 (two) times daily as needed for muscle spasms.   lidocaine (LIDODERM) 5 %, Place 1 patch onto the skin daily. Remove & Discard patch within 12 hours or as directed by MD   omeprazole (PRILOSEC) 40 MG capsule, Take 1 capsule (40 mg total) by mouth daily.   Zinc Sulfate (ZINC 15 PO), Take 1 capsule by mouth daily.   Reviewed prior external information including notes and imaging from  primary care provider As well as notes that were available from care everywhere and other healthcare systems.  Past medical history, social,  surgical and family history all reviewed in electronic medical record.  No pertanent information unless stated regarding to the chief complaint.   Review of Systems:  No headache, visual changes, nausea, vomiting, diarrhea, constipation, dizziness, abdominal pain, skin rash, fevers, chills, night sweats, weight loss, swollen lymph nodes, body aches, joint swelling, chest pain, shortness of breath, mood changes. POSITIVE muscle aches  Objective  Blood pressure 120/88, pulse 71, height 5' 6"  (1.676 m), weight 205 lb (93 kg), SpO2 98 %.  General: No apparent distress alert and oriented x3 mood and affect normal, dressed appropriately.  HEENT: Pupils equal, extraocular movements intact  Respiratory: Patient's speak in full sentences and does not appear short of breath  Cardiovascular: No lower extremity edema, non tender, no erythema  Gait normal with good balance and coordination.  MSK: Patient does have some loss lordosis.  Patient does have some very mild tightness with FABER test.  Negative straight leg test though.  Patient is more tender and seems to be in the thoracic area than the lumbar region actually.  Patient does have some mild tenderness to palpation in the thoracolumbar junction noted.  Tightness of the left piriformis   [id number]; 15 additional minutes spent for Therapeutic exercises as stated in above notes.  This included exercises focusing on stretching, strengthening, with significant focus on eccentric aspects.   Long term goals include an improvement in range of motion, strength, endurance as well as avoiding reinjury. Patient's frequency would include in 1-2 times a day, 3-5 times a week for a duration of 6-12 weeks. Low back exercises that included:  Pelvic tilt/bracing instruction to focus on control of the pelvic girdle and lower abdominal muscles  Glute strengthening exercises, focusing on proper firing of the glutes without engaging the low back muscles Proper stretching  techniques for maximum relief for the hamstrings, hip flexors, low back and some rotation where tolerated  Proper technique shown and discussed handout in great detail with ATC.  All questions were discussed and answered.     Impression and Recommendations:     The above documentation has been reviewed and is accurate and complete Lyndal Pulley, DO

## 2021-07-04 ENCOUNTER — Encounter: Payer: Self-pay | Admitting: Family Medicine

## 2021-07-04 ENCOUNTER — Ambulatory Visit (INDEPENDENT_AMBULATORY_CARE_PROVIDER_SITE_OTHER): Payer: Medicare Other

## 2021-07-04 ENCOUNTER — Ambulatory Visit (INDEPENDENT_AMBULATORY_CARE_PROVIDER_SITE_OTHER): Payer: Medicare Other | Admitting: Family Medicine

## 2021-07-04 VITALS — BP 120/88 | HR 71 | Ht 66.0 in | Wt 205.0 lb

## 2021-07-04 DIAGNOSIS — G8929 Other chronic pain: Secondary | ICD-10-CM | POA: Diagnosis not present

## 2021-07-04 DIAGNOSIS — M545 Low back pain, unspecified: Secondary | ICD-10-CM

## 2021-07-04 DIAGNOSIS — M25552 Pain in left hip: Secondary | ICD-10-CM | POA: Diagnosis not present

## 2021-07-04 DIAGNOSIS — M5442 Lumbago with sciatica, left side: Secondary | ICD-10-CM | POA: Diagnosis not present

## 2021-07-04 NOTE — Assessment & Plan Note (Signed)
Patient has had this problem previously and seems to be a chronic problem with exacerbation.  Repeat x-rays ordered today to further evaluate.  Patient is on a blood thinner so unable to do anti-inflammatories.  Discussed Tylenol.  Discussed home exercises and work with Product/process development scientist.  I do feel that formal physical therapy will be highly helpful for this individual as well.  Follow-up with me again in 6 to 8 weeks and if worsening symptoms can consider advanced imaging.

## 2021-07-04 NOTE — Patient Instructions (Addendum)
Aquatic PT Piriformis exercises Xray pelvis lumbar Tennis ball in back pocket  Tyelnol for severe pain See me in 7 weeks

## 2021-07-11 ENCOUNTER — Ambulatory Visit (HOSPITAL_BASED_OUTPATIENT_CLINIC_OR_DEPARTMENT_OTHER): Payer: Medicare Other | Attending: Family Medicine | Admitting: Physical Therapy

## 2021-07-11 DIAGNOSIS — M25552 Pain in left hip: Secondary | ICD-10-CM

## 2021-07-11 DIAGNOSIS — M5459 Other low back pain: Secondary | ICD-10-CM

## 2021-07-11 DIAGNOSIS — M6281 Muscle weakness (generalized): Secondary | ICD-10-CM

## 2021-07-11 DIAGNOSIS — R262 Difficulty in walking, not elsewhere classified: Secondary | ICD-10-CM

## 2021-07-11 DIAGNOSIS — M545 Low back pain, unspecified: Secondary | ICD-10-CM | POA: Insufficient documentation

## 2021-07-11 NOTE — Therapy (Signed)
OUTPATIENT PHYSICAL THERAPY THORACOLUMBAR EVALUATION   Patient Name: Isabel Garcia MRN: [id number] DOB:March 20, 1955, 66 y.o., female Today's Date: 07/12/2021   PT End of Session - 07/11/21 1619     Visit Number 1    Number of Visits 10    Date for PT Re-Evaluation 08/22/21    Authorization Type U[employer] MCR    PT Start Time 1612    PT Stop Time 5003    PT Time Calculation (min) 60 min    Activity Tolerance Patient tolerated treatment well    Behavior During Therapy WFL for tasks assessed/performed             Past Medical History:  Diagnosis Date   Achilles tendinitis    Arthritis    Asthma    Clotting disorder ([place])    Diverticulitis    Diverticulosis    GERD (gastroesophageal reflux disease)    Heart murmur    as a child   History of blood clots    History of chicken pox    History of pulmonary embolus (PE) 2018   Hyperlipidemia    Hypertension    Liver cirrhosis secondary to NASH (nonalcoholic steatohepatitis) (HCC)    Osteoarthritis    Phlebitis    Plantar fasciitis    Sleep apnea    not wearing CPAP at this time   Vertigo    Past Surgical History:  Procedure Laterality Date   ABDOMINAL HYSTERECTOMY     bone spur removal     COLONOSCOPY     KNEE ARTHROSCOPY Right    KNEE ARTHROSCOPY Right    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   Procedure: TOTAL KNEE ARTHROPLASTY;  Surgeon: Paralee Cancel, MD;  Location: [facility];  Service: Orthopedics;  Laterality: Right;  70 mins   Patient Active Problem List   Diagnosis Date Noted   Seborrheic dermatitis 04/29/2021   S/P right TKA 06/07/2019   History of DVT (deep vein thrombosis) 04/26/2018   Current use of long term anticoagulation 04/26/2018   Chronic low back pain with left-sided sciatica 10/22/2017   Obesity (BMI 35.0-39.9 without comorbidity) 10/22/2017   History of pulmonary embolus (PE) 10/20/2017   GERD (gastroesophageal reflux disease) 09/08/2017   Cough 08/11/2017   Essential hypertension 08/11/2017    Obstructive sleep apnea 08/11/2017   Asthma 03/01/2017   DM2 (diabetes mellitus, type 2) ([place]) 03/01/2017   DVT (deep vein thrombosis) in pregnancy 03/01/2017   Pulmonary emboli ([place]) 03/01/2017   Venous thromboembolism (VTE) 03/01/2017   Syncope 02/27/2017    PCP: Agnes Lawrence, MD  REFERRING PROVIDER: Hulan Saas, MD  REFERRING DIAG: M54.50 (ICD-10-CM) - Lumbar spine pain   M25.552 (ICD-10-CM) - Left hip pain   Rationale for Evaluation and Treatment Rehabilitation  THERAPY DIAG:  Other low back pain  Pain in left hip  Muscle weakness (generalized)  Difficulty in walking, not elsewhere classified  ONSET DATE: April 2023 Exacerbation  SUBJECTIVE:  SUBJECTIVE STATEMENT: Pt reports having intermittent lumbar pain since 2019.  Pt had x rays and MRIs.  Pt had lumbar injections which only provided short term relief.  Pt received PT in September/October last year and reports improved sx's.  Pt reports her pain returned January with no specific MOI.  Pt was performing water aerobics 3 times per week which helps.  Pt reports she felt good until April when she was traveling and slept in a different bed.  Pt continues with water aerobics 3x a week.  Pt received prednisone in May from PCP and she felt better after prednisone.  Pt saw MD on 07/04/2021 who gave her exercises and ordered PT.  MD order indicated aquatic therapy.  Pt has been performing exercises and using a tennis ball for STM.    Pt reports increased pain with ambulation and increased standing 10 mins.  She is limited with lifting objects.  Pt has pain with sweeping, vacuuming, and mopping.  Pt enjoys hiking but is unable to currently.   PERTINENT HISTORY:  Hx of lumbar pain, R TKA in 2021, Hx of DVT/PE in 2018, OA  PAIN:  Are you having  pain? Yes:  Location:  L sided lumbar pain which travels around hip to groin.  Pt denies any pain or N/T down L LE. Type:  Constant, Dull ache   PRECAUTIONS: Other: R TKA, Hx of DVT/PE  WEIGHT BEARING RESTRICTIONS No  FALLS:  Has patient fallen in last 6 months? No    OCCUPATION: Pt is retired  PLOF: Independent; Pt was able to perform her daily activities and functional mobility including ambulation without significant lumbar pain.  PATIENT GOALS reduce pain, loosen muscles, improve walking   OBJECTIVE:   DIAGNOSTIC FINDINGS:  Lumbar x rays (In Epic): IMPRESSION: 1.  Levoscoliosis of the thoracolumbar spine apex L1-L2.   2. Mild multilevel disc space height loss with large left eccentric bridging osteophytes, particularly bulky at L3-L4, with smaller anterior bridging osteophytes.   3. Lumbar disc and neural foraminal pathology may be further evaluated by MRI if indicated by neurologically localizing signs and symptoms.  Pelvis x ray (In Epic): IMPRESSION: 1. Mild, symmetric bilateral acetabular osteophytosis with preserved superior joint spaces.   2. No displaced fracture or dislocation of the pelvis or bilateral proximal femurs seen in single frontal view.  PATIENT SURVEYS:  FOTO Lumbar:  29 with a goal of 48 at visit #13.  Hip:  21 with a goal fo 48 at visit #15.  SCREENING FOR RED FLAGS: Bowel or bladder incontinence: No Spinal tumors: No Cauda equina syndrome: No Compression fracture: No Abdominal aneurysm: No  COGNITION:  Overall cognitive status: Within functional limits for tasks assessed     SENSATION: 2+ sensation to LT t/o bilat LE dermatomes  MUSCLE LENGTH: Hamstrings: Flexibility WFL   PALPATION: Pt was very tender to palpate L glute, lateral hip, and posterior hip and had minimal tenderness on R side.  Pt has trigger points in L glute.  LUMBAR ROM:   Active  A/PROM  eval  Flexion WFL with pain  Extension 50% with pain  Right  lateral flexion 60% with pain  Left lateral flexion 40% with pain   Right rotation WFL  Left rotation 75% with minimal pain   (Blank rows = not tested)  LOWER EXTREMITY ROM:     Active  Right eval Left eval  Hip flexion    Hip extension [facility]  Hip abduction    Hip adduction  Hip internal rotation    Hip external rotation    Knee flexion    Knee extension [facility]  Ankle dorsiflexion    Ankle plantarflexion    Ankle inversion    Ankle eversion     (Blank rows = not tested)  LOWER EXTREMITY MMT:    MMT Right eval Left eval  Hip flexion 5/5 5/5  Hip extension 3+/5 with pain 3+/5 with pain  Hip abduction    Hip adduction    Hip internal rotation    Hip external rotation 4+/5 5/5  Knee flexion 4/5 5/5  Knee extension 4/5 5/5  Ankle dorsiflexion    Ankle plantarflexion    Ankle inversion    Ankle eversion     (Blank rows = not tested)  LUMBAR SPECIAL TESTS:  Supine SLR:  R:  positive, L:  negative    GAIT: Assistive device utilized: None Level of assistance: Complete Independence Comments: decreased stance time on L LE, leans to R, antalgic gait    TODAY'S TREATMENT  Reviewed exercise program from MD which includes supine bridge, S/L clam with resistance, HS stretch with strap, and piriformis stretch.  Pt has been using a tennis ball for STM.  Pt also performing lumbar rotation.  Pt performed supine piriformis stretch x30 sec bilat.  Pt instructed in self glute mobilization with ball on wall.  Pt received a HEP handout and was educated in correct form and appropriate frequency.     PATIENT EDUCATION:  Education details: dx, prognosis, HEP, POC, relevant anatomy, objective findings, and rationale of exercises.  Person educated: Patient Education method: Explanation, Demonstration, Verbal cues, and Handouts Education comprehension: verbalized understanding, returned demonstration, verbal cues required, and needs further education   HOME EXERCISE  PROGRAM: Access Code: [email or web address] URL: [email or web address] Date: 07/12/2021 Prepared by: Ronny Flurry  Exercises - Supine Piriformis Stretch with Foot on Ground  - 2 x daily - 7 x weekly - 2-3 reps - 20-30 seconds hold - Standing Glute Med Mobilization with Small Ball on Wall  - 7 x weekly  ASSESSMENT:  CLINICAL IMPRESSION: Patient is a 66 y.o. female with a dx of LBP and L hip pain.  She has a hx of lumbar pain since 2019 though was feeling pretty good until she had an exacerbation in April.  Pt reports improved sx's with prednisone and has responded well to prior PT.  MD order indicated aquatic therapy.  Pt presents to Rx having muscle weakness in R LE and L hip extension, limited lumbar ROM, and an antalgic gait.  Pt reports increased pain with ambulation and standing 10 mins.  She is limited with lifting objects and has pain with sweeping, vacuuming, and mopping.  Pt enjoys hiking but is unable to currently.  Pt should benefit from skilled PT services including aquatic therapy to address impairments and improve overall function.    OBJECTIVE IMPAIRMENTS Abnormal gait, decreased activity tolerance, decreased mobility, difficulty walking, decreased ROM, decreased strength, hypomobility, increased muscle spasms, and pain.   ACTIVITY LIMITATIONS lifting, standing, squatting, and locomotion level  PARTICIPATION LIMITATIONS: cleaning and hiking  PERSONAL FACTORS Time since onset of injury/illness/exacerbation and 1-2 comorbidities: R TKA, Hx of DVT/PE  are also affecting patient's functional outcome.   REHAB POTENTIAL: Good  CLINICAL DECISION MAKING: Stable/uncomplicated  EVALUATION COMPLEXITY: Low   GOALS:  SHORT TERM GOALS: Target date: 08/01/2021  Pt will tolerate aquatic therapy without adverse effects for improved core strength, pain, and tolerance to activity Baseline: Goal status: INITIAL  2.  Pt will demo improved lumbar AROM to be [facility] with reduced pain for  improved mobility. Baseline:  Goal status: INITIAL  3.  Pt will report at least a 25% improvement in ambulation and standing. Baseline:  Goal status: INITIAL    LONG TERM GOALS: Target date:08/22/2021  Pt will demo improved core strength by progressing with aquatic exercises and core exercises on land without adverse effects and improved hip ext strength to 4/5 bilat for improved tolerance with and performance of daily activities and functional mobility.  Baseline:  Goal status: INITIAL  2.  Pt will be able to perform extended community ambulation without increased pain.  Baseline:  Goal status: INITIAL  3.  Pt will be able to hike.  Baseline:  Goal status: INITIAL  4.  Pt will be able to perform her normal standing activities and household chores without significant pain or limitation.  Baseline:  Goal status: INITIAL   PLAN: PT FREQUENCY: 1-2x/wk  PT DURATION: 6 weeks  PLANNED INTERVENTIONS: Therapeutic exercises, Therapeutic activity, Neuromuscular re-education, Balance training, Gait training, Patient/Family education, Joint mobilization, Stair training, Aquatic Therapy, Electrical stimulation, Spinal mobilization, Cryotherapy, Moist heat, Taping, Ultrasound, Manual therapy, and Re-evaluation.  PLAN FOR NEXT SESSION: Cont with aquatic and land based therapy with increased focus on aquatic therapy.  STW to L glute.  Core strengthening.    Selinda Michaels III PT, DPT 07/12/21 8:54 PM

## 2021-07-12 ENCOUNTER — Encounter (HOSPITAL_BASED_OUTPATIENT_CLINIC_OR_DEPARTMENT_OTHER): Payer: Self-pay | Admitting: Physical Therapy

## 2021-07-30 ENCOUNTER — Ambulatory Visit (HOSPITAL_BASED_OUTPATIENT_CLINIC_OR_DEPARTMENT_OTHER): Payer: Medicare Other | Admitting: Physical Therapy

## 2021-07-30 ENCOUNTER — Encounter (HOSPITAL_BASED_OUTPATIENT_CLINIC_OR_DEPARTMENT_OTHER): Payer: Self-pay | Admitting: Physical Therapy

## 2021-07-30 DIAGNOSIS — M5459 Other low back pain: Secondary | ICD-10-CM | POA: Diagnosis not present

## 2021-07-30 DIAGNOSIS — M6281 Muscle weakness (generalized): Secondary | ICD-10-CM

## 2021-07-30 DIAGNOSIS — R262 Difficulty in walking, not elsewhere classified: Secondary | ICD-10-CM

## 2021-07-30 DIAGNOSIS — M25552 Pain in left hip: Secondary | ICD-10-CM

## 2021-07-31 ENCOUNTER — Ambulatory Visit (HOSPITAL_BASED_OUTPATIENT_CLINIC_OR_DEPARTMENT_OTHER): Payer: Medicare Other | Admitting: Physical Therapy

## 2021-07-31 DIAGNOSIS — M5459 Other low back pain: Secondary | ICD-10-CM | POA: Diagnosis not present

## 2021-07-31 DIAGNOSIS — M6281 Muscle weakness (generalized): Secondary | ICD-10-CM

## 2021-07-31 DIAGNOSIS — M25552 Pain in left hip: Secondary | ICD-10-CM

## 2021-07-31 DIAGNOSIS — R262 Difficulty in walking, not elsewhere classified: Secondary | ICD-10-CM

## 2021-07-31 NOTE — Therapy (Signed)
OUTPATIENT PHYSICAL THERAPY THORACOLUMBAR    Patient Name: Isabel Garcia MRN: [id number] DOB:10-23-1955, 66 y.o., female Today's Date: 08/01/2021   PT End of Session - 07/31/21 1441     Visit Number 3    Number of Visits 10    Date for PT Re-Evaluation 08/22/21    Authorization Type UHC MCR    PT Start Time 1352    PT Stop Time 1433    PT Time Calculation (min) 41 min    Activity Tolerance Patient tolerated treatment well    Behavior During Therapy WFL for tasks assessed/performed              Past Medical History:  Diagnosis Date   Achilles tendinitis    Arthritis    Asthma    Clotting disorder ([place])    Diverticulitis    Diverticulosis    GERD (gastroesophageal reflux disease)    Heart murmur    as a child   History of blood clots    History of chicken pox    History of pulmonary embolus (PE) 2018   Hyperlipidemia    Hypertension    Liver cirrhosis secondary to NASH (nonalcoholic steatohepatitis) (HCC)    Osteoarthritis    Phlebitis    Plantar fasciitis    Sleep apnea    not wearing CPAP at this time   Vertigo    Past Surgical History:  Procedure Laterality Date   ABDOMINAL HYSTERECTOMY     bone spur removal     COLONOSCOPY     KNEE ARTHROSCOPY Right    KNEE ARTHROSCOPY Right    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   Procedure: TOTAL KNEE ARTHROPLASTY;  Surgeon: Paralee Cancel, MD;  Location: [facility];  Service: Orthopedics;  Laterality: Right;  70 mins   Patient Active Problem List   Diagnosis Date Noted   Seborrheic dermatitis 04/29/2021   S/P right TKA 06/07/2019   History of DVT (deep vein thrombosis) 04/26/2018   Current use of long term anticoagulation 04/26/2018   Chronic low back pain with left-sided sciatica 10/22/2017   Obesity (BMI 35.0-39.9 without comorbidity) 10/22/2017   History of pulmonary embolus (PE) 10/20/2017   GERD (gastroesophageal reflux disease) 09/08/2017   Cough 08/11/2017   Essential hypertension 08/11/2017    Obstructive sleep apnea 08/11/2017   Asthma 03/01/2017   DM2 (diabetes mellitus, type 2) ([place]) 03/01/2017   DVT (deep vein thrombosis) in pregnancy 03/01/2017   Pulmonary emboli ([place]) 03/01/2017   Venous thromboembolism (VTE) 03/01/2017   Syncope 02/27/2017    PCP: Agnes Lawrence, MD  REFERRING PROVIDER: Hulan Saas, MD  REFERRING DIAG: M54.50 (ICD-10-CM) - Lumbar spine pain   M25.552 (ICD-10-CM) - Left hip pain   Rationale for Evaluation and Treatment Rehabilitation  THERAPY DIAG:  No diagnosis found.  ONSET DATE: April 2023 Exacerbation  SUBJECTIVE:  SUBJECTIVE STATEMENT: Pt reports increased pain with ambulation and increased standing 10 mins.  She is limited with lifting objects.  Pt has pain with sweeping, vacuuming, and mopping.  Pt enjoys hiking but is unable to currently.   Pt performed aquatic therapy yesterday and had no adverse effects after Rx.  Pt states both of her hips are bothering her today which she noticed with walking today.  "Feels like she is walking thru quicksand."  Pt reports compliance with HEP.  Pt states the STW with tennis ball is very helpful.  Pt states her walking is improved.     Pt states her back is doing good.  She occasionally has pain her groin.    PERTINENT HISTORY:  Hx of lumbar pain, R TKA in 2021, Hx of DVT/PE in 2018, OA  PAIN:  Are you having pain? Yes: 6/10 Location:  L sided lower lumbar pain which travels around hip to groin.  Pt denies any pain or N/T down L LE. Type:  Constant, Dull ache   PRECAUTIONS: Other: R TKA, Hx of DVT/PE  WEIGHT BEARING RESTRICTIONS No  FALLS:  Has patient fallen in last 6 months? No    OCCUPATION: Pt is retired  PLOF: Independent; Pt was able to perform her daily activities and functional mobility  including ambulation without significant lumbar pain.  PATIENT GOALS reduce pain, loosen muscles, improve walking   OBJECTIVE:   DIAGNOSTIC FINDINGS:  Lumbar x rays (In Epic): IMPRESSION: 1.  Levoscoliosis of the thoracolumbar spine apex L1-L2.   2. Mild multilevel disc space height loss with large left eccentric bridging osteophytes, particularly bulky at L3-L4, with smaller anterior bridging osteophytes.   3. Lumbar disc and neural foraminal pathology may be further evaluated by MRI if indicated by neurologically localizing signs and symptoms.  Pelvis x ray (In Epic): IMPRESSION: 1. Mild, symmetric bilateral acetabular osteophytosis with preserved superior joint spaces.   2. No displaced fracture or dislocation of the pelvis or bilateral proximal femurs seen in single frontal view.      TODAY'S TREATMENT  Therapeutic Exercise:  Reviewed current function, response to prior Rx, HEP compliance, and pain level. Reviewed HEP. Pt performed:   Supine piriformis stretch 3x20-30 sec bilat   Supine bridge x 10 reps and with GTB around thighs x 10 reps   S/L clams with RTB 2x10 reps   Supine TrA contractions--Pt was educated in correct performance and palpation of TrA contractions   Supine alt UE/LE with TrA 2x10 reps  Manual Therapy:   Pt received STM with trigger point release and rolling to L glute/hip in R S/L'ing with pillow b/w knees.   PATIENT EDUCATION:  Education details: dx, prognosis, HEP, POC, relevant anatomy, objective findings, and rationale of exercises.  Person educated: Patient Education method: Explanation, Demonstration, Verbal cues, and Handouts Education comprehension: verbalized understanding, returned demonstration, verbal cues required, and needs further education   HOME EXERCISE PROGRAM: Access Code: [email or web address] URL: [email or web address] Date: 07/12/2021 Prepared by: Ronny Flurry  Exercises - Supine Piriformis Stretch with Foot  on Ground  - 2 x daily - 7 x weekly - 2-3 reps - 20-30 seconds hold - Standing Glute Med Mobilization with Small Ball on Wall  - 7 x weekly  ASSESSMENT:  CLINICAL IMPRESSION: Pt has been compliant with using tennis ball for STW which she states helps.  Pt performed exercises well with instruction for correct form.  She tolerated exercises well and had no c/o's.  Pt has improved tenderness and soft tissue  tightness in L glute.  Pt responded well to Rx reporting improved pain from 6/10 before Rx to 3/10 after Rx.  Pt should benefit from skilled PT services including aquatic therapy to address impairments and improve overall function.   OBJECTIVE IMPAIRMENTS Abnormal gait, decreased activity tolerance, decreased mobility, difficulty walking, decreased ROM, decreased strength, hypomobility, increased muscle spasms, and pain.   ACTIVITY LIMITATIONS lifting, standing, squatting, and locomotion level  PARTICIPATION LIMITATIONS: cleaning and hiking  PERSONAL FACTORS Time since onset of injury/illness/exacerbation and 1-2 comorbidities: R TKA, Hx of DVT/PE  are also affecting patient's functional outcome.   REHAB POTENTIAL: Good  CLINICAL DECISION MAKING: Stable/uncomplicated  EVALUATION COMPLEXITY: Low   GOALS:  SHORT TERM GOALS: Target date: 08/01/2021  Pt will tolerate aquatic therapy without adverse effects for improved core strength, pain, and tolerance to activity Baseline: Goal status: INITIAL  2.  Pt will demo improved lumbar AROM to be [facility] with reduced pain for improved mobility. Baseline:  Goal status: INITIAL  3.  Pt will report at least a 25% improvement in ambulation and standing. Baseline:  Goal status: INITIAL    LONG TERM GOALS: Target date:08/22/2021  Pt will demo improved core strength by progressing with aquatic exercises and core exercises on land without adverse effects and improved hip ext strength to 4/5 bilat for improved tolerance with and performance of daily  activities and functional mobility.  Baseline:  Goal status: INITIAL  2.  Pt will be able to perform extended community ambulation without increased pain.  Baseline:  Goal status: INITIAL  3.  Pt will be able to hike.  Baseline:  Goal status: INITIAL  4.  Pt will be able to perform her normal standing activities and household chores without significant pain or limitation.  Baseline:  Goal status: INITIAL   PLAN: PT FREQUENCY: 1-2x/wk  PT DURATION: 6 weeks  PLANNED INTERVENTIONS: Therapeutic exercises, Therapeutic activity, Neuromuscular re-education, Balance training, Gait training, Patient/Family education, Joint mobilization, Stair training, Aquatic Therapy, Electrical stimulation, Spinal mobilization, Cryotherapy, Moist heat, Taping, Ultrasound, Manual therapy, and Re-evaluation.  PLAN FOR NEXT SESSION: Cont with aquatic and land based therapy with increased focus on aquatic therapy.  STW to L glute.  Core strengthening.    Selinda Michaels III PT, DPT 08/01/21 5:39 PM

## 2021-08-01 ENCOUNTER — Encounter (HOSPITAL_BASED_OUTPATIENT_CLINIC_OR_DEPARTMENT_OTHER): Payer: Self-pay | Admitting: Physical Therapy

## 2021-08-05 ENCOUNTER — Encounter (HOSPITAL_BASED_OUTPATIENT_CLINIC_OR_DEPARTMENT_OTHER): Payer: Self-pay | Admitting: Physical Therapy

## 2021-08-05 ENCOUNTER — Ambulatory Visit (HOSPITAL_BASED_OUTPATIENT_CLINIC_OR_DEPARTMENT_OTHER): Payer: Medicare Other | Attending: Family Medicine | Admitting: Physical Therapy

## 2021-08-05 DIAGNOSIS — M6281 Muscle weakness (generalized): Secondary | ICD-10-CM | POA: Diagnosis not present

## 2021-08-05 DIAGNOSIS — M25552 Pain in left hip: Secondary | ICD-10-CM | POA: Insufficient documentation

## 2021-08-05 DIAGNOSIS — M5459 Other low back pain: Secondary | ICD-10-CM | POA: Insufficient documentation

## 2021-08-05 DIAGNOSIS — R262 Difficulty in walking, not elsewhere classified: Secondary | ICD-10-CM | POA: Insufficient documentation

## 2021-08-05 NOTE — Therapy (Signed)
OUTPATIENT PHYSICAL THERAPY THORACOLUMBAR    Patient Name: Isabel Garcia MRN: [id number] DOB:January 17, 1956, 66 y.o., female Today's Date: 08/05/2021   PT End of Session - 08/05/21 1217     Visit Number 4    Number of Visits 10    Date for PT Re-Evaluation 08/22/21    Authorization Type U[employer] MCR    PT Start Time 1200    PT Stop Time 0600    PT Time Calculation (min) 42 min    Activity Tolerance Patient tolerated treatment well    Behavior During Therapy WFL for tasks assessed/performed               Past Medical History:  Diagnosis Date   Achilles tendinitis    Arthritis    Asthma    Clotting disorder ([place])    Diverticulitis    Diverticulosis    GERD (gastroesophageal reflux disease)    Heart murmur    as a child   History of blood clots    History of chicken pox    History of pulmonary embolus (PE) 2018   Hyperlipidemia    Hypertension    Liver cirrhosis secondary to NASH (nonalcoholic steatohepatitis) (HCC)    Osteoarthritis    Phlebitis    Plantar fasciitis    Sleep apnea    not wearing CPAP at this time   Vertigo    Past Surgical History:  Procedure Laterality Date   ABDOMINAL HYSTERECTOMY     bone spur removal     COLONOSCOPY     KNEE ARTHROSCOPY Right    KNEE ARTHROSCOPY Right    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   Procedure: TOTAL KNEE ARTHROPLASTY;  Surgeon: Paralee Cancel, MD;  Location: [facility];  Service: Orthopedics;  Laterality: Right;  70 mins   Patient Active Problem List   Diagnosis Date Noted   Seborrheic dermatitis 04/29/2021   S/P right TKA 06/07/2019   History of DVT (deep vein thrombosis) 04/26/2018   Current use of long term anticoagulation 04/26/2018   Chronic low back pain with left-sided sciatica 10/22/2017   Obesity (BMI 35.0-39.9 without comorbidity) 10/22/2017   History of pulmonary embolus (PE) 10/20/2017   GERD (gastroesophageal reflux disease) 09/08/2017   Cough 08/11/2017   Essential hypertension 08/11/2017    Obstructive sleep apnea 08/11/2017   Asthma 03/01/2017   DM2 (diabetes mellitus, type 2) ([place]) 03/01/2017   DVT (deep vein thrombosis) in pregnancy 03/01/2017   Pulmonary emboli ([place]) 03/01/2017   Venous thromboembolism (VTE) 03/01/2017   Syncope 02/27/2017    PCP: Agnes Lawrence, MD  REFERRING PROVIDER: Hulan Saas, MD  REFERRING DIAG: M54.50 (ICD-10-CM) - Lumbar spine pain   M25.552 (ICD-10-CM) - Left hip pain   Rationale for Evaluation and Treatment Rehabilitation  THERAPY DIAG:  Other low back pain  Pain in left hip  Muscle weakness (generalized)  Difficulty in walking, not elsewhere classified  ONSET DATE: April 2023 Exacerbation  SUBJECTIVE:  SUBJECTIVE STATEMENT: She has increased pain with ambulation and increased standing 10 mins.  She is limited with lifting objects.  Pt has pain with sweeping, vacuuming, and mopping.  Pt enjoys hiking but is unable to currently.   Pt reports compliance with HEP.  Pt states the STW with tennis ball is very helpful.       Pt states her back is doing good.  She occasionally has pain her groin.  Pt states she felt good after prior Rx.  Pt had a massage last Friday and felt better afterwards.  Pt states her R knee is stiff today.  "Things are getting easier".  Pt reports having difficulty getting out of her car.  Pt states her walking has improved.   PERTINENT HISTORY:  Hx of lumbar pain, R TKA in 2021, Hx of DVT/PE in 2018, OA  PAIN:  Are you having pain? Yes: 3/10 Location: L hip Type:  Constant, Dull ache   PRECAUTIONS: Other: R TKA, Hx of DVT/PE  WEIGHT BEARING RESTRICTIONS No  FALLS:  Has patient fallen in last 6 months? No    OCCUPATION: Pt is retired  PLOF: Independent; Pt was able to perform her daily activities and  functional mobility including ambulation without significant lumbar pain.  PATIENT GOALS reduce pain, loosen muscles, improve walking   OBJECTIVE:   DIAGNOSTIC FINDINGS:  Lumbar x rays (In Epic): IMPRESSION: 1.  Levoscoliosis of the thoracolumbar spine apex L1-L2.   2. Mild multilevel disc space height loss with large left eccentric bridging osteophytes, particularly bulky at L3-L4, with smaller anterior bridging osteophytes.   3. Lumbar disc and neural foraminal pathology may be further evaluated by MRI if indicated by neurologically localizing signs and symptoms.  Pelvis x ray (In Epic): IMPRESSION: 1. Mild, symmetric bilateral acetabular osteophytosis with preserved superior joint spaces.   2. No displaced fracture or dislocation of the pelvis or bilateral proximal femurs seen in single frontal view.      TODAY'S TREATMENT  Therapeutic Exercise:  Reviewed current function, response to prior Rx, HEP compliance, and pain level. Aquatics   Water 3.5-4.8 ft in depth . Temp of water was 91.  Pt entered/exited the pool via stairs   -Walking forward, back and side stepping -Marching without UE support 2x15 -SLS 2x30 sec bilat -Hip circles with UE holding onto yellow buoys 2x10 reps each cw and ccw bilat -Standing hip abd 2x10 reps, flex x 10, extension x 10 reps with UE support on wall -Straddling yellow noodle with UE with hand bouys: cycling x 4 widths; add/abd x20; skiing x 20-30 -seated hip piriformis stretch 3x20-30 sec on L    Pt requires buoyancy for support and to offload joints with strengthening exercises. Viscosity of the water is needed for resistance of strengthening; water current perturbations provides challenge to standing balance unsupported, requiring increased core activation.    PATIENT EDUCATION:  Education details: dx, prognosis, HEP, POC, relevant anatomy, objective findings, and rationale of exercises.  Person educated: Patient Education  method: Explanation, Demonstration, Verbal cues, and Handouts Education comprehension: verbalized understanding, returned demonstration, verbal cues required, and needs further education   HOME EXERCISE PROGRAM: Access Code: [email or web address] URL: [email or web address] Date: 07/12/2021 Prepared by: Ronny Flurry  Exercises - Supine Piriformis Stretch with Foot on Ground  - 2 x daily - 7 x weekly - 2-3 reps - 20-30 seconds hold - Standing Glute Med Mobilization with Small Ball on Wall  - 7 x weekly  ASSESSMENT:  CLINICAL IMPRESSION: Pt is compliant  with HEP and reports improved sx's with STW with tennis ball.  Pt performed aquatic exercises well with cuing for correct form.  Pt required cuing to control movement and demonstrates improved form with cuing and instruction.  Pt responded well to Rx stating she felt good after Rx and had no increased pain.  She should benefit from continued skilled PT services including aquatic therapy to address impairments and improve overall function.   OBJECTIVE IMPAIRMENTS Abnormal gait, decreased activity tolerance, decreased mobility, difficulty walking, decreased ROM, decreased strength, hypomobility, increased muscle spasms, and pain.   ACTIVITY LIMITATIONS lifting, standing, squatting, and locomotion level  PARTICIPATION LIMITATIONS: cleaning and hiking  PERSONAL FACTORS Time since onset of injury/illness/exacerbation and 1-2 comorbidities: R TKA, Hx of DVT/PE  are also affecting patient's functional outcome.   REHAB POTENTIAL: Good  CLINICAL DECISION MAKING: Stable/uncomplicated  EVALUATION COMPLEXITY: Low   GOALS:  SHORT TERM GOALS: Target date: 08/01/2021  Pt will tolerate aquatic therapy without adverse effects for improved core strength, pain, and tolerance to activity Baseline: Goal status: INITIAL  2.  Pt will demo improved lumbar AROM to be [facility] with reduced pain for improved mobility. Baseline:  Goal status: INITIAL  3.   Pt will report at least a 25% improvement in ambulation and standing. Baseline:  Goal status: INITIAL    LONG TERM GOALS: Target date:08/22/2021  Pt will demo improved core strength by progressing with aquatic exercises and core exercises on land without adverse effects and improved hip ext strength to 4/5 bilat for improved tolerance with and performance of daily activities and functional mobility.  Baseline:  Goal status: INITIAL  2.  Pt will be able to perform extended community ambulation without increased pain.  Baseline:  Goal status: INITIAL  3.  Pt will be able to hike.  Baseline:  Goal status: INITIAL  4.  Pt will be able to perform her normal standing activities and household chores without significant pain or limitation.  Baseline:  Goal status: INITIAL   PLAN: PT FREQUENCY: 1-2x/wk  PT DURATION: 6 weeks  PLANNED INTERVENTIONS: Therapeutic exercises, Therapeutic activity, Neuromuscular re-education, Balance training, Gait training, Patient/Family education, Joint mobilization, Stair training, Aquatic Therapy, Electrical stimulation, Spinal mobilization, Cryotherapy, Moist heat, Taping, Ultrasound, Manual therapy, and Re-evaluation.  PLAN FOR NEXT SESSION: Cont with aquatic and land based therapy with increased focus on aquatic therapy.  STW to L glute.  Core strengthening.    Selinda Michaels III PT, DPT 08/05/21 3:22 PM

## 2021-08-08 ENCOUNTER — Ambulatory Visit (HOSPITAL_BASED_OUTPATIENT_CLINIC_OR_DEPARTMENT_OTHER): Payer: Medicare Other | Admitting: Physical Therapy

## 2021-08-08 ENCOUNTER — Encounter (HOSPITAL_BASED_OUTPATIENT_CLINIC_OR_DEPARTMENT_OTHER): Payer: Self-pay | Admitting: Physical Therapy

## 2021-08-08 DIAGNOSIS — M5459 Other low back pain: Secondary | ICD-10-CM

## 2021-08-08 DIAGNOSIS — M6281 Muscle weakness (generalized): Secondary | ICD-10-CM | POA: Diagnosis not present

## 2021-08-08 DIAGNOSIS — R262 Difficulty in walking, not elsewhere classified: Secondary | ICD-10-CM

## 2021-08-08 DIAGNOSIS — M25552 Pain in left hip: Secondary | ICD-10-CM

## 2021-08-08 NOTE — Therapy (Signed)
OUTPATIENT PHYSICAL THERAPY THORACOLUMBAR    Patient Name: Isabel Garcia MRN: [id number] DOB:13-Mar-1955, 66 y.o., female Today's Date: 08/08/2021   PT End of Session - 08/08/21 1641     Visit Number 5    Number of Visits 10    Date for PT Re-Evaluation 08/22/21    Authorization Type UHC MCR    PT Start Time 1619    PT Stop Time 1701    PT Time Calculation (min) 42 min    Activity Tolerance Patient tolerated treatment well    Behavior During Therapy WFL for tasks assessed/performed               Past Medical History:  Diagnosis Date   Achilles tendinitis    Arthritis    Asthma    Clotting disorder ([place])    Diverticulitis    Diverticulosis    GERD (gastroesophageal reflux disease)    Heart murmur    as a child   History of blood clots    History of chicken pox    History of pulmonary embolus (PE) 2018   Hyperlipidemia    Hypertension    Liver cirrhosis secondary to NASH (nonalcoholic steatohepatitis) (HCC)    Osteoarthritis    Phlebitis    Plantar fasciitis    Sleep apnea    not wearing CPAP at this time   Vertigo    Past Surgical History:  Procedure Laterality Date   ABDOMINAL HYSTERECTOMY     bone spur removal     COLONOSCOPY     KNEE ARTHROSCOPY Right    KNEE ARTHROSCOPY Right    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   Procedure: TOTAL KNEE ARTHROPLASTY;  Surgeon: Paralee Cancel, MD;  Location: [facility];  Service: Orthopedics;  Laterality: Right;  70 mins   Patient Active Problem List   Diagnosis Date Noted   Seborrheic dermatitis 04/29/2021   S/P right TKA 06/07/2019   History of DVT (deep vein thrombosis) 04/26/2018   Current use of long term anticoagulation 04/26/2018   Chronic low back pain with left-sided sciatica 10/22/2017   Obesity (BMI 35.0-39.9 without comorbidity) 10/22/2017   History of pulmonary embolus (PE) 10/20/2017   GERD (gastroesophageal reflux disease) 09/08/2017   Cough 08/11/2017   Essential hypertension 08/11/2017    Obstructive sleep apnea 08/11/2017   Asthma 03/01/2017   DM2 (diabetes mellitus, type 2) ([place]) 03/01/2017   DVT (deep vein thrombosis) in pregnancy 03/01/2017   Pulmonary emboli ([place]) 03/01/2017   Venous thromboembolism (VTE) 03/01/2017   Syncope 02/27/2017    PCP: Agnes Lawrence, MD  REFERRING PROVIDER: Hulan Saas, MD  REFERRING DIAG: M54.50 (ICD-10-CM) - Lumbar spine pain   M25.552 (ICD-10-CM) - Left hip pain   Rationale for Evaluation and Treatment Rehabilitation  THERAPY DIAG:  Other low back pain  Pain in left hip  Muscle weakness (generalized)  Difficulty in walking, not elsewhere classified  ONSET DATE: April 2023 Exacerbation  SUBJECTIVE:  SUBJECTIVE STATEMENT: Pt reports that her Rt knee has been "acting funky"; she attributes it to the weather.   No other changes since last visit.   PERTINENT HISTORY:  Hx of lumbar pain, R TKA in 2021, Hx of DVT/PE in 2018, OA  PAIN:  Are you having pain? Yes: 7/10 Rt knee; 5-6/10 Lt low back into hip  Location: see above Type:  Constant, Dull ache   PRECAUTIONS: Other: R TKA, Hx of DVT/PE  WEIGHT BEARING RESTRICTIONS No  FALLS:  Has patient fallen in last 6 months? No    OCCUPATION: Pt is retired  PLOF: Independent; Pt was able to perform her daily activities and functional mobility including ambulation without significant lumbar pain.  PATIENT GOALS reduce pain, loosen muscles, improve walking   OBJECTIVE:   DIAGNOSTIC FINDINGS:  Lumbar x rays (In Epic): IMPRESSION: 1.  Levoscoliosis of the thoracolumbar spine apex L1-L2.   2. Mild multilevel disc space height loss with large left eccentric bridging osteophytes, particularly bulky at L3-L4, with smaller anterior bridging osteophytes.   3. Lumbar disc and neural  foraminal pathology may be further evaluated by MRI if indicated by neurologically localizing signs and symptoms.  Pelvis x ray (In Epic): IMPRESSION: 1. Mild, symmetric bilateral acetabular osteophytosis with preserved superior joint spaces.   2. No displaced fracture or dislocation of the pelvis or bilateral proximal femurs seen in single frontal view.  TODAY'S TREATMENT  Pt seen for aquatic therapy today.  Treatment took place in water 3.25-4.5 ft in depth at the [place]. Temp of water was 91.  Pt entered/exited the pool via stairs independently with bilat rail.  -Walking forward, back and side stepping - standing quad stretch with foot on bench in water; repeated with foot on 2nd step  - fig 4 piriformis stretch holding rail at steps - High knee marching forward/ backward with reciprocal arm swing - forward/backward walking with yellow hand buoys at side - L stretch at wall; repeated with knees flexed - holding yellow noodle: hip openers / hip and knee flexion crossing midline - Stork stance with hip IR/ER - hurdle forward walk  - side step with squat L/R, arms abdct/add - forward walking lunges with yellow noodle  Pt requires the buoyancy of water for active exercises with buoyancy supported for strengthening and AROM exercises. Hydrostatic pressure also supports joints by unweighting joint load by at least 50 % in 3-4 feet depth water and 80% in chest to neck deep water. Water will provide assistance with movement using the current and laminar flow while the buoyancy reduces weight bearing. Pt requires the viscosity of the water for resistance with strengthening exercises.  PATIENT EDUCATION:  Education details: dx, prognosis, HEP, POC, relevant anatomy, objective findings, and rationale of exercises.  Person educated: Patient Education method: Explanation, Demonstration, Verbal cues, and Handouts Education comprehension: verbalized understanding, returned  demonstration, verbal cues required, and needs further education   HOME EXERCISE PROGRAM: Access Code: [email or web address] URL: [email or web address] Date: 07/12/2021 Prepared by: Ronny Flurry  Exercises - Supine Piriformis Stretch with Foot on Ground  - 2 x daily - 7 x weekly - 2-3 reps - 20-30 seconds hold - Standing Glute Med Mobilization with Small Ball on Wall  - 7 x weekly  ASSESSMENT:  CLINICAL IMPRESSION: Pt reported reduction in Rt knee pain after completing standing quad stretch and reduction of Rt LBP after completion of most exercises in water >4 ft.   Encouraged pt to use ball to STM Lt and  Rt glute, use rolling pin for Rt quad and Lt hamstring; verbalized understanding.  She required minor cues for upright posture vs looking down at feet.   She should benefit from continued skilled PT services including aquatic therapy to address impairments and improve overall function.   OBJECTIVE IMPAIRMENTS Abnormal gait, decreased activity tolerance, decreased mobility, difficulty walking, decreased ROM, decreased strength, hypomobility, increased muscle spasms, and pain.   ACTIVITY LIMITATIONS lifting, standing, squatting, and locomotion level  PARTICIPATION LIMITATIONS: cleaning and hiking  PERSONAL FACTORS Time since onset of injury/illness/exacerbation and 1-2 comorbidities: R TKA, Hx of DVT/PE  are also affecting patient's functional outcome.   REHAB POTENTIAL: Good  CLINICAL DECISION MAKING: Stable/uncomplicated  EVALUATION COMPLEXITY: Low   GOALS:  SHORT TERM GOALS: Target date: 08/01/2021  Pt will tolerate aquatic therapy without adverse effects for improved core strength, pain, and tolerance to activity Baseline: Goal status: Achieved  2.  Pt will demo improved lumbar AROM to be [facility] with reduced pain for improved mobility. Baseline:  Goal status: INITIAL  3.  Pt will report at least a 25% improvement in ambulation and standing. Baseline:  Goal status:  INITIAL    LONG TERM GOALS: Target date:08/22/2021  Pt will demo improved core strength by progressing with aquatic exercises and core exercises on land without adverse effects and improved hip ext strength to 4/5 bilat for improved tolerance with and performance of daily activities and functional mobility.  Baseline:  Goal status: INITIAL  2.  Pt will be able to perform extended community ambulation without increased pain.  Baseline:  Goal status: INITIAL  3.  Pt will be able to hike.  Baseline:  Goal status: INITIAL  4.  Pt will be able to perform her normal standing activities and household chores without significant pain or limitation.  Baseline:  Goal status: INITIAL   PLAN: PT FREQUENCY: 1-2x/wk  PT DURATION: 6 weeks  PLANNED INTERVENTIONS: Therapeutic exercises, Therapeutic activity, Neuromuscular re-education, Balance training, Gait training, Patient/Family education, Joint mobilization, Stair training, Aquatic Therapy, Electrical stimulation, Spinal mobilization, Cryotherapy, Moist heat, Taping, Ultrasound, Manual therapy, and Re-evaluation.  PLAN FOR NEXT SESSION: Cont with aquatic and land based therapy with increased focus on aquatic therapy.   Core strengthening.  Print and laminate HEP.   Kerin Perna, PTA 08/08/21 5:12 PM

## 2021-08-13 ENCOUNTER — Ambulatory Visit (HOSPITAL_BASED_OUTPATIENT_CLINIC_OR_DEPARTMENT_OTHER): Payer: Medicare Other | Admitting: Physical Therapy

## 2021-08-13 ENCOUNTER — Encounter (HOSPITAL_BASED_OUTPATIENT_CLINIC_OR_DEPARTMENT_OTHER): Payer: Self-pay | Admitting: Physical Therapy

## 2021-08-13 DIAGNOSIS — M5459 Other low back pain: Secondary | ICD-10-CM

## 2021-08-13 DIAGNOSIS — M25552 Pain in left hip: Secondary | ICD-10-CM

## 2021-08-13 DIAGNOSIS — M6281 Muscle weakness (generalized): Secondary | ICD-10-CM | POA: Diagnosis not present

## 2021-08-13 DIAGNOSIS — R262 Difficulty in walking, not elsewhere classified: Secondary | ICD-10-CM | POA: Diagnosis not present

## 2021-08-13 NOTE — Therapy (Signed)
OUTPATIENT PHYSICAL THERAPY THORACOLUMBAR    Patient Name: Isabel Garcia MRN: [id number] DOB:08/17/1955, 66 y.o., female Today's Date: 08/13/2021   PT End of Session - 08/13/21 1444     Visit Number 6    Number of Visits 10    Date for PT Re-Evaluation 08/22/21    Authorization Type [employer] MCR    PT Start Time 7121    PT Stop Time 1525    PT Time Calculation (min) 40 min    Activity Tolerance Patient tolerated treatment well    Behavior During Therapy WFL for tasks assessed/performed               Past Medical History:  Diagnosis Date   Achilles tendinitis    Arthritis    Asthma    Clotting disorder ([place])    Diverticulitis    Diverticulosis    GERD (gastroesophageal reflux disease)    Heart murmur    as a child   History of blood clots    History of chicken pox    History of pulmonary embolus (PE) 2018   Hyperlipidemia    Hypertension    Liver cirrhosis secondary to NASH (nonalcoholic steatohepatitis) (HCC)    Osteoarthritis    Phlebitis    Plantar fasciitis    Sleep apnea    not wearing CPAP at this time   Vertigo    Past Surgical History:  Procedure Laterality Date   ABDOMINAL HYSTERECTOMY     bone spur removal     COLONOSCOPY     KNEE ARTHROSCOPY Right    KNEE ARTHROSCOPY Right    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   Procedure: TOTAL KNEE ARTHROPLASTY;  Surgeon: Paralee Cancel, MD;  Location: [facility];  Service: Orthopedics;  Laterality: Right;  70 mins   Patient Active Problem List   Diagnosis Date Noted   Seborrheic dermatitis 04/29/2021   S/P right TKA 06/07/2019   History of DVT (deep vein thrombosis) 04/26/2018   Current use of long term anticoagulation 04/26/2018   Chronic low back pain with left-sided sciatica 10/22/2017   Obesity (BMI 35.0-39.9 without comorbidity) 10/22/2017   History of pulmonary embolus (PE) 10/20/2017   GERD (gastroesophageal reflux disease) 09/08/2017   Cough 08/11/2017   Essential hypertension 08/11/2017    Obstructive sleep apnea 08/11/2017   Asthma 03/01/2017   DM2 (diabetes mellitus, type 2) ([place]) 03/01/2017   DVT (deep vein thrombosis) in pregnancy 03/01/2017   Pulmonary emboli ([place]) 03/01/2017   Venous thromboembolism (VTE) 03/01/2017   Syncope 02/27/2017    PCP: Agnes Lawrence, MD  REFERRING PROVIDER: Hulan Saas, MD  REFERRING DIAG: M54.50 (ICD-10-CM) - Lumbar spine pain   M25.552 (ICD-10-CM) - Left hip pain   Rationale for Evaluation and Treatment Rehabilitation  THERAPY DIAG:  No diagnosis found.  ONSET DATE: April 2023 Exacerbation  SUBJECTIVE:  SUBJECTIVE STATEMENT: Pt reports that she has been doing some of the exercises (on land) from last aquatic session.  She was able to drive to/from [place] with less pain. "Normally I'd be down for 2 days after a trip like that".   PERTINENT HISTORY:  Hx of lumbar pain, R TKA in 2021, Hx of DVT/PE in 2018, OA  PAIN:  Are you having pain? Yes: 5-6/10 Lt low back into hip  Location: see above Type:  Constant, Dull ache   PRECAUTIONS: Other: R TKA, Hx of DVT/PE  WEIGHT BEARING RESTRICTIONS No  FALLS:  Has patient fallen in last 6 months? No    OCCUPATION: Pt is retired  PLOF: Independent; Pt was able to perform her daily activities and functional mobility including ambulation without significant lumbar pain.  PATIENT GOALS reduce pain, loosen muscles, improve walking   OBJECTIVE:   DIAGNOSTIC FINDINGS:  Lumbar x rays (In Epic): IMPRESSION: 1.  Levoscoliosis of the thoracolumbar spine apex L1-L2.   2. Mild multilevel disc space height loss with large left eccentric bridging osteophytes, particularly bulky at L3-L4, with smaller anterior bridging osteophytes.   3. Lumbar disc and neural foraminal pathology may be  further evaluated by MRI if indicated by neurologically localizing signs and symptoms.  Pelvis x ray (In Epic): IMPRESSION: 1. Mild, symmetric bilateral acetabular osteophytosis with preserved superior joint spaces.   2. No displaced fracture or dislocation of the pelvis or bilateral proximal femurs seen in single frontal view.  TODAY'S TREATMENT  Pt seen for aquatic therapy today.  Treatment took place in water 3.25-4.5 ft in depth at the [facility]. Temp of water was 91.  Pt entered/exited the pool via stairs independently with bilat rail.  -Walking forward, back and side stepping - standing quad stretch with foot on bench in water; repeated with foot on 2nd step  - standing adductor stretch - fig 4 piriformis stretch holding rail at steps - holding yellow noodle: hip openers / hip and knee flexion crossing midline - hurdle forward walk holding yellow noodle - reverse lunge walk holding yellow noodle - forward/backward walking with yellow hand buoys at side - side step squat w/ and w/out rainbow hand buoys (arm abdct/ add) - repeated reverse lunge walk holding noodle - curtsy lunge at wall -forward/ backward walk for rest and recovery - L stretch at wall; repeated with knees flexed - L hip flexor stretch with L arm raised up towards ceiling  Pt requires the buoyancy of water for active exercises with buoyancy supported for strengthening and AROM exercises. Hydrostatic pressure also supports joints by unweighting joint load by at least 50 % in 3-4 feet depth water and 80% in chest to neck deep water. Water will provide assistance with movement using the current and laminar flow while the buoyancy reduces weight bearing. Pt requires the viscosity of the water for resistance with strengthening exercises.  PATIENT EDUCATION:  Education details: dx, prognosis, HEP, POC, relevant anatomy, objective findings, and rationale of exercises.  Person educated:  Patient Education method: Explanation, Demonstration, Verbal cues, and Handouts Education comprehension: verbalized understanding, returned demonstration, verbal cues required, and needs further education   HOME EXERCISE PROGRAM: Access Code: [email or web address] URL: [email or web address] Date: 07/12/2021 Prepared by: Ronny Flurry  Exercises - Supine Piriformis Stretch with Foot on Ground  - 2 x daily - 7 x weekly - 2-3 reps - 20-30 seconds hold - Standing Glute Med Mobilization with Small Ball on Wall  - 7 x weekly  ASSESSMENT:  CLINICAL IMPRESSION: Pt required minor cues for upright posture vs looking down at feet. She reported reduction of hip pain with backwards walking and curtsy lunges at wall.  She should benefit from continued skilled PT services including aquatic therapy to address impairments and improve overall function. Progressing towards goals.    OBJECTIVE IMPAIRMENTS Abnormal gait, decreased activity tolerance, decreased mobility, difficulty walking, decreased ROM, decreased strength, hypomobility, increased muscle spasms, and pain.   ACTIVITY LIMITATIONS lifting, standing, squatting, and locomotion level  PARTICIPATION LIMITATIONS: cleaning and hiking  PERSONAL FACTORS Time since onset of injury/illness/exacerbation and 1-2 comorbidities: R TKA, Hx of DVT/PE  are also affecting patient's functional outcome.   REHAB POTENTIAL: Good  CLINICAL DECISION MAKING: Stable/uncomplicated  EVALUATION COMPLEXITY: Low   GOALS:  SHORT TERM GOALS: Target date: 08/01/2021  Pt will tolerate aquatic therapy without adverse effects for improved core strength, pain, and tolerance to activity Baseline: Goal status: Achieved  2.  Pt will demo improved lumbar AROM to be [facility] with reduced pain for improved mobility. Baseline:  Goal status: INITIAL  3.  Pt will report at least a 25% improvement in ambulation and standing. Baseline:  Goal status:Achieved     LONG TERM  GOALS: Target date:08/22/2021  Pt will demo improved core strength by progressing with aquatic exercises and core exercises on land without adverse effects and improved hip ext strength to 4/5 bilat for improved tolerance with and performance of daily activities and functional mobility.  Baseline:  Goal status: INITIAL  2.  Pt will be able to perform extended community ambulation without increased pain.  Baseline:  Goal status: INITIAL  3.  Pt will be able to hike.  Baseline:  Goal status: INITIAL  4.  Pt will be able to perform her normal standing activities and household chores without significant pain or limitation.  Baseline:  Goal status: INITIAL   PLAN: PT FREQUENCY: 1-2x/wk  PT DURATION: 6 weeks  PLANNED INTERVENTIONS: Therapeutic exercises, Therapeutic activity, Neuromuscular re-education, Balance training, Gait training, Patient/Family education, Joint mobilization, Stair training, Aquatic Therapy, Electrical stimulation, Spinal mobilization, Cryotherapy, Moist heat, Taping, Ultrasound, Manual therapy, and Re-evaluation.  PLAN FOR NEXT SESSION: Cont with aquatic and land based therapy with increased focus on aquatic therapy.   Core strengthening.  Print and laminate HEP.   Kerin Perna, PTA 08/13/21 3:38 PM

## 2021-08-15 ENCOUNTER — Ambulatory Visit (HOSPITAL_BASED_OUTPATIENT_CLINIC_OR_DEPARTMENT_OTHER): Payer: Medicare Other | Admitting: Physical Therapy

## 2021-08-15 ENCOUNTER — Encounter (HOSPITAL_BASED_OUTPATIENT_CLINIC_OR_DEPARTMENT_OTHER): Payer: Self-pay | Admitting: Physical Therapy

## 2021-08-15 DIAGNOSIS — M6281 Muscle weakness (generalized): Secondary | ICD-10-CM | POA: Diagnosis not present

## 2021-08-15 DIAGNOSIS — M5459 Other low back pain: Secondary | ICD-10-CM

## 2021-08-15 DIAGNOSIS — R262 Difficulty in walking, not elsewhere classified: Secondary | ICD-10-CM | POA: Diagnosis not present

## 2021-08-15 DIAGNOSIS — M25552 Pain in left hip: Secondary | ICD-10-CM

## 2021-08-15 NOTE — Therapy (Signed)
OUTPATIENT PHYSICAL THERAPY THORACOLUMBAR TREATMENT NOTES   Patient Name: Isabel Garcia MRN: [id number] DOB:02-14-1955, 66 y.o., female Today's Date: 08/15/2021   PT End of Session - 08/15/21 1450     Visit Number 7    Number of Visits 10    Date for PT Re-Evaluation 08/22/21    Authorization Type [employer] MCR    PT Start Time 2595    PT Stop Time 1529    PT Time Calculation (min) 40 min    Activity Tolerance Patient tolerated treatment well    Behavior During Therapy WFL for tasks assessed/performed               Past Medical History:  Diagnosis Date   Achilles tendinitis    Arthritis    Asthma    Clotting disorder ([place])    Diverticulitis    Diverticulosis    GERD (gastroesophageal reflux disease)    Heart murmur    as a child   History of blood clots    History of chicken pox    History of pulmonary embolus (PE) 2018   Hyperlipidemia    Hypertension    Liver cirrhosis secondary to NASH (nonalcoholic steatohepatitis) (HCC)    Osteoarthritis    Phlebitis    Plantar fasciitis    Sleep apnea    not wearing CPAP at this time   Vertigo    Past Surgical History:  Procedure Laterality Date   ABDOMINAL HYSTERECTOMY     bone spur removal     COLONOSCOPY     KNEE ARTHROSCOPY Right    KNEE ARTHROSCOPY Right    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   Procedure: TOTAL KNEE ARTHROPLASTY;  Surgeon: Paralee Cancel, MD;  Location: [facility];  Service: Orthopedics;  Laterality: Right;  70 mins   Patient Active Problem List   Diagnosis Date Noted   Seborrheic dermatitis 04/29/2021   S/P right TKA 06/07/2019   History of DVT (deep vein thrombosis) 04/26/2018   Current use of long term anticoagulation 04/26/2018   Chronic low back pain with left-sided sciatica 10/22/2017   Obesity (BMI 35.0-39.9 without comorbidity) 10/22/2017   History of pulmonary embolus (PE) 10/20/2017   GERD (gastroesophageal reflux disease) 09/08/2017   Cough 08/11/2017   Essential hypertension  08/11/2017   Obstructive sleep apnea 08/11/2017   Asthma 03/01/2017   DM2 (diabetes mellitus, type 2) ([place]) 03/01/2017   DVT (deep vein thrombosis) in pregnancy 03/01/2017   Pulmonary emboli ([place]) 03/01/2017   Venous thromboembolism (VTE) 03/01/2017   Syncope 02/27/2017    PCP: Agnes Lawrence, MD  REFERRING PROVIDER: Hulan Saas, MD  REFERRING DIAG: M54.50 (ICD-10-CM) - Lumbar spine pain   M25.552 (ICD-10-CM) - Left hip pain   Rationale for Evaluation and Treatment Rehabilitation  THERAPY DIAG:  No diagnosis found.  ONSET DATE: April 2023 Exacerbation  SUBJECTIVE:  SUBJECTIVE STATEMENT: Pt reports she has been doing her HEP.  She states, "it doesn't take me as long to stand upright".  "Once I get moving, I get moving."   PERTINENT HISTORY:  Hx of lumbar pain, R TKA in 2021, Hx of DVT/PE in 2018, OA  PAIN:  Are you having pain? Yes: 5/10 Lt hip  Location: see above Type:  Constant, Dull ache Alleviating factors: Curtsy lunge, stretches Aggravating factors:long drives, long walks    PRECAUTIONS: Other: R TKA, Hx of DVT/PE  WEIGHT BEARING RESTRICTIONS No  FALLS:  Has patient fallen in last 6 months? No    OCCUPATION: Pt is retired  PLOF: Independent; Pt was able to perform her daily activities and functional mobility including ambulation without significant lumbar pain.  PATIENT GOALS reduce pain, loosen muscles, improve walking   OBJECTIVE:   DIAGNOSTIC FINDINGS:  Lumbar x rays (In Epic): IMPRESSION: 1.  Levoscoliosis of the thoracolumbar spine apex L1-L2.   2. Mild multilevel disc space height loss with large left eccentric bridging osteophytes, particularly bulky at L3-L4, with smaller anterior bridging osteophytes.   3. Lumbar disc and neural foraminal pathology  may be further evaluated by MRI if indicated by neurologically localizing signs and symptoms.  Pelvis x ray (In Epic): IMPRESSION: 1. Mild, symmetric bilateral acetabular osteophytosis with preserved superior joint spaces.   2. No displaced fracture or dislocation of the pelvis or bilateral proximal femurs seen in single frontal view.  PALPATION:   08/15/21:  Lt ant hip tender near ASIS and psoas; Lt iliac crest and ASIS appear higher than R.   TODAY'S TREATMENT  Pt seen for aquatic therapy today.  Treatment took place in water 3.25-4.5 ft in depth at the [facility]. Temp of water was 91.  Pt entered/exited the pool via stairs independently with bilat rail.  -Walking forward, back and side stepping - standing R quad stretch with foot on bench in water 45 sec - Lt hamstring stretch with foot on 3rd step x 30s x 2 - side step with big squat and arms abdct add - curtsy lunge at wall - holding wall: hip openers / hip and knee flexion crossing midline; stork stance with hip IR/ER x 5 x 2 - hurdle forward walk holding yellow noodle - reverse lunge walk holding noodle - L hip flexor stretch with L arm raised up towards ceiling -backward walk for rest and recovery - fig 4 stretch at stairs holding rails  - L stretch at wall; repeated with knees flexed  Pt requires the buoyancy of water for active exercises with buoyancy supported for strengthening and AROM exercises. Hydrostatic pressure also supports joints by unweighting joint load by at least 50 % in 3-4 feet depth water and 80% in chest to neck deep water. Water will provide assistance with movement using the current and laminar flow while the buoyancy reduces weight bearing. Pt requires the viscosity of the water for resistance with strengthening exercises.  PATIENT EDUCATION:  Education details: dx, prognosis, HEP, POC, relevant anatomy, objective findings, and rationale of exercises.  Person educated:  Patient Education method: Explanation, Demonstration, Verbal cues, and Handouts Education comprehension: verbalized understanding, returned demonstration, verbal cues required, and needs further education   HOME EXERCISE PROGRAM: Access Code: [email or web address] URL: [email or web address] Date: 07/12/2021 Prepared by: Ronny Flurry  Exercises - Supine Piriformis Stretch with Foot on Ground  - 2 x daily - 7 x weekly - 2-3 reps - 20-30 seconds hold - Standing Glute Med Mobilization with  Small Ball on Wall  - 7 x weekly  ASSESSMENT:  CLINICAL IMPRESSION: Pt presents with pelvic asymmetries and continued symptoms on Lt pelvis/hip.  She reported reduction of hip pain with backwards walking and curtsy lunges at wall. Overall pain remained near 5/10.  Pt has partially met her goals. She should benefit from continued skilled PT services including aquatic therapy to address impairments and improve overall function. Progressing towards goals.    OBJECTIVE IMPAIRMENTS Abnormal gait, decreased activity tolerance, decreased mobility, difficulty walking, decreased ROM, decreased strength, hypomobility, increased muscle spasms, and pain.   ACTIVITY LIMITATIONS lifting, standing, squatting, and locomotion level  PARTICIPATION LIMITATIONS: cleaning and hiking  PERSONAL FACTORS Time since onset of injury/illness/exacerbation and 1-2 comorbidities: R TKA, Hx of DVT/PE  are also affecting patient's functional outcome.   REHAB POTENTIAL: Good  CLINICAL DECISION MAKING: Stable/uncomplicated  EVALUATION COMPLEXITY: Low   GOALS:  SHORT TERM GOALS: Target date: 08/01/2021  Pt will tolerate aquatic therapy without adverse effects for improved core strength, pain, and tolerance to activity Baseline: Goal status: Achieved  2.  Pt will demo improved lumbar AROM to be [facility] with reduced pain for improved mobility. Baseline:  Goal status: INITIAL  3.  Pt will report at least a 25% improvement in  ambulation and standing. Baseline:  Goal status:Achieved     LONG TERM GOALS: Target date:08/22/2021  Pt will demo improved core strength by progressing with aquatic exercises and core exercises on land without adverse effects and improved hip ext strength to 4/5 bilat for improved tolerance with and performance of daily activities and functional mobility.  Baseline:  Goal status: INITIAL  2.  Pt will be able to perform extended community ambulation without increased pain.  Baseline:  Goal status: INITIAL  3.  Pt will be able to hike.  Baseline: has not attempted 08/15/21 Goal status: Ongoing  4.  Pt will be able to perform her normal standing activities and household chores without significant pain or limitation.  Baseline:  Goal status: INITIAL   PLAN: PT FREQUENCY: 1-2x/wk  PT DURATION: 6 weeks  PLANNED INTERVENTIONS: Therapeutic exercises, Therapeutic activity, Neuromuscular re-education, Balance training, Gait training, Patient/Family education, Joint mobilization, Stair training, Aquatic Therapy, Electrical stimulation, Spinal mobilization, Cryotherapy, Moist heat, Taping, Ultrasound, Manual therapy, and Re-evaluation.  PLAN FOR NEXT SESSION: Cont with aquatic and land based therapy with increased focus on aquatic therapy.   Core strengthening.    Kerin Perna, PTA 08/15/21 3:27 PM

## 2021-08-20 ENCOUNTER — Ambulatory Visit (HOSPITAL_BASED_OUTPATIENT_CLINIC_OR_DEPARTMENT_OTHER): Payer: Medicare Other | Admitting: Physical Therapy

## 2021-08-21 NOTE — Progress Notes (Signed)
[place] Phone: [phone] Subjective:   Fontaine No, am serving as a scribe for Dr. Hulan Saas.   I'm seeing this patient by the request  of:  Lindell Spar, MD  CC:   OTL:[id number]  07/04/2021 Patient has had this problem previously and seems to be a chronic problem with exacerbation.  Repeat x-rays ordered today to further evaluate.  Patient is on a blood thinner so unable to do anti-inflammatories.  Discussed Tylenol.  Discussed home exercises and work with Product/process development scientist.  I do feel that formal physical therapy will be highly helpful for this individual as well.  Follow-up with me again in 6 to 8 weeks and if worsening symptoms can consider advanced imaging.  Updated 08/22/2021 SAMAMTHA TIEGS is a 66 y.o. female coming in with complaint of back pain. Patient states that her pain is improving since doing Aquatic PT. Does till have pain daily. Therapist states that they believe patient has tilted pelvis contributing to her back pain. Patient also notes having TKR left in 2021 which has altered her gait due to continued stiffness and pain over medial aspect of joint.   Xray IMPRESSION: 1.  Levoscoliosis of the thoracolumbar spine apex L1-L2.   2. Mild multilevel disc space height loss with large left eccentric bridging osteophytes, particularly bulky at L3-L4, with smaller anterior bridging osteophytes.   3. Lumbar disc and neural foraminal pathology may be further evaluated by MRI if indicated by neurologically localizing signs and symptoms.  IMPRESSION: 1. Mild, symmetric bilateral acetabular osteophytosis with preserved superior joint spaces.   2. No displaced fracture or dislocation of the pelvis or bilateral proximal femurs seen in single frontal view.     Past Medical History:  Diagnosis Date   Achilles tendinitis    Arthritis    Asthma    Clotting disorder ([place])    Diverticulitis     Diverticulosis    GERD (gastroesophageal reflux disease)    Heart murmur    as a child   History of blood clots    History of chicken pox    History of pulmonary embolus (PE) 2018   Hyperlipidemia    Hypertension    Liver cirrhosis secondary to NASH (nonalcoholic steatohepatitis) (HCC)    Osteoarthritis    Phlebitis    Plantar fasciitis    Sleep apnea    not wearing CPAP at this time   Vertigo    Past Surgical History:  Procedure Laterality Date   ABDOMINAL HYSTERECTOMY     bone spur removal     COLONOSCOPY     KNEE ARTHROSCOPY Right    KNEE ARTHROSCOPY Right    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   Procedure: TOTAL KNEE ARTHROPLASTY;  Surgeon: Paralee Cancel, MD;  Location: [facility];  Service: Orthopedics;  Laterality: Right;  70 mins   Social History   Socioeconomic History   Marital status: Divorced    Spouse name: Not on file   Number of children: 1   Years of education: Not on file   Highest education level: Not on file  Occupational History   Occupation: Disabled  Tobacco Use   Smoking status: Never   Smokeless tobacco: Never  Vaping Use   Vaping Use: Never used  Substance and Sexual Activity   Alcohol use: Not Currently   Drug use: No   Sexual activity: Not Currently  Other Topics Concern   Not on file  Social History Narrative   Not on file   Social Determinants of Health   Financial Resource Strain: Low Risk  (06/03/2021)   Overall Financial Resource Strain (CARDIA)    Difficulty of Paying Living Expenses: Not hard at all  Food Insecurity: No Food Insecurity (06/03/2021)   Hunger Vital Sign    Worried About Running Out of Food in the Last Year: Never true    Ran Out of Food in the Last Year: Never true  Transportation Needs: No Transportation Needs (06/03/2021)   PRAPARE - Hydrologist (Medical): No    Lack of Transportation (Non-Medical): No  Physical Activity: Sufficiently Active (06/03/2021)   Exercise Vital Sign     Days of Exercise per Week: 5 days    Minutes of Exercise per Session: 60 min  Stress: Not on file  Social Connections: Moderately Integrated (06/03/2021)   Social Connection and Isolation Panel [NHANES]    Frequency of Communication with Friends and Family: More than three times a week    Frequency of Social Gatherings with Friends and Family: More than three times a week    Attends Religious Services: More than 4 times per year    Active Member of [employer] or Organizations: Yes    Attends Music therapist: More than 4 times per year    Marital Status: Divorced   No Known Allergies Family History  Problem Relation Age of Onset   Arthritis Mother    Hypertension Mother    Hypertension Father    Heart attack Father    Early death Father    Asthma Sister    Heart attack Brother    Early death Brother    Hypertension Brother    Stroke Maternal Grandmother    Heart attack Maternal Grandfather    Heart attack Paternal Grandfather    Early death Paternal Grandfather    Colon cancer Neg Hx    Stomach cancer Neg Hx    Pancreatic cancer Neg Hx    Esophageal cancer Neg Hx    Rectal cancer Neg Hx     Current Outpatient Medications (Endocrine & Metabolic):    predniSONE (STERAPRED UNI-PAK 21 TAB) 10 MG (21) TBPK tablet, Take as package instructions.  Current Outpatient Medications (Cardiovascular):    amLODipine (NORVASC) 10 MG tablet, Take 1 tablet (10 mg total) by mouth daily. Take 1 tablet (10 mg total) daily for high blood pressure.  Current Outpatient Medications (Respiratory):    albuterol (VENTOLIN HFA) 108 (90 Base) MCG/ACT inhaler, Inhale 2 puffs into the lungs every 6 (six) hours as needed for wheezing or shortness of breath.    fluticasone (FLONASE) 50 MCG/ACT nasal spray, Place 2 sprays into both nostrils daily.   Fluticasone-Salmeterol (ADVAIR DISKUS) 250-50 MCG/DOSE AEPB, Inhale 1 puff into the lungs 2 (two) times daily.   Current Outpatient Medications  (Hematological):    apixaban (ELIQUIS) 5 MG TABS tablet, Take 1 tablet (5 mg total) by mouth 2 (two) times daily.   Iron-Vitamin C (IRON 100/C) 100-250 MG TABS, Take by mouth.  Current Outpatient Medications (Other):    cholecalciferol (VITAMIN D3) 25 MCG (1000 UNIT) tablet, Take 1,000 Units by mouth daily.   COD LIVER OIL PO, Take 1 capsule by mouth daily.   cyclobenzaprine (FLEXERIL) 5 MG tablet, Take 1 tablet (5 mg total) by mouth 2 (two) times daily as needed for muscle spasms.   lidocaine (LIDODERM) 5 %, Place 1 patch onto the skin daily. Remove &  Discard patch within 12 hours or as directed by MD   omeprazole (PRILOSEC) 40 MG capsule, Take 1 capsule (40 mg total) by mouth daily.   Zinc Sulfate (ZINC 15 PO), Take 1 capsule by mouth daily.   Reviewed prior external information including notes and imaging from  primary care provider As well as notes that were available from care everywhere and other healthcare systems.  Past medical history, social, surgical and family history all reviewed in electronic medical record.  No pertanent information unless stated regarding to the chief complaint.   Review of Systems:  No headache, visual changes, nausea, vomiting, diarrhea, constipation, dizziness, abdominal pain, skin rash, fevers, chills, night sweats, weight loss, swollen lymph nodes, body aches, joint swelling, chest pain, shortness of breath, mood changes. POSITIVE muscle aches  Objective  Blood pressure 114/82, pulse 76, height 5' 6"  (1.676 m), weight 209 lb (94.8 kg), SpO2 98 %.   General: No apparent distress alert and oriented x3 mood and affect normal, dressed appropriately.  HEENT: Pupils equal, extraocular movements intact  Respiratory: Patient's speak in full sentences and does not appear short of breath  Cardiovascular: No lower extremity edema, non tender, no erythema  Patient's neck exam does have some loss of lordosis.  Has tightness noted of the left lower back.  Patient  does have tightness noted in the [place] area.  Mild pain over the sacroiliac joint as well.   Osteopathic findings  T8 extended rotated and side bent left L2 flexed rotated and side bent right Sacrum left on left Pelvic shear noted    Impression and Recommendations:    The above documentation has been reviewed and is accurate and complete Lyndal Pulley, DO

## 2021-08-22 ENCOUNTER — Ambulatory Visit: Payer: Medicare Other | Admitting: Family Medicine

## 2021-08-22 VITALS — BP 114/82 | HR 76 | Ht 66.0 in | Wt 209.0 lb

## 2021-08-22 DIAGNOSIS — M9903 Segmental and somatic dysfunction of lumbar region: Secondary | ICD-10-CM

## 2021-08-22 DIAGNOSIS — M9904 Segmental and somatic dysfunction of sacral region: Secondary | ICD-10-CM

## 2021-08-22 DIAGNOSIS — M9905 Segmental and somatic dysfunction of pelvic region: Secondary | ICD-10-CM | POA: Diagnosis not present

## 2021-08-22 DIAGNOSIS — M2550 Pain in unspecified joint: Secondary | ICD-10-CM | POA: Diagnosis not present

## 2021-08-22 DIAGNOSIS — M9902 Segmental and somatic dysfunction of thoracic region: Secondary | ICD-10-CM

## 2021-08-22 DIAGNOSIS — I10 Essential (primary) hypertension: Secondary | ICD-10-CM

## 2021-08-22 LAB — SEDIMENTATION RATE: Sed Rate: 41 mm/hr — ABNORMAL HIGH (ref 0–30)

## 2021-08-22 LAB — T3, FREE: T3, Free: 3.4 pg/mL (ref 2.3–4.2)

## 2021-08-22 LAB — TSH: TSH: 0.91 u[IU]/mL (ref 0.35–5.50)

## 2021-08-22 LAB — T4, FREE: Free T4: 0.9 ng/dL (ref 0.60–1.60)

## 2021-08-22 LAB — VITAMIN B12: Vitamin B-12: 661 pg/mL (ref 211–911)

## 2021-08-22 NOTE — Patient Instructions (Signed)
Labs today Keep doing PT See you again in 2 months

## 2021-08-22 NOTE — Therapy (Addendum)
OUTPATIENT PHYSICAL THERAPY TREATMENT / PROGRESS NOTE  Progress Note Reporting Period 07/11/2021 to 08/23/2021  See note below for Objective Data and Assessment of Progress/Goals.       Patient Name: Isabel Garcia MRN: [id number] DOB:29-May-1955, 66 y.o., female Today's Date: 08/23/2021   PT End of Session - 08/23/21 0834     Visit Number 8    Number of Visits 20    Date for PT Re-Evaluation 09/20/21    Authorization Type UHC MCR    PT Start Time 0802    PT Stop Time 0850    PT Time Calculation (min) 48 min    Activity Tolerance Patient tolerated treatment well    Behavior During Therapy WFL for tasks assessed/performed              Past Medical History:  Diagnosis Date   Achilles tendinitis    Arthritis    Asthma    Clotting disorder ([place])    Diverticulitis    Diverticulosis    GERD (gastroesophageal reflux disease)    Heart murmur    as a child   History of blood clots    History of chicken pox    History of pulmonary embolus (PE) 2018   Hyperlipidemia    Hypertension    Liver cirrhosis secondary to NASH (nonalcoholic steatohepatitis) (HCC)    Osteoarthritis    Phlebitis    Plantar fasciitis    Sleep apnea    not wearing CPAP at this time   Vertigo    Past Surgical History:  Procedure Laterality Date   ABDOMINAL HYSTERECTOMY     bone spur removal     COLONOSCOPY     KNEE ARTHROSCOPY Right    KNEE ARTHROSCOPY Right    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   Procedure: TOTAL KNEE ARTHROPLASTY;  Surgeon: Paralee Cancel, MD;  Location: [facility];  Service: Orthopedics;  Laterality: Right;  70 mins   Patient Active Problem List   Diagnosis Date Noted   Somatic dysfunction of spine, sacral 08/22/2021   Seborrheic dermatitis 04/29/2021   S/P right TKA 06/07/2019   History of DVT (deep vein thrombosis) 04/26/2018   Current use of long term anticoagulation 04/26/2018   Chronic low back pain with left-sided sciatica 10/22/2017   Obesity (BMI 35.0-39.9  without comorbidity) 10/22/2017   History of pulmonary embolus (PE) 10/20/2017   GERD (gastroesophageal reflux disease) 09/08/2017   Cough 08/11/2017   Essential hypertension 08/11/2017   Obstructive sleep apnea 08/11/2017   Asthma 03/01/2017   DM2 (diabetes mellitus, type 2) ([place]) 03/01/2017   DVT (deep vein thrombosis) in pregnancy 03/01/2017   Pulmonary emboli ([place]) 03/01/2017   Venous thromboembolism (VTE) 03/01/2017   Syncope 02/27/2017    PCP: Agnes Lawrence, MD  REFERRING PROVIDER: Hulan Saas, MD  REFERRING DIAG: M54.50 (ICD-10-CM) - Lumbar spine pain   M25.552 (ICD-10-CM) - Left hip pain   Rationale for Evaluation and Treatment Rehabilitation  THERAPY DIAG:  Pain in left hip  Other low back pain  Muscle weakness (generalized)  Difficulty in walking, not elsewhere classified  ONSET DATE: April 2023 Exacerbation  SUBJECTIVE:  SUBJECTIVE STATEMENT: Pt saw MD yesterday and received a manipulation. She states she feels a little better today. She who instructed pt to continue with PT.  Pt is going to try to start walking more.  Pt reports compliance with HEP.  Pt states she is stiff today including her R knee.   FUNCTIONAL IMPROVEMENTS:  pain, ambulation, strength a little better, sitting.  Pt reports 40-45% improvement in ambulation and standing  FUNCTIONAL LIMITATIONS:  increased  pain with standing 5 mins, lifting and carrying objects, vacuuming.  Pt is not mopping and sweeping.  Pt enjoys hiking but is unable to currently.   PERTINENT HISTORY:  Hx of lumbar pain, R TKA in 2021, Hx of DVT/PE in 2018, OA  PAIN:  Are you having pain? Yes:  Intensity:  Current:  4/10, Worst:  6-7/10, Best:  3-4/10 Location:  L sided lumbar pain, L hip, L glute Type:  Constant, Dull  ache   PRECAUTIONS: Other: R TKA, Hx of DVT/PE  WEIGHT BEARING RESTRICTIONS No  FALLS:  Has patient fallen in last 6 months? No    OCCUPATION: Pt is retired  PLOF: Independent; Pt was able to perform her daily activities and functional mobility including ambulation without significant lumbar pain.  PATIENT GOALS reduce pain, loosen muscles, improve walking   OBJECTIVE:   DIAGNOSTIC FINDINGS:  Lumbar x rays (In Epic): IMPRESSION: 1.  Levoscoliosis of the thoracolumbar spine apex L1-L2.   2. Mild multilevel disc space height loss with large left eccentric bridging osteophytes, particularly bulky at L3-L4, with smaller anterior bridging osteophytes.   3. Lumbar disc and neural foraminal pathology may be further evaluated by MRI if indicated by neurologically localizing signs and symptoms.  Pelvis x ray (In Epic): IMPRESSION: 1. Mild, symmetric bilateral acetabular osteophytosis with preserved superior joint spaces.   2. No displaced fracture or dislocation of the pelvis or bilateral proximal femurs seen in single frontal view.  TODAY'S TREATMENT    PHYSICAL PERFORMANCE TESTING:  -Reviewed HEP compliance, pain levels, response to prior Rx, and reported functional progress/deficits.   PATIENT SURVEYS:  FOTO Lumbar:  Initially/Current:  29/38 with a goal of 48 at visit #13.  Hip:  21/26 with a goal fo 48 at visit #15.    PALPATION: Pt was tender to palpate L glute, lateral hip, and posterior hip and had minimal tenderness on R side.  Pt has trigger points in L glute.  LUMBAR ROM:   Active  A/PROM  eval AROM  Flexion WFL with pain WFL without pain  Extension 50% with pain 75% with pain  Right lateral flexion 60% with pain 60% without pain  Left lateral flexion 40% with pain  40% with pain  Right rotation WFL 75%  Left rotation 75% with minimal pain WFL without pain   (Blank rows = not tested)  LOWER EXTREMITY ROM:     Active  Right eval Left eval   Hip flexion    Hip extension [facility]  Hip abduction    Hip adduction    Hip internal rotation    Hip external rotation    Knee flexion    Knee extension [facility]  Ankle dorsiflexion    Ankle plantarflexion    Ankle inversion    Ankle eversion     (Blank rows = not tested)  LOWER EXTREMITY MMT:    MMT Right eval Left eval Right 08/23/21 Left 08/23/21  Hip flexion 5/5 5/5 5/5 5/5  Hip extension 3+/5 with pain 3+/5 with  pain 3+/5 without pain 3+/5 without pain  Hip abduction      Hip adduction      Hip internal rotation      Hip external rotation 4+/5 5/5 5/5 5/5  Knee flexion 4/5 5/5 4/5 5/5  Knee extension 4/5 5/5 4/5 4/5  Ankle dorsiflexion      Ankle plantarflexion      Ankle inversion      Ankle eversion       (Blank rows = not tested)  LUMBAR SPECIAL TESTS:  Supine SLR:  R: negative, L:  positive    GAIT: Assistive device utilized: None Level of assistance: Complete Independence Comments: decreased stance time on L LE, leans to R, antalgic gait, decreased toe off on R and circumducts R LE    Aquatic Therapy: Pt seen for aquatic therapy today.  Treatment took place in water 4 ft 4 in - 4 ft 8 in depth at the [employer] pool. Temp of water was 91.  Pt entered/exited the pool via stairs independently with bilat rail.   -Walking forward and bwd x 3 laps - side step with big squat and arms abdct add x 2 laps -SLS 2x30 sec bilat -Hip circles with UE holding onto yellow buoys 2x10 reps each cw and ccw bilat -Standing hip extension 2 x 10 reps with UE support on wall  Pt requires the buoyancy of water for active exercises with buoyancy supported for strengthening and AROM exercises. Hydrostatic pressure also supports joints by unweighting joint load by at least 50 % in 3-4 feet depth water and 80% in chest to neck deep water. Water will provide assistance with movement using the current and laminar flow while the buoyancy reduces weight bearing. Pt  requires the viscosity of the water for resistance with strengthening exercises.   PATIENT EDUCATION:  Education details: dx, prognosis, HEP, POC, relevant anatomy, objective findings, and rationale of exercises.  Person educated: Patient Education method: Explanation, Demonstration, Verbal cues, and Handouts Education comprehension: verbalized understanding, returned demonstration, verbal cues required, and needs further education   HOME EXERCISE PROGRAM: Access Code: [email or web address] URL: [email or web address] Date: 07/12/2021 Prepared by: Ronny Flurry  Exercises - Supine Piriformis Stretch with Foot on Ground  - 2 x daily - 7 x weekly - 2-3 reps - 20-30 seconds hold - Standing Glute Med Mobilization with Small Ball on Wall  - 7 x weekly  ASSESSMENT:  CLINICAL IMPRESSION: PN was completed today f/b aquatic therapy.  Pt is progressing with pain and function.  She continues to have pain and limitations with standing and household chores.  Pt reports improvement in ambulation though continues to have gait deficits including an antalgic limp.  Pt has some improvement in lumbar AROM and has reduced pain with lumbar AROM testing.  Pt's strength is about the same.  She has minimal improvement in R hip ER and is weaker in R knee extension.  Pt continues to have weakness in hip extensors though didn't have pain with MMT.  Pt demonstrates improved self perceived disability with FOTO scores improving for hip and lumbar.  Pt has met STG's #1,3.  Pt should benefit from cont skilled PT services to address ongoing goals and to restore desired level of function.    OBJECTIVE IMPAIRMENTS Abnormal gait, decreased activity tolerance, decreased mobility, difficulty walking, decreased ROM, decreased strength, hypomobility, increased muscle spasms, and pain.   ACTIVITY LIMITATIONS lifting, standing, squatting, and locomotion level  PARTICIPATION LIMITATIONS: cleaning and hiking  PERSONAL FACTORS  Time  since onset of injury/illness/exacerbation and 1-2 comorbidities: R TKA, Hx of DVT/PE  are also affecting patient's functional outcome.   REHAB POTENTIAL: Good  CLINICAL DECISION MAKING: Stable/uncomplicated  EVALUATION COMPLEXITY: Low   GOALS:  SHORT TERM GOALS: Target date: 08/01/2021  Pt will tolerate aquatic therapy without adverse effects for improved core strength, pain, and tolerance to activity Baseline: Goal status:  GOAL MET  2.  Pt will demo improved lumbar AROM to be [facility] with reduced pain for improved mobility. Baseline:  Goal status: ONGOING  3.  Pt will report at least a 25% improvement in ambulation and standing. Baseline:  Goal status: GOAL MET    LONG TERM GOALS: Target date:  09/20/2021  Pt will demo improved core strength by progressing with aquatic exercises and core exercises on land without adverse effects and improved hip ext strength to 4/5 bilat for improved tolerance with and performance of daily activities and functional mobility.  Baseline:  Goal status: PROGRESSING  2.  Pt will be able to perform extended community ambulation without increased pain.  Baseline:  Goal status: ONGOING  3.  Pt will be able to hike.  Baseline:  Goal status: NOT MET  4.  Pt will be able to perform her normal standing activities and household chores without significant pain or limitation.  Baseline:  Goal status: NOT MET   PLAN: PT FREQUENCY: 1-2x/wk  PT DURATION: 6 weeks  PLANNED INTERVENTIONS: Therapeutic exercises, Therapeutic activity, Neuromuscular re-education, Balance training, Gait training, Patient/Family education, Joint mobilization, Stair training, Aquatic Therapy, Electrical stimulation, Spinal mobilization, Cryotherapy, Moist heat, Taping, Ultrasound, Manual therapy, and Re-evaluation.  PLAN FOR NEXT SESSION: Cont with aquatic and land based therapy with increased focus on aquatic therapy.  STW to L glute.  Core strengthening.    Selinda Michaels III PT, DPT 08/23/21 2:07 PM

## 2021-08-22 NOTE — Assessment & Plan Note (Signed)
   Decision today to treat with OMT was based on Physical Exam  After verbal consent patient was treated with HVLA, ME, FPR techniques in, thoracic, lumbar, pelvis and sacral areas, all areas are chronic   Patient tolerated the procedure well with improvement in symptoms  Patient given exercises, stretches and lifestyle modifications  See medications in patient instructions if given  Patient will follow up in 4-8 weeks

## 2021-08-23 ENCOUNTER — Ambulatory Visit (HOSPITAL_BASED_OUTPATIENT_CLINIC_OR_DEPARTMENT_OTHER): Payer: Medicare Other | Admitting: Physical Therapy

## 2021-08-23 ENCOUNTER — Encounter (HOSPITAL_BASED_OUTPATIENT_CLINIC_OR_DEPARTMENT_OTHER): Payer: Self-pay | Admitting: Physical Therapy

## 2021-08-23 DIAGNOSIS — R262 Difficulty in walking, not elsewhere classified: Secondary | ICD-10-CM | POA: Diagnosis not present

## 2021-08-23 DIAGNOSIS — M25552 Pain in left hip: Secondary | ICD-10-CM

## 2021-08-23 DIAGNOSIS — M5459 Other low back pain: Secondary | ICD-10-CM | POA: Diagnosis not present

## 2021-08-23 DIAGNOSIS — M6281 Muscle weakness (generalized): Secondary | ICD-10-CM

## 2021-08-26 ENCOUNTER — Telehealth: Payer: Self-pay | Admitting: *Deleted

## 2021-08-26 ENCOUNTER — Ambulatory Visit (HOSPITAL_BASED_OUTPATIENT_CLINIC_OR_DEPARTMENT_OTHER): Payer: Medicare Other | Admitting: Physical Therapy

## 2021-08-26 ENCOUNTER — Encounter (HOSPITAL_BASED_OUTPATIENT_CLINIC_OR_DEPARTMENT_OTHER): Payer: Self-pay | Admitting: Physical Therapy

## 2021-08-26 DIAGNOSIS — R262 Difficulty in walking, not elsewhere classified: Secondary | ICD-10-CM | POA: Diagnosis not present

## 2021-08-26 DIAGNOSIS — M6281 Muscle weakness (generalized): Secondary | ICD-10-CM

## 2021-08-26 DIAGNOSIS — M25552 Pain in left hip: Secondary | ICD-10-CM | POA: Diagnosis not present

## 2021-08-26 DIAGNOSIS — M5459 Other low back pain: Secondary | ICD-10-CM | POA: Diagnosis not present

## 2021-08-26 NOTE — Therapy (Signed)
OUTPATIENT PHYSICAL THERAPY TREATMENT     Patient Name: Isabel Garcia MRN: [id number] DOB:01-Oct-1955, 66 y.o., female Today's Date: 08/26/2021   PT End of Session - 08/26/21 1450     Visit Number 9    Number of Visits 20    Date for PT Re-Evaluation 09/20/21    Authorization Type [employer] MCR    PT Start Time 4656    PT Stop Time 1515    PT Time Calculation (min) 42 min    Activity Tolerance Patient tolerated treatment well    Behavior During Therapy WFL for tasks assessed/performed               Past Medical History:  Diagnosis Date   Achilles tendinitis    Arthritis    Asthma    Clotting disorder ([place])    Diverticulitis    Diverticulosis    GERD (gastroesophageal reflux disease)    Heart murmur    as a child   History of blood clots    History of chicken pox    History of pulmonary embolus (PE) 2018   Hyperlipidemia    Hypertension    Liver cirrhosis secondary to NASH (nonalcoholic steatohepatitis) (HCC)    Osteoarthritis    Phlebitis    Plantar fasciitis    Sleep apnea    not wearing CPAP at this time   Vertigo    Past Surgical History:  Procedure Laterality Date   ABDOMINAL HYSTERECTOMY     bone spur removal     COLONOSCOPY     KNEE ARTHROSCOPY Right    KNEE ARTHROSCOPY Right    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   Procedure: TOTAL KNEE ARTHROPLASTY;  Surgeon: Paralee Cancel, MD;  Location: [facility];  Service: Orthopedics;  Laterality: Right;  70 mins   Patient Active Problem List   Diagnosis Date Noted   Somatic dysfunction of spine, sacral 08/22/2021   Seborrheic dermatitis 04/29/2021   S/P right TKA 06/07/2019   History of DVT (deep vein thrombosis) 04/26/2018   Current use of long term anticoagulation 04/26/2018   Chronic low back pain with left-sided sciatica 10/22/2017   Obesity (BMI 35.0-39.9 without comorbidity) 10/22/2017   History of pulmonary embolus (PE) 10/20/2017   GERD (gastroesophageal reflux disease) 09/08/2017   Cough 08/11/2017    Essential hypertension 08/11/2017   Obstructive sleep apnea 08/11/2017   Asthma 03/01/2017   DM2 (diabetes mellitus, type 2) ([place]) 03/01/2017   DVT (deep vein thrombosis) in pregnancy 03/01/2017   Pulmonary emboli ([place]) 03/01/2017   Venous thromboembolism (VTE) 03/01/2017   Syncope 02/27/2017    PCP: Agnes Lawrence, MD  REFERRING PROVIDER: Hulan Saas, MD  REFERRING DIAG: M54.50 (ICD-10-CM) - Lumbar spine pain   M25.552 (ICD-10-CM) - Left hip pain   Rationale for Evaluation and Treatment Rehabilitation  THERAPY DIAG:  Pain in left hip  Other low back pain  Muscle weakness (generalized)  Difficulty in walking, not elsewhere classified  ONSET DATE: April 2023 Exacerbation  SUBJECTIVE:  SUBJECTIVE STATEMENT: Pt denies any adverse effects after prior Rx.  Pt reports compliance with HEP.  Pt states she walked approx 1/2 mile on Saturday and Sunday.  Pt rode in the car over the weekend which bothered her.  She was hurting this AM though is feeling better now.  Pt has stiffness in R knee.  Pt states her balance is not good and she is unable to walk a straight line.    FUNCTIONAL IMPROVEMENTS:  pain, ambulation, strength a little better, sitting.  Pt reports 40-45% improvement in ambulation and standing  FUNCTIONAL LIMITATIONS:  increased  pain with standing 5 mins, lifting and carrying objects, vacuuming.  Pt is not mopping and sweeping.  Pt enjoys hiking but is unable to currently.   PERTINENT HISTORY:  Hx of lumbar pain, R TKA in 2021, Hx of DVT/PE in 2018, OA  PAIN:  Are you having pain? Yes:  Intensity:  Current:  4/10, Worst:  6-7/10, Best:  3-4/10 Location: L lateral hip Type:  Constant, Dull ache   PRECAUTIONS: Other: R TKA, Hx of DVT/PE  WEIGHT BEARING RESTRICTIONS No  FALLS:   Has patient fallen in last 6 months? No    OCCUPATION: Pt is retired  PLOF: Independent; Pt was able to perform her daily activities and functional mobility including ambulation without significant lumbar pain.  PATIENT GOALS reduce pain, loosen muscles, improve walking   OBJECTIVE:   DIAGNOSTIC FINDINGS:  Lumbar x rays (In Epic): IMPRESSION: 1.  Levoscoliosis of the thoracolumbar spine apex L1-L2.   2. Mild multilevel disc space height loss with large left eccentric bridging osteophytes, particularly bulky at L3-L4, with smaller anterior bridging osteophytes.   3. Lumbar disc and neural foraminal pathology may be further evaluated by MRI if indicated by neurologically localizing signs and symptoms.  Pelvis x ray (In Epic): IMPRESSION: 1. Mild, symmetric bilateral acetabular osteophytosis with preserved superior joint spaces.   2. No displaced fracture or dislocation of the pelvis or bilateral proximal femurs seen in single frontal view.  TODAY'S TREATMENT    Aquatic Therapy: Pt seen for aquatic therapy today.  Treatment took place in water 4 ft 4 in depth at the [facility] pool. Temp of water was 90.  Pt entered/exited the pool via stairs independently with bilat rail.   -Walking forward and bwd x 4 laps - side step with big squat and arms abdct add x 3 laps -SLS 2x30 sec bilat -Hip circles with UE holding onto yellow buoys 2x10 reps each cw and ccw bilat -Standing hip flex, abd, and extension x 10 reps each with UE support on wall -Ambulating fwd/bwd with yellow hand buoys at side x 3 laps -SLS with knee extension/flexion 2 x 10 reps -Tandem gait with yellow noodle x 2 laps -Standing quad stretch with foot on bench 2x30 sec -Seated piriformis stretch 2x20-30 sec bilat  Pt requires the buoyancy of water for active exercises with buoyancy supported for strengthening and AROM exercises. Hydrostatic pressure also supports joints by unweighting joint load  by at least 50 % in 3-4 feet depth water and 80% in chest to neck deep water. Water will provide assistance with movement using the current and laminar flow while the buoyancy reduces weight bearing. Pt requires the viscosity of the water for resistance with strengthening exercises.   PATIENT EDUCATION:  Education details: HEP, POC, relevant anatomy, exercise form, and rationale of exercises.  PT answered Pt's questions.  Person educated: Patient Education method: Explanation, Demonstration, Verbal cues, and  Handouts Education comprehension: verbalized understanding, returned demonstration, verbal cues required, and needs further education   HOME EXERCISE PROGRAM: Access Code: [email or web address] URL: [email or web address] Date: 07/12/2021 Prepared by: Ronny Flurry  Exercises - Supine Piriformis Stretch with Foot on Ground  - 2 x daily - 7 x weekly - 2-3 reps - 20-30 seconds hold - Standing Glute Med Mobilization with Small Ball on Wall  - 7 x weekly  ASSESSMENT:  CLINICAL IMPRESSION: Pt increased her walking this past weekend.  Pt tolerated aquatic exercises well and requires cuing for correct form and positioning with aquatic exercises.  Pt was slow and very focused with performing tandem gait with yellow noodle.  Pt responded well to Rx stating her R knee felt much better having improved stiffness.  She had no increased pain after Rx.  Pt should benefit from cont skilled PT services to address ongoing goals and to restore desired level of function.    OBJECTIVE IMPAIRMENTS Abnormal gait, decreased activity tolerance, decreased mobility, difficulty walking, decreased ROM, decreased strength, hypomobility, increased muscle spasms, and pain.   ACTIVITY LIMITATIONS lifting, standing, squatting, and locomotion level  PARTICIPATION LIMITATIONS: cleaning and hiking  PERSONAL FACTORS Time since onset of injury/illness/exacerbation and 1-2 comorbidities: R TKA, Hx of DVT/PE  are also  affecting patient's functional outcome.   REHAB POTENTIAL: Good  CLINICAL DECISION MAKING: Stable/uncomplicated  EVALUATION COMPLEXITY: Low   GOALS:  SHORT TERM GOALS: Target date: 08/01/2021  Pt will tolerate aquatic therapy without adverse effects for improved core strength, pain, and tolerance to activity Baseline: Goal status:  GOAL MET  2.  Pt will demo improved lumbar AROM to be [facility] with reduced pain for improved mobility. Baseline:  Goal status: ONGOING  3.  Pt will report at least a 25% improvement in ambulation and standing. Baseline:  Goal status: GOAL MET    LONG TERM GOALS: Target date:  09/20/2021  Pt will demo improved core strength by progressing with aquatic exercises and core exercises on land without adverse effects and improved hip ext strength to 4/5 bilat for improved tolerance with and performance of daily activities and functional mobility.  Baseline:  Goal status: PROGRESSING  2.  Pt will be able to perform extended community ambulation without increased pain.  Baseline:  Goal status: ONGOING  3.  Pt will be able to hike.  Baseline:  Goal status: NOT MET  4.  Pt will be able to perform her normal standing activities and household chores without significant pain or limitation.  Baseline:  Goal status: NOT MET   PLAN: PT FREQUENCY: 1-2x/wk  PT DURATION: 6 weeks  PLANNED INTERVENTIONS: Therapeutic exercises, Therapeutic activity, Neuromuscular re-education, Balance training, Gait training, Patient/Family education, Joint mobilization, Stair training, Aquatic Therapy, Electrical stimulation, Spinal mobilization, Cryotherapy, Moist heat, Taping, Ultrasound, Manual therapy, and Re-evaluation.  PLAN FOR NEXT SESSION: Cont with aquatic and land based therapy with increased focus on aquatic therapy.  STW to L glute.  Core strengthening.    Selinda Michaels III PT, DPT 08/26/21 4:47 PM

## 2021-08-26 NOTE — Telephone Encounter (Signed)
Pt called stating when she was here last week Dr. Tamala Julian told her to start taking Biotin. She's unsure of what dosage she should be taking.

## 2021-08-27 LAB — ANTI-NUCLEAR AB-TITER (ANA TITER): ANA Titer 1: 1:40 {titer} — ABNORMAL HIGH

## 2021-08-27 LAB — ANA: Anti Nuclear Antibody (ANA): POSITIVE — AB

## 2021-08-27 NOTE — Telephone Encounter (Signed)
Spoke with patient regarding dosage.

## 2021-08-27 NOTE — Telephone Encounter (Signed)
Pt states Biotin comes in MCG not MG. Wants to confirm if we meant 300 MCG.  Also, pt has taken 10,000 MCG daily for 3 days and her face is broken out with acne/rash.

## 2021-08-27 NOTE — Telephone Encounter (Signed)
Yes mcg only 353mg Yeah that is way way too much

## 2021-08-27 NOTE — Telephone Encounter (Signed)
Sent patient MyChart message with update.

## 2021-08-27 NOTE — Telephone Encounter (Signed)
Sent patient MyChart message with information.

## 2021-09-11 ENCOUNTER — Ambulatory Visit (HOSPITAL_BASED_OUTPATIENT_CLINIC_OR_DEPARTMENT_OTHER): Payer: Medicare Other | Attending: Family Medicine | Admitting: Physical Therapy

## 2021-09-11 ENCOUNTER — Encounter (HOSPITAL_BASED_OUTPATIENT_CLINIC_OR_DEPARTMENT_OTHER): Payer: Self-pay | Admitting: Physical Therapy

## 2021-09-11 DIAGNOSIS — M545 Low back pain, unspecified: Secondary | ICD-10-CM | POA: Diagnosis not present

## 2021-09-11 DIAGNOSIS — M25552 Pain in left hip: Secondary | ICD-10-CM | POA: Insufficient documentation

## 2021-09-11 DIAGNOSIS — R262 Difficulty in walking, not elsewhere classified: Secondary | ICD-10-CM | POA: Diagnosis not present

## 2021-09-11 DIAGNOSIS — M6281 Muscle weakness (generalized): Secondary | ICD-10-CM | POA: Diagnosis not present

## 2021-09-11 DIAGNOSIS — M5459 Other low back pain: Secondary | ICD-10-CM | POA: Diagnosis not present

## 2021-09-11 NOTE — Therapy (Signed)
OUTPATIENT PHYSICAL THERAPY TREATMENT     Patient Name: NELEH MULDOON MRN: [id number] DOB:[birth year], 66 y.o., female Today's Date: 09/11/2021       Past Medical History:  Diagnosis Date   Achilles tendinitis    Arthritis    Asthma    Clotting disorder ([place])    Diverticulitis    Diverticulosis    GERD (gastroesophageal reflux disease)    Heart murmur    as a child   History of blood clots    History of chicken pox    History of pulmonary embolus (PE) 2018   Hyperlipidemia    Hypertension    Liver cirrhosis secondary to NASH (nonalcoholic steatohepatitis) (HCC)    Osteoarthritis    Phlebitis    Plantar fasciitis    Sleep apnea    not wearing CPAP at this time   Vertigo    Past Surgical History:  Procedure Laterality Date   ABDOMINAL HYSTERECTOMY     bone spur removal     COLONOSCOPY     KNEE ARTHROSCOPY Right    KNEE ARTHROSCOPY Right    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   Procedure: TOTAL KNEE ARTHROPLASTY;  Surgeon: Paralee Cancel, MD;  Location: [facility];  Service: Orthopedics;  Laterality: Right;  70 mins   Patient Active Problem List   Diagnosis Date Noted   Somatic dysfunction of spine, sacral 08/22/2021   Seborrheic dermatitis 04/29/2021   S/P right TKA 06/07/2019   History of DVT (deep vein thrombosis) 04/26/2018   Current use of long term anticoagulation 04/26/2018   Chronic low back pain with left-sided sciatica 10/22/2017   Obesity (BMI 35.0-39.9 without comorbidity) 10/22/2017   History of pulmonary embolus (PE) 10/20/2017   GERD (gastroesophageal reflux disease) 09/08/2017   Cough 08/11/2017   Essential hypertension 08/11/2017   Obstructive sleep apnea 08/11/2017   Asthma 03/01/2017   DM2 (diabetes mellitus, type 2) ([place]) 03/01/2017   DVT (deep vein thrombosis) in pregnancy 03/01/2017   Pulmonary emboli ([place]) 03/01/2017   Venous thromboembolism (VTE) 03/01/2017   Syncope 02/27/2017    PCP: Agnes Lawrence, MD  REFERRING PROVIDER: Hulan Saas, MD  REFERRING DIAG: M54.50 (ICD-10-CM) - Lumbar spine pain   M25.552 (ICD-10-CM) - Left hip pain   Rationale for Evaluation and Treatment Rehabilitation  THERAPY DIAG:  No diagnosis found.  ONSET DATE: April 2023 Exacerbation  SUBJECTIVE:                                                                                                                                                                                           SUBJECTIVE STATEMENT: Pt denies any adverse effects  after prior Rx.  Pt reports compliance with HEP.  Pt states she has been traveling.  Her R knee has been bothering her.  She c/o's of stiffness in R knee.   Pt reports her back feels better.  Pt states her hip is also doing better.  Her hip has been doing well during the day though has pain at night.  Pt states she feels like she has inflammation which can happen when it gets hot outside.  Pt began having pain in her R achilles last night which she has had achilles tendinitis in the past.   Pt states she walked approx 1/2 mile on Saturday and Sunday.  Pt rode in the car over the weekend which bothered her.  She was hurting this AM though is feeling better now.  Pt has stiffness in R knee.  Pt states her balance is not good and she is unable to walk a straight line.    FUNCTIONAL IMPROVEMENTS:  pain, ambulation, strength a little better, sitting.  Pt reports 40-45% improvement in ambulation and standing  FUNCTIONAL LIMITATIONS:  increased  pain with standing 5 mins, lifting and carrying objects, vacuuming.  Pt is not mopping and sweeping.  Pt enjoys hiking but is unable to currently.   PERTINENT HISTORY:  Hx of lumbar pain, R TKA in 2021, Hx of DVT/PE in 2018, OA  PAIN:  Are you having pain? Yes:  Intensity:  4/10 ,     3/10,        6-7/10 Location: L hip /   Lumbar /  R knee Type:  Constant, Dull ache   PRECAUTIONS: Other: R TKA, Hx of DVT/PE  WEIGHT BEARING RESTRICTIONS No  FALLS:  Has patient fallen  in last 6 months? No    OCCUPATION: Pt is retired  PLOF: Independent; Pt was able to perform her daily activities and functional mobility including ambulation without significant lumbar pain.  PATIENT GOALS reduce pain, loosen muscles, improve walking   OBJECTIVE:   DIAGNOSTIC FINDINGS:  Lumbar x rays (In Epic): IMPRESSION: 1.  Levoscoliosis of the thoracolumbar spine apex L1-L2.   2. Mild multilevel disc space height loss with large left eccentric bridging osteophytes, particularly bulky at L3-L4, with smaller anterior bridging osteophytes.   3. Lumbar disc and neural foraminal pathology may be further evaluated by MRI if indicated by neurologically localizing signs and symptoms.  Pelvis x ray (In Epic): IMPRESSION: 1. Mild, symmetric bilateral acetabular osteophytosis with preserved superior joint spaces.   2. No displaced fracture or dislocation of the pelvis or bilateral proximal femurs seen in single frontal view.  TODAY'S TREATMENT    Aquatic Therapy: Pt seen for aquatic therapy today.  Treatment took place in water 4 ft 4 in depth at the [facility] pool. Temp of water was 90.  Pt entered/exited the pool via stairs independently with bilat rail.   -Walking forward and bwd x 4 laps - side step with big squat and arms abdct add x 3 laps -SLS 2x30 sec bilat -Hip circles with UE holding onto yellow buoys 2x10 reps each cw and ccw bilat -Standing hip flex, abd, and extension x 10-15 reps each with UE support on wall -SLS with knee extension/flexion 2 x 10 reps -Stationary cycling while straddling noodle with hand buoys 3x30 sec  -Dynamic cycling while straddling noodle with hand buoys x 1 lap -Standing quad stretch with foot on bench 2x30 sec R LE, pt had cramping in L  -Seated piriformis stretch 2x20-30 sec  bilat  Pt requires the buoyancy of water for active exercises with buoyancy supported for strengthening and AROM exercises. Hydrostatic pressure  also supports joints by unweighting joint load by at least 50 % in 3-4 feet depth water and 80% in chest to neck deep water. Water will provide assistance with movement using the current and laminar flow while the buoyancy reduces weight bearing. Pt requires the viscosity of the water for resistance with strengthening exercises.   PATIENT EDUCATION:  Education details: HEP, POC, relevant anatomy, exercise form, and rationale of exercises.  PT answered Pt's questions.  Person educated: Patient Education method: Explanation, Demonstration, Verbal cues, and Handouts Education comprehension: verbalized understanding, returned demonstration, verbal cues required, and needs further education   HOME EXERCISE PROGRAM: Access Code: [email or web address] URL: [email or web address] Date: 07/12/2021 Prepared by: Ronny Flurry  Exercises - Supine Piriformis Stretch with Foot on Ground  - 2 x daily - 7 x weekly - 2-3 reps - 20-30 seconds hold - Standing Glute Med Mobilization with Small Ball on Wall  - 7 x weekly  ASSESSMENT:  CLINICAL IMPRESSION: Pt increased her walking this past weekend.  Pt tolerated aquatic exercises well and requires cuing for correct form and positioning with aquatic exercises.  Pt was slow and very focused with performing tandem gait with yellow noodle.  Pt responded well to Rx stating her R knee felt much better having improved stiffness.  She had no increased pain after Rx.  Pt should benefit from cont skilled PT services to address ongoing goals and to restore desired level of function.   Feels much better 3/10 pain in lumbar and L hip.  No change in knee pain though feels flexible   OBJECTIVE IMPAIRMENTS Abnormal gait, decreased activity tolerance, decreased mobility, difficulty walking, decreased ROM, decreased strength, hypomobility, increased muscle spasms, and pain.   ACTIVITY LIMITATIONS lifting, standing, squatting, and locomotion level  PARTICIPATION  LIMITATIONS: cleaning and hiking  PERSONAL FACTORS Time since onset of injury/illness/exacerbation and 1-2 comorbidities: R TKA, Hx of DVT/PE  are also affecting patient's functional outcome.   REHAB POTENTIAL: Good  CLINICAL DECISION MAKING: Stable/uncomplicated  EVALUATION COMPLEXITY: Low   GOALS:  SHORT TERM GOALS: Target date: 08/01/2021  Pt will tolerate aquatic therapy without adverse effects for improved core strength, pain, and tolerance to activity Baseline: Goal status:  GOAL MET  2.  Pt will demo improved lumbar AROM to be [facility] with reduced pain for improved mobility. Baseline:  Goal status: ONGOING  3.  Pt will report at least a 25% improvement in ambulation and standing. Baseline:  Goal status: GOAL MET    LONG TERM GOALS: Target date:  09/20/2021  Pt will demo improved core strength by progressing with aquatic exercises and core exercises on land without adverse effects and improved hip ext strength to 4/5 bilat for improved tolerance with and performance of daily activities and functional mobility.  Baseline:  Goal status: PROGRESSING  2.  Pt will be able to perform extended community ambulation without increased pain.  Baseline:  Goal status: ONGOING  3.  Pt will be able to hike.  Baseline:  Goal status: NOT MET  4.  Pt will be able to perform her normal standing activities and household chores without significant pain or limitation.  Baseline:  Goal status: NOT MET   PLAN: PT FREQUENCY: 1-2x/wk  PT DURATION: 6 weeks  PLANNED INTERVENTIONS: Therapeutic exercises, Therapeutic activity, Neuromuscular re-education, Balance training, Gait training, Patient/Family education, Joint mobilization, Stair training, Aquatic Therapy, Electrical stimulation, Spinal  mobilization, Cryotherapy, Moist heat, Taping, Ultrasound, Manual therapy, and Re-evaluation.  PLAN FOR NEXT SESSION: Cont with aquatic and land based therapy with increased focus on aquatic therapy.   STW to L glute.  Core strengthening.    Selinda Michaels III PT, DPT 09/11/21 1:04 PM

## 2021-09-13 ENCOUNTER — Ambulatory Visit (HOSPITAL_BASED_OUTPATIENT_CLINIC_OR_DEPARTMENT_OTHER): Payer: Medicare Other | Admitting: Physical Therapy

## 2021-09-13 ENCOUNTER — Encounter (HOSPITAL_BASED_OUTPATIENT_CLINIC_OR_DEPARTMENT_OTHER): Payer: Self-pay | Admitting: Physical Therapy

## 2021-09-13 DIAGNOSIS — M6281 Muscle weakness (generalized): Secondary | ICD-10-CM | POA: Diagnosis not present

## 2021-09-13 DIAGNOSIS — M5459 Other low back pain: Secondary | ICD-10-CM | POA: Diagnosis not present

## 2021-09-13 DIAGNOSIS — R262 Difficulty in walking, not elsewhere classified: Secondary | ICD-10-CM | POA: Diagnosis not present

## 2021-09-13 DIAGNOSIS — M25552 Pain in left hip: Secondary | ICD-10-CM

## 2021-09-13 DIAGNOSIS — M545 Low back pain, unspecified: Secondary | ICD-10-CM | POA: Diagnosis not present

## 2021-09-13 NOTE — Therapy (Signed)
OUTPATIENT PHYSICAL THERAPY TREATMENT     Patient Name: Isabel Garcia MRN: [id number] DOB:18-Feb-1955, 66 y.o., female Today's Date: 09/13/2021   PT End of Session - 09/13/21 1351     Visit Number 11    Number of Visits 20    Date for PT Re-Evaluation 09/20/21    Authorization Type [employer] MCR    PT Start Time 4536    PT Stop Time 1404    PT Time Calculation (min) 45 min    Activity Tolerance Patient tolerated treatment well;No increased pain    Behavior During Therapy WFL for tasks assessed/performed                 Past Medical History:  Diagnosis Date   Achilles tendinitis    Arthritis    Asthma    Clotting disorder ([place])    Diverticulitis    Diverticulosis    GERD (gastroesophageal reflux disease)    Heart murmur    as a child   History of blood clots    History of chicken pox    History of pulmonary embolus (PE) 2018   Hyperlipidemia    Hypertension    Liver cirrhosis secondary to NASH (nonalcoholic steatohepatitis) (HCC)    Osteoarthritis    Phlebitis    Plantar fasciitis    Sleep apnea    not wearing CPAP at this time   Vertigo    Past Surgical History:  Procedure Laterality Date   ABDOMINAL HYSTERECTOMY     bone spur removal     COLONOSCOPY     KNEE ARTHROSCOPY Right    KNEE ARTHROSCOPY Right    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   Procedure: TOTAL KNEE ARTHROPLASTY;  Surgeon: Paralee Cancel, MD;  Location: [facility];  Service: Orthopedics;  Laterality: Right;  70 mins   Patient Active Problem List   Diagnosis Date Noted   Somatic dysfunction of spine, sacral 08/22/2021   Seborrheic dermatitis 04/29/2021   S/P right TKA 06/07/2019   History of DVT (deep vein thrombosis) 04/26/2018   Current use of long term anticoagulation 04/26/2018   Chronic low back pain with left-sided sciatica 10/22/2017   Obesity (BMI 35.0-39.9 without comorbidity) 10/22/2017   History of pulmonary embolus (PE) 10/20/2017   GERD (gastroesophageal reflux disease)  09/08/2017   Cough 08/11/2017   Essential hypertension 08/11/2017   Obstructive sleep apnea 08/11/2017   Asthma 03/01/2017   DM2 (diabetes mellitus, type 2) ([place]) 03/01/2017   DVT (deep vein thrombosis) in pregnancy 03/01/2017   Pulmonary emboli ([place]) 03/01/2017   Venous thromboembolism (VTE) 03/01/2017   Syncope 02/27/2017    PCP: Agnes Lawrence, MD  REFERRING PROVIDER: Hulan Saas, MD  REFERRING DIAG: M54.50 (ICD-10-CM) - Lumbar spine pain   M25.552 (ICD-10-CM) - Left hip pain   Rationale for Evaluation and Treatment Rehabilitation  THERAPY DIAG:  Pain in left hip  Other low back pain  Muscle weakness (generalized)  Difficulty in walking, not elsewhere classified  ONSET DATE: April 2023 Exacerbation  SUBJECTIVE:  SUBJECTIVE STATEMENT: Pt denies any adverse effects after prior Rx.  Pt reports compliance with HEP.  Pt states her knee is not hurting though is stiff.  Pt states her sciatic tried to start up this AM.  She used the tennis ball to perform STW and feels better.  Pt states the tennis ball really helps.  Pt also used a tea that she was told was good for inflammation and her achilles feels better.  Pt reports improved ambulation including walking further down her street.  She also states standing is a little better.    PERTINENT HISTORY:  Hx of lumbar pain, R TKA in 2021, Hx of DVT/PE in 2018, OA  PAIN:  Are you having pain? Yes:  Intensity:  5/10 ,     5/10,    /   0/10 Location: L hip /   Lumbar /  R knee Type:  Constant, Dull ache   PRECAUTIONS: Other: R TKA, Hx of DVT/PE  WEIGHT BEARING RESTRICTIONS No  FALLS:  Has patient fallen in last 6 months? No    OCCUPATION: Pt is retired  PLOF: Independent; Pt was able to perform her daily activities and functional  mobility including ambulation without significant lumbar pain.  PATIENT GOALS reduce pain, loosen muscles, improve walking   OBJECTIVE:   DIAGNOSTIC FINDINGS:  Lumbar x rays (In Epic): IMPRESSION: 1.  Levoscoliosis of the thoracolumbar spine apex L1-L2.   2. Mild multilevel disc space height loss with large left eccentric bridging osteophytes, particularly bulky at L3-L4, with smaller anterior bridging osteophytes.   3. Lumbar disc and neural foraminal pathology may be further evaluated by MRI if indicated by neurologically localizing signs and symptoms.  Pelvis x ray (In Epic): IMPRESSION: 1. Mild, symmetric bilateral acetabular osteophytosis with preserved superior joint spaces.   2. No displaced fracture or dislocation of the pelvis or bilateral proximal femurs seen in single frontal view.  TODAY'S TREATMENT    Aquatic Therapy: Pt seen for aquatic therapy today.  Treatment took place in water 4 ft 4 in depth and 3.5 inch depth at the [facility] pool. Temp of water was 90.  Pt entered/exited the pool via stairs independently with bilat rail.   -Walking forward and bwd x 4 laps - side step with big squat and arms abdct add x 3 laps -SLS 2x30 sec bilat -Hip circles with UE holding onto yellow buoys 2x10 reps each cw and ccw bilat -Standing hip flex, abd, and extension x 2x10 reps each with UE support on wall except L hip ext -SLS with knee extension/flexion with UE supported withhand buoys 2 x 10 reps -Ambulating 2 laps while holding DBs submerged -Stationary cycling while straddling noodle with hand buoys 3x30 sec  -Dynamic cycling while straddling noodle with hand buoys x 1 lap -Standing quad stretch with foot on bench 2x30 sec bilat LE  -Seated piriformis stretch 3x30 sec on L  Pt requires the buoyancy of water for active exercises with buoyancy supported for strengthening and AROM exercises. Hydrostatic pressure also supports joints by unweighting joint  load by at least 50 % in 3-4 feet depth water and 80% in chest to neck deep water. Water will provide assistance with movement using the current and laminar flow while the buoyancy reduces weight bearing. Pt requires the viscosity of the water for resistance with strengthening exercises.   PATIENT EDUCATION:  Education details: POC, relevant anatomy, exercise form, and rationale of exercises.  PT answered Pt's questions.  Person educated: Patient  Education method: Explanation, Demonstration, Verbal cues, and Handouts Education comprehension: verbalized understanding, returned demonstration, verbal cues required, and needs further education   HOME EXERCISE PROGRAM: Access Code: [email or web address] URL: [email or web address] Date: 07/12/2021 Prepared by: Ronny Flurry  Exercises - Supine Piriformis Stretch with Foot on Ground  - 2 x daily - 7 x weekly - 2-3 reps - 20-30 seconds hold - Standing Glute Med Mobilization with Small Ball on Wall  - 7 x weekly  ASSESSMENT:  CLINICAL IMPRESSION: Pt is improving with function as evidenced by subjective reports including increased ambulation outside and standing tolerance.  Pt presents to Rx stating her sciatica started acting up this AM.  She reports improved pain with STW with tennis ball.  Pt performed aquatic exercises well with cuing and instruction for correct form and positioning.    Pt demonstrates improved form, stability, and control with stationary and dynamic cycling on noodle.  Pt had some lumbar pain with standing L hip extension which PT had pt stop performing on L LE.  Pt also had some pain/discomfort with seated R piriformis stretch today.  PT had pt only perform seated piriformis stretch on L today.  Pt reports improved lumbar sx's with seated L piriformis stretch.  Pt responded well to Rx stating she felt better after Rx and pain improving from 5/10 to 4/10.  Pt should benefit from cont skilled PT services to address ongoing goals  and to restore desired level of function.      OBJECTIVE IMPAIRMENTS Abnormal gait, decreased activity tolerance, decreased mobility, difficulty walking, decreased ROM, decreased strength, hypomobility, increased muscle spasms, and pain.   ACTIVITY LIMITATIONS lifting, standing, squatting, and locomotion level  PARTICIPATION LIMITATIONS: cleaning and hiking  PERSONAL FACTORS Time since onset of injury/illness/exacerbation and 1-2 comorbidities: R TKA, Hx of DVT/PE  are also affecting patient's functional outcome.   REHAB POTENTIAL: Good  CLINICAL DECISION MAKING: Stable/uncomplicated  EVALUATION COMPLEXITY: Low   GOALS:  SHORT TERM GOALS: Target date: 08/01/2021  Pt will tolerate aquatic therapy without adverse effects for improved core strength, pain, and tolerance to activity Baseline: Goal status:  GOAL MET  2.  Pt will demo improved lumbar AROM to be [facility] with reduced pain for improved mobility. Baseline:  Goal status: ONGOING  3.  Pt will report at least a 25% improvement in ambulation and standing. Baseline:  Goal status: GOAL MET    LONG TERM GOALS: Target date:  09/20/2021  Pt will demo improved core strength by progressing with aquatic exercises and core exercises on land without adverse effects and improved hip ext strength to 4/5 bilat for improved tolerance with and performance of daily activities and functional mobility.  Baseline:  Goal status: PROGRESSING  2.  Pt will be able to perform extended community ambulation without increased pain.  Baseline:  Goal status: ONGOING  3.  Pt will be able to hike.  Baseline:  Goal status: NOT MET  4.  Pt will be able to perform her normal standing activities and household chores without significant pain or limitation.  Baseline:  Goal status: NOT MET   PLAN: PT FREQUENCY: 1-2x/wk  PT DURATION: 6 weeks  PLANNED INTERVENTIONS: Therapeutic exercises, Therapeutic activity, Neuromuscular re-education, Balance  training, Gait training, Patient/Family education, Joint mobilization, Stair training, Aquatic Therapy, Electrical stimulation, Spinal mobilization, Cryotherapy, Moist heat, Taping, Ultrasound, Manual therapy, and Re-evaluation.  PLAN FOR NEXT SESSION: Cont with aquatic and land based therapy with increased focus on aquatic therapy.  STW to L glute.  Core strengthening.  Selinda Michaels III PT, DPT 09/13/21 2:58 PM

## 2021-09-17 ENCOUNTER — Ambulatory Visit (HOSPITAL_BASED_OUTPATIENT_CLINIC_OR_DEPARTMENT_OTHER): Payer: Medicare Other | Admitting: Physical Therapy

## 2021-09-19 ENCOUNTER — Ambulatory Visit (HOSPITAL_BASED_OUTPATIENT_CLINIC_OR_DEPARTMENT_OTHER): Payer: Medicare Other | Admitting: Physical Therapy

## 2021-09-19 ENCOUNTER — Encounter (HOSPITAL_BASED_OUTPATIENT_CLINIC_OR_DEPARTMENT_OTHER): Payer: Self-pay | Admitting: Physical Therapy

## 2021-09-19 DIAGNOSIS — M6281 Muscle weakness (generalized): Secondary | ICD-10-CM

## 2021-09-19 DIAGNOSIS — M5459 Other low back pain: Secondary | ICD-10-CM

## 2021-09-19 DIAGNOSIS — M25552 Pain in left hip: Secondary | ICD-10-CM

## 2021-09-19 DIAGNOSIS — R262 Difficulty in walking, not elsewhere classified: Secondary | ICD-10-CM | POA: Diagnosis not present

## 2021-09-19 DIAGNOSIS — M545 Low back pain, unspecified: Secondary | ICD-10-CM

## 2021-09-19 NOTE — Therapy (Signed)
OUTPATIENT PHYSICAL THERAPY TREATMENT     Patient Name: Isabel Garcia MRN: [id number] DOB:11/10/1955, 66 y.o., female Today's Date: 09/19/2021   PT End of Session - 09/19/21 1659     Visit Number 12    Number of Visits 20    Date for PT Re-Evaluation 09/20/21    Authorization Type UHC MCR    PT Start Time 3762    PT Stop Time 8315    PT Time Calculation (min) 41 min    Activity Tolerance Patient tolerated treatment well;No increased pain    Behavior During Therapy WFL for tasks assessed/performed                 Past Medical History:  Diagnosis Date   Achilles tendinitis    Arthritis    Asthma    Clotting disorder ([place])    Diverticulitis    Diverticulosis    GERD (gastroesophageal reflux disease)    Heart murmur    as a child   History of blood clots    History of chicken pox    History of pulmonary embolus (PE) 2018   Hyperlipidemia    Hypertension    Liver cirrhosis secondary to NASH (nonalcoholic steatohepatitis) (HCC)    Osteoarthritis    Phlebitis    Plantar fasciitis    Sleep apnea    not wearing CPAP at this time   Vertigo    Past Surgical History:  Procedure Laterality Date   ABDOMINAL HYSTERECTOMY     bone spur removal     COLONOSCOPY     KNEE ARTHROSCOPY Right    KNEE ARTHROSCOPY Right    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   Procedure: TOTAL KNEE ARTHROPLASTY;  Surgeon: Paralee Cancel, MD;  Location: [facility];  Service: Orthopedics;  Laterality: Right;  70 mins   Patient Active Problem List   Diagnosis Date Noted   Somatic dysfunction of spine, sacral 08/22/2021   Seborrheic dermatitis 04/29/2021   S/P right TKA 06/07/2019   History of DVT (deep vein thrombosis) 04/26/2018   Current use of long term anticoagulation 04/26/2018   Chronic low back pain with left-sided sciatica 10/22/2017   Obesity (BMI 35.0-39.9 without comorbidity) 10/22/2017   History of pulmonary embolus (PE) 10/20/2017   GERD (gastroesophageal reflux disease)  09/08/2017   Cough 08/11/2017   Essential hypertension 08/11/2017   Obstructive sleep apnea 08/11/2017   Asthma 03/01/2017   DM2 (diabetes mellitus, type 2) ([place]) 03/01/2017   DVT (deep vein thrombosis) in pregnancy 03/01/2017   Pulmonary emboli ([place]) 03/01/2017   Venous thromboembolism (VTE) 03/01/2017   Syncope 02/27/2017    PCP: Agnes Lawrence, MD  REFERRING PROVIDER: Hulan Saas, MD  REFERRING DIAG: M54.50 (ICD-10-CM) - Lumbar spine pain   M25.552 (ICD-10-CM) - Left hip pain   Rationale for Evaluation and Treatment Rehabilitation  THERAPY DIAG:  Pain in left hip  Other low back pain  Muscle weakness (generalized)  Difficulty in walking, not elsewhere classified  Low back pain, unspecified back pain laterality, unspecified chronicity, unspecified whether sciatica present  ONSET DATE: April 2023 Exacerbation  SUBJECTIVE:  SUBJECTIVE STATEMENT: "I over did it yesterday.  I went shopping for 4 hours". She reports a lot of standing in lines.  She states her R knee swelled last night; iced last evening for relief.    PERTINENT HISTORY:  Hx of lumbar pain, R TKA in 2021, Hx of DVT/PE in 2018, OA  PAIN:  Are you having pain? Yes:  Intensity:  6/10 ,     6/10,    /   6/10 Location: L hip /   Lumbar /  R knee Type:  Constant, Dull ache   PRECAUTIONS: Other: R TKA, Hx of DVT/PE  WEIGHT BEARING RESTRICTIONS No  FALLS:  Has patient fallen in last 6 months? No    OCCUPATION: Pt is retired  PLOF: Independent; Pt was able to perform her daily activities and functional mobility including ambulation without significant lumbar pain.  PATIENT GOALS reduce pain, loosen muscles, improve walking   OBJECTIVE:   DIAGNOSTIC FINDINGS:  Lumbar x rays (In Epic): IMPRESSION: 1.   Levoscoliosis of the thoracolumbar spine apex L1-L2.   2. Mild multilevel disc space height loss with large left eccentric bridging osteophytes, particularly bulky at L3-L4, with smaller anterior bridging osteophytes.   3. Lumbar disc and neural foraminal pathology may be further evaluated by MRI if indicated by neurologically localizing signs and symptoms.  Pelvis x ray (In Epic): IMPRESSION: 1. Mild, symmetric bilateral acetabular osteophytosis with preserved superior joint spaces.   2. No displaced fracture or dislocation of the pelvis or bilateral proximal femurs seen in single frontal view.  TODAY'S TREATMENT    Aquatic Therapy: Pt seen for aquatic therapy today.  Treatment took place in water 4 ft 4 in depth and 3.5 inch depth at the [employer] pool. Temp of water was 90-104.  Pt entered/exited the pool via stairs independently with bilat rail.   In 104 -  runner's gastroc stretch and soleus stretches x 2 each  In 90 -Walking forward and bwd x 4 laps - high knee marching forward/ backward - side step with big squat and arms abdct add x 2 laps - stork stance with hip ER/IR x 10 each  - Seated L/R piriformis stretch 2x20 sec  -Standing quad stretch with foot on bench 2x20 sec bilat LE - Standing hamstring and adductor stretches with LE on bench x 20 sec each - return to forward / backward   -straddling yellow noodle with yellow hand buoys;  suspended jumping jack legs; cross country skii - 2 rounds - L side stretch holding wall with R hand   Pt requires the buoyancy of water for active exercises with buoyancy supported for strengthening and AROM exercises. Hydrostatic pressure also supports joints by unweighting joint load by at least 50 % in 3-4 feet depth water and 80% in chest to neck deep water. Water will provide assistance with movement using the current and laminar flow while the buoyancy reduces weight bearing. Pt requires the viscosity of the water for  resistance with strengthening exercises.   PATIENT EDUCATION:  Education details: POC, relevant anatomy, exercise form, and rationale of exercises.  PT answered Pt's questions.  Person educated: Patient Education method: Explanation, Demonstration, Verbal cues, and Handouts Education comprehension: verbalized understanding, returned demonstration, verbal cues required, and needs further education   HOME EXERCISE PROGRAM: Access Code: [email or web address] URL: [email or web address] Date: 07/12/2021 Prepared by: Ronny Flurry  Exercises - Supine Piriformis Stretch with Foot on Ground  - 2 x daily - 7 x weekly - 2-3 reps -  20-30 seconds hold - Standing Glute Med Mobilization with Small Ball on Wall  - 7 x weekly  ASSESSMENT:  CLINICAL IMPRESSION: Session focused on recovering from high level pain associated with long periods of standing yesterday. Pt reports improved lumbar sx's with seated L piriformis stretch.  Pt responded well to Rx stating she felt better after Rx and pain improving 2 points.  Pt should benefit from cont skilled PT services to address ongoing goals and to restore desired level of function.   Pt progressing gradually towards remaining LTGs.    OBJECTIVE IMPAIRMENTS Abnormal gait, decreased activity tolerance, decreased mobility, difficulty walking, decreased ROM, decreased strength, hypomobility, increased muscle spasms, and pain.   ACTIVITY LIMITATIONS lifting, standing, squatting, and locomotion level  PARTICIPATION LIMITATIONS: cleaning and hiking  PERSONAL FACTORS Time since onset of injury/illness/exacerbation and 1-2 comorbidities: R TKA, Hx of DVT/PE  are also affecting patient's functional outcome.   REHAB POTENTIAL: Good  CLINICAL DECISION MAKING: Stable/uncomplicated  EVALUATION COMPLEXITY: Low   GOALS:  SHORT TERM GOALS: Target date: 08/01/2021  Pt will tolerate aquatic therapy without adverse effects for improved core strength, pain, and  tolerance to activity Baseline: Goal status:  GOAL MET  2.  Pt will demo improved lumbar AROM to be [facility] with reduced pain for improved mobility. Baseline:  Goal status: ONGOING  3.  Pt will report at least a 25% improvement in ambulation and standing. Baseline:  Goal status: GOAL MET    LONG TERM GOALS: Target date:  09/20/2021  Pt will demo improved core strength by progressing with aquatic exercises and core exercises on land without adverse effects and improved hip ext strength to 4/5 bilat for improved tolerance with and performance of daily activities and functional mobility.  Baseline:  Goal status: PROGRESSING  2.  Pt will be able to perform extended community ambulation without increased pain.  Baseline:  Goal status: ONGOING  3.  Pt will be able to hike.  Baseline:  Goal status: NOT MET  4.  Pt will be able to perform her normal standing activities and household chores without significant pain or limitation.  Baseline:  Goal status: NOT MET   PLAN: PT FREQUENCY: 1-2x/wk  PT DURATION: 6 weeks  PLANNED INTERVENTIONS: Therapeutic exercises, Therapeutic activity, Neuromuscular re-education, Balance training, Gait training, Patient/Family education, Joint mobilization, Stair training, Aquatic Therapy, Electrical stimulation, Spinal mobilization, Cryotherapy, Moist heat, Taping, Ultrasound, Manual therapy, and Re-evaluation.  PLAN FOR NEXT SESSION: Cont with aquatic therapy:.  Core strengthening. Recert next visit and begin d/c planning.   Kerin Perna, PTA 09/19/21 5:58 PM

## 2021-09-24 ENCOUNTER — Encounter (HOSPITAL_BASED_OUTPATIENT_CLINIC_OR_DEPARTMENT_OTHER): Payer: Self-pay | Admitting: Physical Therapy

## 2021-09-24 ENCOUNTER — Ambulatory Visit (HOSPITAL_BASED_OUTPATIENT_CLINIC_OR_DEPARTMENT_OTHER): Payer: Medicare Other | Admitting: Physical Therapy

## 2021-09-24 DIAGNOSIS — R262 Difficulty in walking, not elsewhere classified: Secondary | ICD-10-CM | POA: Diagnosis not present

## 2021-09-24 DIAGNOSIS — M6281 Muscle weakness (generalized): Secondary | ICD-10-CM | POA: Diagnosis not present

## 2021-09-24 DIAGNOSIS — M5459 Other low back pain: Secondary | ICD-10-CM | POA: Diagnosis not present

## 2021-09-24 DIAGNOSIS — M25552 Pain in left hip: Secondary | ICD-10-CM | POA: Diagnosis not present

## 2021-09-24 DIAGNOSIS — M545 Low back pain, unspecified: Secondary | ICD-10-CM | POA: Diagnosis not present

## 2021-09-24 NOTE — Therapy (Signed)
OUTPATIENT PHYSICAL THERAPY TREATMENT     Patient Name: Isabel Garcia MRN: [id number] DOB:1955-12-17, 66 y.o., female Today's Date: 09/24/2021   PT End of Session - 09/24/21 1820     Visit Number 13    Number of Visits 20    Authorization Type UHC MCR    PT Start Time 1400    PT Stop Time 9826    PT Time Calculation (min) 45 min    Activity Tolerance Patient tolerated treatment well    Behavior During Therapy WFL for tasks assessed/performed                  Past Medical History:  Diagnosis Date   Achilles tendinitis    Arthritis    Asthma    Clotting disorder ([place])    Diverticulitis    Diverticulosis    GERD (gastroesophageal reflux disease)    Heart murmur    as a child   History of blood clots    History of chicken pox    History of pulmonary embolus (PE) 2018   Hyperlipidemia    Hypertension    Liver cirrhosis secondary to NASH (nonalcoholic steatohepatitis) (HCC)    Osteoarthritis    Phlebitis    Plantar fasciitis    Sleep apnea    not wearing CPAP at this time   Vertigo    Past Surgical History:  Procedure Laterality Date   ABDOMINAL HYSTERECTOMY     bone spur removal     COLONOSCOPY     KNEE ARTHROSCOPY Right    KNEE ARTHROSCOPY Right    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   Procedure: TOTAL KNEE ARTHROPLASTY;  Surgeon: Paralee Cancel, MD;  Location: [facility];  Service: Orthopedics;  Laterality: Right;  70 mins   Patient Active Problem List   Diagnosis Date Noted   Somatic dysfunction of spine, sacral 08/22/2021   Seborrheic dermatitis 04/29/2021   S/P right TKA 06/07/2019   History of DVT (deep vein thrombosis) 04/26/2018   Current use of long term anticoagulation 04/26/2018   Chronic low back pain with left-sided sciatica 10/22/2017   Obesity (BMI 35.0-39.9 without comorbidity) 10/22/2017   History of pulmonary embolus (PE) 10/20/2017   GERD (gastroesophageal reflux disease) 09/08/2017   Cough 08/11/2017   Essential hypertension  08/11/2017   Obstructive sleep apnea 08/11/2017   Asthma 03/01/2017   DM2 (diabetes mellitus, type 2) ([place]) 03/01/2017   DVT (deep vein thrombosis) in pregnancy 03/01/2017   Pulmonary emboli ([place]) 03/01/2017   Venous thromboembolism (VTE) 03/01/2017   Syncope 02/27/2017    PCP: Agnes Lawrence, MD  REFERRING PROVIDER: Hulan Saas, MD  REFERRING DIAG: M54.50 (ICD-10-CM) - Lumbar spine pain   M25.552 (ICD-10-CM) - Left hip pain   Rationale for Evaluation and Treatment Rehabilitation  THERAPY DIAG:  Pain in left hip  Other low back pain  Muscle weakness (generalized)  Difficulty in walking, not elsewhere classified  ONSET DATE: April 2023 Exacerbation  SUBJECTIVE:  SUBJECTIVE STATEMENT: Pt reports she still gets tight in R knee with any length of walking.  She still has pain with mopping/vacuuming in her house.    PERTINENT HISTORY:  Hx of lumbar pain, R TKA in 2021, Hx of DVT/PE in 2018, OA  PAIN:  Are you having pain? Yes:  Intensity:  2/10 ,     2-3/10,    /   3/10 Location: L hip /   Lumbar /  R knee Type:  Constant, Dull ache   PRECAUTIONS: Other: R TKA, Hx of DVT/PE  WEIGHT BEARING RESTRICTIONS No  FALLS:  Has patient fallen in last 6 months? No    OCCUPATION: Pt is retired  PLOF: Independent; Pt was able to perform her daily activities and functional mobility including ambulation without significant lumbar pain.  PATIENT GOALS reduce pain, loosen muscles, improve walking   OBJECTIVE:   DIAGNOSTIC FINDINGS:  Lumbar x rays (In Epic): IMPRESSION: 1.  Levoscoliosis of the thoracolumbar spine apex L1-L2.   2. Mild multilevel disc space height loss with large left eccentric bridging osteophytes, particularly bulky at L3-L4, with smaller anterior bridging  osteophytes.   3. Lumbar disc and neural foraminal pathology may be further evaluated by MRI if indicated by neurologically localizing signs and symptoms.  Pelvis x ray (In Epic): IMPRESSION: 1. Mild, symmetric bilateral acetabular osteophytosis with preserved superior joint spaces.   2. No displaced fracture or dislocation of the pelvis or bilateral proximal femurs seen in single frontal view. LUMBAR ROM:    Active  A/PROM  eval 09/24/21  Flexion WFL with pain WNL  Extension 50% with pain 75% with minimal pain  Right lateral flexion 60% with pain 60% with tightness  Left lateral flexion 40% with pain  40% with pain  Right rotation WFL 75%   Left rotation 75% with minimal pain 50% with tightness    (Blank rows = not tested)   LOWER EXTREMITY ROM:      Active  Right eval Left eval  Hip flexion      Hip extension [facility]  Hip abduction      Hip adduction      Hip internal rotation      Hip external rotation      Knee flexion      Knee extension [facility]  Ankle dorsiflexion      Ankle plantarflexion      Ankle inversion      Ankle eversion       (Blank rows = not tested)   LOWER EXTREMITY MMT:     MMT Right eval Left eval Right 8/22 Left 8/22  Hip flexion 5/5 5/5    Hip extension 3+/5 with pain 3+/5 with pain 4+/5 5-/5  Hip abduction        Hip adduction        Hip internal rotation        Hip external rotation 4+/5 5/5 4/5 4+/5  Knee flexion 4/5 5/5 4/5 5/5  Knee extension 4/5 5/5 4+/5 5/5  Ankle dorsiflexion        Ankle plantarflexion        Ankle inversion        Ankle eversion         (Blank rows = not tested)    TODAY'S TREATMENT  Measurements taken prior to treatment.   Aquatic Therapy: Pt seen for aquatic therapy today.  Treatment took place in water 4 ft 4 in depth and 3.5 inch depth at the  [facility] pool. Temp of water was 90-104.  Pt entered/exited the pool via stairs independently with bilat rail.   On pool side bench, seated  quad stretch x 15sec x 2 RLE  In 90 -Walking forward and bwd x 4 laps - high knee marching forward/ backward - side step with big squat and arms abdct add x 2 laps, rainbow hand buoys with arm abdct/ add  - walking forward lunges with rainbow hand buoys under water at side - stork stance with hip ER/IR x 10 each; heel raises x 15 - rainbow hand buoys for balance  -straddling yellow noodle with yellow hand buoys;  cycling; suspended jumping jack legs; cross country skii - 2 rounds - Seated L/R piriformis and glute stretch 2x20 sec  - Standing hamstring and adductor stretches with LE on bench x 20 sec each - L side stretch holding wall with R hand  - reviewed form for vacumming/ sweeping - calf stretch with heels off of step  Pt requires the buoyancy of water for active exercises with buoyancy supported for strengthening and AROM exercises. Hydrostatic pressure also supports joints by unweighting joint load by at least 50 % in 3-4 feet depth water and 80% in chest to neck deep water. Water will provide assistance with movement using the current and laminar flow while the buoyancy reduces weight bearing. Pt requires the viscosity of the water for resistance with strengthening exercises.   PATIENT EDUCATION:  Education details: POC, relevant anatomy, exercise form, and rationale of exercises. Person educated: Patient Education method: Explanation, Demonstration, Verbal cues,  Education comprehension: verbalized understanding, returned demonstration, verbal cues required,   HOME EXERCISE PROGRAM: Access Code: [email or web address] URL: [email or web address] Date: 07/12/2021 Prepared by: Ronny Flurry  Exercises - Supine Piriformis Stretch with Foot on Ground  - 2 x daily - 7 x weekly - 2-3 reps - 20-30 seconds hold - Standing Glute Med Mobilization with Small Ball on Wall  - 7 x weekly  ASSESSMENT:  CLINICAL IMPRESSION: Pt reporting overall improvement of pain symptoms and mobility  since starting therapy.  She demonstrated improved hip strength; lumbar ROM remains limited.    Pt responded well to session, reporting decreased tightness and pain.Pt has partially met her goals and should benefit from cont skilled PT services to address ongoing goals and to restore desired level of function.   Anticipate d/c after next few visits.     OBJECTIVE IMPAIRMENTS Abnormal gait, decreased activity tolerance, decreased mobility, difficulty walking, decreased ROM, decreased strength, hypomobility, increased muscle spasms, and pain.   ACTIVITY LIMITATIONS lifting, standing, squatting, and locomotion level  PARTICIPATION LIMITATIONS: cleaning and hiking  PERSONAL FACTORS Time since onset of injury/illness/exacerbation and 1-2 comorbidities: R TKA, Hx of DVT/PE  are also affecting patient's functional outcome.   REHAB POTENTIAL: Good  CLINICAL DECISION MAKING: Stable/uncomplicated  EVALUATION COMPLEXITY: Low   GOALS:  SHORT TERM GOALS: Target date: 08/01/2021  Pt will tolerate aquatic therapy without adverse effects for improved core strength, pain, and tolerance to activity Baseline: Goal status:  GOAL MET  2.  Pt will demo improved lumbar AROM to be [facility] with reduced pain for improved mobility. Baseline:  Goal status: ONGOING  3.  Pt will report at least a 25% improvement in ambulation and standing. Baseline:  Goal status: GOAL MET    LONG TERM GOALS: Target date:  09/20/2021  Pt will demo improved core strength by progressing with aquatic exercises and core exercises on land without adverse effects and improved hip ext  strength to 4/5 bilat for improved tolerance with and performance of daily activities and functional mobility.  Baseline:  Goal status: Achieved - 09/24/21  2.  Pt will be able to perform extended community ambulation without increased pain.  Baseline:  Goal status: Partially met   3.  Pt will be able to hike.  Baseline:  Goal status: Deferred  -09/24/21  (pt declined to attempt)  4.  Pt will be able to perform her normal standing activities and household chores without significant pain or limitation.  Baseline: has increased pain with mopping Goal status: Ongoing    PLAN: PT FREQUENCY: 1-2x/wk  PT DURATION: 6 weeks  PLANNED INTERVENTIONS: Therapeutic exercises, Therapeutic activity, Neuromuscular re-education, Balance training, Gait training, Patient/Family education, Joint mobilization, Stair training, Aquatic Therapy, Electrical stimulation, Spinal mobilization, Cryotherapy, Moist heat, Taping, Ultrasound, Manual therapy, and Re-evaluation.  PLAN FOR NEXT SESSION: begin d/c planning; finalize HEP   Kerin Perna, PTA 09/24/21 6:20 PM

## 2021-09-26 NOTE — Addendum Note (Signed)
Addended by: Marijo Sanes on: 09/26/2021 11:55 AM   Modules accepted: Orders

## 2021-09-27 ENCOUNTER — Encounter (HOSPITAL_BASED_OUTPATIENT_CLINIC_OR_DEPARTMENT_OTHER): Payer: Self-pay | Admitting: Physical Therapy

## 2021-09-27 ENCOUNTER — Ambulatory Visit (HOSPITAL_BASED_OUTPATIENT_CLINIC_OR_DEPARTMENT_OTHER): Payer: Medicare Other | Admitting: Physical Therapy

## 2021-09-27 DIAGNOSIS — R262 Difficulty in walking, not elsewhere classified: Secondary | ICD-10-CM | POA: Diagnosis not present

## 2021-09-27 DIAGNOSIS — M25552 Pain in left hip: Secondary | ICD-10-CM

## 2021-09-27 DIAGNOSIS — M545 Low back pain, unspecified: Secondary | ICD-10-CM | POA: Diagnosis not present

## 2021-09-27 DIAGNOSIS — M6281 Muscle weakness (generalized): Secondary | ICD-10-CM

## 2021-09-27 DIAGNOSIS — M5459 Other low back pain: Secondary | ICD-10-CM | POA: Diagnosis not present

## 2021-09-27 NOTE — Therapy (Signed)
OUTPATIENT PHYSICAL THERAPY TREATMENT     Patient Name: Isabel Garcia MRN: [id number] DOB:January 09, 1956, 66 y.o., female Today's Date: 09/27/2021   PT End of Session - 09/27/21 1313     Visit Number 14    Number of Visits 20    Date for PT Re-Evaluation 10/08/21    Authorization Type UHC MCR    PT Start Time 1255    PT Stop Time 1335    PT Time Calculation (min) 40 min    Activity Tolerance Patient tolerated treatment well    Behavior During Therapy WFL for tasks assessed/performed                  Past Medical History:  Diagnosis Date   Achilles tendinitis    Arthritis    Asthma    Clotting disorder ([place])    Diverticulitis    Diverticulosis    GERD (gastroesophageal reflux disease)    Heart murmur    as a child   History of blood clots    History of chicken pox    History of pulmonary embolus (PE) 2018   Hyperlipidemia    Hypertension    Liver cirrhosis secondary to NASH (nonalcoholic steatohepatitis) (HCC)    Osteoarthritis    Phlebitis    Plantar fasciitis    Sleep apnea    not wearing CPAP at this time   Vertigo    Past Surgical History:  Procedure Laterality Date   ABDOMINAL HYSTERECTOMY     bone spur removal     COLONOSCOPY     KNEE ARTHROSCOPY Right    KNEE ARTHROSCOPY Right    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   Procedure: TOTAL KNEE ARTHROPLASTY;  Surgeon: Paralee Cancel, MD;  Location: [facility];  Service: Orthopedics;  Laterality: Right;  70 mins   Patient Active Problem List   Diagnosis Date Noted   Somatic dysfunction of spine, sacral 08/22/2021   Seborrheic dermatitis 04/29/2021   S/P right TKA 06/07/2019   History of DVT (deep vein thrombosis) 04/26/2018   Current use of long term anticoagulation 04/26/2018   Chronic low back pain with left-sided sciatica 10/22/2017   Obesity (BMI 35.0-39.9 without comorbidity) 10/22/2017   History of pulmonary embolus (PE) 10/20/2017   GERD (gastroesophageal reflux disease) 09/08/2017   Cough  08/11/2017   Essential hypertension 08/11/2017   Obstructive sleep apnea 08/11/2017   Asthma 03/01/2017   DM2 (diabetes mellitus, type 2) ([place]) 03/01/2017   DVT (deep vein thrombosis) in pregnancy 03/01/2017   Pulmonary emboli ([place]) 03/01/2017   Venous thromboembolism (VTE) 03/01/2017   Syncope 02/27/2017    PCP: Agnes Lawrence, MD  REFERRING PROVIDER: Hulan Saas, MD  REFERRING DIAG: M54.50 (ICD-10-CM) - Lumbar spine pain   M25.552 (ICD-10-CM) - Left hip pain   Rationale for Evaluation and Treatment Rehabilitation  THERAPY DIAG:  Pain in left hip  Other low back pain  Muscle weakness (generalized)  Difficulty in walking, not elsewhere classified  ONSET DATE: April 2023 Exacerbation  SUBJECTIVE:  SUBJECTIVE STATEMENT: Pt reports she woke up with increased pain.  Used the ball to massage hip and this has helped.     PERTINENT HISTORY:  Hx of lumbar pain, R TKA in 2021, Hx of DVT/PE in 2018, OA  PAIN:  Are you having pain? Yes:  Intensity:  4/10 ,     4/10,    Location: L hip /   Lumbar / Type:  Constant, Dull ache   PRECAUTIONS: Other: R TKA, Hx of DVT/PE  WEIGHT BEARING RESTRICTIONS No  FALLS:  Has patient fallen in last 6 months? No    OCCUPATION: Pt is retired  PLOF: Independent; Pt was able to perform her daily activities and functional mobility including ambulation without significant lumbar pain.  PATIENT GOALS reduce pain, loosen muscles, improve walking   OBJECTIVE:   DIAGNOSTIC FINDINGS:  Lumbar x rays (In Epic): IMPRESSION: 1.  Levoscoliosis of the thoracolumbar spine apex L1-L2.   2. Mild multilevel disc space height loss with large left eccentric bridging osteophytes, particularly bulky at L3-L4, with smaller anterior bridging osteophytes.   3.  Lumbar disc and neural foraminal pathology may be further evaluated by MRI if indicated by neurologically localizing signs and symptoms.  Pelvis x ray (In Epic): IMPRESSION: 1. Mild, symmetric bilateral acetabular osteophytosis with preserved superior joint spaces.   2. No displaced fracture or dislocation of the pelvis or bilateral proximal femurs seen in single frontal view. LUMBAR ROM:    Active  A/PROM  eval 09/24/21  Flexion WFL with pain WNL  Extension 50% with pain 75% with minimal pain  Right lateral flexion 60% with pain 60% with tightness  Left lateral flexion 40% with pain  40% with pain  Right rotation WFL 75%   Left rotation 75% with minimal pain 50% with tightness    (Blank rows = not tested)   LOWER EXTREMITY ROM:      Active  Right eval Left eval  Hip flexion      Hip extension [facility]  Hip abduction      Hip adduction      Hip internal rotation      Hip external rotation      Knee flexion      Knee extension [facility]  Ankle dorsiflexion      Ankle plantarflexion      Ankle inversion      Ankle eversion       (Blank rows = not tested)   LOWER EXTREMITY MMT:     MMT Right eval Left eval Right 8/22 Left 8/22  Hip flexion 5/5 5/5    Hip extension 3+/5 with pain 3+/5 with pain 4+/5 5-/5  Hip abduction        Hip adduction        Hip internal rotation        Hip external rotation 4+/5 5/5 4/5 4+/5  Knee flexion 4/5 5/5 4/5 5/5  Knee extension 4/5 5/5 4+/5 5/5  Ankle dorsiflexion        Ankle plantarflexion        Ankle inversion        Ankle eversion         (Blank rows = not tested)    TODAY'S TREATMENT  Measurements taken prior to treatment.   Aquatic Therapy: Pt seen for aquatic therapy today.  Treatment took place in water 4 ft 4 in depth and 3.5 inch depth at the [facility] pool. Temp of water was 90.  Pt entered/exited  the pool via stairs independently with bilat rail.    -Walking forward and bwd x 4 laps - high knee  marching forward/ backward - side step with big squat and arms abdct add x 2 laps, rainbow hand buoys with arm abdct/ add  - Backward lunges; forward lunges; walking forward lunges  - stork stance with hip ER/IR x 10 each; rainbow hand buoys for balance - plank at wall then at bench in water with hip ext; with mountain climbers; fire hydrants - holding wall:  side to side lunge - L/R hamstring stretch; L/R quad stretch; L stretch; fig 4 piriformis; L side stretch  -straddling yellow noodle with yellow hand buoys;  cycling; suspended jumping jack legs; cross country skii - 2 rounds  Pt requires the buoyancy of water for active exercises with buoyancy supported for strengthening and AROM exercises. Hydrostatic pressure also supports joints by unweighting joint load by at least 50 % in 3-4 feet depth water and 80% in chest to neck deep water. Water will provide assistance with movement using the current and laminar flow while the buoyancy reduces weight bearing. Pt requires the viscosity of the water for resistance with strengthening exercises.   PATIENT EDUCATION:  Education details: Financial trader Person educated: Patient Education method: Consulting civil engineer, Media planner, Verbal cues,  Education comprehension: verbalized understanding, returned demonstration, verbal cues required,   HOME EXERCISE PROGRAM: Access Code: [email or web address] URL: [email or web address] Date: 07/12/2021 Prepared by: Ronny Flurry  Exercises - Supine Piriformis Stretch with Foot on Ground  - 2 x daily - 7 x weekly - 2-3 reps - 20-30 seconds hold - Standing Glute Med Mobilization with Small Ball on Wall  - 7 x weekly  ASSESSMENT:  CLINICAL IMPRESSION: Pt responded well to session, reporting decreased tightness and pain.Pt has partially met her goals and should benefit from cont skilled PT services to address ongoing goals and to restore desired level of function.   Anticipate d/c after next few  visits.     OBJECTIVE IMPAIRMENTS Abnormal gait, decreased activity tolerance, decreased mobility, difficulty walking, decreased ROM, decreased strength, hypomobility, increased muscle spasms, and pain.   ACTIVITY LIMITATIONS lifting, standing, squatting, and locomotion level  PARTICIPATION LIMITATIONS: cleaning and hiking  PERSONAL FACTORS Time since onset of injury/illness/exacerbation and 1-2 comorbidities: R TKA, Hx of DVT/PE  are also affecting patient's functional outcome.   REHAB POTENTIAL: Good  CLINICAL DECISION MAKING: Stable/uncomplicated  EVALUATION COMPLEXITY: Low   GOALS:  SHORT TERM GOALS: Target date: 08/01/2021  Pt will tolerate aquatic therapy without adverse effects for improved core strength, pain, and tolerance to activity Baseline: Goal status:  GOAL MET  2.  Pt will demo improved lumbar AROM to be [facility] with reduced pain for improved mobility. Baseline:  Goal status: ONGOING  3.  Pt will report at least a 25% improvement in ambulation and standing. Baseline:  Goal status: GOAL MET    LONG TERM GOALS: Target date:  09/20/2021  Pt will demo improved core strength by progressing with aquatic exercises and core exercises on land without adverse effects and improved hip ext strength to 4/5 bilat for improved tolerance with and performance of daily activities and functional mobility.  Baseline:  Goal status: Achieved - 09/24/21  2.  Pt will be able to perform extended community ambulation without increased pain.  Baseline:  Goal status: Partially met   3.  Pt will be able to hike.  Baseline:  Goal status: Deferred -09/24/21  (pt declined to attempt)  4.  Pt will be able to perform her normal standing activities and household chores without significant pain or limitation.  Baseline: has increased pain with mopping Goal status: Ongoing    PLAN: PT FREQUENCY: 1-2x/wk  PT DURATION: 2-3 weeks  PLANNED INTERVENTIONS: Therapeutic exercises, Therapeutic  activity, Neuromuscular re-education, Balance training, Gait training, Patient/Family education, Joint mobilization, Stair training, Aquatic Therapy, Electrical stimulation, Spinal mobilization, Cryotherapy, Moist heat, Taping, Ultrasound, Manual therapy, and Re-evaluation.  PLAN FOR NEXT SESSION:  review and modify HEP as needed.   Kerin Perna, PTA 09/27/21 1:37 PM

## 2021-10-01 ENCOUNTER — Encounter (HOSPITAL_BASED_OUTPATIENT_CLINIC_OR_DEPARTMENT_OTHER): Payer: Self-pay | Admitting: Physical Therapy

## 2021-10-01 ENCOUNTER — Ambulatory Visit (HOSPITAL_BASED_OUTPATIENT_CLINIC_OR_DEPARTMENT_OTHER): Payer: Medicare Other | Admitting: Physical Therapy

## 2021-10-01 DIAGNOSIS — R262 Difficulty in walking, not elsewhere classified: Secondary | ICD-10-CM | POA: Diagnosis not present

## 2021-10-01 DIAGNOSIS — M25552 Pain in left hip: Secondary | ICD-10-CM | POA: Diagnosis not present

## 2021-10-01 DIAGNOSIS — M5459 Other low back pain: Secondary | ICD-10-CM

## 2021-10-01 DIAGNOSIS — M6281 Muscle weakness (generalized): Secondary | ICD-10-CM | POA: Diagnosis not present

## 2021-10-01 DIAGNOSIS — M545 Low back pain, unspecified: Secondary | ICD-10-CM | POA: Diagnosis not present

## 2021-10-01 NOTE — Therapy (Addendum)
OUTPATIENT PHYSICAL THERAPY TREATMENT     Patient Name: Isabel Garcia MRN: [id number] DOB:[birth year], 66 y.o., female Today's Date: 10/01/2021   PT End of Session - 10/01/21 1418     Visit Number 15    Number of Visits 20    Date for PT Re-Evaluation 10/08/21    Authorization Type [employer] MCR    PT Start Time 1400    PT Stop Time 1440    PT Time Calculation (min) 40 min    Activity Tolerance Patient tolerated treatment well    Behavior During Therapy WFL for tasks assessed/performed                  Past Medical History:  Diagnosis Date   Achilles tendinitis    Arthritis    Asthma    Clotting disorder ([place])    Diverticulitis    Diverticulosis    GERD (gastroesophageal reflux disease)    Heart murmur    as a child   History of blood clots    History of chicken pox    History of pulmonary embolus (PE) 2018   Hyperlipidemia    Hypertension    Liver cirrhosis secondary to NASH (nonalcoholic steatohepatitis) (HCC)    Osteoarthritis    Phlebitis    Plantar fasciitis    Sleep apnea    not wearing CPAP at this time   Vertigo    Past Surgical History:  Procedure Laterality Date   ABDOMINAL HYSTERECTOMY     bone spur removal     COLONOSCOPY     KNEE ARTHROSCOPY Right    KNEE ARTHROSCOPY Right    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   Procedure: TOTAL KNEE ARTHROPLASTY;  Surgeon: Paralee Cancel, MD;  Location: [facility];  Service: Orthopedics;  Laterality: Right;  70 mins   Patient Active Problem List   Diagnosis Date Noted   Somatic dysfunction of spine, sacral 08/22/2021   Seborrheic dermatitis 04/29/2021   S/P right TKA 06/07/2019   History of DVT (deep vein thrombosis) 04/26/2018   Current use of long term anticoagulation 04/26/2018   Chronic low back pain with left-sided sciatica 10/22/2017   Obesity (BMI 35.0-39.9 without comorbidity) 10/22/2017   History of pulmonary embolus (PE) 10/20/2017   GERD (gastroesophageal reflux disease) 09/08/2017   Cough  08/11/2017   Essential hypertension 08/11/2017   Obstructive sleep apnea 08/11/2017   Asthma 03/01/2017   DM2 (diabetes mellitus, type 2) ([place]) 03/01/2017   DVT (deep vein thrombosis) in pregnancy 03/01/2017   Pulmonary emboli ([place]) 03/01/2017   Venous thromboembolism (VTE) 03/01/2017   Syncope 02/27/2017    PCP: Agnes Lawrence, MD  REFERRING PROVIDER: Hulan Saas, MD  REFERRING DIAG: M54.50 (ICD-10-CM) - Lumbar spine pain   M25.552 (ICD-10-CM) - Left hip pain   Rationale for Evaluation and Treatment Rehabilitation  THERAPY DIAG:  Pain in left hip  Other low back pain  Muscle weakness (generalized)  ONSET DATE: April 2023 Exacerbation  SUBJECTIVE:  SUBJECTIVE STATEMENT: Pt reports she woke up with increased pain.  Might be the weather.  Stiff in hip, knee and Achilles.    PERTINENT HISTORY:  Hx of lumbar pain, R TKA in 2021, Hx of DVT/PE in 2018, OA  PAIN:  Are you having pain? Yes:  Intensity:  4/10 ,     4/10,   4/10 Location: L hip /   Lumbar / R knee  Type:  Constant, Dull ache   PRECAUTIONS: Other: R TKA, Hx of DVT/PE  WEIGHT BEARING RESTRICTIONS No  FALLS:  Has patient fallen in last 6 months? No    OCCUPATION: Pt is retired  PLOF: Independent; Pt was able to perform her daily activities and functional mobility including ambulation without significant lumbar pain.  PATIENT GOALS reduce pain, loosen muscles, improve walking   OBJECTIVE:   DIAGNOSTIC FINDINGS:  Lumbar x rays (In Epic): IMPRESSION: 1.  Levoscoliosis of the thoracolumbar spine apex L1-L2.   2. Mild multilevel disc space height loss with large left eccentric bridging osteophytes, particularly bulky at L3-L4, with smaller anterior bridging osteophytes.   3. Lumbar disc and neural foraminal  pathology may be further evaluated by MRI if indicated by neurologically localizing signs and symptoms.  Pelvis x ray (In Epic): IMPRESSION: 1. Mild, symmetric bilateral acetabular osteophytosis with preserved superior joint spaces.   2. No displaced fracture or dislocation of the pelvis or bilateral proximal femurs seen in single frontal view. LUMBAR ROM:    Active  A/PROM  eval 09/24/21  Flexion WFL with pain WNL  Extension 50% with pain 75% with minimal pain  Right lateral flexion 60% with pain 60% with tightness  Left lateral flexion 40% with pain  40% with pain  Right rotation WFL 75%   Left rotation 75% with minimal pain 50% with tightness    (Blank rows = not tested)   LOWER EXTREMITY ROM:      Active  Right eval Left eval  Hip flexion      Hip extension [facility]  Hip abduction      Hip adduction      Hip internal rotation      Hip external rotation      Knee flexion      Knee extension [facility]  Ankle dorsiflexion      Ankle plantarflexion      Ankle inversion      Ankle eversion       (Blank rows = not tested)   LOWER EXTREMITY MMT:     MMT Right eval Left eval Right 8/22 Left 8/22  Hip flexion 5/5 5/5    Hip extension 3+/5 with pain 3+/5 with pain 4+/5 5-/5  Hip abduction        Hip adduction        Hip internal rotation        Hip external rotation 4+/5 5/5 4/5 4+/5  Knee flexion 4/5 5/5 4/5 5/5  Knee extension 4/5 5/5 4+/5 5/5  Ankle dorsiflexion        Ankle plantarflexion        Ankle inversion        Ankle eversion         (Blank rows = not tested)    TODAY'S TREATMENT   Aquatic Therapy: Pt seen for aquatic therapy today.  Treatment took place in water 4 ft 4 in depth and 3.5 inch depth at the [employer] pool. Temp of water was 90.  Pt entered/exited the pool via  stairs independently with bilat rail.    -Walking forward and bwd x 4 laps - Side to side lunge; hamstring stretch; quad stretch; piriformis stretch  -straddling  yellow noodle with yellow hand buoys;  cycling; suspended jumping jack legs; cross country skii - 2 rounds - high knee marching forward/ backward - side step with big squat and arms abdct add  - curtsy lunge; then curtsy followed by high knee - stork stance with hip ER/IR x 10 each; rainbow hand buoys for balance - forward hurdle legs; then with added arms and core engaged - forward walking lunges - plank at wall then at bench in water with hip ext; with mountain climbers; fire hydrants - repeated L/R hamstring stretch;Lt side stretch x 5sec x 2; seated on 4th step piriformis stretch  Pt requires the buoyancy of water for active exercises with buoyancy supported for strengthening and AROM exercises. Hydrostatic pressure also supports joints by unweighting joint load by at least 50 % in 3-4 feet depth water and 80% in chest to neck deep water. Water will provide assistance with movement using the current and laminar flow while the buoyancy reduces weight bearing. Pt requires the viscosity of the water for resistance with strengthening exercises.   PATIENT EDUCATION:  Education details: reviewed laminated aquatic HEP Person educated: Patient Education method: Consulting civil engineer, Demonstration, Verbal cues,  Education comprehension: verbalized understanding, returned demonstration, verbal cues required,   HOME EXERCISE PROGRAM: Access Code: [email or web address] URL: [email or web address]   ASSESSMENT:  CLINICAL IMPRESSION: Pt reported some increased pain in Lt hip with forward walking hurdles; reduced with change in exercise.  Overall she reported reduction of stiffness at end of session.  She should benefit from cont skilled PT services to address ongoing goals and to restore desired level of function.   Anticipate d/c after next visit.     OBJECTIVE IMPAIRMENTS Abnormal gait, decreased activity tolerance, decreased mobility, difficulty walking, decreased ROM, decreased strength, hypomobility,  increased muscle spasms, and pain.   ACTIVITY LIMITATIONS lifting, standing, squatting, and locomotion level  PARTICIPATION LIMITATIONS: cleaning and hiking  PERSONAL FACTORS Time since onset of injury/illness/exacerbation and 1-2 comorbidities: R TKA, Hx of DVT/PE  are also affecting patient's functional outcome.   REHAB POTENTIAL: Good  CLINICAL DECISION MAKING: Stable/uncomplicated  EVALUATION COMPLEXITY: Low   GOALS:  SHORT TERM GOALS: Target date: 08/01/2021  Pt will tolerate aquatic therapy without adverse effects for improved core strength, pain, and tolerance to activity Baseline: Goal status:  GOAL MET  2.  Pt will demo improved lumbar AROM to be [facility] with reduced pain for improved mobility. Baseline:  Goal status: ONGOING  3.  Pt will report at least a 25% improvement in ambulation and standing. Baseline:  Goal status: GOAL MET    LONG TERM GOALS: Target date:  09/20/2021  Pt will demo improved core strength by progressing with aquatic exercises and core exercises on land without adverse effects and improved hip ext strength to 4/5 bilat for improved tolerance with and performance of daily activities and functional mobility.  Baseline:  Goal status: Achieved - 09/24/21  2.  Pt will be able to perform extended community ambulation without increased pain.  Baseline:  Goal status: Partially met   3.  Pt will be able to hike.  Baseline:  Goal status: Deferred -09/24/21  (pt declined to attempt)  4.  Pt will be able to perform her normal standing activities and household chores without significant pain or limitation.  Baseline: has increased pain with mopping Goal  status: Ongoing    PLAN: PT FREQUENCY: 1-2x/wk  PT DURATION: 2-3 weeks  PLANNED INTERVENTIONS: Therapeutic exercises, Therapeutic activity, Neuromuscular re-education, Balance training, Gait training, Patient/Family education, Joint mobilization, Stair training, Aquatic Therapy, Electrical  stimulation, Spinal mobilization, Cryotherapy, Moist heat, Taping, Ultrasound, Manual therapy, and Re-evaluation.  PLAN FOR NEXT SESSION:  finalize HEP and d/c.   Kerin Perna, PTA 10/01/21 2:51 PM  PHYSICAL THERAPY DISCHARGE SUMMARY  Visits from Start of Care: 15  Current functional level related to goals / functional outcomes: See above   Remaining deficits: See above   Education / Equipment:    Pt was seen in PT from 07/11/21 to 10/01/21.  The plan was to finalize aquatic HEP and discharge the following visit on 10/03/21.  She cancelled that appointment due to being sick and did not reschedule.  Pt will be considered discharged at this time.  For goal progress and functional status, please see above.   Selinda Michaels III PT, DPT 03/31/22 9:25 AM

## 2021-10-03 ENCOUNTER — Ambulatory Visit (HOSPITAL_BASED_OUTPATIENT_CLINIC_OR_DEPARTMENT_OTHER): Payer: Medicare Other | Admitting: Physical Therapy

## 2021-10-09 ENCOUNTER — Encounter: Payer: Self-pay | Admitting: Internal Medicine

## 2021-10-09 ENCOUNTER — Ambulatory Visit (INDEPENDENT_AMBULATORY_CARE_PROVIDER_SITE_OTHER): Payer: Medicare Other | Admitting: Internal Medicine

## 2021-10-09 VITALS — BP 122/84 | HR 66 | Resp 18 | Ht 66.5 in | Wt 205.2 lb

## 2021-10-09 DIAGNOSIS — J209 Acute bronchitis, unspecified: Secondary | ICD-10-CM

## 2021-10-09 DIAGNOSIS — K529 Noninfective gastroenteritis and colitis, unspecified: Secondary | ICD-10-CM | POA: Diagnosis not present

## 2021-10-09 DIAGNOSIS — J4521 Mild intermittent asthma with (acute) exacerbation: Secondary | ICD-10-CM

## 2021-10-09 MED ORDER — METHYLPREDNISOLONE 4 MG PO TBPK: ORAL_TABLET | ORAL | 0 refills | Status: DC

## 2021-10-09 MED ORDER — BENZONATATE 100 MG PO CAPS
100.0000 mg | ORAL_CAPSULE | Freq: Two times a day (BID) | ORAL | 1 refills | Status: DC | PRN
Start: 2021-10-09 — End: 2023-05-14

## 2021-10-09 MED ORDER — FLUTICASONE-SALMETEROL 250-50 MCG/ACT IN AEPB: 1.0000 | INHALATION_SPRAY | Freq: Two times a day (BID) | RESPIRATORY_TRACT | 5 refills | Status: DC

## 2021-10-09 MED ORDER — AZITHROMYCIN 250 MG PO TABS: ORAL_TABLET | ORAL | 0 refills | Status: AC

## 2021-10-09 NOTE — Assessment & Plan Note (Signed)
Check urine Legionella as she has respiratory and GI symptoms Started empiric azithromycin Medrol Dosepak considering her asthma Tessalon as needed for cough

## 2021-10-09 NOTE — Patient Instructions (Addendum)
Please start taking Azithromycin and Prednisone as prescribed.  Please use Advair regularly and Albuterol as needed for shortness of breath or wheezing.  Please take Tessalon as needed for cough.

## 2021-10-09 NOTE — Progress Notes (Addendum)
Acute Office Visit  Subjective:    Patient ID: Isabel Garcia, female    DOB: Apr 04, 1955, 66 y.o.   MRN: [id number]  Chief Complaint  Patient presents with   Cough    Patient has had diarrhea throwing up cough congestion runny nose sob fever since 10-01-21 took at home covid test 10-07-21 neg     HPI Patient is in today for complaint of cough, runny nose, nasal congestion and dyspnea for the last 1 week.  She also reports nausea and watery diarrhea, which has resolved now.  She had fever and chills as well.  Her home COVID test was negative.  She has tried TheraFlu with no relief.  Denies any abdominal pain, melena or hematochezia.  Denies hemoptysis.  Of note, she has history of asthma and has been using only albuterol as needed and has run out of her Advair.  Past Medical History:  Diagnosis Date   Achilles tendinitis    Arthritis    Asthma    Clotting disorder ([place])    Diverticulitis    Diverticulosis    GERD (gastroesophageal reflux disease)    Heart murmur    as a child   History of blood clots    History of chicken pox    History of pulmonary embolus (PE) 2018   Hyperlipidemia    Hypertension    Liver cirrhosis secondary to NASH (nonalcoholic steatohepatitis) (HCC)    Osteoarthritis    Phlebitis    Plantar fasciitis    Sleep apnea    not wearing CPAP at this time   Vertigo     Past Surgical History:  Procedure Laterality Date   ABDOMINAL HYSTERECTOMY     bone spur removal     COLONOSCOPY     KNEE ARTHROSCOPY Right    KNEE ARTHROSCOPY Right    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   Procedure: TOTAL KNEE ARTHROPLASTY;  Surgeon: Paralee Cancel, MD;  Location: [facility];  Service: Orthopedics;  Laterality: Right;  82 mins    Family History  Problem Relation Age of Onset   Arthritis Mother    Hypertension Mother    Hypertension Father    Heart attack Father    Early death Father    Asthma Sister    Heart attack Brother    Early death Brother    Hypertension  Brother    Stroke Maternal Grandmother    Heart attack Maternal Grandfather    Heart attack Paternal Grandfather    Early death Paternal Grandfather    Colon cancer Neg Hx    Stomach cancer Neg Hx    Pancreatic cancer Neg Hx    Esophageal cancer Neg Hx    Rectal cancer Neg Hx     Social History   Socioeconomic History   Marital status: Divorced    Spouse name: Not on file   Number of children: 1   Years of education: Not on file   Highest education level: Not on file  Occupational History   Occupation: Disabled  Tobacco Use   Smoking status: Never   Smokeless tobacco: Never  Vaping Use   Vaping Use: Never used  Substance and Sexual Activity   Alcohol use: Not Currently   Drug use: No   Sexual activity: Not Currently  Other Topics Concern   Not on file  Social History Narrative   Not on file   Social Determinants of Health   Financial Resource Strain: Low Risk  (06/03/2021)   Overall  Financial Resource Strain (CARDIA)    Difficulty of Paying Living Expenses: Not hard at all  Food Insecurity: No Food Insecurity (06/03/2021)   Hunger Vital Sign    Worried About Running Out of Food in the Last Year: Never true    Ran Out of Food in the Last Year: Never true  Transportation Needs: No Transportation Needs (06/03/2021)   PRAPARE - Hydrologist (Medical): No    Lack of Transportation (Non-Medical): No  Physical Activity: Sufficiently Active (06/03/2021)   Exercise Vital Sign    Days of Exercise per Week: 5 days    Minutes of Exercise per Session: 60 min  Stress: Not on file  Social Connections: Moderately Integrated (06/03/2021)   Social Connection and Isolation Panel [NHANES]    Frequency of Communication with Friends and Family: More than three times a week    Frequency of Social Gatherings with Friends and Family: More than three times a week    Attends Religious Services: More than 4 times per year    Active Member of [employer] or Organizations:  Yes    Attends Music therapist: More than 4 times per year    Marital Status: Divorced  Intimate Partner Violence: Not At Risk (06/03/2021)   Humiliation, Afraid, Rape, and Kick questionnaire    Fear of Current or Ex-Partner: No    Emotionally Abused: No    Physically Abused: No    Sexually Abused: No    Outpatient Medications Prior to Visit  Medication Sig Dispense Refill   albuterol (VENTOLIN HFA) 108 (90 Base) MCG/ACT inhaler Inhale 2 puffs into the lungs every 6 (six) hours as needed for wheezing or shortness of breath.      amLODipine (NORVASC) 10 MG tablet Take 1 tablet (10 mg total) by mouth daily. Take 1 tablet (10 mg total) daily for high blood pressure. 30 tablet 5   apixaban (ELIQUIS) 5 MG TABS tablet Take 1 tablet (5 mg total) by mouth 2 (two) times daily. 60 tablet 5   cholecalciferol (VITAMIN D3) 25 MCG (1000 UNIT) tablet Take 1,000 Units by mouth daily.     COD LIVER OIL PO Take 1 capsule by mouth daily.     cyclobenzaprine (FLEXERIL) 5 MG tablet Take 1 tablet (5 mg total) by mouth 2 (two) times daily as needed for muscle spasms. 30 tablet 1   fluticasone (FLONASE) 50 MCG/ACT nasal spray Place 2 sprays into both nostrils daily. 16 g 11   Iron-Vitamin C (IRON 100/C) 100-250 MG TABS Take by mouth.     lidocaine (LIDODERM) 5 % Place 1 patch onto the skin daily. Remove & Discard patch within 12 hours or as directed by MD 30 patch 0   omeprazole (PRILOSEC) 40 MG capsule Take 1 capsule (40 mg total) by mouth daily. 90 capsule 1   Zinc Sulfate (ZINC 15 PO) Take 1 capsule by mouth daily.     amoxicillin (AMOXIL) 250 MG capsule Take 250 mg by mouth 2 (two) times daily. (Patient not taking: Reported on 10/09/2021)     Fluticasone-Salmeterol (ADVAIR DISKUS) 250-50 MCG/DOSE AEPB Inhale 1 puff into the lungs 2 (two) times daily. 60 each 5   predniSONE (STERAPRED UNI-PAK 21 TAB) 10 MG (21) TBPK tablet Take as package instructions. (Patient not taking: Reported on 10/09/2021) 1 each  0   No facility-administered medications prior to visit.    No Known Allergies  Review of Systems  Constitutional:  Negative for chills and fever.  HENT:  Positive for congestion, sinus pressure and sore throat. Negative for sinus pain.   Eyes:  Negative for pain and discharge.  Respiratory:  Positive for cough and shortness of breath.   Cardiovascular:  Negative for chest pain and palpitations.  Gastrointestinal:  Positive for nausea. Negative for abdominal pain, diarrhea and vomiting.  Endocrine: Negative for polydipsia and polyuria.  Genitourinary:  Negative for dysuria and hematuria.  Musculoskeletal:  Positive for back pain. Negative for neck pain and neck stiffness.  Skin:  Negative for rash.  Neurological:  Negative for dizziness and weakness.  Psychiatric/Behavioral:  Negative for agitation and behavioral problems.        Objective:    Physical Exam Vitals reviewed.  Constitutional:      General: She is not in acute distress.    Appearance: She is not diaphoretic.  HENT:     Head: Normocephalic and atraumatic.     Nose: Nose normal.     Mouth/Throat:     Mouth: Mucous membranes are moist.  Eyes:     General: No scleral icterus.    Extraocular Movements: Extraocular movements intact.  Cardiovascular:     Rate and Rhythm: Normal rate and regular rhythm.     Heart sounds: Normal heart sounds. No murmur heard. Pulmonary:     Breath sounds: Wheezing (Mild, b/l) present. No rales.  Musculoskeletal:     Cervical back: Neck supple. No tenderness.     Right lower leg: No edema.     Left lower leg: No edema.  Skin:    General: Skin is warm.     Findings: No rash.     Comments: Patchy hair loss  Neurological:     General: No focal deficit present.     Mental Status: She is alert and oriented to person, place, and time.     Sensory: No sensory deficit.     Motor: No weakness.  Psychiatric:        Mood and Affect: Mood normal.        Behavior: Behavior normal.      BP 122/84 (BP Location: Right Arm, Patient Position: Sitting, Cuff Size: Normal)   Pulse 66   Resp 18   Ht 5' 6.5" (1.689 m)   Wt 205 lb 3.2 oz (93.1 kg)   SpO2 100%   BMI 32.62 kg/m  Wt Readings from Last 3 Encounters:  10/09/21 205 lb 3.2 oz (93.1 kg)  08/22/21 209 lb (94.8 kg)  07/04/21 205 lb (93 kg)        Assessment & Plan:   Problem List Items Addressed This Visit       Respiratory   Asthma    Could be asthma exacerbation Medrol Dosepak Added Advair as maintenance treatment Albuterol inhaler as needed for dyspnea or wheezing      Relevant Medications   fluticasone-salmeterol (ADVAIR DISKUS) 250-50 MCG/ACT AEPB   methylPREDNISolone (MEDROL DOSEPAK) 4 MG TBPK tablet   Acute bronchitis - Primary    Check urine Legionella as she has respiratory and GI symptoms Started empiric azithromycin Medrol Dosepak considering her asthma Tessalon as needed for cough      Relevant Medications   methylPREDNISolone (MEDROL DOSEPAK) 4 MG TBPK tablet   azithromycin (ZITHROMAX) 250 MG tablet   benzonatate (TESSALON) 100 MG capsule   Other Relevant Orders   L. Pneumophila Serogp 1 Ur Ag     Meds ordered this encounter  Medications   fluticasone-salmeterol (ADVAIR DISKUS) 250-50 MCG/ACT AEPB  Sig: Inhale 1 puff into the lungs in the morning and at bedtime.    Dispense:  60 each    Refill:  5   methylPREDNISolone (MEDROL DOSEPAK) 4 MG TBPK tablet    Sig: Take as package instructions.    Dispense:  1 each    Refill:  0   azithromycin (ZITHROMAX) 250 MG tablet    Sig: Take 2 tablets on day 1, then 1 tablet daily on days 2 through 5    Dispense:  6 tablet    Refill:  0   benzonatate (TESSALON) 100 MG capsule    Sig: Take 1 capsule (100 mg total) by mouth 2 (two) times daily as needed for cough.    Dispense:  30 capsule    Refill:  1     Avianna Moynahan Keith Rake, MD

## 2021-10-09 NOTE — Assessment & Plan Note (Signed)
Could be asthma exacerbation Medrol Dosepak Added Advair as maintenance treatment Albuterol inhaler as needed for dyspnea or wheezing

## 2021-10-11 ENCOUNTER — Telehealth: Payer: Self-pay | Admitting: Internal Medicine

## 2021-10-11 NOTE — Telephone Encounter (Signed)
Patient called in for lab results. Urine sample

## 2021-10-12 LAB — LEGIONELLA PNEUMOPHILA SEROGP 1 UR AG: L. pneumophila Serogp 1 Ur Ag: NEGATIVE

## 2021-10-14 NOTE — Telephone Encounter (Signed)
LVM for pt to call the office.

## 2021-10-22 NOTE — Progress Notes (Unsigned)
[facility] [place] Phone: [phone] Subjective:   Fontaine No, am serving as a scribe for Dr. Hulan Saas.  I'm seeing this patient by the request  of:  Lindell Spar, MD  CC: Back and neck pain follow-up  [id number]  Teliyah Royal Garcia is a 66 y.o. female coming in with complaint of back and neck pain. OMT on 08/22/2021. Patient states that she has back tightness due to coughing as she had bronchitis for 3 weeks.   Patient is also c/o tightness in the R knee and R achilles. History of being in a boot and having injections in achilles.    Medications patient has been prescribed: None  Taking:         Reviewed prior external information including notes and imaging from previsou exam, outside providers and external EMR if available.   As well as notes that were available from care everywhere and other healthcare systems.  Past medical history, social, surgical and family history all reviewed in electronic medical record.  No pertanent information unless stated regarding to the chief complaint.   Past Medical History:  Diagnosis Date   Achilles tendinitis    Arthritis    Asthma    Clotting disorder ([place])    Diverticulitis    Diverticulosis    GERD (gastroesophageal reflux disease)    Heart murmur    as a child   History of blood clots    History of chicken pox    History of pulmonary embolus (PE) 2018   Hyperlipidemia    Hypertension    Liver cirrhosis secondary to NASH (nonalcoholic steatohepatitis) (HCC)    Osteoarthritis    Phlebitis    Plantar fasciitis    Sleep apnea    not wearing CPAP at this time   Vertigo     No Known Allergies   Review of Systems:  No headache, visual changes, nausea, vomiting, diarrhea, constipation, dizziness, abdominal pain, skin rash, fevers, chills, night sweats, weight loss, swollen lymph nodes, body aches, joint swelling, chest pain, shortness of breath,  mood changes. POSITIVE muscle aches  Objective  Blood pressure (!) 118/90, pulse (!) 109, height 5' 6"  (1.676 m), SpO2 97 %.   General: No apparent distress alert and oriented x3 mood and affect normal, dressed appropriately.  HEENT: Pupils equal, extraocular movements intact  Respiratory: Patient's speak in full sentences and does not appear short of breath  Cardiovascular: No lower extremity edema, non tender, no erythema  Gait favoring right ankle  Right ankle exam does have some tightness noted in the posterior cord.  Tender to palpation over the posterior tibialis tendon.  Seems to be just inferior to the malleolus. MSK:  Back does have loss of lordosis  Pain over the left SI joint   Osteopathic findings  C2 flexed rotated and side bent right C6 flexed rotated and side bent left T3 extended rotated and side bent right inhaled rib T9 extended rotated and side bent left L2 flexed rotated and side bent right Sacrum left on left        Assessment and Plan:  Chronic low back pain with left-sided sciatica Chronic problem with mild exacerbation.  Likely compensating for patient's foot at the moment. Discussed icing regimen and home exercises, discussed which activities to do and which ones to avoid.  Increase activity slowly.  Follow-up again in 6 to 8 weeks.  Left tibialis posterior tendinitis Exercises, heel lift, discussed shoes.  Worsening pain consider injection.  Avoiding anti-inflammatories secondary to being on a chronic anticoagulation.    Nonallopathic problems  Decision today to treat with OMT was based on Physical Exam  After verbal consent patient was treated with HVLA, ME, FPR techniques in cervical, rib, thoracic, lumbar, and sacral  areas  Patient tolerated the procedure well with improvement in symptoms  Patient given exercises, stretches and lifestyle modifications  See medications in patient instructions if given  Patient will follow up in 4-8  weeks    The above documentation has been reviewed and is accurate and complete Lyndal Pulley, DO          Note: This dictation was prepared with Dragon dictation along with smaller phrase technology. Any transcriptional errors that result from this process are unintentional.

## 2021-10-23 ENCOUNTER — Encounter: Payer: Self-pay | Admitting: Family Medicine

## 2021-10-23 ENCOUNTER — Ambulatory Visit: Payer: Medicare Other | Admitting: Family Medicine

## 2021-10-23 VITALS — BP 118/90 | HR 109 | Ht 66.0 in

## 2021-10-23 DIAGNOSIS — M5442 Lumbago with sciatica, left side: Secondary | ICD-10-CM | POA: Diagnosis not present

## 2021-10-23 DIAGNOSIS — M76822 Posterior tibial tendinitis, left leg: Secondary | ICD-10-CM

## 2021-10-23 DIAGNOSIS — G8929 Other chronic pain: Secondary | ICD-10-CM | POA: Diagnosis not present

## 2021-10-23 DIAGNOSIS — M9904 Segmental and somatic dysfunction of sacral region: Secondary | ICD-10-CM | POA: Diagnosis not present

## 2021-10-23 DIAGNOSIS — M9901 Segmental and somatic dysfunction of cervical region: Secondary | ICD-10-CM | POA: Diagnosis not present

## 2021-10-23 DIAGNOSIS — M9908 Segmental and somatic dysfunction of rib cage: Secondary | ICD-10-CM | POA: Diagnosis not present

## 2021-10-23 DIAGNOSIS — M9902 Segmental and somatic dysfunction of thoracic region: Secondary | ICD-10-CM | POA: Diagnosis not present

## 2021-10-23 DIAGNOSIS — M9903 Segmental and somatic dysfunction of lumbar region: Secondary | ICD-10-CM

## 2021-10-23 NOTE — Assessment & Plan Note (Signed)
Exercises, heel lift, discussed shoes.  Worsening pain consider injection.  Avoiding anti-inflammatories secondary to being on a chronic anticoagulation.

## 2021-10-23 NOTE — Patient Instructions (Signed)
Posterior tib exercises Shoes with a heel Be careful at [facility] See me in 2 months

## 2021-10-23 NOTE — Assessment & Plan Note (Signed)
Chronic problem with mild exacerbation.  Likely compensating for patient's foot at the moment. Discussed icing regimen and home exercises, discussed which activities to do and which ones to avoid.  Increase activity slowly.  Follow-up again in 6 to 8 weeks.

## 2021-10-30 ENCOUNTER — Ambulatory Visit (INDEPENDENT_AMBULATORY_CARE_PROVIDER_SITE_OTHER): Payer: Medicare Other | Admitting: Internal Medicine

## 2021-10-30 ENCOUNTER — Encounter: Payer: Self-pay | Admitting: Internal Medicine

## 2021-10-30 VITALS — BP 128/88 | HR 71 | Resp 16 | Ht 66.5 in | Wt 211.8 lb

## 2021-10-30 DIAGNOSIS — Z7901 Long term (current) use of anticoagulants: Secondary | ICD-10-CM

## 2021-10-30 DIAGNOSIS — E782 Mixed hyperlipidemia: Secondary | ICD-10-CM | POA: Diagnosis not present

## 2021-10-30 DIAGNOSIS — Z86711 Personal history of pulmonary embolism: Secondary | ICD-10-CM | POA: Diagnosis not present

## 2021-10-30 DIAGNOSIS — Z23 Encounter for immunization: Secondary | ICD-10-CM | POA: Diagnosis not present

## 2021-10-30 DIAGNOSIS — J4520 Mild intermittent asthma, uncomplicated: Secondary | ICD-10-CM | POA: Diagnosis not present

## 2021-10-30 DIAGNOSIS — Z0001 Encounter for general adult medical examination with abnormal findings: Secondary | ICD-10-CM

## 2021-10-30 DIAGNOSIS — I10 Essential (primary) hypertension: Secondary | ICD-10-CM

## 2021-10-30 MED ORDER — APIXABAN 5 MG PO TABS: 5.0000 mg | ORAL_TABLET | Freq: Two times a day (BID) | ORAL | 5 refills | Status: DC

## 2021-10-30 MED ORDER — AMLODIPINE BESYLATE 10 MG PO TABS: 10.0000 mg | ORAL_TABLET | Freq: Every day | ORAL | 5 refills | Status: DC

## 2021-10-30 NOTE — Progress Notes (Signed)
Established Patient Office Visit  Subjective:  Patient ID: Isabel Garcia, female    DOB: 03-May-1955  Age: 66 y.o. MRN: [id number]  CC:  Chief Complaint  Patient presents with   Annual Exam    Annual exam pt still has cough and lower back pain near ribs    HPI Isabel Garcia is a 66 y.o. female with past medical history of HTN, OSA, GERD, PE/DVT and obesity who presents for annual physical.  HTN: BP is well-controlled. Takes medications regularly. Patient denies headache, dizziness, chest pain, dyspnea or palpitations.   She takes omeprazole as needed for GERD, but has not required it recently.  She denies any dysphagia or odynophagia currently.  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  She is supposed to be on indefinite AC, and was put on Eliquis.  She recently had an episode of acute bronchitis, which was treated with azithromycin and prednisone.  She is still complains of mild cough and back pain upon coughing.  Denies any dyspnea or wheezing currently.    Past Medical History:  Diagnosis Date   Achilles tendinitis    Arthritis    Asthma    Clotting disorder ([place])    Diverticulitis    Diverticulosis    GERD (gastroesophageal reflux disease)    Heart murmur    as a child   History of blood clots    History of chicken pox    History of pulmonary embolus (PE) 2018   Hyperlipidemia    Hypertension    Liver cirrhosis secondary to NASH (nonalcoholic steatohepatitis) (HCC)    Osteoarthritis    Phlebitis    Plantar fasciitis    Sleep apnea    not wearing CPAP at this time   Vertigo     Past Surgical History:  Procedure Laterality Date   ABDOMINAL HYSTERECTOMY     bone spur removal     COLONOSCOPY     KNEE ARTHROSCOPY Right    KNEE ARTHROSCOPY Right    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   Procedure: TOTAL KNEE ARTHROPLASTY;  Surgeon: Paralee Cancel, MD;  Location: [facility];  Service: Orthopedics;  Laterality: Right;   22 mins    Family History  Problem Relation Age of Onset   Arthritis Mother    Hypertension Mother    Hypertension Father    Heart attack Father    Early death Father    Asthma Sister    Heart attack Brother    Early death Brother    Hypertension Brother    Stroke Maternal Grandmother    Heart attack Maternal Grandfather    Heart attack Paternal Grandfather    Early death Paternal Grandfather    Colon cancer Neg Hx    Stomach cancer Neg Hx    Pancreatic cancer Neg Hx    Esophageal cancer Neg Hx    Rectal cancer Neg Hx     Social History   Socioeconomic History   Marital status: Divorced    Spouse name: Not on file   Number of children: 1   Years of education: Not on file   Highest education level: Not on file  Occupational History   Occupation: Disabled  Tobacco Use   Smoking status: Never   Smokeless tobacco: Never  Vaping Use   Vaping Use: Never used  Substance and Sexual Activity   Alcohol use: Not Currently   Drug use: No   Sexual activity: Not Currently  Other Topics Concern   Not on file  Social History Narrative   Not on file   Social Determinants of Health   Financial Resource Strain: Low Risk  (06/03/2021)   Overall Financial Resource Strain (CARDIA)    Difficulty of Paying Living Expenses: Not hard at all  Food Insecurity: No Food Insecurity (06/03/2021)   Hunger Vital Sign    Worried About Running Out of Food in the Last Year: Never true    [place] in the Last Year: Never true  Transportation Needs: No Transportation Needs (06/03/2021)   PRAPARE - Hydrologist (Medical): No    Lack of Transportation (Non-Medical): No  Physical Activity: Sufficiently Active (06/03/2021)   Exercise Vital Sign    Days of Exercise per Week: 5 days    Minutes of Exercise per Session: 60 min  Stress: Not on file  Social Connections: Moderately Integrated (06/03/2021)   Social Connection and Isolation Panel [NHANES]    Frequency of  Communication with Friends and Family: More than three times a week    Frequency of Social Gatherings with Friends and Family: More than three times a week    Attends Religious Services: More than 4 times per year    Active Member of [employer] or Organizations: Yes    Attends Music therapist: More than 4 times per year    Marital Status: Divorced  Intimate Partner Violence: Not At Risk (06/03/2021)   Humiliation, Afraid, Rape, and Kick questionnaire    Fear of Current or Ex-Partner: No    Emotionally Abused: No    Physically Abused: No    Sexually Abused: No    Outpatient Medications Prior to Visit  Medication Sig Dispense Refill   albuterol (VENTOLIN HFA) 108 (90 Base) MCG/ACT inhaler Inhale 2 puffs into the lungs every 6 (six) hours as needed for wheezing or shortness of breath.      benzonatate (TESSALON) 100 MG capsule Take 1 capsule (100 mg total) by mouth 2 (two) times daily as needed for cough. 30 capsule 1   cholecalciferol (VITAMIN D3) 25 MCG (1000 UNIT) tablet Take 1,000 Units by mouth daily.     COD LIVER OIL PO Take 1 capsule by mouth daily.     cyclobenzaprine (FLEXERIL) 5 MG tablet Take 1 tablet (5 mg total) by mouth 2 (two) times daily as needed for muscle spasms. 30 tablet 1   fluticasone (FLONASE) 50 MCG/ACT nasal spray Place 2 sprays into both nostrils daily. 16 g 11   fluticasone-salmeterol (ADVAIR DISKUS) 250-50 MCG/ACT AEPB Inhale 1 puff into the lungs in the morning and at bedtime. 60 each 5   Iron-Vitamin C (IRON 100/C) 100-250 MG TABS Take by mouth.     lidocaine (LIDODERM) 5 % Place 1 patch onto the skin daily. Remove & Discard patch within 12 hours or as directed by MD 30 patch 0   Zinc Sulfate (ZINC 15 PO) Take 1 capsule by mouth daily.     amLODipine (NORVASC) 10 MG tablet Take 1 tablet (10 mg total) by mouth daily. Take 1 tablet (10 mg total) daily for high blood pressure. 30 tablet 5   apixaban (ELIQUIS) 5 MG TABS tablet Take 1 tablet (5 mg total) by  mouth 2 (two) times daily. 60 tablet 5   methylPREDNISolone (MEDROL DOSEPAK) 4 MG TBPK tablet Take as package instructions. 1 each 0   omeprazole (PRILOSEC) 40 MG capsule Take 1 capsule (40 mg total) by mouth  daily. 90 capsule 1   No facility-administered medications prior to visit.    No Known Allergies  ROS Review of Systems  Constitutional:  Negative for chills and fever.  HENT:  Negative for congestion, sinus pressure, sinus pain and sore throat.   Eyes:  Negative for pain and discharge.  Respiratory:  Positive for cough and shortness of breath.   Cardiovascular:  Negative for chest pain and palpitations.  Gastrointestinal:  Negative for abdominal pain, diarrhea, nausea and vomiting.  Endocrine: Negative for polydipsia and polyuria.  Genitourinary:  Negative for dysuria and hematuria.  Musculoskeletal:  Positive for back pain. Negative for neck pain and neck stiffness.  Skin:  Negative for rash.  Neurological:  Negative for dizziness and weakness.  Psychiatric/Behavioral:  Negative for agitation and behavioral problems.       Objective:    Physical Exam Vitals reviewed.  Constitutional:      General: She is not in acute distress.    Appearance: She is not diaphoretic.  HENT:     Head: Normocephalic and atraumatic.     Nose: Nose normal.     Mouth/Throat:     Mouth: Mucous membranes are moist.  Eyes:     General: No scleral icterus.    Extraocular Movements: Extraocular movements intact.  Cardiovascular:     Rate and Rhythm: Normal rate and regular rhythm.     Heart sounds: Normal heart sounds. No murmur heard. Pulmonary:     Breath sounds: Normal breath sounds. No wheezing or rales.  Abdominal:     Palpations: Abdomen is soft.     Tenderness: There is no abdominal tenderness.  Musculoskeletal:     Cervical back: Neck supple. No tenderness.     Right lower leg: No edema.     Left lower leg: No edema.  Skin:    General: Skin is warm.     Findings: No rash.      Comments: Patchy hair loss  Neurological:     General: No focal deficit present.     Mental Status: She is alert and oriented to person, place, and time.     Sensory: No sensory deficit.     Motor: No weakness.  Psychiatric:        Mood and Affect: Mood normal.        Behavior: Behavior normal.     BP 128/88 (BP Location: Left Arm, Patient Position: Sitting, Cuff Size: Normal)   Pulse 71   Resp 16   Ht 5' 6.5" (1.689 m)   Wt 211 lb 12.8 oz (96.1 kg)   SpO2 100%   BMI 33.67 kg/m  Wt Readings from Last 3 Encounters:  10/30/21 211 lb 12.8 oz (96.1 kg)  10/09/21 205 lb 3.2 oz (93.1 kg)  08/22/21 209 lb (94.8 kg)    Lab Results  Component Value Date   TSH 0.91 08/22/2021   Lab Results  Component Value Date   WBC 4.3 10/30/2021   HGB 12.5 10/30/2021   HCT 36.4 10/30/2021   MCV 90 10/30/2021   PLT 214 10/30/2021   Lab Results  Component Value Date   NA 143 10/30/2021   K 4.4 10/30/2021   CO2 23 10/30/2021   GLUCOSE 81 10/30/2021   BUN 9 10/30/2021   CREATININE 0.77 10/30/2021   BILITOT 0.5 04/29/2021   ALKPHOS 83 04/29/2021   AST 25 04/29/2021   ALT 27 04/29/2021   PROT 7.3 04/29/2021   ALBUMIN 4.3 04/29/2021   CALCIUM 9.8 10/30/2021  ANIONGAP 9 06/08/2019   EGFR 86 10/30/2021   GFR 83.37 01/22/2021   Lab Results  Component Value Date   CHOL 192 04/29/2021   Lab Results  Component Value Date   HDL 52 04/29/2021   Lab Results  Component Value Date   LDLCALC 126 (H) 04/29/2021   Lab Results  Component Value Date   TRIG 78 04/29/2021   Lab Results  Component Value Date   CHOLHDL 3.7 04/29/2021   Lab Results  Component Value Date   HGBA1C 5.4 04/29/2021      Assessment & Plan:   Problem List Items Addressed This Visit       Cardiovascular and Mediastinum   Essential hypertension    BP Readings from Last 1 Encounters:  10/30/21 128/88  Well-controlled with amlodipine, chart review suggests noncompliance to treatment in the  past Counseled for compliance with the medications Advised DASH diet and moderate exercise/walking, at least 150 mins/week      Relevant Medications   amLODipine (NORVASC) 10 MG tablet   apixaban (ELIQUIS) 5 MG TABS tablet   Other Relevant Orders   Basic Metabolic Panel (BMET) (Completed)     Respiratory   Asthma    Recently had asthma exacerbation Had added Advair as maintenance treatment Albuterol inhaler as needed for dyspnea or wheezing Tessalon PRN for cough Back pain likely due to inflammatory cough        Other   History of pulmonary embolus (PE)    Chart review suggests history of unprovoked PE/DVT in the past Supposed to be on indefinite AC, on Eliquis Referred to heme-onc for further discussion      Relevant Medications   apixaban (ELIQUIS) 5 MG TABS tablet   Mixed hyperlipidemia   Relevant Medications   amLODipine (NORVASC) 10 MG tablet   apixaban (ELIQUIS) 5 MG TABS tablet   Encounter for general adult medical examination with abnormal findings - Primary    Physical exam as documented. Fasting blood tests ordered.      Other Visit Diagnoses     Chronic anticoagulation       Relevant Orders   CBC with Differential/Platelet (Completed)   Need for immunization against influenza       Relevant Orders   Flu Vaccine QUAD 73moIM (Fluarix, Fluzone & Alfiuria Quad PF) (Completed)       Meds ordered this encounter  Medications   amLODipine (NORVASC) 10 MG tablet    Sig: Take 1 tablet (10 mg total) by mouth daily. Take 1 tablet (10 mg total) daily for high blood pressure.    Dispense:  30 tablet    Refill:  5   apixaban (ELIQUIS) 5 MG TABS tablet    Sig: Take 1 tablet (5 mg total) by mouth 2 (two) times daily.    Dispense:  60 tablet    Refill:  5    Follow-up: Return in about 6 months (around 04/30/2022) for HTN and asthma.    RLindell Spar MD

## 2021-10-30 NOTE — Patient Instructions (Signed)
Please continue taking medications as prescribed.  Please continue to follow low salt diet and ambulate as tolerated.  Please consider getting Shingrix vaccine at your local pharmacy.

## 2021-10-31 ENCOUNTER — Telehealth: Payer: Self-pay | Admitting: Internal Medicine

## 2021-10-31 LAB — CBC WITH DIFFERENTIAL/PLATELET
Basophils Absolute: 0 10*3/uL (ref 0.0–0.2)
Basos: 1 %
EOS (ABSOLUTE): 0 10*3/uL (ref 0.0–0.4)
Eos: 0 %
Hematocrit: 36.4 % (ref 34.0–46.6)
Hemoglobin: 12.5 g/dL (ref 11.1–15.9)
Immature Grans (Abs): 0 10*3/uL (ref 0.0–0.1)
Immature Granulocytes: 0 %
Lymphocytes Absolute: 2.4 10*3/uL (ref 0.7–3.1)
Lymphs: 56 %
MCH: 30.9 pg (ref 26.6–33.0)
MCHC: 34.3 g/dL (ref 31.5–35.7)
MCV: 90 fL (ref 79–97)
Monocytes Absolute: 0.4 10*3/uL (ref 0.1–0.9)
Monocytes: 10 %
Neutrophils Absolute: 1.4 10*3/uL (ref 1.4–7.0)
Neutrophils: 33 %
Platelets: 214 10*3/uL (ref 150–450)
RBC: 4.05 x10E6/uL (ref 3.77–5.28)
RDW: 13 % (ref 11.7–15.4)
WBC: 4.3 10*3/uL (ref 3.4–10.8)

## 2021-10-31 LAB — BASIC METABOLIC PANEL
BUN/Creatinine Ratio: 12 (ref 12–28)
BUN: 9 mg/dL (ref 8–27)
CO2: 23 mmol/L (ref 20–29)
Calcium: 9.8 mg/dL (ref 8.7–10.3)
Chloride: 105 mmol/L (ref 96–106)
Creatinine, Ser: 0.77 mg/dL (ref 0.57–1.00)
Glucose: 81 mg/dL (ref 70–99)
Potassium: 4.4 mmol/L (ref 3.5–5.2)
Sodium: 143 mmol/L (ref 134–144)
eGFR: 86 mL/min/{1.73_m2} (ref >59)

## 2021-10-31 NOTE — Telephone Encounter (Signed)
Pt returning call for labs

## 2021-10-31 NOTE — Telephone Encounter (Signed)
Pt advised with verbal understanding  °

## 2021-11-01 NOTE — Assessment & Plan Note (Signed)
BP Readings from Last 1 Encounters:  10/30/21 128/88   Well-controlled with amlodipine, chart review suggests noncompliance to treatment in the past Counseled for compliance with the medications Advised DASH diet and moderate exercise/walking, at least 150 mins/week

## 2021-11-01 NOTE — Assessment & Plan Note (Signed)
Physical exam as documented. Fasting blood tests ordered.

## 2021-11-01 NOTE — Assessment & Plan Note (Addendum)
Recently had asthma exacerbation Had added Advair as maintenance treatment Albuterol inhaler as needed for dyspnea or wheezing Tessalon PRN for cough Back pain likely due to inflammatory cough

## 2021-11-01 NOTE — Assessment & Plan Note (Signed)
Chart review suggests history of unprovoked PE/DVT in the past Supposed to be on indefinite AC, on Eliquis Referred to heme-onc for further discussion

## 2021-12-04 NOTE — Progress Notes (Unsigned)
[facility] [place] Phone: [phone] Subjective:   Isabel Garcia, am serving as a scribe for Dr. Hulan Saas.  I'm seeing this patient by the request  of:  Lindell Spar, MD  CC: Back and neck pain follow-up  [id number]  Isabel Garcia is a 66 y.o. female coming in with complaint of back and neck pain. OMT 10/23/2021. L ankle pain f/u as well. Patient states sciatica has been trying to get her but she has been able to control it with her stretches same with her left groin pain. Had a fall last week fell on her right knee.   Medications patient has been prescribed: None  Taking:         Reviewed prior external information including notes and imaging from previsou exam, outside providers and external EMR if available.   As well as notes that were available from care everywhere and other healthcare systems.  Past medical history, social, surgical and family history all reviewed in electronic medical record.  No pertanent information unless stated regarding to the chief complaint.   Past Medical History:  Diagnosis Date   Achilles tendinitis    Arthritis    Asthma    Clotting disorder ([place])    Diverticulitis    Diverticulosis    GERD (gastroesophageal reflux disease)    Heart murmur    as a child   History of blood clots    History of chicken pox    History of pulmonary embolus (PE) 2018   Hyperlipidemia    Hypertension    Liver cirrhosis secondary to NASH (nonalcoholic steatohepatitis) (HCC)    Osteoarthritis    Phlebitis    Plantar fasciitis    Sleep apnea    not wearing CPAP at this time   Vertigo     No Known Allergies   Review of Systems:  No headache, visual changes, nausea, vomiting, diarrhea, constipation, dizziness, abdominal pain, skin rash, fevers, chills, night sweats, weight loss, swollen lymph nodes, body aches, joint swelling, chest pain, shortness of breath, mood changes.  POSITIVE muscle aches  Objective  Blood pressure 110/80, pulse 86, height 5' 6.5" (1.689 m), weight 211 lb (95.7 kg), SpO2 98 %.   General: No apparent distress alert and oriented x3 mood and affect normal, dressed appropriately.  HEENT: Pupils equal, extraocular movements intact  Respiratory: Patient's speak in full sentences and does not appear short of breath  Cardiovascular: No lower extremity edema, non tender, no erythema  Gait normal MSK:  Back low back exam does have some loss of lordosis.  Some tenderness to palpation in the paraspinal musculature.  Patient does have tightness in the thoracolumbar juncture  Osteopathic findings  C2 flexed rotated and side bent right C6 flexed rotated and side bent left T3 extended rotated and side bent right inhaled rib T11 extended rotated and side bent left L1 flexed rotated and side bent right Sacrum right on right       Assessment and Plan:  Chronic low back pain with left-sided sciatica Still responding Rotella relatively well to the conservative therapy.  Patient is responding to osteopathic manipulation at this moment.  We discussed that we do want to continue to try to improve core strength so this would not be necessary overall.  Discussed which activities to do and which ones to avoid.  Increase activity slowly.  Follow-up again in 2 months    Nonallopathic problems  Decision today to  treat with OMT was based on Physical Exam  After verbal consent patient was treated with HVLA, ME, FPR techniques in cervical, rib, thoracic, lumbar, and sacral  areas  Patient tolerated the procedure well with improvement in symptoms  Patient given exercises, stretches and lifestyle modifications  See medications in patient instructions if given  Patient will follow up in 4-8 weeks    The above documentation has been reviewed and is accurate and complete Lyndal Pulley, DO          Note: This dictation was prepared with Dragon  dictation along with smaller phrase technology. Any transcriptional errors that result from this process are unintentional.

## 2021-12-09 ENCOUNTER — Ambulatory Visit (INDEPENDENT_AMBULATORY_CARE_PROVIDER_SITE_OTHER): Payer: Medicare Other | Admitting: Family Medicine

## 2021-12-09 VITALS — BP 110/80 | HR 86 | Ht 66.5 in | Wt 211.0 lb

## 2021-12-09 DIAGNOSIS — M9904 Segmental and somatic dysfunction of sacral region: Secondary | ICD-10-CM

## 2021-12-09 DIAGNOSIS — M9908 Segmental and somatic dysfunction of rib cage: Secondary | ICD-10-CM | POA: Diagnosis not present

## 2021-12-09 DIAGNOSIS — G8929 Other chronic pain: Secondary | ICD-10-CM

## 2021-12-09 DIAGNOSIS — M5442 Lumbago with sciatica, left side: Secondary | ICD-10-CM | POA: Diagnosis not present

## 2021-12-09 DIAGNOSIS — M9901 Segmental and somatic dysfunction of cervical region: Secondary | ICD-10-CM

## 2021-12-09 DIAGNOSIS — M9903 Segmental and somatic dysfunction of lumbar region: Secondary | ICD-10-CM

## 2021-12-09 DIAGNOSIS — M9902 Segmental and somatic dysfunction of thoracic region: Secondary | ICD-10-CM | POA: Diagnosis not present
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
## 2021-12-10 NOTE — Assessment & Plan Note (Signed)
Still responding Rotella relatively well to the conservative therapy.  Patient is responding to osteopathic manipulation at this moment.  We discussed that we do want to continue to try to improve core strength so this would not be necessary overall.  Discussed which activities to do and which ones to avoid.  Increase activity slowly.  Follow-up again in 2 months

## 2022-02-05 NOTE — Progress Notes (Signed)
[name] [address] [place] [address] Phone: [phone] Subjective:   Isabel Garcia, am serving as a scribe for Dr. Antoine Primas.  I'm seeing this patient by the request  of:  Anabel Halon, MD  CC: Low back pain, chest discomfort  [id number]  Isabel Garcia is a 67 y.o. female coming in with complaint of back and neck pain. OMT 12/09/2021. Patient states that she continues to have a cough since November. Notices a dull ache with inspiration/expiration and coughing in the pec muscle. Also notes pain when she lays down in the same area but it feels tight. Denies any feelings of radiating symptoms, fluttering in her chest or dizziness.   Medications patient has been prescribed: None          Reviewed prior external information including notes and imaging from previsou exam, outside providers and external EMR if available.   As well as notes that were available from care everywhere and other healthcare systems.  Past medical history, social, surgical and family history all reviewed in electronic medical record.  No pertanent information unless stated regarding to the chief complaint.   Past Medical History:  Diagnosis Date   Achilles tendinitis    Arthritis    Asthma    Clotting disorder (HCC)    Diverticulitis    Diverticulosis    GERD (gastroesophageal reflux disease)    Heart murmur    as a child   History of blood clots    History of chicken pox    History of pulmonary embolus (PE) 2018   Hyperlipidemia    Hypertension    Liver cirrhosis secondary to NASH (nonalcoholic steatohepatitis) (HCC)    Osteoarthritis    Phlebitis    Plantar fasciitis    Sleep apnea    not wearing CPAP at this time   Vertigo     No Known Allergies   Review of Systems:  No headache, visual changes, nausea, vomiting, diarrhea, constipation, dizziness, abdominal pain, skin rash, fevers, chills, night sweats, weight loss, swollen lymph  nodes, body aches, joint swelling, chest pain, shortness of breath, mood changes. POSITIVE muscle aches  Objective  Blood pressure (!) 120/92, pulse 83, height 5' 6.5" (1.689 m), weight 218 lb (98.9 kg), SpO2 98 %.   General: No apparent distress alert and oriented x3 mood and affect normal, dressed appropriately.  HEENT: Pupils equal, extraocular movements intact  Respiratory: Patient's speak in full sentences and does not appear short of breath  Cardiovascular: No lower extremity edema, non tender, no erythema  Low back exam does have some loss lordosis.  Some tenderness to palpation in the paraspinal musculature.  Patient does have tightness even have some difficulty with laying down overweight.  Neurovascular intact in the lower extremities.  We are right Patient is able to take a deep breath.  Does have more discomfort she says with change of positions.   Osteopathic findings  C2 flexed rotated and side bent right C5 flexed rotated and side bent left T3 extended rotated and side bent right inhaled rib T8 extended rotated and side bent left L2 flexed rotated and side bent right Sacrum right on right     Assessment and Plan:  Chronic low back pain with left-sided sciatica Continues to have soreness of the upper back and lower back.  The patient has had a history of difficulty with a cough.  Could be contributing to this.  Will get chest x-ray to further  evaluate if anything else could be contributing to both of these problems.  We discussed with patient about icing regimen and home exercises.  Patient has had a history of a DVT previously but does not seem to have difficulty now.    Nonallopathic problems  Decision today to treat with OMT was based on Physical Exam  After verbal consent patient was treated with HVLA, ME, FPR techniques in cervical, rib, thoracic, lumbar, and sacral  areas  Patient tolerated the procedure well with improvement in symptoms  Patient given  exercises, stretches and lifestyle modifications  See medications in patient instructions if given  Patient will follow up in 4-8 weeks     The above documentation has been reviewed and is accurate and complete Judi Saa, DO         Note: This dictation was prepared with Dragon dictation along with smaller phrase technology. Any transcriptional errors that result from this process are unintentional.

## 2022-02-11 ENCOUNTER — Ambulatory Visit: Payer: Medicare Other | Admitting: Family Medicine

## 2022-02-11 ENCOUNTER — Encounter: Payer: Self-pay | Admitting: Family Medicine

## 2022-02-11 ENCOUNTER — Ambulatory Visit (INDEPENDENT_AMBULATORY_CARE_PROVIDER_SITE_OTHER): Payer: Medicare Other

## 2022-02-11 ENCOUNTER — Telehealth: Payer: Self-pay

## 2022-02-11 VITALS — BP 120/92 | HR 83 | Ht 66.5 in | Wt 218.0 lb

## 2022-02-11 DIAGNOSIS — M5442 Lumbago with sciatica, left side: Secondary | ICD-10-CM | POA: Diagnosis not present

## 2022-02-11 DIAGNOSIS — G8929 Other chronic pain: Secondary | ICD-10-CM | POA: Diagnosis not present

## 2022-02-11 DIAGNOSIS — E669 Obesity, unspecified: Secondary | ICD-10-CM | POA: Diagnosis not present

## 2022-02-11 DIAGNOSIS — M9904 Segmental and somatic dysfunction of sacral region: Secondary | ICD-10-CM

## 2022-02-11 DIAGNOSIS — M9903 Segmental and somatic dysfunction of lumbar region: Secondary | ICD-10-CM | POA: Diagnosis not present

## 2022-02-11 DIAGNOSIS — R079 Chest pain, unspecified: Secondary | ICD-10-CM | POA: Diagnosis not present

## 2022-02-11 DIAGNOSIS — M9902 Segmental and somatic dysfunction of thoracic region: Secondary | ICD-10-CM

## 2022-02-11 DIAGNOSIS — R053 Chronic cough: Secondary | ICD-10-CM

## 2022-02-11 DIAGNOSIS — M9908 Segmental and somatic dysfunction of rib cage: Secondary | ICD-10-CM | POA: Diagnosis not present

## 2022-02-11 DIAGNOSIS — Z7901 Long term (current) use of anticoagulants: Secondary | ICD-10-CM | POA: Diagnosis not present

## 2022-02-11 DIAGNOSIS — M9901 Segmental and somatic dysfunction of cervical region: Secondary | ICD-10-CM

## 2022-02-11 DIAGNOSIS — E6609 Other obesity due to excess calories: Secondary | ICD-10-CM

## 2022-02-11 DIAGNOSIS — R059 Cough, unspecified: Secondary | ICD-10-CM | POA: Diagnosis not present

## 2022-02-11 DIAGNOSIS — M542 Cervicalgia: Secondary | ICD-10-CM

## 2022-02-11 DIAGNOSIS — Z6834 Body mass index (BMI) 34.0-34.9, adult: Secondary | ICD-10-CM

## 2022-02-11 MED ORDER — MELOXICAM 15 MG PO TABS: 15.0000 mg | ORAL_TABLET | Freq: Every day | ORAL | 0 refills | Status: DC

## 2022-02-11 NOTE — Patient Instructions (Signed)
Referral to [facility] Nexium daily for 2 weeks If not better try meloxicam 15 mg daily for 2 weeks See me again in 6-8 weeks

## 2022-02-11 NOTE — Assessment & Plan Note (Signed)
Seen hematology and anemia studies Continue with indefinite discussed with patient to take the Eliquis and meloxicam.

## 2022-02-11 NOTE — Assessment & Plan Note (Signed)
Will refer the patient to metabolic medicine to help her with weight loss.  Patient has gained 7 pounds in the last 4 months.

## 2022-02-11 NOTE — Assessment & Plan Note (Addendum)
Continues to have soreness of the upper back and lower back.  The patient has had a history of difficulty with a cough.  Could be contributing to this.  Will get chest x-ray to further evaluate if anything else could be contributing to both of these problems.  We discussed with patient about icing regimen and home exercises.  Patient has had a history of a DVT previously but does not seem to have difficulty now.  Was prescribed eliquis but does not take it but will have patient not take the meloxicam, did call pharmacy and discontinued prescription.  Sent patient a MyChart message with this condition.  With her being prescribed Eliquis.

## 2022-02-11 NOTE — Assessment & Plan Note (Signed)
Chronic noted, discussed which activities to do more things to avoid.  Increase activity slowly.  Follow-up again in 6 to 8 weeks.

## 2022-02-11 NOTE — Assessment & Plan Note (Addendum)
Chronic cough, will get xray, if worsening discussed possible pulmonary or GI referral as well.  Not taking eliquis, awaiting hematology referral by primary seen in may though and recommended indefinite anticoagulation. Will get xrays

## 2022-02-11 NOTE — Telephone Encounter (Signed)
Left message for patient to check to see if she is taking Eliquis.

## 2022-02-12 MED ORDER — PREDNISONE 20 MG PO TABS: 40.0000 mg | ORAL_TABLET | Freq: Every day | ORAL | 0 refills | Status: DC

## 2022-02-12 NOTE — Telephone Encounter (Signed)
Patient called back in regards. She is taking Eliquis and asked what she is supposed to do if she can't take the Meloxicam?  Please advise.

## 2022-03-06 DIAGNOSIS — I10 Essential (primary) hypertension: Secondary | ICD-10-CM | POA: Diagnosis not present

## 2022-03-06 DIAGNOSIS — K760 Fatty (change of) liver, not elsewhere classified: Secondary | ICD-10-CM | POA: Diagnosis not present

## 2022-03-06 DIAGNOSIS — Z131 Encounter for screening for diabetes mellitus: Secondary | ICD-10-CM | POA: Diagnosis not present

## 2022-03-06 DIAGNOSIS — J45909 Unspecified asthma, uncomplicated: Secondary | ICD-10-CM | POA: Diagnosis not present

## 2022-03-06 DIAGNOSIS — E782 Mixed hyperlipidemia: Secondary | ICD-10-CM | POA: Diagnosis not present

## 2022-03-20 DIAGNOSIS — R7303 Prediabetes: Secondary | ICD-10-CM | POA: Diagnosis not present

## 2022-03-20 DIAGNOSIS — E782 Mixed hyperlipidemia: Secondary | ICD-10-CM | POA: Diagnosis not present

## 2022-03-20 DIAGNOSIS — K760 Fatty (change of) liver, not elsewhere classified: Secondary | ICD-10-CM | POA: Diagnosis not present

## 2022-03-20 DIAGNOSIS — I10 Essential (primary) hypertension: Secondary | ICD-10-CM | POA: Diagnosis not present

## 2022-03-20 NOTE — Progress Notes (Unsigned)
[facility] [address] [place] Phone: [phone] Subjective:   IVilma Meckel, am serving as a scribe for Dr. Hulan Saas.  I'm seeing this patient by the request  of:  Lindell Spar, MD  CC: neck and back pain   [id number]  Isabel Garcia is a 67 y.o. female coming in with complaint of back and neck pain. OMT on 02/11/2022. Patient states doing well. Recent swelling in feet and ankles. No other issues.  Medications patient has been prescribed: mobic prednisone  Taking:         Reviewed prior external information including notes and imaging from previsou exam, outside providers and external EMR if available.   As well as notes that were available from care everywhere and other healthcare systems.  Past medical history, social, surgical and family history all reviewed in electronic medical record.  No pertanent information unless stated regarding to the chief complaint.   Past Medical History:  Diagnosis Date   Achilles tendinitis    Arthritis    Asthma    Clotting disorder ([place])    Diverticulitis    Diverticulosis    GERD (gastroesophageal reflux disease)    Heart murmur    as a child   History of blood clots    History of chicken pox    History of pulmonary embolus (PE) 2018   Hyperlipidemia    Hypertension    Liver cirrhosis secondary to NASH (nonalcoholic steatohepatitis) (HCC)    Osteoarthritis    Phlebitis    Plantar fasciitis    Sleep apnea    not wearing CPAP at this time   Vertigo     No Known Allergies   Review of Systems:  No headache, visual changes, nausea, vomiting, diarrhea, constipation, dizziness, abdominal pain, skin rash, fevers, chills, night sweats, weight loss, swollen lymph nodes, body aches, joint swelling, chest pain, shortness of breath, mood changes. POSITIVE muscle aches  Objective  Blood pressure 128/82, pulse 93, height 5' 6"$  (1.676 m), weight 224 lb (101.6 kg), SpO2 98  %.   General: No apparent distress alert and oriented x3 mood and affect normal, dressed appropriately.  HEENT: Pupils equal, extraocular movements intact  Respiratory: Patient's speak in full sentences and does not appear short of breath  Cardiovascular: No lower extremity edema, non tender, no erythema  Gait MSK:  Back low back does have tightness noted.  Some tenderness to palpation also in the neck area today.  Does have some limited sidebending bilaterally. Lower back does have more tightness on the left sacroiliac joint.  Osteopathic findings  C6 flexed rotated and side bent left T3 extended rotated and side bent right inhaled rib T8 extended rotated and side bent left L2 flexed rotated and side bent right Sacrum left on left      Assessment and Plan:  Chronic low back pain with left-sided sciatica Chronic discomfort still noted over the left sacroiliac joint.  No tenderness to palpation noted.  Discussed home exercises and icing regimen.  Discussed core strengthening.  Discussed some of the aquatic therapy.  Will follow-up again in 6 to 8 weeks for further evaluation and treatment.    Nonallopathic problems  Decision today to treat with OMT was based on Physical Exam  After verbal consent patient was treated with HVLA, ME, FPR techniques in cervical, rib, thoracic, lumbar, and sacral  areas  Patient tolerated the procedure well with improvement in symptoms  Patient given exercises, stretches  and lifestyle modifications  See medications in patient instructions if given  Patient will follow up in 4-8 weeks    The above documentation has been reviewed and is accurate and complete Lyndal Pulley, DO          Note: This dictation was prepared with Dragon dictation along with smaller phrase technology. Any transcriptional errors that result from this process are unintentional.

## 2022-03-25 ENCOUNTER — Ambulatory Visit: Payer: Medicare Other | Admitting: Family Medicine

## 2022-03-25 ENCOUNTER — Encounter: Payer: Self-pay | Admitting: Family Medicine

## 2022-03-25 VITALS — BP 128/82 | HR 93 | Ht 66.0 in | Wt 224.0 lb

## 2022-03-25 DIAGNOSIS — M9904 Segmental and somatic dysfunction of sacral region: Secondary | ICD-10-CM

## 2022-03-25 DIAGNOSIS — M9902 Segmental and somatic dysfunction of thoracic region: Secondary | ICD-10-CM

## 2022-03-25 DIAGNOSIS — M9908 Segmental and somatic dysfunction of rib cage: Secondary | ICD-10-CM | POA: Diagnosis not present

## 2022-03-25 DIAGNOSIS — G8929 Other chronic pain: Secondary | ICD-10-CM

## 2022-03-25 DIAGNOSIS — M9901 Segmental and somatic dysfunction of cervical region: Secondary | ICD-10-CM

## 2022-03-25 DIAGNOSIS — M5442 Lumbago with sciatica, left side: Secondary | ICD-10-CM

## 2022-03-25 DIAGNOSIS — M9903 Segmental and somatic dysfunction of lumbar region: Secondary | ICD-10-CM

## 2022-03-25 NOTE — Patient Instructions (Signed)
Good to see you! Celebrate that sisters birthday Have great vacations See you again in April

## 2022-03-25 NOTE — Assessment & Plan Note (Signed)
Chronic discomfort still noted over the left sacroiliac joint.  No tenderness to palpation noted.  Discussed home exercises and icing regimen.  Discussed core strengthening.  Discussed some of the aquatic therapy.  Will follow-up again in 6 to 8 weeks for further evaluation and treatment.

## 2022-04-03 DIAGNOSIS — K760 Fatty (change of) liver, not elsewhere classified: Secondary | ICD-10-CM | POA: Diagnosis not present

## 2022-04-03 DIAGNOSIS — I10 Essential (primary) hypertension: Secondary | ICD-10-CM | POA: Diagnosis not present

## 2022-04-03 DIAGNOSIS — R7303 Prediabetes: Secondary | ICD-10-CM | POA: Diagnosis not present

## 2022-04-03 DIAGNOSIS — E782 Mixed hyperlipidemia: Secondary | ICD-10-CM | POA: Diagnosis not present

## 2022-04-03 DIAGNOSIS — E559 Vitamin D deficiency, unspecified: Secondary | ICD-10-CM | POA: Diagnosis not present

## 2022-04-10 ENCOUNTER — Encounter: Payer: Self-pay | Admitting: Internal Medicine

## 2022-04-10 ENCOUNTER — Ambulatory Visit (INDEPENDENT_AMBULATORY_CARE_PROVIDER_SITE_OTHER): Payer: Medicare Other | Admitting: Internal Medicine

## 2022-04-10 VITALS — BP 136/80 | HR 87 | Ht 66.0 in | Wt 218.6 lb

## 2022-04-10 DIAGNOSIS — Z86711 Personal history of pulmonary embolism: Secondary | ICD-10-CM | POA: Diagnosis not present

## 2022-04-10 DIAGNOSIS — Z7901 Long term (current) use of anticoagulants: Secondary | ICD-10-CM | POA: Diagnosis not present

## 2022-04-10 DIAGNOSIS — I10 Essential (primary) hypertension: Secondary | ICD-10-CM | POA: Diagnosis not present

## 2022-04-10 DIAGNOSIS — E559 Vitamin D deficiency, unspecified: Secondary | ICD-10-CM | POA: Diagnosis not present

## 2022-04-10 DIAGNOSIS — E782 Mixed hyperlipidemia: Secondary | ICD-10-CM | POA: Diagnosis not present

## 2022-04-10 DIAGNOSIS — J4520 Mild intermittent asthma, uncomplicated: Secondary | ICD-10-CM | POA: Diagnosis not present

## 2022-04-10 DIAGNOSIS — R7303 Prediabetes: Secondary | ICD-10-CM | POA: Insufficient documentation

## 2022-04-10 NOTE — Assessment & Plan Note (Signed)
Chart review suggests history of unprovoked PE/DVT in the past ?Supposed to be on indefinite AC, on Eliquis ?Referred to heme-onc for further discussion ?

## 2022-04-10 NOTE — Progress Notes (Signed)
Established Patient Office Visit  Subjective:  Patient ID: Isabel Garcia, female    DOB: 1956-01-07  Age: 67 y.o. MRN: [id number]  CC:  Chief Complaint  Patient presents with   Hypertension    Six month follow up for hypertension and asthma    HPI Isabel Garcia is a 67 y.o. female with past medical history of HTN, OSA, GERD, PE/DVT and obesity who presents for f/u of her chronic medical conditions.  HTN: BP is well-controlled. Takes medications regularly. Patient denies headache, dizziness, dyspnea or palpitations.  Asthma: She has chronic cough.  She uses albuterol inhaler.  As needed for dyspnea or wheezing.  Denies any recent fever or chills.  She has mild left upper chest wall pain, which is intermittent, has chest wall tenderness and pain is worse with deep breathing.  She takes omeprazole as needed for GERD, but has not required it recently.  She denies any dysphagia or odynophagia currently.   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  She is supposed to be on indefinite AC, and was put on Eliquis.  She sees Dr. Hulan Saas for chronic low back pain.  She takes Mobic for it.  She also sees Dr. Buelah Manis for weight management.  Past Medical History:  Diagnosis Date   Achilles tendinitis    Arthritis    Asthma    Clotting disorder ([place])    Diverticulitis    Diverticulosis    GERD (gastroesophageal reflux disease)    Heart murmur    as a child   History of blood clots    History of chicken pox    History of pulmonary embolus (PE) 2018   Hyperlipidemia    Hypertension    Liver cirrhosis secondary to NASH (nonalcoholic steatohepatitis) (HCC)    Osteoarthritis    Phlebitis    Plantar fasciitis    Sleep apnea    not wearing CPAP at this time   Vertigo     Past Surgical History:  Procedure Laterality Date   ABDOMINAL HYSTERECTOMY     bone spur removal     COLONOSCOPY     KNEE ARTHROSCOPY Right    KNEE ARTHROSCOPY  Right    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   Procedure: TOTAL KNEE ARTHROPLASTY;  Surgeon: Paralee Cancel, MD;  Location: [facility];  Service: Orthopedics;  Laterality: Right;  18 mins    Family History  Problem Relation Age of Onset   Arthritis Mother    Hypertension Mother    Hypertension Father    Heart attack Father    Early death Father    Asthma Sister    Heart attack Brother    Early death Brother    Hypertension Brother    Stroke Maternal Grandmother    Heart attack Maternal Grandfather    Heart attack Paternal Grandfather    Early death Paternal Grandfather    Colon cancer Neg Hx    Stomach cancer Neg Hx    Pancreatic cancer Neg Hx    Esophageal cancer Neg Hx    Rectal cancer Neg Hx     Social History   Socioeconomic History   Marital status: Divorced    Spouse name: Not on file   Number of children: 1   Years of education: Not on file   Highest education level: Not on file  Occupational History   Occupation: Disabled  Tobacco Use   Smoking status: Never   Smokeless  tobacco: Never  Vaping Use   Vaping Use: Never used  Substance and Sexual Activity   Alcohol use: Not Currently   Drug use: No   Sexual activity: Not Currently  Other Topics Concern   Not on file  Social History Narrative   Not on file   Social Determinants of Health   Financial Resource Strain: Low Risk  (06/03/2021)   Overall Financial Resource Strain (CARDIA)    Difficulty of Paying Living Expenses: Not hard at all  Food Insecurity: No Food Insecurity (06/03/2021)   Hunger Vital Sign    Worried About Running Out of Food in the Last Year: Never true    [place] in the Last Year: Never true  Transportation Needs: No Transportation Needs (06/03/2021)   PRAPARE - Hydrologist (Medical): No    Lack of Transportation (Non-Medical): No  Physical Activity: Sufficiently Active (06/03/2021)   Exercise Vital Sign    Days of Exercise per Week: 5 days    Minutes  of Exercise per Session: 60 min  Stress: Not on file  Social Connections: Moderately Integrated (06/03/2021)   Social Connection and Isolation Panel [NHANES]    Frequency of Communication with Friends and Family: More than three times a week    Frequency of Social Gatherings with Friends and Family: More than three times a week    Attends Religious Services: More than 4 times per year    Active Member of [employer] or Organizations: Yes    Attends Music therapist: More than 4 times per year    Marital Status: Divorced  Intimate Partner Violence: Not At Risk (06/03/2021)   Humiliation, Afraid, Rape, and Kick questionnaire    Fear of Current or Ex-Partner: No    Emotionally Abused: No    Physically Abused: No    Sexually Abused: No    Outpatient Medications Prior to Visit  Medication Sig Dispense Refill   albuterol (VENTOLIN HFA) 108 (90 Base) MCG/ACT inhaler Inhale 2 puffs into the lungs every 6 (six) hours as needed for wheezing or shortness of breath.      amLODipine (NORVASC) 10 MG tablet Take 1 tablet (10 mg total) by mouth daily. Take 1 tablet (10 mg total) daily for high blood pressure. 30 tablet 5   apixaban (ELIQUIS) 5 MG TABS tablet Take 1 tablet (5 mg total) by mouth 2 (two) times daily. 60 tablet 5   benzonatate (TESSALON) 100 MG capsule Take 1 capsule (100 mg total) by mouth 2 (two) times daily as needed for cough. 30 capsule 1   cholecalciferol (VITAMIN D3) 25 MCG (1000 UNIT) tablet Take 1,000 Units by mouth daily.     COD LIVER OIL PO Take 1 capsule by mouth daily.     cyclobenzaprine (FLEXERIL) 5 MG tablet Take 1 tablet (5 mg total) by mouth 2 (two) times daily as needed for muscle spasms. 30 tablet 1   fluticasone (FLONASE) 50 MCG/ACT nasal spray Place 2 sprays into both nostrils daily. 16 g 11   fluticasone-salmeterol (ADVAIR DISKUS) 250-50 MCG/ACT AEPB Inhale 1 puff into the lungs in the morning and at bedtime. 60 each 5   Iron-Vitamin C (IRON 100/C) 100-250 MG TABS  Take by mouth.     lidocaine (LIDODERM) 5 % Place 1 patch onto the skin daily. Remove & Discard patch within 12 hours or as directed by MD 30 patch 0   meloxicam (MOBIC) 15 MG tablet Take 1 tablet (15 mg  total) by mouth daily. 30 tablet 0   Zinc Sulfate (ZINC 15 PO) Take 1 capsule by mouth daily.     predniSONE (DELTASONE) 20 MG tablet Take 2 tablets (40 mg total) by mouth daily with breakfast. 10 tablet 0   No facility-administered medications prior to visit.    No Known Allergies  ROS Review of Systems  Constitutional:  Negative for chills and fever.  HENT:  Negative for congestion, sinus pressure, sinus pain and sore throat.   Eyes:  Negative for pain and discharge.  Respiratory:  Positive for cough. Negative for shortness of breath.   Cardiovascular:  Negative for chest pain and palpitations.  Gastrointestinal:  Negative for abdominal pain, diarrhea, nausea and vomiting.  Endocrine: Negative for polydipsia and polyuria.  Genitourinary:  Negative for dysuria and hematuria.  Musculoskeletal:  Positive for back pain. Negative for neck pain and neck stiffness.  Skin:  Negative for rash.  Neurological:  Negative for dizziness and weakness.  Psychiatric/Behavioral:  Negative for agitation and behavioral problems.       Objective:    Physical Exam Vitals reviewed.  Constitutional:      General: She is not in acute distress.    Appearance: She is not diaphoretic.  HENT:     Head: Normocephalic and atraumatic.     Nose: Nose normal.     Mouth/Throat:     Mouth: Mucous membranes are moist.  Eyes:     General: No scleral icterus.    Extraocular Movements: Extraocular movements intact.  Cardiovascular:     Rate and Rhythm: Normal rate and regular rhythm.     Heart sounds: Normal heart sounds. No murmur heard. Pulmonary:     Breath sounds: Normal breath sounds. No wheezing or rales.  Musculoskeletal:     Cervical back: Neck supple. No tenderness.     Right lower leg: No  edema.     Left lower leg: No edema.  Skin:    General: Skin is warm.     Findings: No rash.  Neurological:     General: No focal deficit present.     Mental Status: She is alert and oriented to person, place, and time.     Sensory: No sensory deficit.     Motor: No weakness.  Psychiatric:        Mood and Affect: Mood normal.        Behavior: Behavior normal.     BP 136/80 (BP Location: Right Arm, Cuff Size: Normal)   Pulse 87   Ht '5\' 6"'$  (1.676 m)   Wt 218 lb 9.6 oz (99.2 kg)   SpO2 98%   BMI 35.28 kg/m  Wt Readings from Last 3 Encounters:  04/10/22 218 lb 9.6 oz (99.2 kg)  03/25/22 224 lb (101.6 kg)  02/11/22 218 lb (98.9 kg)    Lab Results  Component Value Date   TSH 0.91 08/22/2021   Lab Results  Component Value Date   WBC 4.3 10/30/2021   HGB 12.5 10/30/2021   HCT 36.4 10/30/2021   MCV 90 10/30/2021   PLT 214 10/30/2021   Lab Results  Component Value Date   NA 143 10/30/2021   K 4.4 10/30/2021   CO2 23 10/30/2021   GLUCOSE 81 10/30/2021   BUN 9 10/30/2021   CREATININE 0.77 10/30/2021   BILITOT 0.5 04/29/2021   ALKPHOS 83 04/29/2021   AST 25 04/29/2021   ALT 27 04/29/2021   PROT 7.3 04/29/2021   ALBUMIN 4.3 04/29/2021  CALCIUM 9.8 10/30/2021   ANIONGAP 9 06/08/2019   EGFR 86 10/30/2021   GFR 83.37 01/22/2021   Lab Results  Component Value Date   CHOL 192 04/29/2021   Lab Results  Component Value Date   HDL 52 04/29/2021   Lab Results  Component Value Date   LDLCALC 126 (H) 04/29/2021   Lab Results  Component Value Date   TRIG 78 04/29/2021   Lab Results  Component Value Date   CHOLHDL 3.7 04/29/2021   Lab Results  Component Value Date   HGBA1C 5.4 04/29/2021      Assessment & Plan:   Problem List Items Addressed This Visit       Cardiovascular and Mediastinum   Essential hypertension - Primary    BP Readings from Last 1 Encounters:  04/10/22 136/80  Well-controlled with amlodipine, chart review suggests noncompliance  to treatment in the past Counseled for compliance with the medications Advised DASH diet and moderate exercise/walking, at least 150 mins/week      Relevant Orders   TSH     Respiratory   Asthma    Had added Advair as maintenance treatment Albuterol inhaler as needed for dyspnea or wheezing Tessalon PRN for cough Back pain likely due to inflammatory cough      Relevant Orders   TSH     Other   History of pulmonary embolus (PE)    Chart review suggests history of unprovoked PE/DVT in the past Supposed to be on indefinite AC, on Eliquis Referred to heme-onc for further discussion      Morbid obesity ([place])    BMI Readings from Last 3 Encounters:  04/10/22 35.28 kg/m  03/25/22 36.15 kg/m  02/11/22 34.66 kg/m  Associated with HTN, HLD, OSA and OA of knee Advised to follow DASH diet and perform moderate exercise as tolerated      Chronic anticoagulation   Relevant Orders   CBC with Differential/Platelet   Mixed hyperlipidemia    Advised to follow DASH diet      Relevant Orders   Lipid panel   Prediabetes    Lab Results  Component Value Date   HGBA1C 5.4 04/29/2021  Advised to follow DASH diet for HTN      Relevant Orders   Hemoglobin A1c   CMP14+EGFR   Urine Microalbumin w/creat. ratio   Other Visit Diagnoses     Vitamin D deficiency       Relevant Orders   VITAMIN D 25 Hydroxy (Vit-D Deficiency, Fractures)       No orders of the defined types were placed in this encounter.   Follow-up: Return in about 7 months (around 11/10/2022) for Annual physical.    Lindell Spar, MD

## 2022-04-10 NOTE — Assessment & Plan Note (Signed)
BMI Readings from Last 3 Encounters:  04/10/22 35.28 kg/m  03/25/22 36.15 kg/m  02/11/22 34.66 kg/m   Associated with HTN, HLD, OSA and OA of knee Advised to follow DASH diet and perform moderate exercise as tolerated

## 2022-04-10 NOTE — Assessment & Plan Note (Signed)
Advised to follow DASH diet

## 2022-04-10 NOTE — Assessment & Plan Note (Signed)
Had added Advair as maintenance treatment Albuterol inhaler as needed for dyspnea or wheezing Tessalon PRN for cough Back pain likely due to inflammatory cough

## 2022-04-10 NOTE — Assessment & Plan Note (Signed)
BP Readings from Last 1 Encounters:  04/10/22 136/80   Well-controlled with amlodipine, chart review suggests noncompliance to treatment in the past Counseled for compliance with the medications Advised DASH diet and moderate exercise/walking, at least 150 mins/week

## 2022-04-10 NOTE — Assessment & Plan Note (Signed)
Lab Results  Component Value Date   HGBA1C 5.4 04/29/2021   Advised to follow DASH diet for HTN

## 2022-04-10 NOTE — Patient Instructions (Signed)
Please continue to take medications as prescribed. ? ?Please continue to follow low carb diet and perform moderate exercise/walking at least 150 mins/week. ?

## 2022-04-11 DIAGNOSIS — E559 Vitamin D deficiency, unspecified: Secondary | ICD-10-CM | POA: Diagnosis not present

## 2022-04-11 DIAGNOSIS — R7303 Prediabetes: Secondary | ICD-10-CM | POA: Diagnosis not present

## 2022-04-11 DIAGNOSIS — E782 Mixed hyperlipidemia: Secondary | ICD-10-CM | POA: Diagnosis not present

## 2022-04-11 DIAGNOSIS — I10 Essential (primary) hypertension: Secondary | ICD-10-CM | POA: Diagnosis not present

## 2022-04-11 DIAGNOSIS — J4520 Mild intermittent asthma, uncomplicated: Secondary | ICD-10-CM | POA: Diagnosis not present

## 2022-04-14 ENCOUNTER — Telehealth: Payer: Self-pay | Admitting: Internal Medicine

## 2022-04-14 NOTE — Telephone Encounter (Signed)
Pt returning call

## 2022-04-14 NOTE — Telephone Encounter (Signed)
Spoke to patient

## 2022-04-15 LAB — CBC WITH DIFFERENTIAL/PLATELET
Basophils Absolute: 0 10*3/uL (ref 0.0–0.2)
Basos: 1 %
EOS (ABSOLUTE): 0 10*3/uL (ref 0.0–0.4)
Eos: 0 %
Hematocrit: 38.9 % (ref 34.0–46.6)
Hemoglobin: 13.6 g/dL (ref 11.1–15.9)
Immature Grans (Abs): 0 10*3/uL (ref 0.0–0.1)
Immature Granulocytes: 0 %
Lymphocytes Absolute: 2.1 10*3/uL (ref 0.7–3.1)
Lymphs: 44 %
MCH: 30.9 pg (ref 26.6–33.0)
MCHC: 35 g/dL (ref 31.5–35.7)
MCV: 88 fL (ref 79–97)
Monocytes Absolute: 0.6 10*3/uL (ref 0.1–0.9)
Monocytes: 11 %
Neutrophils Absolute: 2.1 10*3/uL (ref 1.4–7.0)
Neutrophils: 44 %
Platelets: 314 10*3/uL (ref 150–450)
RBC: 4.4 x10E6/uL (ref 3.77–5.28)
RDW: 12.4 % (ref 11.7–15.4)
WBC: 4.8 10*3/uL (ref 3.4–10.8)

## 2022-04-15 LAB — HEMOGLOBIN A1C
Est. average glucose Bld gHb Est-mCnc: 120 mg/dL
Hgb A1c MFr Bld: 5.8 % — ABNORMAL HIGH (ref 4.8–5.6)

## 2022-04-15 LAB — MICROALBUMIN / CREATININE URINE RATIO
Creatinine, Urine: 10.9 mg/dL
Microalb/Creat Ratio: <28 mg/g creat (ref 0–29)
Microalbumin, Urine: <3 ug/mL

## 2022-04-15 LAB — LIPID PANEL
Chol/HDL Ratio: 3.5 ratio (ref 0.0–4.4)
Cholesterol, Total: 198 mg/dL (ref 100–199)
HDL: 57 mg/dL (ref >39)
LDL Chol Calc (NIH): 126 mg/dL — ABNORMAL HIGH (ref 0–99)
Triglycerides: 80 mg/dL (ref 0–149)
VLDL Cholesterol Cal: 15 mg/dL (ref 5–40)

## 2022-04-15 LAB — CMP14+EGFR
ALT: 36 IU/L — ABNORMAL HIGH (ref 0–32)
AST: 36 IU/L (ref 0–40)
Albumin/Globulin Ratio: 1.5 (ref 1.2–2.2)
Albumin: 4.5 g/dL (ref 3.9–4.9)
Alkaline Phosphatase: 91 IU/L (ref 44–121)
BUN/Creatinine Ratio: 13 (ref 12–28)
BUN: 10 mg/dL (ref 8–27)
Bilirubin Total: 0.6 mg/dL (ref 0.0–1.2)
CO2: 23 mmol/L (ref 20–29)
Calcium: 9.9 mg/dL (ref 8.7–10.3)
Chloride: 101 mmol/L (ref 96–106)
Creatinine, Ser: 0.8 mg/dL (ref 0.57–1.00)
Globulin, Total: 3 g/dL (ref 1.5–4.5)
Glucose: 95 mg/dL (ref 70–99)
Potassium: 3.9 mmol/L (ref 3.5–5.2)
Sodium: 139 mmol/L (ref 134–144)
Total Protein: 7.5 g/dL (ref 6.0–8.5)
eGFR: 82 mL/min/{1.73_m2} (ref >59)

## 2022-04-15 LAB — TSH: TSH: 1.28 u[IU]/mL (ref 0.450–4.500)

## 2022-04-15 LAB — VITAMIN D 25 HYDROXY (VIT D DEFICIENCY, FRACTURES): Vit D, 25-Hydroxy: 43.2 ng/mL (ref 30.0–100.0)

## 2022-05-13 DIAGNOSIS — E559 Vitamin D deficiency, unspecified: Secondary | ICD-10-CM | POA: Diagnosis not present

## 2022-05-13 DIAGNOSIS — K760 Fatty (change of) liver, not elsewhere classified: Secondary | ICD-10-CM | POA: Diagnosis not present

## 2022-05-13 DIAGNOSIS — I10 Essential (primary) hypertension: Secondary | ICD-10-CM | POA: Diagnosis not present

## 2022-05-13 DIAGNOSIS — E782 Mixed hyperlipidemia: Secondary | ICD-10-CM | POA: Diagnosis not present

## 2022-05-13 DIAGNOSIS — R7303 Prediabetes: Secondary | ICD-10-CM | POA: Diagnosis not present

## 2022-05-14 NOTE — Progress Notes (Unsigned)
[name] [address] [place] [address] Phone: [phone] Subjective:   Isabel Garcia, am serving as a scribe for Dr. Antoine Primas.  I'm seeing this patient by the request  of:  Anabel Halon, MD  CC: Back and neck pain follow-up  LNL:[id number]  Isabel Garcia is a 67 y.o. female coming in with complaint of back and neck pain OMT 03/25/2022. Patient states she hurt her back working out and is ready for OMT  Medications patient has been prescribed: Meloxicam  Taking: none         Reviewed prior external information including notes and imaging from previsou exam, outside providers and external EMR if available.   As well as notes that were available from care everywhere and other healthcare systems.  Past medical history, social, surgical and family history all reviewed in electronic medical record.  No pertanent information unless stated regarding to the chief complaint.   Past Medical History:  Diagnosis Date   Achilles tendinitis    Arthritis    Asthma    Clotting disorder (HCC)    Diverticulitis    Diverticulosis    GERD (gastroesophageal reflux disease)    Heart murmur    as a child   History of blood clots    History of chicken pox    History of pulmonary embolus (PE) 2018   Hyperlipidemia    Hypertension    Liver cirrhosis secondary to NASH (nonalcoholic steatohepatitis) (HCC)    Osteoarthritis    Phlebitis    Plantar fasciitis    Sleep apnea    not wearing CPAP at this time   Vertigo     No Known Allergies   Review of Systems:  No headache, visual changes, nausea, vomiting, diarrhea, constipation, dizziness, abdominal pain, skin rash, fevers, chills, night sweats, weight loss, swollen lymph nodes, body aches, joint swelling, chest pain, shortness of breath, mood changes. POSITIVE muscle aches  Objective  Blood pressure 120/82, pulse 81, height 5\' 6"  (1.676 m), weight 218 lb (98.9 kg), SpO2 98 %.    General: No apparent distress alert and oriented x3 mood and affect normal, dressed appropriately.  HEENT: Pupils equal, extraocular movements intact  Respiratory: Patient's speak in full sentences and does not appear short of breath  Cardiovascular: No lower extremity edema, non tender, no erythema  Low back exam does have some loss lordosis noted.  Osteopathic findings  C2 flexed rotated and side bent right C5 flexed rotated and side bent left T3 extended rotated and side bent right inhaled rib T8 extended rotated and side bent left L2 flexed rotated and side bent right L3 flexed rotated and side bent left L5 flexed rotated and side bent left Sacrum right on right       Assessment and Plan:  Chronic low back pain with left-sided sciatica Chronic low back pain with history of the degenerative disc disease.  Recently traveled and has gotten out of the regular routine.  Discussed with patient about how to start increasing activity.  Working on weight loss.  He was concerned because there was significant loss and muscle mass she states working with her healthy weight and wellness.  We discussed with patient about protein supplementations.  Icing regimen and home exercises.  Hopefully getting patient's BMI under 30 in the next several months.  Follow-up again in 6 to 8 weeks otherwise. Did discuss medications including the meloxicam and the cyclobenzaprine.  Patient has been on Eliquis so  warned about the meloxicam and patient understands not to use it regularly.  Nonallopathic problems  Decision today to treat with OMT was based on Physical Exam  After verbal consent patient was treated with HVLA, ME, FPR techniques in cervical, rib, thoracic, lumbar, and sacral  areas  Patient tolerated the procedure well with improvement in symptoms  Patient given exercises, stretches and lifestyle modifications  See medications in patient instructions if given  Patient will follow up in 4-8  weeks      The above documentation has been reviewed and is accurate and complete Judi Saa, DO        Note: This dictation was prepared with Dragon dictation along with smaller phrase technology. Any transcriptional errors that result from this process are unintentional.

## 2022-05-15 ENCOUNTER — Ambulatory Visit: Payer: Medicare Other | Admitting: Family Medicine

## 2022-05-15 ENCOUNTER — Encounter: Payer: Self-pay | Admitting: Family Medicine

## 2022-05-15 VITALS — BP 120/82 | HR 81 | Ht 66.0 in | Wt 218.0 lb

## 2022-05-15 DIAGNOSIS — G8929 Other chronic pain: Secondary | ICD-10-CM | POA: Diagnosis not present

## 2022-05-15 DIAGNOSIS — M5442 Lumbago with sciatica, left side: Secondary | ICD-10-CM

## 2022-05-15 DIAGNOSIS — M9903 Segmental and somatic dysfunction of lumbar region: Secondary | ICD-10-CM

## 2022-05-15 DIAGNOSIS — M9902 Segmental and somatic dysfunction of thoracic region: Secondary | ICD-10-CM | POA: Diagnosis not present

## 2022-05-15 DIAGNOSIS — M9908 Segmental and somatic dysfunction of rib cage: Secondary | ICD-10-CM | POA: Diagnosis not present

## 2022-05-15 DIAGNOSIS — M9904 Segmental and somatic dysfunction of sacral region: Secondary | ICD-10-CM | POA: Diagnosis not present

## 2022-05-15 DIAGNOSIS — M9901 Segmental and somatic dysfunction of cervical region: Secondary | ICD-10-CM

## 2022-05-15 NOTE — Patient Instructions (Signed)
Good to see you  Glad you had a great trip Get back in the gym Biking would be best cardio See me in 2 months

## 2022-05-15 NOTE — Assessment & Plan Note (Signed)
Chronic low back pain with history of the degenerative disc disease.  Recently traveled and has gotten out of the regular routine.  Discussed with patient about how to start increasing activity.  Working on weight loss.  He was concerned because there was significant loss and muscle mass she states working with her healthy weight and wellness.  We discussed with patient about protein supplementations.  Icing regimen and home exercises.  Hopefully getting patient's BMI under 30 in the next several months.  Follow-up again in 6 to 8 weeks otherwise.

## 2022-06-03 NOTE — Progress Notes (Unsigned)
[facility] [address], [place] [address]   CLINIC: Medical Oncology/Hematology  PCP:  Anabel Halon, MD [address] [place] [address] [phone]   REASON FOR VISIT:  Follow-up for unprovoked DVT and PE.   CURRENT THERAPY: Eliquis  INTERVAL HISTORY:   Isabel Garcia 67 y.o. 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  She was last seen by Rojelio Brenner PA-C on 06/10/2021.  She reports that she is compliant with Eliquis.  She denies any major source of bleeding such as epistaxis, hematemesis, hematochezia, or melena.   She has not had any other DVT or PE since her initial episode.  She denies any unilateral leg swelling, pain, erythema, shortness of breath, dyspnea on exertion, chest pain, cough, hemoptysis, or palpitations; no current signs or symptoms of DVT or PE.   She has 60% energy and 100% appetite. She endorses that she is maintaining a stable weight.   ASSESSMENT & PLAN:  1.   Unprovoked DVT and PE: - She had right leg DVT and PE in January 2019 in [place], when she had syncopal episode on the street, resulting in admission to the hospital.  Denied any obvious provoking factor. - She reported multiple vein ablations done, last ablation about 1 year prior to DVT.  She also reports that she was wearing a tight cast (for Achilles tendinitis) a few months prior to DVT.  At that time, she was spending long hours on her feet. - 2D echo in June 2019 shows normal EF 65-70%. - Mammogram on 04/15/2019 is BI-RADS Category 1. - Does not have any B symptoms.   No personal history of malignancy.  No family history of malignancy. - Coagulopathy work-up (04/19/2019):  Lupus anticoagulant, anticardiolipin antibody, antibeta-2 glycoprotein 1 antibody, factor V Leiden and prothrombin gene mutations were negative. -  Bilateral lower extremity Doppler (04/27/2019) were also negative for DVT. - Labs today (06/04/2022): Normal CBC and  D-dimer - No current symptoms of DVT or PE. - She is taking Eliquis 5 mg twice daily.  She denies any major bleeding events. - PLAN: Due to history of unprovoked symptomatic pulmonary embolism and low risk of bleeding at this time, we suggest indefinite anticoagulation. - We will continue to monitor risk-benefit ratio once a year in addition to checking annual D-dimer. - Labs and RTC in 1 year   PLAN SUMMARY: >> Labs in 1 year = BMP, CBC/D, Dimer >> OFFICE visit in 1 year      REVIEW OF SYSTEMS:   Review of Systems  Constitutional:  Positive for fatigue (baseline, mild fatigue). Negative for appetite change, chills, diaphoresis, fever and unexpected weight change.  HENT:   Negative for lump/mass and nosebleeds.   Eyes:  Negative for eye problems.  Respiratory:  Negative for cough, hemoptysis and shortness of breath.   Cardiovascular:  Negative for chest pain, leg swelling and palpitations.  Gastrointestinal:  Negative for abdominal pain, blood in stool, constipation, diarrhea, nausea and vomiting.  Genitourinary:  Negative for hematuria.   Musculoskeletal:  Positive for back pain.  Skin: Negative.   Neurological:  Negative for dizziness, headaches and light-headedness.  Hematological:  Does not bruise/bleed easily.  Psychiatric/Behavioral:  Positive for sleep disturbance.      PHYSICAL EXAM:  ECOG PERFORMANCE STATUS: 0 - Asymptomatic  There were no vitals filed for this visit. There were no vitals filed for this visit. Physical Exam Constitutional:      Appearance: Normal  appearance. She is obese.  Cardiovascular:     Heart sounds: Normal heart sounds.  Pulmonary:     Breath sounds: Normal breath sounds.  Neurological:     General: No focal deficit present.     Mental Status: Mental status is at baseline.  Psychiatric:        Behavior: Behavior normal. Behavior is cooperative.     PAST MEDICAL/SURGICAL HISTORY:  Past Medical History:  Diagnosis Date   Achilles  tendinitis    Arthritis    Asthma    Clotting disorder (HCC)    Diverticulitis    Diverticulosis    GERD (gastroesophageal reflux disease)    Heart murmur    as a child   History of blood clots    History of chicken pox    History of pulmonary embolus (PE) 2018   Hyperlipidemia    Hypertension    Liver cirrhosis secondary to NASH (nonalcoholic steatohepatitis) (HCC)    Osteoarthritis    Phlebitis    Plantar fasciitis    Sleep apnea    not wearing CPAP at this time   Vertigo    Past Surgical History:  Procedure Laterality Date   ABDOMINAL HYSTERECTOMY     bone spur removal     COLONOSCOPY     KNEE ARTHROSCOPY Right    KNEE ARTHROSCOPY Right    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   Procedure: TOTAL KNEE ARTHROPLASTY;  Surgeon: Durene Romans, MD;  Location: [facility];  Service: Orthopedics;  Laterality: Right;  70 mins    SOCIAL HISTORY:  Social History   Socioeconomic History   Marital status: Divorced    Spouse name: Not on file   Number of children: 1   Years of education: Not on file   Highest education level: Not on file  Occupational History   Occupation: Disabled  Tobacco Use   Smoking status: Never   Smokeless tobacco: Never  Vaping Use   Vaping Use: Never used  Substance and Sexual Activity   Alcohol use: Not Currently   Drug use: No   Sexual activity: Not Currently  Other Topics Concern   Not on file  Social History Narrative   Not on file   Social Determinants of Health   Financial Resource Strain: Low Risk  (06/03/2021)   Overall Financial Resource Strain (CARDIA)    Difficulty of Paying Living Expenses: Not hard at all  Food Insecurity: No Food Insecurity (06/03/2021)   Hunger Vital Sign    Worried About Running Out of Food in the Last Year: Never true    Ran Out of Food in the Last Year: Never true  Transportation Needs: No Transportation Needs (06/03/2021)   PRAPARE - Administrator, Civil Service (Medical): No    Lack of  Transportation (Non-Medical): No  Physical Activity: Sufficiently Active (06/03/2021)   Exercise Vital Sign    Days of Exercise per Week: 5 days    Minutes of Exercise per Session: 60 min  Stress: Not on file  Social Connections: Moderately Integrated (06/03/2021)   Social Connection and Isolation Panel [NHANES]    Frequency of Communication with Friends and Family: More than three times a week    Frequency of Social Gatherings with Friends and Family: More than three times a week    Attends Religious Services: More than 4 times per year    Active Member of [employer] or Organizations: Yes    Attends Banker Meetings: More than 4 times per  year    Marital Status: Divorced  Catering manager Violence: Not At Risk (06/03/2021)   Humiliation, Afraid, Rape, and Kick questionnaire    Fear of Current or Ex-Partner: No    Emotionally Abused: No    Physically Abused: No    Sexually Abused: No    FAMILY HISTORY:  Family History  Problem Relation Age of Onset   Arthritis Mother    Hypertension Mother    Hypertension Father    Heart attack Father    Early death Father    Asthma Sister    Heart attack Brother    Early death Brother    Hypertension Brother    Stroke Maternal Grandmother    Heart attack Maternal Grandfather    Heart attack Paternal Grandfather    Early death Paternal Grandfather    Colon cancer Neg Hx    Stomach cancer Neg Hx    Pancreatic cancer Neg Hx    Esophageal cancer Neg Hx    Rectal cancer Neg Hx     CURRENT MEDICATIONS:  Outpatient Encounter Medications as of 06/04/2022  Medication Sig   albuterol (VENTOLIN HFA) 108 (90 Base) MCG/ACT inhaler Inhale 2 puffs into the lungs every 6 (six) hours as needed for wheezing or shortness of breath.    amLODipine (NORVASC) 10 MG tablet Take 1 tablet (10 mg total) by mouth daily. Take 1 tablet (10 mg total) daily for high blood pressure.   apixaban (ELIQUIS) 5 MG TABS tablet Take 1 tablet (5 mg total) by mouth 2 (two)  times daily.   benzonatate (TESSALON) 100 MG capsule Take 1 capsule (100 mg total) by mouth 2 (two) times daily as needed for cough.   cholecalciferol (VITAMIN D3) 25 MCG (1000 UNIT) tablet Take 1,000 Units by mouth daily.   COD LIVER OIL PO Take 1 capsule by mouth daily.   cyclobenzaprine (FLEXERIL) 5 MG tablet Take 1 tablet (5 mg total) by mouth 2 (two) times daily as needed for muscle spasms.   fluticasone (FLONASE) 50 MCG/ACT nasal spray Place 2 sprays into both nostrils daily.   fluticasone-salmeterol (ADVAIR DISKUS) 250-50 MCG/ACT AEPB Inhale 1 puff into the lungs in the morning and at bedtime.   Iron-Vitamin C (IRON 100/C) 100-250 MG TABS Take by mouth.   lidocaine (LIDODERM) 5 % Place 1 patch onto the skin daily. Remove & Discard patch within 12 hours or as directed by MD   meloxicam (MOBIC) 15 MG tablet Take 1 tablet (15 mg total) by mouth daily.   Zinc Sulfate (ZINC 15 PO) Take 1 capsule by mouth daily.   No facility-administered encounter medications on file as of 06/04/2022.    ALLERGIES:  No Known Allergies  LABORATORY DATA:  I have reviewed the labs as listed.  CBC    Component Value Date/Time   WBC 4.8 04/11/2022 0912   WBC 3.9 (L) 01/22/2021 1217   RBC 4.40 04/11/2022 0912   RBC 4.34 01/22/2021 1217   HGB 13.6 04/11/2022 0912   HCT 38.9 04/11/2022 0912   PLT 314 04/11/2022 0912   MCV 88 04/11/2022 0912   MCH 30.9 04/11/2022 0912   MCH 30.8 06/08/2019 0246   MCHC 35.0 04/11/2022 0912   MCHC 34.4 01/22/2021 1217   RDW 12.4 04/11/2022 0912   LYMPHSABS 2.1 04/11/2022 0912   MONOABS 0.3 01/22/2021 1217   EOSABS 0.0 04/11/2022 0912   BASOSABS 0.0 04/11/2022 0912      Latest Ref Rng & Units 04/11/2022    9:12 AM  10/30/2021    4:06 PM 04/29/2021    3:39 PM  CMP  Glucose 70 - 99 mg/dL 95  81  78   BUN 8 - 27 mg/dL 10  9  8    Creatinine 0.57 - 1.00 mg/dL 6.96  2.95  2.84   Sodium 134 - 144 mmol/L 139  143  142   Potassium 3.5 - 5.2 mmol/L 3.9  4.4  4.2   Chloride  96 - 106 mmol/L 101  105  104   CO2 20 - 29 mmol/L 23  23  26    Calcium 8.7 - 10.3 mg/dL 9.9  9.8  9.6   Total Protein 6.0 - 8.5 g/dL 7.5   7.3   Total Bilirubin 0.0 - 1.2 mg/dL 0.6   0.5   Alkaline Phos 44 - 121 IU/L 91   83   AST 0 - 40 IU/L 36   25   ALT 0 - 32 IU/L 36   27     DIAGNOSTIC IMAGING:  I have independently reviewed the relevant imaging and discussed with the patient.   WRAP UP:  All questions were answered. The patient knows to call the clinic with any problems, questions or concerns.  Medical decision making: Low  Time spent on visit: I spent 15 minutes counseling the patient face to face. The total time spent in the appointment was 22 minutes and more than 50% was on counseling.  Carnella Guadalajara, PA-C  06/04/22 2:31 PM

## 2022-06-04 ENCOUNTER — Other Ambulatory Visit: Payer: Self-pay

## 2022-06-04 ENCOUNTER — Inpatient Hospital Stay: Payer: Medicare Other | Attending: Physician Assistant

## 2022-06-04 ENCOUNTER — Other Ambulatory Visit: Payer: Medicare Other

## 2022-06-04 ENCOUNTER — Ambulatory Visit: Payer: Medicare Other | Admitting: Physician Assistant

## 2022-06-04 ENCOUNTER — Inpatient Hospital Stay (HOSPITAL_BASED_OUTPATIENT_CLINIC_OR_DEPARTMENT_OTHER): Payer: Medicare Other | Admitting: Physician Assistant

## 2022-06-04 VITALS — BP 111/79 | HR 82 | Temp 97.9°F | Resp 16 | Wt 215.2 lb

## 2022-06-04 DIAGNOSIS — Z86711 Personal history of pulmonary embolism: Secondary | ICD-10-CM | POA: Diagnosis not present

## 2022-06-04 DIAGNOSIS — Z7901 Long term (current) use of anticoagulants: Secondary | ICD-10-CM | POA: Diagnosis not present

## 2022-06-04 DIAGNOSIS — Z86718 Personal history of other venous thrombosis and embolism: Secondary | ICD-10-CM | POA: Diagnosis not present

## 2022-06-04 LAB — CBC
HCT: 38 % (ref 36.0–46.0)
Hemoglobin: 13 g/dL (ref 12.0–15.0)
MCH: 31.1 pg (ref 26.0–34.0)
MCHC: 34.2 g/dL (ref 30.0–36.0)
MCV: 90.9 fL (ref 80.0–100.0)
Platelets: 273 10*3/uL (ref 150–400)
RBC: 4.18 MIL/uL (ref 3.87–5.11)
RDW: 12.8 % (ref 11.5–15.5)
WBC: 4.3 10*3/uL (ref 4.0–10.5)
nRBC: 0 % (ref 0.0–0.2)

## 2022-06-04 LAB — D-DIMER, QUANTITATIVE: D-Dimer, Quant: 0.27 ug/mL-FEU (ref 0.00–0.50)

## 2022-06-04 NOTE — Patient Instructions (Signed)
[facility] ?Discharge Instructions ? ?You were seen today by Katrianna Friesenhahn PA-C for your HISTORY OF BLOOD CLOTS.  Since your first blood clot did not have any obvious cause, you are at high risk of recurrent blood clots.  Therefore, I recommend that you continue Eliquis at this time.  We will see you once a year for ongoing evaluation of the risks versus benefits of blood thinners.  ? ? ?Thank you for choosing [facility] to provide your oncology and hematology care.  To afford each patient quality time with our provider, please arrive at least 15 minutes before your scheduled appointment time.  ? ?If you have a lab appointment with the Cancer Center please come in thru the Main Entrance and check in at the main information desk. ? ?You need to re-schedule your appointment should you arrive 10 or more minutes late.  We strive to give you quality time with our providers, and arriving late affects you and other patients whose appointments are after yours.  Also, if you no show three or more times for appointments you may be dismissed from the clinic at the providers discretion.     ?Again, thank you for choosing [facility].  Our hope is that these requests will decrease the amount of time that you wait before being seen by our physicians.       ?_____________________________________________________________ ? ?Should you have questions after your visit to [facility], please contact our office at [phone] and follow the prompts.  Our office hours are 8:00 a.m. and 4:30 p.m. Monday - Friday.  Please note that voicemails left after 4:00 p.m. may not be returned until the following business day.  We are closed weekends and major holidays.  You do have access to a nurse 24-7, just call the main number to the clinic [phone] and do not press any options, hold on the line and a nurse will answer the phone.    ? ?For prescription refill requests, have your pharmacy contact our office and allow 72 hours.   ? ?Due to Covid, you will need to wear a mask upon entering the hospital. If you do not have a mask, a mask will be given to you at the Main Entrance upon arrival. For doctor visits, patients may have 1 support person age 18 or older with them. For treatment visits, patients can not have anyone with them due to social distancing guidelines and our immunocompromised population.  ? ? ? ?

## 2022-06-09 ENCOUNTER — Ambulatory Visit (INDEPENDENT_AMBULATORY_CARE_PROVIDER_SITE_OTHER): Payer: Medicare Other | Admitting: Internal Medicine

## 2022-06-09 ENCOUNTER — Encounter: Payer: Self-pay | Admitting: Internal Medicine

## 2022-06-09 VITALS — BP 123/82 | HR 82 | Ht 66.0 in | Wt 215.0 lb

## 2022-06-09 DIAGNOSIS — H539 Unspecified visual disturbance: Secondary | ICD-10-CM | POA: Diagnosis not present

## 2022-06-09 DIAGNOSIS — Z0000 Encounter for general adult medical examination without abnormal findings: Secondary | ICD-10-CM | POA: Diagnosis not present

## 2022-06-09 NOTE — Patient Instructions (Signed)
  [name] , Thank you for taking time to come for your Medicare Wellness Visit. I appreciate your ongoing commitment to your health goals. Please review the following plan we discussed and let me know if I can assist you in the future.   These are the goals we discussed:  Goals      DIET - EAT MORE FRUITS AND VEGETABLES     Patient Stated     Stay as active as you are!     Patient Stated     Continue to try to lose weight . Working with RD.         This is a list of the screening recommended for you and due dates:  Health Maintenance  Topic Date Due   Eye exam for diabetics  Never done   COVID-19 Vaccine (5 - 2023-24 season) 10/04/2021   Flu Shot  09/04/2022   Hemoglobin A1C  10/12/2022   Complete foot exam   10/31/2022   Yearly kidney function blood test for diabetes  04/11/2023   Yearly kidney health urinalysis for diabetes  04/11/2023   Mammogram  06/04/2023   Medicare Annual Wellness Visit  06/09/2023   DTaP/Tdap/Td vaccine (2 - Td or Tdap) 09/05/2024   Colon Cancer Screening  09/27/2029   Hepatitis C Screening: USPSTF Recommendation to screen - Ages 46-79 yo.  Completed   HIV Screening  Completed   Zoster (Shingles) Vaccine  Completed   HPV Vaccine  Aged Out   Pap Smear  Discontinued

## 2022-06-09 NOTE — Progress Notes (Signed)
Subjective:   FUMIYE BASCO is a 67 y.o. female who presents for an Initial Medicare Annual Wellness Visit.  Review of Systems    Review of Systems  All other systems reviewed and are negative.    Objective:    Today's Vitals   06/09/22 1409 06/09/22 1410  BP: 123/82   Pulse: 82   SpO2: 96%   Weight: 215 lb (97.5 kg)   Height: 5\' 6"  (1.676 m)   PainSc: 0-No pain 0-No pain   Body mass index is 34.7 kg/m.     06/04/2022    1:53 PM 08/26/2021    2:49 PM 06/10/2021   12:58 PM 06/03/2021    1:35 PM 10/18/2019    9:15 AM 09/13/2019   10:47 AM 06/13/2019   10:26 AM  Advanced Directives  Does Patient Have a Medical Advance Directive? No No No No No No No  Would patient like information on creating a medical advance directive? No - Patient declined Yes (MAU/Ambulatory/Procedural Areas - Information given) No - Patient declined No - Patient declined Yes (MAU/Ambulatory/Procedural Areas - Information given)  No - Patient declined    Current Medications (verified) Outpatient Encounter Medications as of 06/09/2022  Medication Sig   albuterol (VENTOLIN HFA) 108 (90 Base) MCG/ACT inhaler Inhale 2 puffs into the lungs every 6 (six) hours as needed for wheezing or shortness of breath.    amLODipine (NORVASC) 10 MG tablet Take 1 tablet (10 mg total) by mouth daily. Take 1 tablet (10 mg total) daily for high blood pressure.   apixaban (ELIQUIS) 5 MG TABS tablet Take 1 tablet (5 mg total) by mouth 2 (two) times daily.   benzonatate (TESSALON) 100 MG capsule Take 1 capsule (100 mg total) by mouth 2 (two) times daily as needed for cough.   cholecalciferol (VITAMIN D3) 25 MCG (1000 UNIT) tablet Take 1,000 Units by mouth daily.   COD LIVER OIL PO Take 1 capsule by mouth daily.   cyclobenzaprine (FLEXERIL) 5 MG tablet Take 1 tablet (5 mg total) by mouth 2 (two) times daily as needed for muscle spasms.   fluticasone (FLONASE) 50 MCG/ACT nasal spray Place 2 sprays into both nostrils daily.    fluticasone-salmeterol (ADVAIR DISKUS) 250-50 MCG/ACT AEPB Inhale 1 puff into the lungs in the morning and at bedtime.   Iron-Vitamin C (IRON 100/C) 100-250 MG TABS Take by mouth.   lidocaine (LIDODERM) 5 % Place 1 patch onto the skin daily. Remove & Discard patch within 12 hours or as directed by MD   meloxicam (MOBIC) 15 MG tablet Take 1 tablet (15 mg total) by mouth daily.   Zinc Sulfate (ZINC 15 PO) Take 1 capsule by mouth daily.   No facility-administered encounter medications on file as of 06/09/2022.    Allergies (verified) Patient has no known allergies.   History: Past Medical History:  Diagnosis Date   Achilles tendinitis    Arthritis    Asthma    Clotting disorder (HCC)    Diverticulitis    Diverticulosis    GERD (gastroesophageal reflux disease)    Heart murmur    as a child   History of blood clots    History of chicken pox    History of pulmonary embolus (PE) 2018   Hyperlipidemia    Hypertension    Liver cirrhosis secondary to NASH (nonalcoholic steatohepatitis) (HCC)    Osteoarthritis    Phlebitis    Plantar fasciitis    Sleep apnea    not wearing  CPAP at this time   Vertigo    Past Surgical History:  Procedure Laterality Date   ABDOMINAL HYSTERECTOMY     bone spur removal     COLONOSCOPY     KNEE ARTHROSCOPY Right    KNEE ARTHROSCOPY Right    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   Procedure: TOTAL KNEE ARTHROPLASTY;  Surgeon: Durene Romans, MD;  Location: [facility];  Service: Orthopedics;  Laterality: Right;  70 mins   Family History  Problem Relation Age of Onset   Arthritis Mother    Hypertension Mother    Hypertension Father    Heart attack Father    Early death Father    Asthma Sister    Heart attack Brother    Early death Brother    Hypertension Brother    Stroke Maternal Grandmother    Heart attack Maternal Grandfather    Heart attack Paternal Grandfather    Early death Paternal Grandfather    Colon cancer Neg Hx    Stomach cancer Neg  Hx    Pancreatic cancer Neg Hx    Esophageal cancer Neg Hx    Rectal cancer Neg Hx    Social History   Socioeconomic History   Marital status: Divorced    Spouse name: Not on file   Number of children: 1   Years of education: Not on file   Highest education level: Not on file  Occupational History   Occupation: Disabled  Tobacco Use   Smoking status: Never   Smokeless tobacco: Never  Vaping Use   Vaping Use: Never used  Substance and Sexual Activity   Alcohol use: Not Currently   Drug use: No   Sexual activity: Not Currently  Other Topics Concern   Not on file  Social History Narrative   Not on file   Social Determinants of Health   Financial Resource Strain: Low Risk  (06/09/2022)   Overall Financial Resource Strain (CARDIA)    Difficulty of Paying Living Expenses: Not hard at all  Food Insecurity: No Food Insecurity (06/09/2022)   Hunger Vital Sign    Worried About Running Out of Food in the Last Year: Never true    Ran Out of Food in the Last Year: Never true  Transportation Needs: No Transportation Needs (06/09/2022)   PRAPARE - Administrator, Civil Service (Medical): No    Lack of Transportation (Non-Medical): No  Physical Activity: Sufficiently Active (06/09/2022)   Exercise Vital Sign    Days of Exercise per Week: 5 days    Minutes of Exercise per Session: 60 min  Stress: No Stress Concern Present (06/09/2022)   [employer] of Occupational Health - Occupational Stress Questionnaire    Feeling of Stress : Not at all  Social Connections: Moderately Integrated (06/09/2022)   Social Connection and Isolation Panel [NHANES]    Frequency of Communication with Friends and Family: More than three times a week    Frequency of Social Gatherings with Friends and Family: More than three times a week    Attends Religious Services: More than 4 times per year    Active Member of [employer] or Organizations: Yes    Attends Engineer, structural: More than 4 times  per year    Marital Status: Divorced    Tobacco Counseling Counseling given: Yes   Clinical Intake:  Pre-visit preparation completed: Yes  Pain : No/denies pain Pain Score: 0-No pain     Nutritional Risks: None Diabetes: No  How  often do you need to have someone help you when you read instructions, pamphlets, or other written materials from your doctor or pharmacy?: 1 - Never   Interpreter Needed?: No    Activities of Daily Living    06/09/2022    2:15 PM 06/09/2022    2:09 PM  In your present state of health, do you have any difficulty performing the following activities:  Hearing? 0 0  Vision? 0 0  Difficulty concentrating or making decisions? 0 0  Walking or climbing stairs? 0 0  Dressing or bathing? 0 0  Doing errands, shopping? 0 0  Preparing Food and eating ? N N  Using the Toilet? N N  In the past six months, have you accidently leaked urine? N N  Do you have problems with loss of bowel control? N N  Managing your Medications? N N  Managing your Finances? N N  Housekeeping or managing your Housekeeping? N N    Patient Care Team: Anabel Halon, MD as PCP - General (Internal Medicine)  Indicate any recent Medical Services you may have received from other than [facility] providers in the past year (date may be approximate).     Assessment:   This is a routine wellness examination for [place].  Hearing/Vision screen No results found.  Dietary issues and exercise activities discussed:     Goals Addressed   None    Depression Screen    06/09/2022    2:16 PM 06/09/2022    2:13 PM 04/10/2022    2:59 PM 10/30/2021    3:15 PM 10/09/2021    9:47 AM 06/05/2021   10:50 AM 06/03/2021    1:40 PM  PHQ 2/9 Scores  PHQ - 2 Score 0 0 0 0 0 0 0    Fall Risk    06/09/2022    2:16 PM 04/10/2022    2:59 PM 10/30/2021    3:15 PM 10/09/2021    9:46 AM 06/05/2021   10:50 AM  Fall Risk   Falls in the past year? 0 0 0 0 0  Number falls in past yr: 0 0 0 0 0  Injury with Fall? 0 0  0 0 0  Risk for fall due to : No Fall Risks  No Fall Risks No Fall Risks No Fall Risks  Follow up Falls evaluation completed  Falls evaluation completed Falls evaluation completed Falls evaluation completed    FALL RISK PREVENTION PERTAINING TO THE HOME:  Any stairs in or around the home? Yes  If so, are there any without handrails? Yes  Home free of loose throw rugs in walkways, pet beds, electrical cords, etc? Yes  Adequate lighting in your home to reduce risk of falls? Yes   ASSISTIVE DEVICES UTILIZED TO PREVENT FALLS:  Life alert? No  Use of a cane, walker or w/c? No  Grab bars in the bathroom? No  Shower chair or bench in shower? Yes  Elevated toilet seat or a handicapped toilet? Yes    Cognitive Function:        06/09/2022    2:16 PM 06/03/2021    1:43 PM  6CIT Screen  What Year? 0 points 0 points  What month? 0 points 0 points  What time? 0 points 0 points  Count back from 20 0 points 0 points  Months in reverse 0 points 0 points  Repeat phrase 0 points 0 points  Total Score 0 points 0 points    Immunizations Immunization History  Administered  Date(s) Administered   Influenza Inj Mdck Quad Pf 11/08/2018   Influenza Split 11/22/2016, 12/16/2016   Influenza, Quadrivalent, Recombinant, Inj, Pf 01/12/2021   Influenza,inj,Quad PF,6+ Mos 10/20/2017, 11/21/2019, 10/30/2021   Moderna Sars-Covid-2 Vaccination 04/23/2019, 05/25/2019, 07/28/2020, 01/12/2021   Pneumococcal Polysaccharide-23 10/20/2017   Tdap 09/06/2014   Zoster Recombinat (Shingrix) 04/12/2019, 11/15/2021    TDAP status: Up to date  Flu Vaccine status: Up to date  Pneumococcal vaccine status: Up to date  Covid-19 vaccine status: Information provided on how to obtain vaccines.   Qualifies for Shingles Vaccine? Yes   Zostavax completed No   Shingrix Completed?: Yes  Screening Tests Health Maintenance  Topic Date Due   OPHTHALMOLOGY EXAM  Never done   COVID-19 Vaccine (5 - 2023-24 season)  10/04/2021   INFLUENZA VACCINE  09/04/2022   HEMOGLOBIN A1C  10/12/2022   FOOT EXAM  10/31/2022   Diabetic kidney evaluation - eGFR measurement  04/11/2023   Diabetic kidney evaluation - Urine ACR  04/11/2023   MAMMOGRAM  06/04/2023   Medicare Annual Wellness (AWV)  06/09/2023   DTaP/Tdap/Td (2 - Td or Tdap) 09/05/2024   COLONOSCOPY (Pts 45-27yrs Insurance coverage will need to be confirmed)  09/27/2029   Hepatitis C Screening  Completed   HIV Screening  Completed   Zoster Vaccines- Shingrix  Completed   HPV VACCINES  Aged Out   PAP SMEAR-Modifier  Discontinued    Health Maintenance  Health Maintenance Due  Topic Date Due   OPHTHALMOLOGY EXAM  Never done   COVID-19 Vaccine (5 - 2023-24 season) 10/04/2021    Colorectal cancer screening: Type of screening: Colonoscopy. Completed 09/08/2019. Repeat every 10 years  Mammogram status: Completed 06/03/2021. Repeat every year  Lung Cancer Screening: (Low Dose CT Chest recommended if Age 5-80 years, 30 pack-year currently smoking OR have quit w/in 15years.) does not qualify.    Additional Screening:  Hepatitis C Screening: does not qualify; Completed 04/29/2021  Vision Screening: Recommended annual ophthalmology exams for early detection of glaucoma and other disorders of the eye. Is the patient up to date with their annual eye exam?  No  Who is the provider or what is the name of the office in which the patient attends annual eye exams? N/a If pt is not established with a provider, would they like to be referred to a provider to establish care? Yes .   Dental Screening: Recommended annual dental exams for proper oral hygiene  Community Resource Referral / Chronic Care Management: CRR required this visit?  No   CCM required this visit?  No      Plan:     I have personally reviewed and noted the following in the patient's chart:   Medical and social history Use of alcohol, tobacco or illicit drugs  Current medications and  supplements including opioid prescriptions. Patient is not currently taking opioid prescriptions. Functional ability and status Nutritional status Physical activity Advanced directives List of other physicians Hospitalizations, surgeries, and ER visits in previous 12 months Vitals Screenings to include cognitive, depression, and falls Referrals and appointments  In addition, I have reviewed and discussed with patient certain preventive protocols, quality metrics, and best practice recommendations. A written personalized care plan for preventive services as well as general preventive health recommendations were provided to patient.     Milus Banister, MD   06/09/2022

## 2022-06-17 DIAGNOSIS — I10 Essential (primary) hypertension: Secondary | ICD-10-CM | POA: Diagnosis not present

## 2022-06-17 DIAGNOSIS — R7303 Prediabetes: Secondary | ICD-10-CM | POA: Diagnosis not present

## 2022-06-17 DIAGNOSIS — E782 Mixed hyperlipidemia: Secondary | ICD-10-CM | POA: Diagnosis not present

## 2022-06-17 DIAGNOSIS — K760 Fatty (change of) liver, not elsewhere classified: Secondary | ICD-10-CM | POA: Diagnosis not present

## 2022-06-17 DIAGNOSIS — E559 Vitamin D deficiency, unspecified: Secondary | ICD-10-CM | POA: Diagnosis not present

## 2022-07-15 DIAGNOSIS — K760 Fatty (change of) liver, not elsewhere classified: Secondary | ICD-10-CM | POA: Diagnosis not present

## 2022-07-15 DIAGNOSIS — E559 Vitamin D deficiency, unspecified: Secondary | ICD-10-CM | POA: Diagnosis not present

## 2022-07-15 DIAGNOSIS — R7303 Prediabetes: Secondary | ICD-10-CM | POA: Diagnosis not present

## 2022-07-15 DIAGNOSIS — I10 Essential (primary) hypertension: Secondary | ICD-10-CM | POA: Diagnosis not present

## 2022-07-15 DIAGNOSIS — E782 Mixed hyperlipidemia: Secondary | ICD-10-CM | POA: Diagnosis not present

## 2022-07-16 NOTE — Progress Notes (Signed)
[name] [address] [place] [address] Phone: [phone] Subjective:   Isabel Garcia, am serving as a scribe for Dr. Antoine Primas.  I'm seeing this patient by the request  of:  Anabel Halon, MD  CC: back and neck pain f/u   [id number]  Isabel Garcia is a 67 y.o. female coming in with complaint of back and neck pain. OMT on 05/15/2022. Patient states that she is the same as last visit. Patient has been doing some squats with weights and her knees have been aching. Also has pain at rest. Pain beneath patella. Hx of knee replacement R knee.   Medications patient has been prescribed: None  Taking:      Reviewed prior external information including notes and imaging from previsou exam, outside providers and external EMR if available.   As well as notes that were available from care everywhere and other healthcare systems.  Past medical history, social, surgical and family history all reviewed in electronic medical record.  No pertanent information unless stated regarding to the chief complaint.   Past Medical History:  Diagnosis Date   Achilles tendinitis    Arthritis    Asthma    Clotting disorder (HCC)    Diverticulitis    Diverticulosis    GERD (gastroesophageal reflux disease)    Heart murmur    as a child   History of blood clots    History of chicken pox    History of pulmonary embolus (PE) 2018   Hyperlipidemia    Hypertension    Liver cirrhosis secondary to NASH (nonalcoholic steatohepatitis) (HCC)    Osteoarthritis    Phlebitis    Plantar fasciitis    Sleep apnea    not wearing CPAP at this time   Vertigo     No Known Allergies   Review of Systems:  No headache, visual changes, nausea, vomiting, diarrhea, constipation, dizziness, abdominal pain, skin rash, fevers, chills, night sweats, weight loss, swollen lymph nodes, body aches, joint swelling, chest pain, shortness of breath, mood changes. POSITIVE  muscle aches  Objective  Blood pressure 124/84, pulse 74, height 5\' 6"  (1.676 m), weight 216 lb (98 kg), SpO2 98 %.   General: No apparent distress alert and oriented x3 mood and affect normal, dressed appropriately.  HEENT: Pupils equal, extraocular movements intact  Respiratory: Patient's speak in full sentences and does not appear short of breath  Cardiovascular: No lower extremity edema, non tender, no erythema  Gait mild antalgic MSK:  Back does have some loss lordosis noted.  Some tenderness to palpation in the paraspinal musculature patient does have tightness around the sacroiliac joints bilaterally. Bilateral knees do have arthritic changes of the left knee noted and post replacement on the right knee.  Does have unfortunately crepitus of both knees noted today.  Trace effusion noted of the left knee.  Some instability with valgus and varus force  Osteopathic findings  C6 flexed rotated and side bent left T3 extended rotated and side bent right inhaled rib T9 extended rotated and side bent left L2 flexed rotated and side bent right Sacrum right on right       Assessment and Plan:  Chronic low back pain with left-sided sciatica Chronic discomfort but doing better with her weight loss that I do think is helping at this moment.  Patient has been stable for the last month and her weight loss plan encouraged her to continue to increase activity slowly.  Discussed icing regimen and home exercises.  Discussed which activities to do and which ones to avoid.  Increase activity slowly.  Follow-up again in 6 to 8 weeks responding relatively well to osteopathic manipulation today.  Degenerative arthritis of left knee Degenerative arthritis of the left knee.  Discussed with patient about the possibility of injection which patient declined.  Would consider the possibility of PRP.  Patient will think about it and then consider it.  Contralateral knee does have pain as well and does have a  replacement.  Will need to monitor and get x-rays at the moment.  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  No significant swelling.  Will monitor.  Will get x-rays today and depending on findings we will need a bone scan if we are concerned for any instability or loosening.  Follow-up with me again in 6 to 8 weeks otherwise.    Nonallopathic problems  Decision today to treat with OMT was based on Physical Exam  After verbal consent patient was treated with HVLA, ME, FPR techniques in cervical, rib, thoracic, lumbar, and sacral  areas  Patient tolerated the procedure well with improvement in symptoms  Patient given exercises, stretches and lifestyle modifications  See medications in patient instructions if given  Patient will follow up in 4-8 weeks    The above documentation has been reviewed and is accurate and complete Judi Saa, DO          Note: This dictation was prepared with Dragon dictation along with smaller phrase technology. Any transcriptional errors that result from this process are unintentional.

## 2022-07-17 ENCOUNTER — Ambulatory Visit: Payer: Medicare Other | Admitting: Family Medicine

## 2022-07-17 ENCOUNTER — Encounter: Payer: Self-pay | Admitting: Family Medicine

## 2022-07-17 ENCOUNTER — Ambulatory Visit (INDEPENDENT_AMBULATORY_CARE_PROVIDER_SITE_OTHER): Payer: Medicare Other

## 2022-07-17 VITALS — BP 124/84 | HR 74 | Ht 66.0 in | Wt 216.0 lb

## 2022-07-17 DIAGNOSIS — M1712 Unilateral primary osteoarthritis, left knee: Secondary | ICD-10-CM | POA: Diagnosis not present

## 2022-07-17 DIAGNOSIS — M25561 Pain in right knee: Secondary | ICD-10-CM

## 2022-07-17 DIAGNOSIS — M25562 Pain in left knee: Secondary | ICD-10-CM

## 2022-07-17 DIAGNOSIS — M9902 Segmental and somatic dysfunction of thoracic region: Secondary | ICD-10-CM

## 2022-07-17 DIAGNOSIS — G8929 Other chronic pain: Secondary | ICD-10-CM | POA: Diagnosis not present

## 2022-07-17 DIAGNOSIS — Z471 Aftercare following joint replacement surgery: Secondary | ICD-10-CM | POA: Diagnosis not present

## 2022-07-17 DIAGNOSIS — T8484XA Pain due to internal orthopedic prosthetic devices, implants and grafts, initial encounter: Secondary | ICD-10-CM | POA: Diagnosis not present

## 2022-07-17 DIAGNOSIS — M9901 Segmental and somatic dysfunction of cervical region: Secondary | ICD-10-CM | POA: Diagnosis not present

## 2022-07-17 DIAGNOSIS — M9903 Segmental and somatic dysfunction of lumbar region: Secondary | ICD-10-CM

## 2022-07-17 DIAGNOSIS — M5442 Lumbago with sciatica, left side: Secondary | ICD-10-CM | POA: Diagnosis not present

## 2022-07-17 DIAGNOSIS — Z96651 Presence of right artificial knee joint: Secondary | ICD-10-CM

## 2022-07-17 DIAGNOSIS — M9908 Segmental and somatic dysfunction of rib cage: Secondary | ICD-10-CM

## 2022-07-17 DIAGNOSIS — M9904 Segmental and somatic dysfunction of sacral region: Secondary | ICD-10-CM

## 2022-07-17 NOTE — Assessment & Plan Note (Signed)
Degenerative arthritis of the left knee.  Discussed with patient about the possibility of injection which patient declined.  Would consider the possibility of PRP.  Patient will think about it and then consider it.  Contralateral knee does have pain as well and does have a replacement.  Will need to monitor and get x-rays at the moment.  Follow-up with me again in 6 to 8 weeks

## 2022-07-17 NOTE — Assessment & Plan Note (Signed)
Pain in the right knee, on exam there is some crepitus.  No significant swelling.  Will monitor.  Will get x-rays today and depending on findings we will need a bone scan if we are concerned for any instability or loosening.  Follow-up with me again in 6 to 8 weeks otherwise.

## 2022-07-17 NOTE — Assessment & Plan Note (Signed)
Chronic discomfort but doing better with her weight loss that I do think is helping at this moment.  Patient has been stable for the last month and her weight loss plan encouraged her to continue to increase activity slowly.  Discussed icing regimen and home exercises.  Discussed which activities to do and which ones to avoid.  Increase activity slowly.  Follow-up again in 6 to 8 weeks responding relatively well to osteopathic manipulation today.

## 2022-07-17 NOTE — Patient Instructions (Addendum)
Xray today Good to see you! Keep doing squats but on a bench See you again in 2-3 months

## 2022-08-19 DIAGNOSIS — R7303 Prediabetes: Secondary | ICD-10-CM | POA: Diagnosis not present

## 2022-08-19 DIAGNOSIS — E559 Vitamin D deficiency, unspecified: Secondary | ICD-10-CM | POA: Diagnosis not present

## 2022-08-19 DIAGNOSIS — I10 Essential (primary) hypertension: Secondary | ICD-10-CM | POA: Diagnosis not present

## 2022-08-19 DIAGNOSIS — K760 Fatty (change of) liver, not elsewhere classified: Secondary | ICD-10-CM | POA: Diagnosis not present

## 2022-08-19 DIAGNOSIS — E782 Mixed hyperlipidemia: Secondary | ICD-10-CM | POA: Diagnosis not present

## 2022-08-19 LAB — HEMOGLOBIN A1C
EGFR: 88
Hemoglobin A1C: 5.6

## 2022-10-15 DIAGNOSIS — E782 Mixed hyperlipidemia: Secondary | ICD-10-CM | POA: Diagnosis not present

## 2022-10-15 DIAGNOSIS — R7303 Prediabetes: Secondary | ICD-10-CM | POA: Diagnosis not present

## 2022-10-15 DIAGNOSIS — R0789 Other chest pain: Secondary | ICD-10-CM | POA: Diagnosis not present

## 2022-10-15 DIAGNOSIS — R051 Acute cough: Secondary | ICD-10-CM | POA: Diagnosis not present

## 2022-10-15 DIAGNOSIS — J018 Other acute sinusitis: Secondary | ICD-10-CM | POA: Diagnosis not present

## 2022-10-15 DIAGNOSIS — K760 Fatty (change of) liver, not elsewhere classified: Secondary | ICD-10-CM | POA: Diagnosis not present

## 2022-10-15 DIAGNOSIS — I10 Essential (primary) hypertension: Secondary | ICD-10-CM | POA: Diagnosis not present

## 2022-10-15 NOTE — Progress Notes (Unsigned)
[name] [address] [place] [address] Phone: [phone] Subjective:   Isabel Garcia, am serving as a scribe for Dr. Antoine Primas.  I'm seeing this patient by the request  of:  Anabel Halon, MD  CC: Back and neck pain follow-up, knee pain  [id number]  Isabel Garcia is a 67 y.o. female coming in with complaint of back and neck pain. OMT 07/17/2022. Also seen for B knee pain. Patient states that her lower back is tight.   Knee pain is similar pain to previous visits.   Also c/o R ankle pain. Has been wearing brace recently for more support.   Medications patient has been prescribed:   Taking:         Reviewed prior external information including notes and imaging from previsou exam, outside providers and external EMR if available.   As well as notes that were available from care everywhere and other healthcare systems.  Past medical history, social, surgical and family history all reviewed in electronic medical record.  No pertanent information unless stated regarding to the chief complaint.   Past Medical History:  Diagnosis Date   Achilles tendinitis    Arthritis    Asthma    Clotting disorder (HCC)    Diverticulitis    Diverticulosis    GERD (gastroesophageal reflux disease)    Heart murmur    as a child   History of blood clots    History of chicken pox    History of pulmonary embolus (PE) 2018   Hyperlipidemia    Hypertension    Liver cirrhosis secondary to NASH (nonalcoholic steatohepatitis) (HCC)    Osteoarthritis    Phlebitis    Plantar fasciitis    Sleep apnea    not wearing CPAP at this time   Vertigo     No Known Allergies   Review of Systems:  No headache, visual changes, nausea, vomiting, diarrhea, constipation, dizziness, abdominal pain, skin rash, fevers, chills, night sweats, weight loss, swollen lymph nodes, body aches, joint swelling,  shortness of breath, mood changes. POSITIVE muscle  aches, intermittent chest discomfort but nothing that stopping her from activity.  Objective  Blood pressure 112/72, pulse 78, height 5\' 6"  (1.676 m), weight 224 lb (101.6 kg), SpO2 98%.   General: No apparent distress alert and oriented x3 mood and affect normal, dressed appropriately.  HEENT: Pupils equal, extraocular movements intact  Respiratory: Patient's speak in full sentences and does not appear short of breath  Cardiovascular: No lower extremity edema, non tender, no erythema  Back exam does have some loss of lordosis noted.  Patient does have some tenderness to palpation more over the right sacroiliac joint.  Fairly severe at the moment.  Patient does have tightness noted in the right thoracolumbar juncture as well.  Osteopathic findings  C3 flexed rotated and side bent right C7 flexed rotated and side bent left T3 extended rotated and side bent right inhaled rib T9 extended rotated and side bent right L2 flexed rotated and side bent right Sacrum right on right       Assessment and Plan:  Chronic low back pain with left-sided sciatica Chronic pain that usually on the left side but seems to be exacerbated on the right side at the moment.  Discussed icing regimen and home exercises again.  Patient will make no significant changes in the medications at the moment.  Follow-up again in 6 to 8 weeks otherwise.  Family history of  heart disease Strong family history of heart disease.  Has not seen cardiologist.  Was seen by another provider who sent her to urgent care immediately and had no other follow-up.  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  Would refer patient for further risk management.    Nonallopathic problems  Decision today to treat with OMT was based on Physical Exam  After verbal consent patient was treated with HVLA, ME, FPR techniques in cervical, rib, thoracic, lumbar, and sacral  areas  Patient  tolerated the procedure well with improvement in symptoms  Patient given exercises, stretches and lifestyle modifications  See medications in patient instructions if given  Patient will follow up in 4-8 weeks      The above documentation has been reviewed and is accurate and complete Judi Saa, DO        Note: This dictation was prepared with Dragon dictation along with smaller phrase technology. Any transcriptional errors that result from this process are unintentional.

## 2022-10-16 ENCOUNTER — Encounter: Payer: Self-pay | Admitting: Family Medicine

## 2022-10-16 ENCOUNTER — Ambulatory Visit: Payer: Medicare Other | Admitting: Family Medicine

## 2022-10-16 VITALS — BP 112/72 | HR 78 | Ht 66.0 in | Wt 224.0 lb

## 2022-10-16 DIAGNOSIS — R9431 Abnormal electrocardiogram [ECG] [EKG]: Secondary | ICD-10-CM

## 2022-10-16 DIAGNOSIS — M5442 Lumbago with sciatica, left side: Secondary | ICD-10-CM

## 2022-10-16 DIAGNOSIS — M9901 Segmental and somatic dysfunction of cervical region: Secondary | ICD-10-CM | POA: Diagnosis not present

## 2022-10-16 DIAGNOSIS — G8929 Other chronic pain: Secondary | ICD-10-CM | POA: Diagnosis not present

## 2022-10-16 DIAGNOSIS — M9908 Segmental and somatic dysfunction of rib cage: Secondary | ICD-10-CM | POA: Diagnosis not present

## 2022-10-16 DIAGNOSIS — Z8249 Family history of ischemic heart disease and other diseases of the circulatory system: Secondary | ICD-10-CM

## 2022-10-16 DIAGNOSIS — M9902 Segmental and somatic dysfunction of thoracic region: Secondary | ICD-10-CM | POA: Diagnosis not present

## 2022-10-16 DIAGNOSIS — M9904 Segmental and somatic dysfunction of sacral region: Secondary | ICD-10-CM | POA: Diagnosis not present

## 2022-10-16 DIAGNOSIS — M9903 Segmental and somatic dysfunction of lumbar region: Secondary | ICD-10-CM

## 2022-10-16 NOTE — Assessment & Plan Note (Signed)
Strong family history of heart disease.  Has not seen cardiologist.  Was seen by another provider who sent her to urgent care immediately and had no other follow-up.  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  Would refer patient for further risk management.

## 2022-10-16 NOTE — Patient Instructions (Signed)
Cardiology referral  Watch the other symptoms.  If worsening chest discomfort or anything concern please go to emergency room  See me again in 6-8 weeks

## 2022-10-16 NOTE — Assessment & Plan Note (Signed)
Chronic pain that usually on the left side but seems to be exacerbated on the right side at the moment.  Discussed icing regimen and home exercises again.  Patient will make no significant changes in the medications at the moment.  Follow-up again in 6 to 8 weeks otherwise.

## 2022-11-10 DIAGNOSIS — I10 Essential (primary) hypertension: Secondary | ICD-10-CM | POA: Diagnosis not present

## 2022-11-10 DIAGNOSIS — R7303 Prediabetes: Secondary | ICD-10-CM | POA: Diagnosis not present

## 2022-11-10 DIAGNOSIS — K760 Fatty (change of) liver, not elsewhere classified: Secondary | ICD-10-CM | POA: Diagnosis not present

## 2022-11-10 DIAGNOSIS — E782 Mixed hyperlipidemia: Secondary | ICD-10-CM | POA: Diagnosis not present

## 2022-11-13 ENCOUNTER — Ambulatory Visit: Payer: Medicare Other | Admitting: Internal Medicine

## 2022-11-13 ENCOUNTER — Encounter: Payer: Self-pay | Admitting: Internal Medicine

## 2022-11-13 VITALS — BP 137/86 | HR 81 | Ht 66.0 in | Wt 218.6 lb

## 2022-11-13 DIAGNOSIS — I10 Essential (primary) hypertension: Secondary | ICD-10-CM | POA: Diagnosis not present

## 2022-11-13 DIAGNOSIS — Z23 Encounter for immunization: Secondary | ICD-10-CM

## 2022-11-13 DIAGNOSIS — R9431 Abnormal electrocardiogram [ECG] [EKG]: Secondary | ICD-10-CM

## 2022-11-13 DIAGNOSIS — J4521 Mild intermittent asthma with (acute) exacerbation: Secondary | ICD-10-CM | POA: Diagnosis not present

## 2022-11-13 DIAGNOSIS — R0789 Other chest pain: Secondary | ICD-10-CM | POA: Diagnosis not present

## 2022-11-13 DIAGNOSIS — Z86711 Personal history of pulmonary embolism: Secondary | ICD-10-CM | POA: Diagnosis not present

## 2022-11-13 DIAGNOSIS — R7303 Prediabetes: Secondary | ICD-10-CM | POA: Diagnosis not present

## 2022-11-13 DIAGNOSIS — Z7901 Long term (current) use of anticoagulants: Secondary | ICD-10-CM | POA: Diagnosis not present

## 2022-11-13 DIAGNOSIS — Z0001 Encounter for general adult medical examination with abnormal findings: Secondary | ICD-10-CM

## 2022-11-13 DIAGNOSIS — Z13820 Encounter for screening for osteoporosis: Secondary | ICD-10-CM

## 2022-11-13 MED ORDER — FLUTICASONE-SALMETEROL 250-50 MCG/ACT IN AEPB: 1.0000 | INHALATION_SPRAY | Freq: Two times a day (BID) | RESPIRATORY_TRACT | 5 refills | Status: AC

## 2022-11-13 MED ORDER — AMLODIPINE BESYLATE 10 MG PO TABS
10.0000 mg | ORAL_TABLET | Freq: Every day | ORAL | 5 refills | Status: DC
Start: 2022-11-13 — End: 2023-05-14

## 2022-11-13 NOTE — Assessment & Plan Note (Signed)
EKG in urgent care showed interventricular conduction delay Explained that her chest pain is unrelated to EKG finding Referred to Cardiology in [place] as per patient request

## 2022-11-13 NOTE — Assessment & Plan Note (Signed)
On Eliquis for history of unprovoked PE Last CBC reviewed, Hb stable ~13 No signs of active bleeding

## 2022-11-13 NOTE — Assessment & Plan Note (Signed)
Had added Advair as maintenance treatment, needs to be compliant Albuterol inhaler as needed for dyspnea or wheezing Tessalon PRN for cough Chest and back pain likely due to inflammatory cough

## 2022-11-13 NOTE — Progress Notes (Signed)
Established Patient Office Visit  Subjective:  Patient ID: Isabel Garcia, female    DOB: [birth year]  Age: 67 y.o. MRN: [id number]  CC:  Chief Complaint  Patient presents with   Annual Exam    HPI Isabel Garcia is a 67 y.o. female with past medical history of HTN, OSA, GERD, PE/DVT and obesity who presents for annual physical.  HTN: BP is well-controlled. Takes medications regularly. Patient denies headache, dizziness, dyspnea or palpitations.   Asthma: She has chronic cough.  She uses albuterol inhaler as needed for dyspnea or wheezing.  Denies any recent fever or chills.  She has mild left upper chest wall pain, which is intermittent, has chest wall tenderness and pain is worse with deep breathing.  She was recently referred to urgent care for chest pain by Dr. Jeanice Lim.  Her EKG showed sinus rhythm, interventricular conduction delay.  She was referred to cardiology in [facility] by Dr. Antoine Primas, but referred to see cardiology in [place].  She takes omeprazole as needed for GERD, but has not required it recently.  She denies any dysphagia or odynophagia currently.   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  She is supposed to be on indefinite AC, and is on Eliquis.  She sees Dr. Antoine Primas for chronic low back pain.  She takes Mobic for it.  She also sees Dr. Jeanice Lim for weight management.     Past Medical History:  Diagnosis Date   Achilles tendinitis    Arthritis    Asthma    Clotting disorder (HCC)    Diverticulitis    Diverticulosis    GERD (gastroesophageal reflux disease)    Heart murmur    as a child   History of blood clots    History of chicken pox    History of pulmonary embolus (PE) 2018   Hyperlipidemia    Hypertension    Liver cirrhosis secondary to NASH (nonalcoholic steatohepatitis) (HCC)    Osteoarthritis    Phlebitis    Plantar fasciitis    Sleep apnea    not wearing CPAP at this time   Vertigo      Past Surgical History:  Procedure Laterality Date   ABDOMINAL HYSTERECTOMY     bone spur removal     COLONOSCOPY     KNEE ARTHROSCOPY Right    KNEE ARTHROSCOPY Right    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   Procedure: TOTAL KNEE ARTHROPLASTY;  Surgeon: Durene Romans, MD;  Location: [facility];  Service: Orthopedics;  Laterality: Right;  70 mins    Family History  Problem Relation Age of Onset   Arthritis Mother    Hypertension Mother    Hypertension Father    Heart attack Father    Early death Father    Asthma Sister    Heart attack Brother    Early death Brother    Hypertension Brother    Stroke Maternal Grandmother    Heart attack Maternal Grandfather    Heart attack Paternal Grandfather    Early death Paternal Grandfather    Colon cancer Neg Hx    Stomach cancer Neg Hx    Pancreatic cancer Neg Hx    Esophageal cancer Neg Hx    Rectal cancer Neg Hx     Social History   Socioeconomic History   Marital status: Divorced    Spouse name: Not on file   Number of children: 1   Years of  education: Not on file   Highest education level: Not on file  Occupational History   Occupation: Disabled  Tobacco Use   Smoking status: Never   Smokeless tobacco: Never  Vaping Use   Vaping status: Never Used  Substance and Sexual Activity   Alcohol use: Not Currently   Drug use: No   Sexual activity: Not Currently  Other Topics Concern   Not on file  Social History Narrative   Not on file   Social Determinants of Health   Financial Resource Strain: Low Risk  (06/09/2022)   Overall Financial Resource Strain (CARDIA)    Difficulty of Paying Living Expenses: Not hard at all  Food Insecurity: No Food Insecurity (06/09/2022)   Hunger Vital Sign    Worried About Running Out of Food in the Last Year: Never true    Ran Out of Food in the Last Year: Never true  Transportation Needs: No Transportation Needs (06/09/2022)   PRAPARE - Administrator, Civil Service  (Medical): No    Lack of Transportation (Non-Medical): No  Physical Activity: Sufficiently Active (06/09/2022)   Exercise Vital Sign    Days of Exercise per Week: 5 days    Minutes of Exercise per Session: 60 min  Stress: No Stress Concern Present (06/09/2022)   [employer] of Occupational Health - Occupational Stress Questionnaire    Feeling of Stress : Not at all  Social Connections: Moderately Integrated (06/09/2022)   Social Connection and Isolation Panel [NHANES]    Frequency of Communication with Friends and Family: More than three times a week    Frequency of Social Gatherings with Friends and Family: More than three times a week    Attends Religious Services: More than 4 times per year    Active Member of [employer] or Organizations: Yes    Attends Engineer, structural: More than 4 times per year    Marital Status: Divorced  Intimate Partner Violence: Not At Risk (06/09/2022)   Humiliation, Afraid, Rape, and Kick questionnaire    Fear of Current or Ex-Partner: No    Emotionally Abused: No    Physically Abused: No    Sexually Abused: No    Outpatient Medications Prior to Visit  Medication Sig Dispense Refill   albuterol (VENTOLIN HFA) 108 (90 Base) MCG/ACT inhaler Inhale 2 puffs into the lungs every 6 (six) hours as needed for wheezing or shortness of breath.      apixaban (ELIQUIS) 5 MG TABS tablet Take 1 tablet (5 mg total) by mouth 2 (two) times daily. 60 tablet 5   benzonatate (TESSALON) 100 MG capsule Take 1 capsule (100 mg total) by mouth 2 (two) times daily as needed for cough. 30 capsule 1   COD LIVER OIL PO Take 1 capsule by mouth daily.     cyclobenzaprine (FLEXERIL) 5 MG tablet Take 1 tablet (5 mg total) by mouth 2 (two) times daily as needed for muscle spasms. 30 tablet 1   fluticasone (FLONASE) 50 MCG/ACT nasal spray Place 2 sprays into both nostrils daily. 16 g 11   Iron-Vitamin C (IRON 100/C) 100-250 MG TABS Take by mouth.     lidocaine (LIDODERM) 5 % Place 1  patch onto the skin daily. Remove & Discard patch within 12 hours or as directed by MD 30 patch 0   meloxicam (MOBIC) 15 MG tablet Take 1 tablet (15 mg total) by mouth daily. 30 tablet 0   Zinc Sulfate (ZINC 15 PO) Take 1 capsule by  mouth daily.     amLODipine (NORVASC) 10 MG tablet Take 1 tablet (10 mg total) by mouth daily. Take 1 tablet (10 mg total) daily for high blood pressure. 30 tablet 5   cholecalciferol (VITAMIN D3) 25 MCG (1000 UNIT) tablet Take 1,000 Units by mouth daily.     fluticasone-salmeterol (ADVAIR DISKUS) 250-50 MCG/ACT AEPB Inhale 1 puff into the lungs in the morning and at bedtime. 60 each 5   No facility-administered medications prior to visit.    No Known Allergies  ROS Review of Systems  Constitutional:  Negative for chills and fever.  HENT:  Negative for congestion, sinus pressure, sinus pain and sore throat.   Eyes:  Negative for pain and discharge.  Respiratory:  Positive for cough. Negative for shortness of breath.   Cardiovascular:  Positive for chest pain. Negative for palpitations.  Gastrointestinal:  Negative for abdominal pain, diarrhea, nausea and vomiting.  Endocrine: Negative for polydipsia and polyuria.  Genitourinary:  Negative for dysuria and hematuria.  Musculoskeletal:  Positive for back pain. Negative for neck pain and neck stiffness.  Skin:  Negative for rash.  Neurological:  Negative for dizziness and weakness.  Psychiatric/Behavioral:  Negative for agitation and behavioral problems.       Objective:    Physical Exam Vitals reviewed.  Constitutional:      General: She is not in acute distress.    Appearance: She is not diaphoretic.  HENT:     Head: Normocephalic and atraumatic.     Nose: Nose normal.     Mouth/Throat:     Mouth: Mucous membranes are moist.  Eyes:     General: No scleral icterus.    Extraocular Movements: Extraocular movements intact.  Cardiovascular:     Rate and Rhythm: Normal rate and regular rhythm.      Heart sounds: Normal heart sounds. No murmur heard. Pulmonary:     Breath sounds: Normal breath sounds. No wheezing or rales.  Abdominal:     Palpations: Abdomen is soft.     Tenderness: There is no abdominal tenderness.  Musculoskeletal:     Cervical back: Neck supple. No tenderness.     Right lower leg: No edema.     Left lower leg: No edema.  Skin:    General: Skin is warm.     Findings: No rash.  Neurological:     General: No focal deficit present.     Mental Status: She is alert and oriented to person, place, and time.     Cranial Nerves: No cranial nerve deficit.     Sensory: No sensory deficit.     Motor: No weakness.  Psychiatric:        Mood and Affect: Mood normal.        Behavior: Behavior normal.     BP 137/86 (BP Location: Right Arm, Patient Position: Sitting, Cuff Size: Large)   Pulse 81   Ht 5\' 6"  (1.676 m)   Wt 218 lb 9.6 oz (99.2 kg)   SpO2 97%   BMI 35.28 kg/m  Wt Readings from Last 3 Encounters:  11/13/22 218 lb 9.6 oz (99.2 kg)  10/16/22 224 lb (101.6 kg)  07/17/22 216 lb (98 kg)    Lab Results  Component Value Date   TSH 1.280 04/11/2022   Lab Results  Component Value Date   WBC 4.3 06/04/2022   HGB 13.0 06/04/2022   HCT 38.0 06/04/2022   MCV 90.9 06/04/2022   PLT 273 06/04/2022   Lab Results  Component Value Date   NA 139 04/11/2022   K 3.9 04/11/2022   CO2 23 04/11/2022   GLUCOSE 95 04/11/2022   BUN 10 04/11/2022   CREATININE 0.80 04/11/2022   BILITOT 0.6 04/11/2022   ALKPHOS 91 04/11/2022   AST 36 04/11/2022   ALT 36 (H) 04/11/2022   PROT 7.5 04/11/2022   ALBUMIN 4.5 04/11/2022   CALCIUM 9.9 04/11/2022   ANIONGAP 9 06/08/2019   EGFR 82 04/11/2022   GFR 83.37 01/22/2021   Lab Results  Component Value Date   CHOL 198 04/11/2022   Lab Results  Component Value Date   HDL 57 04/11/2022   Lab Results  Component Value Date   LDLCALC 126 (H) 04/11/2022   Lab Results  Component Value Date   TRIG 80 04/11/2022   Lab  Results  Component Value Date   CHOLHDL 3.5 04/11/2022   Lab Results  Component Value Date   HGBA1C 5.8 (H) 04/11/2022      Assessment & Plan:   Problem List Items Addressed This Visit       Cardiovascular and Mediastinum   Essential hypertension    BP Readings from Last 1 Encounters:  11/13/22 137/86   Well-controlled with amlodipine 10 mg QD Counseled for compliance with the medications Advised DASH diet and moderate exercise/walking, at least 150 mins/week      Relevant Medications   amLODipine (NORVASC) 10 MG tablet     Respiratory   Asthma    Had added Advair as maintenance treatment, needs to be compliant Albuterol inhaler as needed for dyspnea or wheezing Tessalon PRN for cough Chest and back pain likely due to inflammatory cough      Relevant Medications   fluticasone-salmeterol (ADVAIR DISKUS) 250-50 MCG/ACT AEPB     Other   History of pulmonary embolus (PE)    Chart review suggests history of unprovoked PE/DVT in the past Supposed to be on indefinite AC, on Eliquis Had referred to heme-onc for further discussion      Chronic anticoagulation    On Eliquis for history of unprovoked PE Last CBC reviewed, Hb stable ~13 No signs of active bleeding      Encounter for general adult medical examination with abnormal findings - Primary    Physical exam as documented. Fasting blood tests ordered.      Prediabetes    Lab Results  Component Value Date   HGBA1C 5.8 (H) 04/11/2022   Advised to follow DASH diet for HTN      Abnormal EKG    EKG in urgent care showed interventricular conduction delay Explained that her chest pain is unrelated to EKG finding Referred to Cardiology in [facility] as per patient request      Relevant Orders   Ambulatory referral to Cardiology   Atypical chest pain    Her chest pain is more pleuritic in nature considering her asthma She was referred to urgent care by a different provider, which showed sinus rhythm  without any signs of active ischemia      Relevant Orders   Ambulatory referral to Cardiology   Other Visit Diagnoses     Osteoporosis screening       Relevant Orders   DG Bone Density   Encounter for immunization       Relevant Orders   Flu Vaccine Trivalent High Dose (Fluad) (Completed)   Pneumococcal conjugate vaccine 20-valent (Completed)       Meds ordered this encounter  Medications   fluticasone-salmeterol (ADVAIR DISKUS) 250-50  MCG/ACT AEPB    Sig: Inhale 1 puff into the lungs in the morning and at bedtime.    Dispense:  60 each    Refill:  5   amLODipine (NORVASC) 10 MG tablet    Sig: Take 1 tablet (10 mg total) by mouth daily.    Dispense:  30 tablet    Refill:  5    Follow-up: Return in about 6 months (around 05/14/2023) for HTN and asthma.    Anabel Halon, MD

## 2022-11-13 NOTE — Assessment & Plan Note (Signed)
BP Readings from Last 1 Encounters:  11/13/22 137/86   Well-controlled with amlodipine 10 mg QD Counseled for compliance with the medications Advised DASH diet and moderate exercise/walking, at least 150 mins/week

## 2022-11-13 NOTE — Assessment & Plan Note (Signed)
Physical exam as documented. Fasting blood tests ordered. 

## 2022-11-13 NOTE — Assessment & Plan Note (Signed)
Chart review suggests history of unprovoked PE/DVT in the past Supposed to be on indefinite AC, on Eliquis Had referred to heme-onc for further discussion

## 2022-11-13 NOTE — Assessment & Plan Note (Signed)
Lab Results  Component Value Date   HGBA1C 5.8 (H) 04/11/2022   Advised to follow DASH diet for HTN

## 2022-11-13 NOTE — Assessment & Plan Note (Signed)
Her chest pain is more pleuritic in nature considering her asthma She was referred to urgent care by a different provider, which showed sinus rhythm without any signs of active ischemia

## 2022-11-13 NOTE — Patient Instructions (Addendum)
Please continue to take medications as prescribed.  Please continue to follow low salt diet and perform moderate exercise/walking at least 150 mins/week. 

## 2022-11-26 NOTE — Progress Notes (Unsigned)
[facility] [address] [place] [address] Phone: [phone] Subjective:   Isabel Garcia, am serving as a scribe for Dr. Antoine Garcia.  I'm seeing this patient by the request  of:  Isabel Halon, MD  CC: back and neck pain follow up   [id number]  Isabel Garcia is a 67 y.o. female coming in with complaint of back and neck pain. OMT on 10/16/2022. Patient states that she is stiff today. Has been very active standing a lot for homecoming. Swelling in legs and feet during that time.   Also c/o R ankle pain surrounding joint. Pain will cause instability.          Reviewed prior external information including notes and imaging from previsou exam, outside providers and external EMR if available.   As well as notes that were available from care everywhere and other healthcare systems.  Past medical history, social, surgical and family history all reviewed in electronic medical record.  No pertanent information unless stated regarding to the chief complaint.   Past Medical History:  Diagnosis Date   Achilles tendinitis    Arthritis    Asthma    Clotting disorder (HCC)    Diverticulitis    Diverticulosis    GERD (gastroesophageal reflux disease)    Heart murmur    as a child   History of blood clots    History of chicken pox    History of pulmonary embolus (PE) 2018   Hyperlipidemia    Hypertension    Liver cirrhosis secondary to NASH (nonalcoholic steatohepatitis) (HCC)    Osteoarthritis    Phlebitis    Plantar fasciitis    Sleep apnea    not wearing CPAP at this time   Vertigo     No Known Allergies   Review of Systems:  No headache, visual changes, nausea, vomiting, diarrhea, constipation, dizziness, abdominal pain, skin rash, fevers, chills, night sweats, weight loss, swollen lymph nodes, body aches, joint swelling, chest pain, shortness of breath, mood changes. POSITIVE muscle aches  Objective  Blood pressure 110/74,  pulse 70, height 5\' 6"  (1.676 m), weight 219 lb (99.3 kg), SpO2 98%.   General: No apparent distress alert and oriented x3 mood and affect normal, dressed appropriately.  HEENT: Pupils equal, extraocular movements intact  Respiratory: Patient's speak in full sentences and does not appear short of breath  Cardiovascular: No lower extremity edema, non tender, no erythema  Low back does have some loss lordosis.  Some tenderness to palpation more over the sacroiliac joint left greater than right.  Osteopathic findings  C3 flexed rotated and side bent right C7 flexed rotated and side bent left T3 extended rotated and side bent right inhaled rib T6 extended rotated and side bent left L3 flexed rotated and side bent left Sacrum left on left   Foot exam does show the patient does have a narrow heel noted.  Mild widening of the forefoot.    Assessment and Plan:  Left tibialis posterior tendinitis Patient does have the posterior tibialis tendinitis but at the moment I think it is secondary to porch shoe choices.  We discussed which ones over-the-counter would be more beneficial.  Discussed even potential orthotics.  Do not feel that injections are necessary at the moment but will consider in the near future if necessary.  Follow-up again in 6 to 8 weeks.  Chronic low back pain with left-sided sciatica Continue to have difficulty on the left side of  the left sacroiliac joint and the L4 area.  Discussed with patient about posture and ergonomics.  Which activities to do and which ones to avoid.  Follow-up again in 6 to 8 weeks    Nonallopathic problems  Decision today to treat with OMT was based on Physical Exam  After verbal consent patient was treated with HVLA, ME, FPR techniques in cervical, rib, thoracic, lumbar, and sacral  areas  Patient tolerated the procedure well with improvement in symptoms  Patient given exercises, stretches and lifestyle modifications  See medications in  patient instructions if given  Patient will follow up in 4-8 weeks     The above documentation has been reviewed and is accurate and complete Isabel Saa, DO         Note: This dictation was prepared with Dragon dictation along with smaller phrase technology. Any transcriptional errors that result from this process are unintentional.

## 2022-12-02 ENCOUNTER — Encounter: Payer: Self-pay | Admitting: Family Medicine

## 2022-12-02 ENCOUNTER — Ambulatory Visit: Payer: Medicare Other | Admitting: Family Medicine

## 2022-12-02 VITALS — BP 110/74 | HR 70 | Ht 66.0 in | Wt 219.0 lb

## 2022-12-02 DIAGNOSIS — G8929 Other chronic pain: Secondary | ICD-10-CM

## 2022-12-02 DIAGNOSIS — M9902 Segmental and somatic dysfunction of thoracic region: Secondary | ICD-10-CM | POA: Diagnosis not present

## 2022-12-02 DIAGNOSIS — M9904 Segmental and somatic dysfunction of sacral region: Secondary | ICD-10-CM | POA: Diagnosis not present

## 2022-12-02 DIAGNOSIS — M76822 Posterior tibial tendinitis, left leg: Secondary | ICD-10-CM

## 2022-12-02 DIAGNOSIS — M5442 Lumbago with sciatica, left side: Secondary | ICD-10-CM

## 2022-12-02 DIAGNOSIS — M9903 Segmental and somatic dysfunction of lumbar region: Secondary | ICD-10-CM

## 2022-12-02 DIAGNOSIS — M9908 Segmental and somatic dysfunction of rib cage: Secondary | ICD-10-CM

## 2022-12-02 DIAGNOSIS — M9901 Segmental and somatic dysfunction of cervical region: Secondary | ICD-10-CM

## 2022-12-02 NOTE — Assessment & Plan Note (Signed)
Continue to have difficulty on the left side of the left sacroiliac joint and the L4 area.  Discussed with patient about posture and ergonomics.  Which activities to do and which ones to avoid.  Follow-up again in 6 to 8 weeks

## 2022-12-02 NOTE — Assessment & Plan Note (Signed)
Patient does have the posterior tibialis tendinitis but at the moment I think it is secondary to porch shoe choices.  We discussed which ones over-the-counter would be more beneficial.  Discussed even potential orthotics.  Do not feel that injections are necessary at the moment but will consider in the near future if necessary.  Follow-up again in 6 to 8 weeks.

## 2022-12-02 NOTE — Patient Instructions (Signed)
Good to see you  Altra and newton shoes Make an appointment for shockwave therapy for knees up front Continue to be active See me in 2-3 months

## 2022-12-11 DIAGNOSIS — I10 Essential (primary) hypertension: Secondary | ICD-10-CM | POA: Diagnosis not present

## 2022-12-11 DIAGNOSIS — R7303 Prediabetes: Secondary | ICD-10-CM | POA: Diagnosis not present

## 2022-12-11 DIAGNOSIS — E782 Mixed hyperlipidemia: Secondary | ICD-10-CM | POA: Diagnosis not present

## 2022-12-11 DIAGNOSIS — K760 Fatty (change of) liver, not elsewhere classified: Secondary | ICD-10-CM | POA: Diagnosis not present

## 2022-12-12 ENCOUNTER — Ambulatory Visit: Payer: Medicare Other | Admitting: Family Medicine

## 2022-12-12 VITALS — BP 128/82 | HR 68 | Ht 66.0 in | Wt 219.0 lb

## 2022-12-12 DIAGNOSIS — M25562 Pain in left knee: Secondary | ICD-10-CM | POA: Diagnosis not present

## 2022-12-12 DIAGNOSIS — M25561 Pain in right knee: Secondary | ICD-10-CM

## 2022-12-12 DIAGNOSIS — G8929 Other chronic pain: Secondary | ICD-10-CM | POA: Diagnosis not present

## 2022-12-12 NOTE — Patient Instructions (Signed)
Thank you for coming in today.   Please use Voltaren gel (Generic Diclofenac Gel) up to 4x daily for pain as needed.  This is available over-the-counter as both the name brand Voltaren gel and the generic diclofenac gel.   Work on Dance movement psychotherapist, straight leg raises.  Schedule 2-3 more shockwave treatments for the next few weeks.

## 2022-12-12 NOTE — Progress Notes (Signed)
   Rubin Payor, PhD, LAT, ATC acting as a scribe for Clementeen Graham, MD.  Isabel Garcia is a 67 y.o. female who presents to [facility] at [facility] today for bilat knee pain and ECSWT consultation. Pt was last seen by Dr. Katrinka Blazing on 12/02/22 for OMT. 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  Today, pt reports bilat knee pain that's chronic in nature. She is having pain on varying spots along the anterior aspect of both knees. No prior knee CSI  Dx imaging: 07/17/22 R & L knee XR  Pertinent review of systems: No fevers or chills  Relevant historical information: Right total knee replacement in 2021   Exam:  BP 128/82   Pulse 68   Ht 5\' 6"  (1.676 m)   Wt 219 lb (99.3 kg)   SpO2 98%   BMI 35.35 kg/m  General: Well Developed, well nourished, and in no acute distress.   MSK: Right knee mature scar anterior knee.  Mildly tender palpation overlying patellar tendon anterior knee.  Normal motion.  Left knee normal-appearing tender palpation overlying patellar tendon and distal quad tendon.  Normal motion.    Lab and Radiology Results                 Extracorporeal Shockwave Therapy Note    Patient is being treated today with ECSWT. Informed consent was obtained and patient tolerated procedure well.   Therapy performed by Clementeen Graham  Condition treated: Patellar and quadriceps tendinitis bilateral knees Treatment preset used: Tendinitis Energy used: 90 mJ Frequency used: 14 Hz Number of pulses: 4000 total 2000 each side Head Size: Medium Treatment #1 of #4    Assessment and Plan: 67 y.o. female with bilateral knee pain.  She has more than 1 reason to hurt but she likely does have some patellar tendinitis and some distal quad tendinitis which could be treated with shockwave.  She tolerated the initial sock wave treatment today quite well which indicates that this is going to potentially be helpful.  Check back in 1 week to proceed with formal shockwave  treatments.  Continue quad strengthening exercises.  Reviewed some basic home exercises that she can do.   PDMP not reviewed this encounter. No orders of the defined types were placed in this encounter.  No orders of the defined types were placed in this encounter.    Discussed warning signs or symptoms. Please see discharge instructions. Patient expresses understanding.   The above documentation has been reviewed and is accurate and complete Clementeen Graham, M.D.

## 2022-12-26 ENCOUNTER — Ambulatory Visit: Payer: Self-pay | Admitting: Family Medicine

## 2022-12-26 DIAGNOSIS — M25561 Pain in right knee: Secondary | ICD-10-CM

## 2022-12-26 DIAGNOSIS — M25562 Pain in left knee: Secondary | ICD-10-CM

## 2022-12-26 DIAGNOSIS — G8929 Other chronic pain: Secondary | ICD-10-CM

## 2022-12-26 NOTE — Progress Notes (Signed)
   [facility] [address] [place] [address] Phone: [phone]   Extracorporeal Shockwave Therapy Note    Patient is being treated today with ECSWT. Informed consent was obtained and patient tolerated procedure well.   Therapy performed by Clementeen Graham  Condition treated: Bilateral patellar and quad tendinitis Treatment preset used: [employer] used: 90 mJ right knee and left quad tendon.  50mJ left patellar tendon Frequency used: 15 Hz Number of pulses: 4000 total.  2000 each side Head Size: Medium right knee and left quad tendon.  Large left patellar tendon Treatment #2 of #4  Electronically signed by:  [facility] 11:34 AM 12/26/22

## 2023-01-06 ENCOUNTER — Telehealth: Payer: Self-pay | Admitting: *Deleted

## 2023-01-06 ENCOUNTER — Encounter: Payer: Self-pay | Admitting: Internal Medicine

## 2023-01-06 ENCOUNTER — Ambulatory Visit: Payer: Medicare Other | Attending: Internal Medicine | Admitting: Internal Medicine

## 2023-01-06 VITALS — BP 140/80 | HR 69 | Ht 66.0 in | Wt 217.4 lb

## 2023-01-06 DIAGNOSIS — R0683 Snoring: Secondary | ICD-10-CM | POA: Diagnosis not present

## 2023-01-06 NOTE — Progress Notes (Signed)
Cardiology Office Note   Date:  01/06/2023   ID:  Isabel Garcia, DOB 09-08-1955, MRN [id number]  PCP:  Anabel Halon, MD  Cardiologist:   Dietrich Pates, MD   Pt presents for eval of CP, cardiac risk assessment   History of Present Illness: Isabel Garcia is a 67 y.o. female with a history of HTN and HL She is followed by [facility] in [facility]   Complained of some CP in  Sept 2024   The pt says she has L parasternal pain that is a pressure sensaion.  Constant  Occasionally worse with deep breath  Not position.    No radiation   No associated SOB   Pt has a hx of asthma   Also has a hx of  syncope, DVT/PE [place] [address] ([place])  No predisposing cause   She has been kept on Eliquis   FHx of blood clots  But hypercoagulable work up at [facility] was negative    Then in June 2019 had another small PE  (new, small)  The pt denies CP with activity    No palpitations  No dizziness    Hx of sleep apnea   Seen in pulmonary  Could not tolerate CPAP  Not using   Diet     Recently changed: Now    Br:   Oatmeal   Greek yogurt / fruit   Water Lunch  Protein with salad  Water Dinner  Veggies , protein   Water       Current Meds  Medication Sig   albuterol (VENTOLIN HFA) 108 (90 Base) MCG/ACT inhaler Inhale 2 puffs into the lungs every 6 (six) hours as needed for wheezing or shortness of breath.    amLODipine (NORVASC) 10 MG tablet Take 1 tablet (10 mg total) by mouth daily.   apixaban (ELIQUIS) 5 MG TABS tablet Take 1 tablet (5 mg total) by mouth 2 (two) times daily.   benzonatate (TESSALON) 100 MG capsule Take 1 capsule (100 mg total) by mouth 2 (two) times daily as needed for cough.   COD LIVER OIL PO Take 1 capsule by mouth daily.   cyclobenzaprine (FLEXERIL) 5 MG tablet Take 1 tablet (5 mg total) by mouth 2 (two) times daily as needed for muscle spasms.   fluticasone (FLONASE) 50 MCG/ACT nasal spray Place 2 sprays into both nostrils daily.   fluticasone-salmeterol (ADVAIR DISKUS)  250-50 MCG/ACT AEPB Inhale 1 puff into the lungs in the morning and at bedtime.   Iron-Vitamin C (IRON 100/C) 100-250 MG TABS Take by mouth.   lidocaine (LIDODERM) 5 % Place 1 patch onto the skin daily. Remove & Discard patch within 12 hours or as directed by MD   meloxicam (MOBIC) 15 MG tablet Take 1 tablet (15 mg total) by mouth daily.   Zinc Sulfate (ZINC 15 PO) Take 1 capsule by mouth daily.     Allergies:   Patient has no known allergies.   Past Medical History:  Diagnosis Date   Achilles tendinitis    Arthritis    Asthma    Clotting disorder (HCC)    Diverticulitis    Diverticulosis    GERD (gastroesophageal reflux disease)    Heart murmur    as a child   History of blood clots    History of chicken pox    History of pulmonary embolus (PE) 2018   Hyperlipidemia    Hypertension    Liver cirrhosis secondary to NASH (nonalcoholic  steatohepatitis) (HCC)    Osteoarthritis    Phlebitis    Plantar fasciitis    Sleep apnea    not wearing CPAP at this time   Vertigo     Past Surgical History:  Procedure Laterality Date   ABDOMINAL HYSTERECTOMY     bone spur removal     COLONOSCOPY     KNEE ARTHROSCOPY Right    KNEE ARTHROSCOPY Right    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   Procedure: TOTAL KNEE ARTHROPLASTY;  Surgeon: Durene Romans, MD;  Location: [facility];  Service: Orthopedics;  Laterality: Right;  70 mins     Social History:  The patient  reports that she has never smoked. She has never used smokeless tobacco. She reports that she does not currently use alcohol. She reports that she does not use drugs.   Family History:  The patient's family history includes Arthritis in her mother; Asthma in her sister; Early death in her brother, father, and paternal grandfather; Heart attack in her brother, father, maternal grandfather, and paternal grandfather; Hypertension in her brother, father, and mother; Stroke in her maternal grandmother.    ROS:  Please see the history  of present illness. All other systems are reviewed and  Negative to the above problem except as noted.    PHYSICAL EXAM: VS:  BP (!) 140/80   Pulse 69   Ht 5\' 6"  (1.676 m)   Wt 217 lb 6.4 oz (98.6 kg)   SpO2 99%   BMI 35.09 kg/m   GEN: Obese 67 yo in no acute distress  HEENT: normal  Neck: no JVD, carotid bruits Cardiac: RRR; no murmurs Distant  No LE  edema  Chest   Mild tenderness Lparasternal region   Respiratory:  clear to auscultation GI: soft, nontender    No hepatomegaly  MS: no deformity Moving all extremities     EKG:  EKG is not ordered today.  On 10/15/22  SR   T wave inversion V1 to V3   T wave changes noted on previous EKG in V1/V2  Echo 2019  Left ventricle: The cavity size was normal. Wall thickness was    normal. Systolic function was vigorous. The estimated ejection    fraction was in the range of 65% to 70%. Wall motion was normal;    there were no regional wall motion abnormalities. Doppler    parameters are consistent with abnormal left ventricular    relaxation (grade 1 diastolic dysfunction).  - Aortic valve: Trileaflet; mildly thickened, mildly calcified    leaflets.  - Tricuspid valve: There was trivial regurgitation.   Lipid Panel    Component Value Date/Time   CHOL 198 04/11/2022 0912   TRIG 80 04/11/2022 0912   HDL 57 04/11/2022 0912   CHOLHDL 3.5 04/11/2022 0912   CHOLHDL 3 05/16/2019 1056   VLDL 10.0 05/16/2019 1056   LDLCALC 126 (H) 04/11/2022 0912      Wt Readings from Last 3 Encounters:  01/06/23 217 lb 6.4 oz (98.6 kg)  12/12/22 219 lb (99.3 kg)  12/02/22 219 lb (99.3 kg)      ASSESSMENT AND PLAN:  1  Chest pain   Atypical  Appears to be more musculoskeletal   Follow   2  Abnormal EKG   PT with T wave inversion V1-V3  She has had some of these changes in previouls EKG   I do not think the changes represent ischemia  No symptoms to sugg angina Echo in 2019 was normal  3  Hx PE  The pt has had 2 documented PE   Would keep on  Eliquis      4  HTN  BP is a little high   She says in 110s at home   Bring cuff to next visit  5  HL  LDL 136  HDL 60   Reviewed   Has made dietary changes   Would follow over time  6  OSA  Pt has been tested in past   Could not tolerate CPAP   That has been several years  Would retest with Itamar to reevluate, review options     Current medicines are reviewed at length with the patient today.  The patient does not have concerns regarding medicines.  Signed, Dietrich Pates, MD  01/06/2023 4:29 PM    [facility] [address] [place], [place], [place]  [address] Phone: [phone]; Fax: [phone]

## 2023-01-06 NOTE — Patient Instructions (Signed)
Medication Instructions:   *If you need a refill on your cardiac medications before your next appointment, please call your pharmacy*   Lab Work:  If you have labs (blood work) drawn today and your tests are completely normal, you will receive your results only by: MyChart Message (if you have MyChart) OR A paper copy in the mail If you have any lab test that is abnormal or we need to change your treatment, we will call you to review the results.   Testing/Procedures:  Itamar Sleep Study    Follow-Up: At [facility], you and your health needs are our priority.  As part of our continuing mission to provide you with exceptional heart care, we have created designated Provider Care Teams.  These Care Teams include your primary Cardiologist (physician) and Advanced Practice Providers (APPs -  Physician Assistants and Nurse Practitioners) who all work together to provide you with the care you need, when you need it.  We recommend signing up for the patient portal called "MyChart".  Sign up information is provided on this After Visit Summary.  MyChart is used to connect with patients for Virtual Visits (Telemedicine).  Patients are able to view lab/test results, encounter notes, upcoming appointments, etc.  Non-urgent messages can be sent to your provider as well.   To learn more about what you can do with MyChart, go to [email or web address].

## 2023-01-06 NOTE — Telephone Encounter (Signed)
DR. Tenny Craw ORDERED ITAMAR STUDY.   Patient agreement reviewed and signed on 01/06/2023.  WatchPAT issued to patient on 01/06/2023 by Danielle Rankin, CMA. Patient aware to not open the WatchPAT box until contacted with the activation PIN. Patient profile initialized in CloudPAT on 01/06/2023 by Danielle Rankin, CMA. Device serial number: [id number]  Please list Reason for Call as Advice Only and type "WatchPAT issued to patient" in the comment box.

## 2023-01-08 ENCOUNTER — Ambulatory Visit: Payer: Medicare Other | Admitting: Family Medicine

## 2023-01-08 DIAGNOSIS — M25561 Pain in right knee: Secondary | ICD-10-CM

## 2023-01-08 DIAGNOSIS — M25562 Pain in left knee: Secondary | ICD-10-CM

## 2023-01-08 DIAGNOSIS — G8929 Other chronic pain: Secondary | ICD-10-CM

## 2023-01-08 NOTE — Progress Notes (Signed)
   [facility] [address] [place] [address] Phone: [phone]   Extracorporeal Shockwave Therapy Note    Patient is being treated today with ECSWT. Informed consent was obtained and patient tolerated procedure well.   Therapy performed by Clementeen Graham  Condition treated: Bilateral knees Treatment preset used: Tendinitis Energy used: 90 mJ right patellar and quad tendon left quad tendon.  60 mJ left patellar tendon Frequency used: 15 hz Number of pulses: 4000 total.  1000 each location Head Size: Medium all but left patellar tendon which was large. Treatment #3 of #4  Electronically signed by:  [facility] 2:54 PM 01/08/23

## 2023-01-08 NOTE — Patient Instructions (Addendum)
Thank you for coming in today.  Keep the appointment next week.

## 2023-01-09 ENCOUNTER — Ambulatory Visit: Payer: Medicare Other | Admitting: Family Medicine

## 2023-01-15 DIAGNOSIS — E782 Mixed hyperlipidemia: Secondary | ICD-10-CM | POA: Diagnosis not present

## 2023-01-15 DIAGNOSIS — R7303 Prediabetes: Secondary | ICD-10-CM | POA: Diagnosis not present

## 2023-01-15 DIAGNOSIS — I10 Essential (primary) hypertension: Secondary | ICD-10-CM | POA: Diagnosis not present

## 2023-01-15 DIAGNOSIS — K760 Fatty (change of) liver, not elsewhere classified: Secondary | ICD-10-CM | POA: Diagnosis not present

## 2023-01-15 NOTE — Telephone Encounter (Signed)
Pt was asking if Isabel Garcia study was approved yet. I assured pt I will send a message to the sleep team to see if she has been approved,. Once approved we will call her with the PIN#. Pt thanked me for the help.

## 2023-01-15 NOTE — Telephone Encounter (Signed)
Pt has received sleep study device

## 2023-01-15 NOTE — Telephone Encounter (Signed)
Ordering provider: DR ROSS Associated diagnoses: R06.83 WatchPAT PA obtained on 01/15/2023 by Latrelle Dodrill, CMA. Authorization: No; tracking ID NO PA REQ Patient notified of PIN ([phone]) on 01/15/2023 via Notification Method: phone

## 2023-01-16 ENCOUNTER — Ambulatory Visit (INDEPENDENT_AMBULATORY_CARE_PROVIDER_SITE_OTHER): Payer: Self-pay | Admitting: Family Medicine

## 2023-01-16 DIAGNOSIS — M25561 Pain in right knee: Secondary | ICD-10-CM

## 2023-01-16 DIAGNOSIS — G8929 Other chronic pain: Secondary | ICD-10-CM

## 2023-01-16 DIAGNOSIS — M25562 Pain in left knee: Secondary | ICD-10-CM

## 2023-01-16 NOTE — Progress Notes (Signed)
   [facility] [address] [place] [address] Phone: [phone]   Extracorporeal Shockwave Therapy Note    Patient is being treated today with ECSWT. Informed consent was obtained and patient tolerated procedure well.   Therapy performed by Clementeen Graham  Condition treated: Patellar and quad tendinitis Treatment preset used: Tendinitis Energy used: 90 mJ right patellar and quad tendon and left quad tendon.  Right patellar tendon 60 mJ Frequency used: 15 Hz Number of pulses: 4000 total 1000 each tendon Head Size: Medium for all but left patellar tendon which was large Treatment #4 of #4  Electronically signed by:  [facility] 11:16 AM 01/16/23

## 2023-02-09 ENCOUNTER — Telehealth: Payer: Self-pay | Admitting: Internal Medicine

## 2023-02-09 NOTE — Telephone Encounter (Signed)
 Patient calling in about sleep study being done. Please advise

## 2023-02-09 NOTE — Telephone Encounter (Signed)
 Pt called to report that she has not done the Itamar sleep study yet since she has been sick with COVID and dealing with a lot of nasal congestion... she will give is a few more days until she is feeling much better and able to have a better night sleep. I will alert the sleep pool that the pt will need an extension on her time.

## 2023-02-11 NOTE — Progress Notes (Deleted)
  [facility] [address] [place] [address] Phone: [phone] Subjective:    I'm seeing this patient by the request  of:  Tobie Suzzane POUR, MD  CC: Back pain, neck pain follow-up back and neck pain follow-up  YEP:[id number]  Isabel Garcia is a 68 y.o. female coming in with complaint of back and neck pain Patient states   Medications patient has been prescribed:   Taking:         Reviewed prior external information including notes and imaging from previsou exam, outside providers and external EMR if available.   As well as notes that were available from care everywhere and other healthcare systems.  Past medical history, social, surgical and family history all reviewed in electronic medical record.  No pertanent information unless stated regarding to the chief complaint.   Past Medical History:  Diagnosis Date   Achilles tendinitis    Arthritis    Asthma    Clotting disorder (HCC)    Diverticulitis    Diverticulosis    GERD (gastroesophageal reflux disease)    Heart murmur    as a child   History of blood clots    History of chicken pox    History of pulmonary embolus (PE) 2018   Hyperlipidemia    Hypertension    Liver cirrhosis secondary to NASH (nonalcoholic steatohepatitis) (HCC)    Osteoarthritis    Phlebitis    Plantar fasciitis    Sleep apnea    not wearing CPAP at this time   Vertigo     No Known Allergies   Review of Systems:  No headache, visual changes, nausea, vomiting, diarrhea, constipation, dizziness, abdominal pain, skin rash, fevers, chills, night sweats, weight loss, swollen lymph nodes, body aches, joint swelling, chest pain, shortness of breath, mood changes. POSITIVE muscle aches  Objective  There were no vitals taken for this visit.   General: No apparent distress alert and oriented x3 mood and affect normal, dressed appropriately.  HEENT: Pupils equal, extraocular movements intact  Respiratory:  Patient's speak in full sentences and does not appear short of breath  Cardiovascular: No lower extremity edema, non tender, no erythema  Gait MSK:  Back   Osteopathic findings  C2 flexed rotated and side bent right C6 flexed rotated and side bent left T3 extended rotated and side bent right inhaled rib T9 extended rotated and side bent left L2 flexed rotated and side bent right Sacrum right on right       Assessment and Plan:  No problem-specific Assessment & Plan notes found for this encounter.    Nonallopathic problems  Decision today to treat with OMT was based on Physical Exam  After verbal consent patient was treated with HVLA, ME, FPR techniques in cervical, rib, thoracic, lumbar, and sacral  areas  Patient tolerated the procedure well with improvement in symptoms  Patient given exercises, stretches and lifestyle modifications  See medications in patient instructions if given  Patient will follow up in 4-8 weeks     The above documentation has been reviewed and is accurate and complete Apryl Brymer M Prapti Grussing, DO         Note: This dictation was prepared with Dragon dictation along with smaller phrase technology. Any transcriptional errors that result from this process are unintentional.

## 2023-02-13 ENCOUNTER — Ambulatory Visit: Payer: Medicare Other | Admitting: Family Medicine

## 2023-03-03 DIAGNOSIS — R632 Polyphagia: Secondary | ICD-10-CM | POA: Diagnosis not present

## 2023-03-03 DIAGNOSIS — R7303 Prediabetes: Secondary | ICD-10-CM | POA: Diagnosis not present

## 2023-03-03 DIAGNOSIS — E782 Mixed hyperlipidemia: Secondary | ICD-10-CM | POA: Diagnosis not present

## 2023-03-03 DIAGNOSIS — K760 Fatty (change of) liver, not elsewhere classified: Secondary | ICD-10-CM | POA: Diagnosis not present

## 2023-03-03 DIAGNOSIS — I10 Essential (primary) hypertension: Secondary | ICD-10-CM | POA: Diagnosis not present

## 2023-03-13 NOTE — Progress Notes (Signed)
 [facility] [address] [place] [address] Phone: [phone] Subjective:   [name] am a scribe for Dr. Felipe Horton.    I'm seeing this patient by the request  of:  Meldon Sport, MD  CC: Neck and back pain follow-up  [id number]  Isabel Garcia is a 68 y.o. female coming in with complaint of back and neck pain. OMT on 12/02/2022. Patient states been driving. It is tight and not sleeping in her own bed. Tried to do some exercises so she wouldn't be stiff, but wasn't able to do them consistently due to recent traveling.           Reviewed prior external information including notes and imaging from previsou exam, outside providers and external EMR if available.   As well as notes that were available from care everywhere and other healthcare systems.  Past medical history, social, surgical and family history all reviewed in electronic medical record.  No pertanent information unless stated regarding to the chief complaint.   Past Medical History:  Diagnosis Date   Achilles tendinitis    Arthritis    Asthma    Clotting disorder (HCC)    Diverticulitis    Diverticulosis    GERD (gastroesophageal reflux disease)    Heart murmur    as a child   History of blood clots    History of chicken pox    History of pulmonary embolus (PE) 2018   Hyperlipidemia    Hypertension    Liver cirrhosis secondary to NASH (nonalcoholic steatohepatitis) (HCC)    Osteoarthritis    Phlebitis    Plantar fasciitis    Sleep apnea    not wearing CPAP at this time   Vertigo     No Known Allergies   Review of Systems:  No headache, visual changes, nausea, vomiting, diarrhea, constipation, dizziness, abdominal pain, skin rash, fevers, chills, night sweats, weight loss, swollen lymph nodes, body aches, joint swelling, chest pain, shortness of breath, mood changes. POSITIVE muscle aches  Objective  Blood pressure 124/70, pulse 81, height 5\' 6"  (1.676  m), SpO2 99%.   General: No apparent distress alert and oriented x3 mood and affect normal, dressed appropriately.  HEENT: Pupils equal, extraocular movements intact  Respiratory: Patient's speak in full sentences and does not appear short of breath  Cardiovascular: No lower extremity edema, non tender, no erythema  Gait MSK:  Back does have some loss lordosis noted.  Some increasing in kyphosis of the upper back of the thoracic spine.  Patient does have a negative straight leg test noted.  Does have some difficulty with extension though also noted.  Osteopathic findings  C5 flexed rotated and side bent left T5 extended rotated and side bent right inhaled rib L2 flexed rotated and side bent right Sacrum right on right       Assessment and Plan:  Chronic low back pain with left-sided sciatica Has been doing relatively well.  Discussed icing regimen of home exercises, discussed which activities to do and which ones to avoid.  Discussed increasing activity slowly.  Increase activity and as tolerated as well.  Will follow-up again in 6 to 8 weeks.    Nonallopathic problems  Decision today to treat with OMT was based on Physical Exam  After verbal consent patient was treated with HVLA, ME, FPR techniques in cervical, rib, thoracic, lumbar, and sacral  areas  Patient tolerated the procedure well with improvement in symptoms  Patient given  exercises, stretches and lifestyle modifications  See medications in patient instructions if given  Patient will follow up in 4-8 weeks      The above documentation has been reviewed and is accurate and complete Isabel Shadrick M Ender Rorke, DO        Note: This dictation was prepared with Dragon dictation along with smaller phrase technology. Any transcriptional errors that result from this process are unintentional.

## 2023-03-16 ENCOUNTER — Ambulatory Visit: Payer: Medicare (Managed Care) | Admitting: Family Medicine

## 2023-03-16 ENCOUNTER — Encounter: Payer: Self-pay | Admitting: Family Medicine

## 2023-03-16 VITALS — BP 124/70 | HR 81 | Ht 66.0 in

## 2023-03-16 DIAGNOSIS — M9902 Segmental and somatic dysfunction of thoracic region: Secondary | ICD-10-CM

## 2023-03-16 DIAGNOSIS — M9904 Segmental and somatic dysfunction of sacral region: Secondary | ICD-10-CM | POA: Diagnosis not present

## 2023-03-16 DIAGNOSIS — G8929 Other chronic pain: Secondary | ICD-10-CM | POA: Diagnosis not present

## 2023-03-16 DIAGNOSIS — M9903 Segmental and somatic dysfunction of lumbar region: Secondary | ICD-10-CM | POA: Diagnosis not present

## 2023-03-16 DIAGNOSIS — M9908 Segmental and somatic dysfunction of rib cage: Secondary | ICD-10-CM | POA: Diagnosis not present

## 2023-03-16 DIAGNOSIS — M9901 Segmental and somatic dysfunction of cervical region: Secondary | ICD-10-CM | POA: Diagnosis not present

## 2023-03-16 DIAGNOSIS — M5442 Lumbago with sciatica, left side: Secondary | ICD-10-CM | POA: Diagnosis not present

## 2023-03-16 NOTE — Patient Instructions (Addendum)
 Good to see you. Keen Merrel with [employer]rium sole. See me in 2 months.

## 2023-03-16 NOTE — Assessment & Plan Note (Signed)
 Has been doing relatively well.  Discussed icing regimen of home exercises, discussed which activities to do and which ones to avoid.  Discussed increasing activity slowly.  Increase activity and as tolerated as well.  Will follow-up again in 6 to 8 weeks.

## 2023-05-04 NOTE — Progress Notes (Deleted)
  [name] [address] [place] [address] Phone: [phone] Subjective:    I'm seeing this patient by the request  of:  Anabel Halon, MD  CC:   [id number]  Isabel Garcia is a 68 y.o. female coming in with complaint of back and neck pain. OMT on 03/16/2023. Patient states   Medications patient has been prescribed:   Taking:         Reviewed prior external information including notes and imaging from previsou exam, outside providers and external EMR if available.   As well as notes that were available from care everywhere and other healthcare systems.  Past medical history, social, surgical and family history all reviewed in electronic medical record.  No pertanent information unless stated regarding to the chief complaint.   Past Medical History:  Diagnosis Date   Achilles tendinitis    Arthritis    Asthma    Clotting disorder (HCC)    Diverticulitis    Diverticulosis    GERD (gastroesophageal reflux disease)    Heart murmur    as a child   History of blood clots    History of chicken pox    History of pulmonary embolus (PE) 2018   Hyperlipidemia    Hypertension    Liver cirrhosis secondary to NASH (nonalcoholic steatohepatitis) (HCC)    Osteoarthritis    Phlebitis    Plantar fasciitis    Sleep apnea    not wearing CPAP at this time   Vertigo     No Known Allergies   Review of Systems:  No headache, visual changes, nausea, vomiting, diarrhea, constipation, dizziness, abdominal pain, skin rash, fevers, chills, night sweats, weight loss, swollen lymph nodes, body aches, joint swelling, chest pain, shortness of breath, mood changes. POSITIVE muscle aches  Objective  There were no vitals taken for this visit.   General: No apparent distress alert and oriented x3 mood and affect normal, dressed appropriately.  HEENT: Pupils equal, extraocular movements intact  Respiratory: Patient's speak in full sentences and  does not appear short of breath  Cardiovascular: No lower extremity edema, non tender, no erythema  Gait MSK:  Back   Osteopathic findings  C2 flexed rotated and side bent right C6 flexed rotated and side bent left T3 extended rotated and side bent right inhaled rib T9 extended rotated and side bent left L2 flexed rotated and side bent right Sacrum right on right       Assessment and Plan:  No problem-specific Assessment & Plan notes found for this encounter.    Nonallopathic problems  Decision today to treat with OMT was based on Physical Exam  After verbal consent patient was treated with HVLA, ME, FPR techniques in cervical, rib, thoracic, lumbar, and sacral  areas  Patient tolerated the procedure well with improvement in symptoms  Patient given exercises, stretches and lifestyle modifications  See medications in patient instructions if given  Patient will follow up in 4-8 weeks             Note: This dictation was prepared with Dragon dictation along with smaller phrase technology. Any transcriptional errors that result from this process are unintentional.

## 2023-05-07 DIAGNOSIS — E782 Mixed hyperlipidemia: Secondary | ICD-10-CM | POA: Diagnosis not present

## 2023-05-07 DIAGNOSIS — I10 Essential (primary) hypertension: Secondary | ICD-10-CM | POA: Diagnosis not present

## 2023-05-07 DIAGNOSIS — R632 Polyphagia: Secondary | ICD-10-CM | POA: Diagnosis not present

## 2023-05-07 DIAGNOSIS — R7303 Prediabetes: Secondary | ICD-10-CM | POA: Diagnosis not present

## 2023-05-07 DIAGNOSIS — K760 Fatty (change of) liver, not elsewhere classified: Secondary | ICD-10-CM | POA: Diagnosis not present

## 2023-05-07 DIAGNOSIS — E66812 Obesity, class 2: Secondary | ICD-10-CM | POA: Diagnosis not present

## 2023-05-07 DIAGNOSIS — Z6835 Body mass index (BMI) 35.0-35.9, adult: Secondary | ICD-10-CM | POA: Diagnosis not present

## 2023-05-11 ENCOUNTER — Ambulatory Visit: Payer: Medicare (Managed Care) | Admitting: Family Medicine

## 2023-05-14 ENCOUNTER — Encounter: Payer: Self-pay | Admitting: Internal Medicine

## 2023-05-14 ENCOUNTER — Ambulatory Visit (INDEPENDENT_AMBULATORY_CARE_PROVIDER_SITE_OTHER): Payer: Medicare (Managed Care) | Admitting: Internal Medicine

## 2023-05-14 VITALS — BP 139/85 | HR 80 | Ht 66.0 in | Wt 221.2 lb

## 2023-05-14 DIAGNOSIS — Z1231 Encounter for screening mammogram for malignant neoplasm of breast: Secondary | ICD-10-CM | POA: Diagnosis not present

## 2023-05-14 DIAGNOSIS — J302 Other seasonal allergic rhinitis: Secondary | ICD-10-CM | POA: Diagnosis not present

## 2023-05-14 DIAGNOSIS — E782 Mixed hyperlipidemia: Secondary | ICD-10-CM | POA: Diagnosis not present

## 2023-05-14 DIAGNOSIS — G4733 Obstructive sleep apnea (adult) (pediatric): Secondary | ICD-10-CM | POA: Diagnosis not present

## 2023-05-14 DIAGNOSIS — I10 Essential (primary) hypertension: Secondary | ICD-10-CM

## 2023-05-14 DIAGNOSIS — R7303 Prediabetes: Secondary | ICD-10-CM

## 2023-05-14 DIAGNOSIS — J4520 Mild intermittent asthma, uncomplicated: Secondary | ICD-10-CM | POA: Diagnosis not present

## 2023-05-14 DIAGNOSIS — Z86711 Personal history of pulmonary embolism: Secondary | ICD-10-CM

## 2023-05-14 MED ORDER — BENZONATATE 100 MG PO CAPS: 100.0000 mg | ORAL_CAPSULE | Freq: Two times a day (BID) | ORAL | 1 refills | Status: DC | PRN

## 2023-05-14 MED ORDER — CETIRIZINE HCL 10 MG PO TABS: 10.0000 mg | ORAL_TABLET | Freq: Every day | ORAL | 5 refills | Status: AC

## 2023-05-14 MED ORDER — AMLODIPINE BESYLATE 10 MG PO TABS: 10.0000 mg | ORAL_TABLET | Freq: Every day | ORAL | 5 refills | Status: AC

## 2023-05-14 MED ORDER — APIXABAN 5 MG PO TABS: 5.0000 mg | ORAL_TABLET | Freq: Two times a day (BID) | ORAL | 5 refills | Status: AC

## 2023-05-14 NOTE — Progress Notes (Signed)
 Established Patient Office Visit  Subjective:  Patient ID: Isabel Garcia, female    DOB: 06/27/1955  Age: 68 y.o. MRN: [id number]  CC:  Chief Complaint  Patient presents with   Care Management    6 month f/u, reports cough ongoing for about 1 month or longer.     HPI [name] is a 68 y.o. female with past medical history of HTN, OSA, GERD, PE/DVT and obesity who presents for f/u of her chronic medical conditions.  HTN: BP is well-controlled. Takes medications regularly. Patient denies headache, dizziness, dyspnea or palpitations.   Asthma: She has chronic cough, and recently worse for the last 1 month.  She uses albuterol inhaler as needed for dyspnea or wheezing.  Denies any recent fever or chills.  She has been evaluated by cardiology for chest pain, but was deemed to be likely musculoskeletal in etiology. Her chest pain episodes have resolved now.  She takes omeprazole as needed for GERD, but has not required it recently.  She denies any dysphagia or odynophagia currently.   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  She is supposed to be on indefinite AC, and is on Eliquis.  She sees Dr. Antoine Primas for chronic low back pain.  She takes Mobic for it.  She also sees Dr. Jeanice Lim for weight management.     Past Medical History:  Diagnosis Date   Achilles tendinitis    Arthritis    Asthma    Clotting disorder (HCC)    Diverticulitis    Diverticulosis    GERD (gastroesophageal reflux disease)    Heart murmur    as a child   History of blood clots    History of chicken pox    History of pulmonary embolus (PE) 2018   Hyperlipidemia    Hypertension    Liver cirrhosis secondary to NASH (nonalcoholic steatohepatitis) (HCC)    Osteoarthritis    Phlebitis    Plantar fasciitis    Sleep apnea    not wearing CPAP at this time   Vertigo     Past Surgical History:  Procedure Laterality Date   ABDOMINAL HYSTERECTOMY      bone spur removal     COLONOSCOPY     KNEE ARTHROSCOPY Right    KNEE ARTHROSCOPY Right    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   Procedure: TOTAL KNEE ARTHROPLASTY;  Surgeon: Durene Romans, MD;  Location: [facility];  Service: Orthopedics;  Laterality: Right;  70 mins    Family History  Problem Relation Age of Onset   Arthritis Mother    Hypertension Mother    Hypertension Father    Heart attack Father    Early death Father    Asthma Sister    Heart attack Brother    Early death Brother    Hypertension Brother    Stroke Maternal Grandmother    Heart attack Maternal Grandfather    Heart attack Paternal Grandfather    Early death Paternal Grandfather    Colon cancer Neg Hx    Stomach cancer Neg Hx    Pancreatic cancer Neg Hx    Esophageal cancer Neg Hx    Rectal cancer Neg Hx     Social History   Socioeconomic History   Marital status: Divorced    Spouse name: Not on file   Number of children: 1   Years of education: Not on file   Highest education level: Not on  file  Occupational History   Occupation: Disabled  Tobacco Use   Smoking status: Never   Smokeless tobacco: Never  Vaping Use   Vaping status: Never Used  Substance and Sexual Activity   Alcohol use: Not Currently   Drug use: No   Sexual activity: Not Currently  Other Topics Concern   Not on file  Social History Narrative   Not on file   Social Drivers of Health   Financial Resource Strain: Low Risk  (06/09/2022)   Overall Financial Resource Strain (CARDIA)    Difficulty of Paying Living Expenses: Not hard at all  Food Insecurity: No Food Insecurity (06/09/2022)   Hunger Vital Sign    Worried About Running Out of Food in the Last Year: Never true    Ran Out of Food in the Last Year: Never true  Transportation Needs: No Transportation Needs (06/09/2022)   PRAPARE - Administrator, Civil Service (Medical): No    Lack of Transportation (Non-Medical): No  Physical Activity: Sufficiently Active  (06/09/2022)   Exercise Vital Sign    Days of Exercise per Week: 5 days    Minutes of Exercise per Session: 60 min  Stress: No Stress Concern Present (06/09/2022)   [employer] of Occupational Health - Occupational Stress Questionnaire    Feeling of Stress : Not at all  Social Connections: Moderately Integrated (06/09/2022)   Social Connection and Isolation Panel [NHANES]    Frequency of Communication with Friends and Family: More than three times a week    Frequency of Social Gatherings with Friends and Family: More than three times a week    Attends Religious Services: More than 4 times per year    Active Member of [employer] or Organizations: Yes    Attends Engineer, structural: More than 4 times per year    Marital Status: Divorced  Intimate Partner Violence: Not At Risk (06/09/2022)   Humiliation, Afraid, Rape, and Kick questionnaire    Fear of Current or Ex-Partner: No    Emotionally Abused: No    Physically Abused: No    Sexually Abused: No    Outpatient Medications Prior to Visit  Medication Sig Dispense Refill   albuterol (VENTOLIN HFA) 108 (90 Base) MCG/ACT inhaler Inhale 2 puffs into the lungs every 6 (six) hours as needed for wheezing or shortness of breath.      COD LIVER OIL PO Take 1 capsule by mouth daily.     cyclobenzaprine (FLEXERIL) 5 MG tablet Take 1 tablet (5 mg total) by mouth 2 (two) times daily as needed for muscle spasms. 30 tablet 1   fluticasone (FLONASE) 50 MCG/ACT nasal spray Place 2 sprays into both nostrils daily. 16 g 11   fluticasone-salmeterol (ADVAIR DISKUS) 250-50 MCG/ACT AEPB Inhale 1 puff into the lungs in the morning and at bedtime. 60 each 5   Iron-Vitamin C (IRON 100/C) 100-250 MG TABS Take by mouth.     lidocaine (LIDODERM) 5 % Place 1 patch onto the skin daily. Remove & Discard patch within 12 hours or as directed by MD 30 patch 0   meloxicam (MOBIC) 15 MG tablet Take 1 tablet (15 mg total) by mouth daily. 30 tablet 0   Zinc Sulfate (ZINC  15 PO) Take 1 capsule by mouth daily.     amLODipine (NORVASC) 10 MG tablet Take 1 tablet (10 mg total) by mouth daily. 30 tablet 5   apixaban (ELIQUIS) 5 MG TABS tablet Take 1 tablet (5 mg total)  by mouth 2 (two) times daily. 60 tablet 5   benzonatate (TESSALON) 100 MG capsule Take 1 capsule (100 mg total) by mouth 2 (two) times daily as needed for cough. 30 capsule 1   No facility-administered medications prior to visit.    No Known Allergies  ROS Review of Systems  Constitutional:  Negative for chills and fever.  HENT:  Negative for congestion, sinus pressure, sinus pain and sore throat.   Eyes:  Negative for pain and discharge.  Respiratory:  Positive for cough. Negative for shortness of breath.   Cardiovascular:  Negative for chest pain and palpitations.  Gastrointestinal:  Negative for abdominal pain, diarrhea, nausea and vomiting.  Endocrine: Negative for polydipsia and polyuria.  Genitourinary:  Negative for dysuria and hematuria.  Musculoskeletal:  Negative for neck pain and neck stiffness.  Skin:  Negative for rash.  Neurological:  Negative for dizziness and weakness.  Psychiatric/Behavioral:  Negative for agitation and behavioral problems.       Objective:    Physical Exam Vitals reviewed.  Constitutional:      General: She is not in acute distress.    Appearance: She is not diaphoretic.  HENT:     Head: Normocephalic and atraumatic.     Nose: Nose normal.     Mouth/Throat:     Mouth: Mucous membranes are moist.  Eyes:     General: No scleral icterus.    Extraocular Movements: Extraocular movements intact.  Cardiovascular:     Rate and Rhythm: Normal rate and regular rhythm.     Heart sounds: Normal heart sounds. No murmur heard. Pulmonary:     Breath sounds: Normal breath sounds. No wheezing or rales.  Musculoskeletal:     Cervical back: Neck supple. No tenderness.     Right lower leg: No edema.     Left lower leg: No edema.  Skin:    General: Skin is  warm.     Findings: No rash.  Neurological:     General: No focal deficit present.     Mental Status: She is alert and oriented to person, place, and time.     Sensory: No sensory deficit.     Motor: No weakness.  Psychiatric:        Mood and Affect: Mood normal.        Behavior: Behavior normal.     BP 139/85   Pulse 80   Ht 5\' 6"  (1.676 m)   Wt 221 lb 3.2 oz (100.3 kg)   SpO2 97%   BMI 35.70 kg/m  Wt Readings from Last 3 Encounters:  05/14/23 221 lb 3.2 oz (100.3 kg)  01/06/23 217 lb 6.4 oz (98.6 kg)  12/12/22 219 lb (99.3 kg)    Lab Results  Component Value Date   TSH 1.280 04/11/2022   Lab Results  Component Value Date   WBC 4.3 06/04/2022   HGB 13.0 06/04/2022   HCT 38.0 06/04/2022   MCV 90.9 06/04/2022   PLT 273 06/04/2022   Lab Results  Component Value Date   NA 139 04/11/2022   K 3.9 04/11/2022   CO2 23 04/11/2022   GLUCOSE 95 04/11/2022   BUN 10 04/11/2022   CREATININE 0.80 04/11/2022   BILITOT 0.6 04/11/2022   ALKPHOS 91 04/11/2022   AST 36 04/11/2022   ALT 36 (H) 04/11/2022   PROT 7.5 04/11/2022   ALBUMIN 4.5 04/11/2022   CALCIUM 9.9 04/11/2022   ANIONGAP 9 06/08/2019   EGFR 88.0 08/19/2022   GFR  83.37 01/22/2021   Lab Results  Component Value Date   CHOL 198 04/11/2022   Lab Results  Component Value Date   HDL 57 04/11/2022   Lab Results  Component Value Date   LDLCALC 126 (H) 04/11/2022   Lab Results  Component Value Date   TRIG 80 04/11/2022   Lab Results  Component Value Date   CHOLHDL 3.5 04/11/2022   Lab Results  Component Value Date   HGBA1C 5.6 08/19/2022      Assessment & Plan:   Problem List Items Addressed This Visit       Cardiovascular and Mediastinum   Essential hypertension - Primary   BP Readings from Last 1 Encounters:  05/14/23 139/85   Well-controlled with amlodipine 10 mg QD Counseled for compliance with the medications Advised DASH diet and moderate exercise/walking, at least 150 mins/week       Relevant Medications   amLODipine (NORVASC) 10 MG tablet   apixaban (ELIQUIS) 5 MG TABS tablet   Other Relevant Orders   TSH   CMP14+EGFR   CBC with Differential/Platelet     Respiratory   Obstructive sleep apnea   Does not use CPAP regularly, advised to be compliant with CPAP use Was seen by Dr Tenny Craw recently      Asthma   Had added Advair as maintenance treatment, needs to be compliant Albuterol inhaler as needed for dyspnea or wheezing Tessalon PRN for cough      Relevant Medications   benzonatate (TESSALON) 100 MG capsule     Other   History of pulmonary embolus (PE)   Chart review suggests history of unprovoked PE/DVT in the past Supposed to be on indefinite AC, on Eliquis Had referred to heme-onc for further discussion      Relevant Medications   apixaban (ELIQUIS) 5 MG TABS tablet   Mixed hyperlipidemia   Advised to follow DASH diet      Relevant Medications   amLODipine (NORVASC) 10 MG tablet   apixaban (ELIQUIS) 5 MG TABS tablet   Other Relevant Orders   Lipid panel   Prediabetes   Lab Results  Component Value Date   HGBA1C 5.6 08/19/2022   Advised to follow DASH diet for HTN      Relevant Orders   Hemoglobin A1c   CMP14+EGFR   Urine Microalbumin w/creat. ratio   Seasonal allergies   Her chronic cough could be related to allergies as well Started Zyrtec 10 mg once daily Tessalon as needed for cough      Relevant Medications   cetirizine (ZYRTEC) 10 MG tablet   Other Visit Diagnoses       Encounter for screening mammogram for malignant neoplasm of breast       Relevant Orders   MM 3D SCREENING MAMMOGRAM BILATERAL BREAST        Meds ordered this encounter  Medications   amLODipine (NORVASC) 10 MG tablet    Sig: Take 1 tablet (10 mg total) by mouth daily.    Dispense:  30 tablet    Refill:  5   apixaban (ELIQUIS) 5 MG TABS tablet    Sig: Take 1 tablet (5 mg total) by mouth 2 (two) times daily.    Dispense:  60 tablet     Refill:  5   benzonatate (TESSALON) 100 MG capsule    Sig: Take 1 capsule (100 mg total) by mouth 2 (two) times daily as needed for cough.    Dispense:  30 capsule    Refill:  1   cetirizine (ZYRTEC) 10 MG tablet    Sig: Take 1 tablet (10 mg total) by mouth daily.    Dispense:  30 tablet    Refill:  5    Follow-up: Return in about 6 months (around 11/13/2023) for Annual physical (after 11/13/23).    Anabel Halon, MD

## 2023-05-14 NOTE — Assessment & Plan Note (Signed)
 Does not use CPAP regularly, advised to be compliant with CPAP use Was seen by Dr Tenny Craw recently

## 2023-05-14 NOTE — Assessment & Plan Note (Signed)
 Lab Results  Component Value Date   HGBA1C 5.6 08/19/2022   Advised to follow DASH diet for HTN

## 2023-05-14 NOTE — Assessment & Plan Note (Signed)
 Her chronic cough could be related to allergies as well Started Zyrtec 10 mg once daily Tessalon as needed for cough

## 2023-05-14 NOTE — Assessment & Plan Note (Addendum)
 Had added Advair as maintenance treatment, needs to be compliant Albuterol inhaler as needed for dyspnea or wheezing Tessalon PRN for cough

## 2023-05-14 NOTE — Assessment & Plan Note (Signed)
 BP Readings from Last 1 Encounters:  05/14/23 139/85   Well-controlled with amlodipine 10 mg QD Counseled for compliance with the medications Advised DASH diet and moderate exercise/walking, at least 150 mins/week

## 2023-05-14 NOTE — Assessment & Plan Note (Signed)
Advised to follow DASH diet

## 2023-05-14 NOTE — Patient Instructions (Addendum)
 Please schedule DEXA scan and Mammogram.  Please start taking Zyrtec for allergies.  Please take Benzonatate for cough.  Please perform warm water gargling. Okay to use humidifier to avoid dry air exposure.  Please continue to take medications as prescribed.  Please continue to follow low carb diet and perform moderate exercise/walking at least 150 mins/week.

## 2023-05-14 NOTE — Assessment & Plan Note (Signed)
Chart review suggests history of unprovoked PE/DVT in the past Supposed to be on indefinite AC, on Eliquis Had referred to heme-onc for further discussion

## 2023-05-15 LAB — CMP14+EGFR
ALT: 29 IU/L (ref 0–32)
AST: 28 IU/L (ref 0–40)
Albumin: 4.4 g/dL (ref 3.9–4.9)
Alkaline Phosphatase: 90 IU/L (ref 44–121)
BUN/Creatinine Ratio: 12 (ref 12–28)
BUN: 11 mg/dL (ref 8–27)
Bilirubin Total: 0.5 mg/dL (ref 0.0–1.2)
CO2: 22 mmol/L (ref 20–29)
Calcium: 9.9 mg/dL (ref 8.7–10.3)
Chloride: 102 mmol/L (ref 96–106)
Creatinine, Ser: 0.89 mg/dL (ref 0.57–1.00)
Globulin, Total: 3 g/dL (ref 1.5–4.5)
Glucose: 87 mg/dL (ref 70–99)
Potassium: 4 mmol/L (ref 3.5–5.2)
Sodium: 143 mmol/L (ref 134–144)
Total Protein: 7.4 g/dL (ref 6.0–8.5)
eGFR: 72 mL/min/{1.73_m2} (ref >59)

## 2023-05-15 LAB — CBC WITH DIFFERENTIAL/PLATELET
Basophils Absolute: 0 10*3/uL (ref 0.0–0.2)
Basos: 1 %
EOS (ABSOLUTE): 0 10*3/uL (ref 0.0–0.4)
Eos: 0 %
Hematocrit: 39.9 % (ref 34.0–46.6)
Hemoglobin: 13.4 g/dL (ref 11.1–15.9)
Immature Grans (Abs): 0 10*3/uL (ref 0.0–0.1)
Immature Granulocytes: 0 %
Lymphocytes Absolute: 2.9 10*3/uL (ref 0.7–3.1)
Lymphs: 51 %
MCH: 30.9 pg (ref 26.6–33.0)
MCHC: 33.6 g/dL (ref 31.5–35.7)
MCV: 92 fL (ref 79–97)
Monocytes Absolute: 0.4 10*3/uL (ref 0.1–0.9)
Monocytes: 7 %
Neutrophils Absolute: 2.3 10*3/uL (ref 1.4–7.0)
Neutrophils: 41 %
Platelets: 328 10*3/uL (ref 150–450)
RBC: 4.34 x10E6/uL (ref 3.77–5.28)
RDW: 13.6 % (ref 11.7–15.4)
WBC: 5.5 10*3/uL (ref 3.4–10.8)

## 2023-05-15 LAB — LIPID PANEL
Chol/HDL Ratio: 3.6 ratio (ref 0.0–4.4)
Cholesterol, Total: 196 mg/dL (ref 100–199)
HDL: 55 mg/dL (ref >39)
LDL Chol Calc (NIH): 123 mg/dL — ABNORMAL HIGH (ref 0–99)
Triglycerides: 99 mg/dL (ref 0–149)
VLDL Cholesterol Cal: 18 mg/dL (ref 5–40)

## 2023-05-15 LAB — MICROALBUMIN / CREATININE URINE RATIO
Creatinine, Urine: 249.2 mg/dL
Microalb/Creat Ratio: 4 mg/g{creat} (ref 0–29)
Microalbumin, Urine: 9.7 ug/mL

## 2023-05-15 LAB — HEMOGLOBIN A1C
Est. average glucose Bld gHb Est-mCnc: 120 mg/dL
Hgb A1c MFr Bld: 5.8 % — ABNORMAL HIGH (ref 4.8–5.6)

## 2023-05-15 LAB — TSH: TSH: 0.874 u[IU]/mL (ref 0.450–4.500)

## 2023-05-15 NOTE — Telephone Encounter (Unsigned)
 Copied from CRM [id number]. Topic: Clinical - Lab/Test Results >> May 15, 2023  9:00 AM [name] wrote: Reason for CRM:  Patient trying to reach Isabel Garcia back to speak about lab results

## 2023-06-04 ENCOUNTER — Inpatient Hospital Stay: Payer: Medicare (Managed Care) | Attending: Hematology

## 2023-06-04 DIAGNOSIS — Z86718 Personal history of other venous thrombosis and embolism: Secondary | ICD-10-CM | POA: Insufficient documentation

## 2023-06-04 DIAGNOSIS — Z7901 Long term (current) use of anticoagulants: Secondary | ICD-10-CM | POA: Insufficient documentation

## 2023-06-04 DIAGNOSIS — Z86711 Personal history of pulmonary embolism: Secondary | ICD-10-CM | POA: Insufficient documentation

## 2023-06-04 LAB — BASIC METABOLIC PANEL WITH GFR
Anion gap: 10 (ref 5–15)
BUN: 13 mg/dL (ref 8–23)
CO2: 23 mmol/L (ref 22–32)
Calcium: 9.7 mg/dL (ref 8.9–10.3)
Chloride: 102 mmol/L (ref 98–111)
Creatinine, Ser: 0.7 mg/dL (ref 0.44–1.00)
GFR, Estimated: >60 mL/min (ref >60)
Glucose, Bld: 112 mg/dL — ABNORMAL HIGH (ref 70–99)
Potassium: 3.6 mmol/L (ref 3.5–5.1)
Sodium: 135 mmol/L (ref 135–145)

## 2023-06-04 LAB — CBC WITH DIFFERENTIAL/PLATELET
Abs Immature Granulocytes: 0.01 10*3/uL (ref 0.00–0.07)
Basophils Absolute: 0 10*3/uL (ref 0.0–0.1)
Basophils Relative: 1 %
Eosinophils Absolute: 0.1 10*3/uL (ref 0.0–0.5)
Eosinophils Relative: 2 %
HCT: 40.6 % (ref 36.0–46.0)
Hemoglobin: 13.6 g/dL (ref 12.0–15.0)
Immature Granulocytes: 0 %
Lymphocytes Relative: 46 %
Lymphs Abs: 2.6 10*3/uL (ref 0.7–4.0)
MCH: 30.6 pg (ref 26.0–34.0)
MCHC: 33.5 g/dL (ref 30.0–36.0)
MCV: 91.4 fL (ref 80.0–100.0)
Monocytes Absolute: 0.4 10*3/uL (ref 0.1–1.0)
Monocytes Relative: 7 %
Neutro Abs: 2.4 10*3/uL (ref 1.7–7.7)
Neutrophils Relative %: 44 %
Platelets: 292 10*3/uL (ref 150–400)
RBC: 4.44 MIL/uL (ref 3.87–5.11)
RDW: 13.5 % (ref 11.5–15.5)
WBC: 5.5 10*3/uL (ref 4.0–10.5)
nRBC: 0 % (ref 0.0–0.2)

## 2023-06-04 LAB — D-DIMER, QUANTITATIVE: D-Dimer, Quant: <0.27 ug{FEU}/mL (ref 0.00–0.50)

## 2023-06-09 ENCOUNTER — Ambulatory Visit: Payer: Medicare Other | Admitting: Internal Medicine

## 2023-06-11 ENCOUNTER — Inpatient Hospital Stay: Payer: Medicare (Managed Care) | Admitting: Oncology

## 2023-06-15 NOTE — Telephone Encounter (Signed)
 Called patient about starting the sleep study Patient is under a new insurance plan  Patient will need to a new auth under the new insurance plan.

## 2023-06-15 NOTE — Telephone Encounter (Signed)
**Note De-Identified Julieana Eshleman Obfuscation** I called the pt and she verified that she has [employer]. Ordering provider: Dr Avanell Bob Associated diagnoses: Snoring-R06.43 and HTN: I10  WatchPAT PA obtained on 06/15/2023 by Maili Shutters, Isabella Mao, LPN. Authorization: Per Jenna at [employer], a PA is not required for CPT Code: 96045 [id number]).  Patient notified of PIN ([id number]) on 06/15/2023 Una Yeomans Notification Method: phone.  Phone note routed to covering staff for follow-up.

## 2023-06-16 DIAGNOSIS — E66812 Obesity, class 2: Secondary | ICD-10-CM | POA: Diagnosis not present

## 2023-06-16 DIAGNOSIS — Z6835 Body mass index (BMI) 35.0-35.9, adult: Secondary | ICD-10-CM | POA: Diagnosis not present

## 2023-06-16 DIAGNOSIS — I10 Essential (primary) hypertension: Secondary | ICD-10-CM | POA: Diagnosis not present

## 2023-06-16 DIAGNOSIS — E782 Mixed hyperlipidemia: Secondary | ICD-10-CM | POA: Diagnosis not present

## 2023-06-16 DIAGNOSIS — R7303 Prediabetes: Secondary | ICD-10-CM | POA: Diagnosis not present

## 2023-06-16 DIAGNOSIS — K760 Fatty (change of) liver, not elsewhere classified: Secondary | ICD-10-CM | POA: Diagnosis not present

## 2023-06-16 DIAGNOSIS — R632 Polyphagia: Secondary | ICD-10-CM | POA: Diagnosis not present

## 2023-06-16 NOTE — Progress Notes (Unsigned)
 [facility] [address], [place] [address]   CLINIC: Medical Oncology/Hematology  PCP:  Meldon Sport, MD [address] [place] [address] [phone]   REASON FOR VISIT:  Follow-up for unprovoked DVT and PE.   CURRENT THERAPY: Eliquis   INTERVAL HISTORY:   [name] 68 y.o. 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  She was last seen by Sheril Dines PA-C on 06/04/2022.  She reports that she is compliant with Eliquis .  She has scant blood in tissue when she blows her nose, but no major epistaxis.  No hematemesis, hematochezia, or melena.  She has not had any falls this year.   She has not had any other DVT or PE since her initial episode. She denies any unilateral leg swelling, pain, erythema, shortness of breath, dyspnea on exertion, chest pain, cough, hemoptysis, syncope, or palpitations; no current signs or symptoms of DVT or PE.  She has 100% energy and 100% appetite. She endorses that she is maintaining a stable weight.  ASSESSMENT & PLAN:  1.   Unprovoked DVT and PE: - She had right leg DVT and PE in January 2019 in [place], when she had syncopal episode on the street, resulting in admission to the hospital.  Denied any obvious provoking factor. - She reported multiple vein ablations done, last ablation about 1 year prior to DVT.  She also reports that she was wearing a tight cast (for Achilles tendinitis) a few months prior to DVT.  At that time, she was spending long hours on her feet. - 2D echo in June 2019 shows normal EF 65-70%. - Mammogram on 04/15/2019 is BI-RADS Category 1. - Does not have any B symptoms.   No personal history of malignancy.  No family history of malignancy. - Coagulopathy work-up (04/19/2019):  Lupus anticoagulant, anticardiolipin antibody, antibeta-2 glycoprotein 1 antibody, factor V Leiden and prothrombin gene mutations were negative. -  Bilateral lower extremity Doppler (04/27/2019) were also  negative for DVT. - Labs (06/04/2023): Normal CBC, BMP, and D-dimer - No current symptoms of DVT or PE. - She is taking Eliquis  5 mg twice daily.  She denies any major bleeding events.  No falls this year. - PLAN: Due to history of unprovoked symptomatic pulmonary embolism and low risk of bleeding at this time, we suggest indefinite anticoagulation. - We will continue to monitor risk-benefit ratio once a year in addition to checking annual D-dimer. - Labs and RTC in 1 year.  (Offered to discharge to PCP, but patient prefers annual hematology follow-up.)  PLAN SUMMARY: >> Labs in 1 year = BMP, CBC/D, Dimer >> OFFICE visit in 1 year    REVIEW OF SYSTEMS:   Review of Systems  Constitutional:  Negative for appetite change, chills, diaphoresis, fatigue (baseline, mild fatigue), fever and unexpected weight change.  HENT:   Negative for lump/mass and nosebleeds.   Eyes:  Negative for eye problems.  Respiratory:  Positive for cough (allergies with post-nasal drip). Negative for hemoptysis and shortness of breath.   Cardiovascular:  Negative for chest pain, leg swelling and palpitations.  Gastrointestinal:  Negative for abdominal pain, blood in stool, constipation, diarrhea, nausea and vomiting.  Genitourinary:  Negative for hematuria.   Musculoskeletal:  Negative for back pain.  Skin: Negative.   Neurological:  Positive for headaches (allergies). Negative for dizziness and light-headedness.  Hematological:  Does not bruise/bleed easily.  Psychiatric/Behavioral:  Negative for sleep disturbance.     PHYSICAL EXAM:  ECOG PERFORMANCE STATUS: 0 - Asymptomatic  Vitals:   06/17/23 1046 06/17/23 1051  BP: (!) 134/93 (!) 128/93  Pulse: 78   Resp: 18   Temp: 98.3 F (36.8 C)   SpO2: 100%    Filed Weights   06/17/23 1046  Weight: 220 lb 7.4 oz (100 kg)   Physical Exam Constitutional:      Appearance: Normal appearance. She is obese.  Cardiovascular:     Heart sounds: Normal heart sounds.   Pulmonary:     Breath sounds: Normal breath sounds.  Neurological:     General: No focal deficit present.     Mental Status: Mental status is at baseline.  Psychiatric:        Behavior: Behavior normal. Behavior is cooperative.    PAST MEDICAL/SURGICAL HISTORY:  Past Medical History:  Diagnosis Date   Achilles tendinitis    Arthritis    Asthma    Clotting disorder (HCC)    Diverticulitis    Diverticulosis    GERD (gastroesophageal reflux disease)    Heart murmur    as a child   History of blood clots    History of chicken pox    History of pulmonary embolus (PE) 2018   Hyperlipidemia    Hypertension    Liver cirrhosis secondary to NASH (nonalcoholic steatohepatitis) (HCC)    Osteoarthritis    Phlebitis    Plantar fasciitis    Sleep apnea    not wearing CPAP at this time   Vertigo    Past Surgical History:  Procedure Laterality Date   ABDOMINAL HYSTERECTOMY     bone spur removal     COLONOSCOPY     KNEE ARTHROSCOPY Right    KNEE ARTHROSCOPY Right    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   Procedure: TOTAL KNEE ARTHROPLASTY;  Surgeon: Claiborne Crew, MD;  Location: [facility];  Service: Orthopedics;  Laterality: Right;  70 mins    SOCIAL HISTORY:  Social History   Socioeconomic History   Marital status: Divorced    Spouse name: Not on file   Number of children: 1   Years of education: Not on file   Highest education level: Not on file  Occupational History   Occupation: Disabled  Tobacco Use   Smoking status: Never   Smokeless tobacco: Never  Vaping Use   Vaping status: Never Used  Substance and Sexual Activity   Alcohol use: Not Currently   Drug use: No   Sexual activity: Not Currently  Other Topics Concern   Not on file  Social History Narrative   Not on file   Social Drivers of Health   Financial Resource Strain: Low Risk  (06/09/2022)   Overall Financial Resource Strain (CARDIA)    Difficulty of Paying Living Expenses: Not hard at all  Food  Insecurity: No Food Insecurity (06/09/2022)   Hunger Vital Sign    Worried About Running Out of Food in the Last Year: Never true    Ran Out of Food in the Last Year: Never true  Transportation Needs: No Transportation Needs (06/09/2022)   PRAPARE - Administrator, Civil Service (Medical): No    Lack of Transportation (Non-Medical): No  Physical Activity: Sufficiently Active (06/09/2022)   Exercise Vital Sign    Days of Exercise per Week: 5 days    Minutes of Exercise per Session: 60 min  Stress: No Stress Concern Present (06/09/2022)   [employer] of Occupational Health - Occupational Stress Questionnaire  Feeling of Stress : Not at all  Social Connections: Moderately Integrated (06/09/2022)   Social Connection and Isolation Panel [NHANES]    Frequency of Communication with Friends and Family: More than three times a week    Frequency of Social Gatherings with Friends and Family: More than three times a week    Attends Religious Services: More than 4 times per year    Active Member of [employer] or Organizations: Yes    Attends Engineer, structural: More than 4 times per year    Marital Status: Divorced  Intimate Partner Violence: Not At Risk (06/09/2022)   Humiliation, Afraid, Rape, and Kick questionnaire    Fear of Current or Ex-Partner: No    Emotionally Abused: No    Physically Abused: No    Sexually Abused: No    FAMILY HISTORY:  Family History  Problem Relation Age of Onset   Arthritis Mother    Hypertension Mother    Hypertension Father    Heart attack Father    Early death Father    Asthma Sister    Heart attack Brother    Early death Brother    Hypertension Brother    Stroke Maternal Grandmother    Heart attack Maternal Grandfather    Heart attack Paternal Grandfather    Early death Paternal Grandfather    Colon cancer Neg Hx    Stomach cancer Neg Hx    Pancreatic cancer Neg Hx    Esophageal cancer Neg Hx    Rectal cancer Neg Hx     CURRENT  MEDICATIONS:  Outpatient Encounter Medications as of 06/17/2023  Medication Sig   albuterol  (VENTOLIN  HFA) 108 (90 Base) MCG/ACT inhaler Inhale 2 puffs into the lungs every 6 (six) hours as needed for wheezing or shortness of breath.    amLODipine  (NORVASC ) 10 MG tablet Take 1 tablet (10 mg total) by mouth daily.   apixaban  (ELIQUIS ) 5 MG TABS tablet Take 1 tablet (5 mg total) by mouth 2 (two) times daily.   benzonatate  (TESSALON ) 100 MG capsule Take 1 capsule (100 mg total) by mouth 2 (two) times daily as needed for cough.   cetirizine  (ZYRTEC ) 10 MG tablet Take 1 tablet (10 mg total) by mouth daily.   COD LIVER OIL PO Take 1 capsule by mouth daily.   cyclobenzaprine  (FLEXERIL ) 5 MG tablet Take 1 tablet (5 mg total) by mouth 2 (two) times daily as needed for muscle spasms.   fluticasone  (FLONASE ) 50 MCG/ACT nasal spray Place 2 sprays into both nostrils daily.   fluticasone -salmeterol (ADVAIR DISKUS) 250-50 MCG/ACT AEPB Inhale 1 puff into the lungs in the morning and at bedtime.   Iron-Vitamin C (IRON 100/C) 100-250 MG TABS Take by mouth.   lidocaine  (LIDODERM ) 5 % Place 1 patch onto the skin daily. Remove & Discard patch within 12 hours or as directed by MD   meloxicam  (MOBIC ) 15 MG tablet Take 1 tablet (15 mg total) by mouth daily.   TOPAMAX 50 MG tablet 1 tablet Orally Once a day for 30 days   Zinc Sulfate (ZINC 15 PO) Take 1 capsule by mouth daily.   No facility-administered encounter medications on file as of 06/17/2023.    ALLERGIES:  No Known Allergies  LABORATORY DATA:  I have reviewed the labs as listed.  CBC    Component Value Date/Time   WBC 5.5 06/04/2023 1401   RBC 4.44 06/04/2023 1401   HGB 13.6 06/04/2023 1401   HGB 13.4 05/14/2023 1551  HCT 40.6 06/04/2023 1401   HCT 39.9 05/14/2023 1551   PLT 292 06/04/2023 1401   PLT 328 05/14/2023 1551   MCV 91.4 06/04/2023 1401   MCV 92 05/14/2023 1551   MCH 30.6 06/04/2023 1401   MCHC 33.5 06/04/2023 1401   RDW 13.5  06/04/2023 1401   RDW 13.6 05/14/2023 1551   LYMPHSABS 2.6 06/04/2023 1401   LYMPHSABS 2.9 05/14/2023 1551   MONOABS 0.4 06/04/2023 1401   EOSABS 0.1 06/04/2023 1401   EOSABS 0.0 05/14/2023 1551   BASOSABS 0.0 06/04/2023 1401   BASOSABS 0.0 05/14/2023 1551      Latest Ref Rng & Units 06/04/2023    2:01 PM 05/14/2023    3:51 PM 04/11/2022    9:12 AM  CMP  Glucose 70 - 99 mg/dL 161  87  95   BUN 8 - 23 mg/dL 13  11  10    Creatinine 0.44 - 1.00 mg/dL 0.96  0.45  4.09   Sodium 135 - 145 mmol/L 135  143  139   Potassium 3.5 - 5.1 mmol/L 3.6  4.0  3.9   Chloride 98 - 111 mmol/L 102  102  101   CO2 22 - 32 mmol/L 23  22  23    Calcium  8.9 - 10.3 mg/dL 9.7  9.9  9.9   Total Protein 6.0 - 8.5 g/dL  7.4  7.5   Total Bilirubin 0.0 - 1.2 mg/dL  0.5  0.6   Alkaline Phos 44 - 121 IU/L  90  91   AST 0 - 40 IU/L  28  36   ALT 0 - 32 IU/L  29  36     DIAGNOSTIC IMAGING:  I have independently reviewed the relevant imaging and discussed with the patient.   WRAP UP:  All questions were answered. The patient knows to call the clinic with any problems, questions or concerns.  Medical decision making: Low  Time spent on visit: I spent 15 minutes counseling the patient face to face. The total time spent in the appointment was 22 minutes and more than 50% was on counseling.  Sonnie Dusky, PA-C  06/17/23 12:55 PM

## 2023-06-17 ENCOUNTER — Inpatient Hospital Stay: Payer: Medicare (Managed Care) | Admitting: Physician Assistant

## 2023-06-17 DIAGNOSIS — Z7901 Long term (current) use of anticoagulants: Secondary | ICD-10-CM | POA: Diagnosis not present

## 2023-06-17 DIAGNOSIS — Z86711 Personal history of pulmonary embolism: Secondary | ICD-10-CM

## 2023-06-17 DIAGNOSIS — Z86718 Personal history of other venous thrombosis and embolism: Secondary | ICD-10-CM | POA: Diagnosis not present

## 2023-06-17 NOTE — Patient Instructions (Signed)
[facility] ?Discharge Instructions ? ?You were seen today by Katrianna Friesenhahn PA-C for your HISTORY OF BLOOD CLOTS.  Since your first blood clot did not have any obvious cause, you are at high risk of recurrent blood clots.  Therefore, I recommend that you continue Eliquis at this time.  We will see you once a year for ongoing evaluation of the risks versus benefits of blood thinners.  ? ? ?Thank you for choosing [facility] to provide your oncology and hematology care.  To afford each patient quality time with our provider, please arrive at least 15 minutes before your scheduled appointment time.  ? ?If you have a lab appointment with the Cancer Center please come in thru the Main Entrance and check in at the main information desk. ? ?You need to re-schedule your appointment should you arrive 10 or more minutes late.  We strive to give you quality time with our providers, and arriving late affects you and other patients whose appointments are after yours.  Also, if you no show three or more times for appointments you may be dismissed from the clinic at the providers discretion.     ?Again, thank you for choosing [facility].  Our hope is that these requests will decrease the amount of time that you wait before being seen by our physicians.       ?_____________________________________________________________ ? ?Should you have questions after your visit to [facility], please contact our office at [phone] and follow the prompts.  Our office hours are 8:00 a.m. and 4:30 p.m. Monday - Friday.  Please note that voicemails left after 4:00 p.m. may not be returned until the following business day.  We are closed weekends and major holidays.  You do have access to a nurse 24-7, just call the main number to the clinic [phone] and do not press any options, hold on the line and a nurse will answer the phone.    ? ?For prescription refill requests, have your pharmacy contact our office and allow 72 hours.   ? ?Due to Covid, you will need to wear a mask upon entering the hospital. If you do not have a mask, a mask will be given to you at the Main Entrance upon arrival. For doctor visits, patients may have 1 support person age 18 or older with them. For treatment visits, patients can not have anyone with them due to social distancing guidelines and our immunocompromised population.  ? ? ? ?

## 2023-06-19 NOTE — Progress Notes (Signed)
 [facility] [address] [place] [address] Phone: [phone] Subjective:   Isabel Garcia, am serving as a scribe for Dr. Ronnell Coins.  I'm seeing this patient by the request  of:  Meldon Sport, MD  CC: Back and neck pain follow-up  [id number]  Isabel Garcia is a 68 y.o. female coming in with complaint of back and neck pain. OMT on 03/16/2023. Patient states that she has been doing a lot of walking and standing. Upper back has been stiff. Using orthotics which are helpful.   Medications patient has been prescribed:   Taking:         Reviewed prior external information including notes and imaging from previsou exam, outside providers and external EMR if available.   As well as notes that were available from care everywhere and other healthcare systems.  Past medical history, social, surgical and family history all reviewed in electronic medical record.  No pertanent information unless stated regarding to the chief complaint.   Past Medical History:  Diagnosis Date   Achilles tendinitis    Arthritis    Asthma    Clotting disorder (HCC)    Diverticulitis    Diverticulosis    GERD (gastroesophageal reflux disease)    Heart murmur    as a child   History of blood clots    History of chicken pox    History of pulmonary embolus (PE) 2018   Hyperlipidemia    Hypertension    Liver cirrhosis secondary to NASH (nonalcoholic steatohepatitis) (HCC)    Osteoarthritis    Phlebitis    Plantar fasciitis    Sleep apnea    not wearing CPAP at this time   Vertigo     No Known Allergies   Review of Systems:  No headache, visual changes, nausea, vomiting, diarrhea, constipation, dizziness, abdominal pain, skin rash, fevers, chills, night sweats, weight loss, swollen lymph nodes, body aches, joint swelling, chest pain, shortness of breath, mood changes. POSITIVE muscle aches  Objective  Blood pressure 116/84, pulse 79, height 5\' 6"   (1.676 m), weight 222 lb (100.7 kg), SpO2 98%.   General: No apparent distress alert and oriented x3 mood and affect normal, dressed appropriately.  HEENT: Pupils equal, extraocular movements intact  Respiratory: Patient's speak in full sentences and does not appear short of breath  Cardiovascular: No lower extremity edema, non tender, no erythema  Gait MSK:  Back severe tenderness noted in the sacroiliac joints bilaterally.  Seems to be greater right greater than left.  Significant difficulty with even going on with the left sided Veldon German though.  Worsening pain with extension of the back.  Negative straight leg test noted today.  Osteopathic findings  C4 extended rotated and side bent left T4 extended rotated and side bent right inhaled rib L2 flexed rotated and side bent right Sacrum left on left       Assessment and Plan:  Chronic low back pain with left-sided sciatica Left-sided continues to give her some difficulty.  Likely some underlying arthritic changes but are still contributing but likely no radicular symptoms.  Continue to work on core strengthening, home exercises, has some limited improvement in weight and the core strengthening.  No other significant changes in medication.  Follow-up again in 6 to 8 weeks    Nonallopathic problems  Decision today to treat with OMT was based on Physical Exam  After verbal consent patient was treated with HVLA, ME, FPR techniques in cervical,  rib, thoracic, lumbar, and sacral  areas  Patient tolerated the procedure well with improvement in symptoms  Patient given exercises, stretches and lifestyle modifications  See medications in patient instructions if given  Patient will follow up in 6-8 weeks    The above documentation has been reviewed and is accurate and complete Isidro Margo, DO          Note: This dictation was prepared with Dragon dictation along with smaller phrase technology. Any transcriptional errors that  result from this process are unintentional.

## 2023-06-23 ENCOUNTER — Ambulatory Visit: Payer: Medicare (Managed Care) | Admitting: Family Medicine

## 2023-06-23 VITALS — BP 116/84 | HR 79 | Ht 66.0 in | Wt 222.0 lb

## 2023-06-23 DIAGNOSIS — G8929 Other chronic pain: Secondary | ICD-10-CM | POA: Diagnosis not present

## 2023-06-23 DIAGNOSIS — M9904 Segmental and somatic dysfunction of sacral region: Secondary | ICD-10-CM

## 2023-06-23 DIAGNOSIS — M9901 Segmental and somatic dysfunction of cervical region: Secondary | ICD-10-CM

## 2023-06-23 DIAGNOSIS — M9908 Segmental and somatic dysfunction of rib cage: Secondary | ICD-10-CM

## 2023-06-23 DIAGNOSIS — M9902 Segmental and somatic dysfunction of thoracic region: Secondary | ICD-10-CM

## 2023-06-23 DIAGNOSIS — M9903 Segmental and somatic dysfunction of lumbar region: Secondary | ICD-10-CM

## 2023-06-23 DIAGNOSIS — M5442 Lumbago with sciatica, left side: Secondary | ICD-10-CM

## 2023-06-23 NOTE — Patient Instructions (Signed)
 Scapular exercises You and Isabel Garcia need a vacation See me in 6-8 weeks

## 2023-06-25 ENCOUNTER — Encounter (HOSPITAL_COMMUNITY): Payer: Self-pay

## 2023-06-25 ENCOUNTER — Encounter: Payer: Self-pay | Admitting: Family Medicine

## 2023-06-25 ENCOUNTER — Ambulatory Visit (HOSPITAL_COMMUNITY)
Admission: RE | Admit: 2023-06-25 | Discharge: 2023-06-25 | Disposition: A | Discharge status: Home / Self Care | Payer: Medicare (Managed Care) | Source: Ambulatory Visit | Attending: Internal Medicine | Admitting: Internal Medicine

## 2023-06-25 DIAGNOSIS — Z780 Asymptomatic menopausal state: Secondary | ICD-10-CM | POA: Diagnosis not present

## 2023-06-25 DIAGNOSIS — Z1231 Encounter for screening mammogram for malignant neoplasm of breast: Secondary | ICD-10-CM | POA: Insufficient documentation

## 2023-06-25 DIAGNOSIS — Z13820 Encounter for screening for osteoporosis: Secondary | ICD-10-CM

## 2023-06-25 DIAGNOSIS — M8589 Other specified disorders of bone density and structure, multiple sites: Secondary | ICD-10-CM | POA: Insufficient documentation

## 2023-06-25 NOTE — Assessment & Plan Note (Signed)
 Left-sided continues to give her some difficulty.  Likely some underlying arthritic changes but are still contributing but likely no radicular symptoms.  Continue to work on core strengthening, home exercises, has some limited improvement in weight and the core strengthening.  No other significant changes in medication.  Follow-up again in 6 to 8 weeks

## 2023-06-26 ENCOUNTER — Ambulatory Visit: Payer: Self-pay | Admitting: Internal Medicine

## 2023-07-09 ENCOUNTER — Telehealth: Payer: Self-pay

## 2023-07-10 NOTE — Telephone Encounter (Signed)
**Note De-Identified Joselynn Amoroso Obfuscation** I called the pt but got no answer so I left a message on her VM (OK per DPR) advising her that a PA is not required per [employer] for her home sleep test and that it is ok to proceed with her home sleep test. I also left the WatchPat One-HST device Pin # of "[phone]" in the message.  I did leave my name and the office phone number so she can call back if she has any questions or concerns.

## 2023-07-14 DIAGNOSIS — R632 Polyphagia: Secondary | ICD-10-CM | POA: Diagnosis not present

## 2023-07-14 DIAGNOSIS — K760 Fatty (change of) liver, not elsewhere classified: Secondary | ICD-10-CM | POA: Diagnosis not present

## 2023-07-14 DIAGNOSIS — R7303 Prediabetes: Secondary | ICD-10-CM | POA: Diagnosis not present

## 2023-07-14 DIAGNOSIS — E66812 Obesity, class 2: Secondary | ICD-10-CM | POA: Diagnosis not present

## 2023-07-14 DIAGNOSIS — M8580 Other specified disorders of bone density and structure, unspecified site: Secondary | ICD-10-CM | POA: Diagnosis not present

## 2023-07-14 DIAGNOSIS — Z6835 Body mass index (BMI) 35.0-35.9, adult: Secondary | ICD-10-CM | POA: Diagnosis not present

## 2023-07-14 DIAGNOSIS — E782 Mixed hyperlipidemia: Secondary | ICD-10-CM | POA: Diagnosis not present

## 2023-07-14 DIAGNOSIS — I10 Essential (primary) hypertension: Secondary | ICD-10-CM | POA: Diagnosis not present

## 2023-07-21 ENCOUNTER — Ambulatory Visit: Payer: Medicare (Managed Care)

## 2023-07-28 ENCOUNTER — Ambulatory Visit (INDEPENDENT_AMBULATORY_CARE_PROVIDER_SITE_OTHER): Payer: Medicare (Managed Care)

## 2023-07-28 DIAGNOSIS — R7303 Prediabetes: Secondary | ICD-10-CM

## 2023-07-28 LAB — HM DIABETES EYE EXAM

## 2023-07-28 NOTE — Progress Notes (Unsigned)
 [facility] [address] [place] [address] Phone: [phone] Subjective:   Isabel Garcia, am serving as a scribe for Dr. Arthea Garcia.  I'm seeing this patient by the request  of:  Isabel Suzzane POUR, MD  CC: Low back pain, acute left leg pain  [id number]  Isabel Garcia is a 68 y.o. female coming in with complaint of back and neck pain. OMT on 06/23/2023. Patient states same per usual. R hip/leg still bothering her.  States that it hurts her on the right side of the hip but the left side of the posterior leg.  Medications patient has been prescribed:   Taking:         Reviewed prior external information including notes and imaging from previsou exam, outside providers and external EMR if available.   As well as notes that were available from care everywhere and other healthcare systems.  Past medical history, social, surgical and family history all reviewed in electronic medical record.  No pertanent information unless stated regarding to the chief complaint.   Past Medical History:  Diagnosis Date   Achilles tendinitis    Arthritis    Asthma    Clotting disorder (HCC)    Diverticulitis    Diverticulosis    GERD (gastroesophageal reflux disease)    Heart murmur    as a child   History of blood clots    History of chicken pox    History of pulmonary embolus (PE) 2018   Hyperlipidemia    Hypertension    Liver cirrhosis secondary to NASH (nonalcoholic steatohepatitis) (HCC)    Osteoarthritis    Phlebitis    Plantar fasciitis    Sleep apnea    not wearing CPAP at this time   Vertigo     No Known Allergies   Review of Systems:  No headache, visual changes, nausea, vomiting, diarrhea, constipation, dizziness, abdominal pain, skin rash, fevers, chills, night sweats, weight loss, swollen lymph nodes, body aches, joint swelling, chest pain, shortness of breath, mood changes. POSITIVE muscle aches  Objective  Blood pressure  110/70, pulse 71, height 5' 6 (1.676 m), weight 221 lb (100.2 kg), SpO2 98%.   General: No apparent distress alert and oriented x3 mood and affect normal, dressed appropriately.  HEENT: Pupils equal, extraocular movements intact  Respiratory: Patient's speak in full sentences and does not appear short of breath  Cardiovascular: No lower extremity edema, non tender, no erythema  Gait MSK:  Back loss of lordosis.  Significant tightness noted with pain in the ischial bursa area on the left side.  Positive pain in the hamstring with resisted flexion.  Discussed icing regimen.  Follow-up again in 6 to 8 weeks.  Osteopathic findings  C2 flexed rotated and side bent right T3 extended rotated and side bent right inhaled rib T9 extended rotated and side bent left L2 flexed rotated and side bent right L3 flexed rotated sidebent left. Sacrum right on right       Assessment and Plan:  Chronic low back pain with left-sided sciatica Increasing low back pain with increasing radicular symptoms in the left leg.  Discussed different treatment options.  Patient still elected to attempt osteopathic manipulation.  Feels like that has been more beneficial than anything else.  Discussed different medications.  Patient wants to avoid adding anything else at this time patient does have findings that are consistent with more of a hamstring injury acutely on the side as well as the  lumbar radiculopathy.  Discussed thigh compression sleeve, heel lifts, icing regimen, avoiding deceleration movements and following up with me again 6 to 8 weeks.    Nonallopathic problems  Decision today to treat with OMT was based on Physical Exam  After verbal consent patient was treated with HVLA, ME, FPR techniques in cervical, rib, thoracic, lumbar, and sacral  areas  Patient tolerated the procedure well with improvement in symptoms  Patient given exercises, stretches and lifestyle modifications  See medications in  patient instructions if given  Patient will follow up in 4-8 weeks    The above documentation has been reviewed and is accurate and complete Isabel Delange M Maurice Fotheringham, DO          Note: This dictation was prepared with Dragon dictation along with smaller phrase technology. Any transcriptional errors that result from this process are unintentional.

## 2023-07-28 NOTE — Progress Notes (Signed)
 Isabel Garcia arrived 07/28/2023 and has given verbal consent to obtain images and complete their overdue diabetic retinal screening.  The images have been sent to an ophthalmologist or optometrist for review and interpretation.  Results will be sent back to Isabel Suzzane POUR, MD for review.  Patient has been informed they will be contacted when we receive the results via telephone or MyChart

## 2023-08-04 ENCOUNTER — Ambulatory Visit: Payer: Medicare (Managed Care) | Admitting: Family Medicine

## 2023-08-04 VITALS — BP 110/70 | HR 71 | Ht 66.0 in | Wt 221.0 lb

## 2023-08-04 DIAGNOSIS — M5442 Lumbago with sciatica, left side: Secondary | ICD-10-CM | POA: Diagnosis not present

## 2023-08-04 DIAGNOSIS — M9904 Segmental and somatic dysfunction of sacral region: Secondary | ICD-10-CM

## 2023-08-04 DIAGNOSIS — M9901 Segmental and somatic dysfunction of cervical region: Secondary | ICD-10-CM

## 2023-08-04 DIAGNOSIS — G8929 Other chronic pain: Secondary | ICD-10-CM | POA: Diagnosis not present

## 2023-08-04 DIAGNOSIS — M9902 Segmental and somatic dysfunction of thoracic region: Secondary | ICD-10-CM | POA: Diagnosis not present

## 2023-08-04 DIAGNOSIS — M9903 Segmental and somatic dysfunction of lumbar region: Secondary | ICD-10-CM | POA: Diagnosis not present

## 2023-08-04 DIAGNOSIS — M9908 Segmental and somatic dysfunction of rib cage: Secondary | ICD-10-CM

## 2023-08-04 NOTE — Patient Instructions (Signed)
 Good to see you! Do prescribed exercises at least 3x a week  Thigh compression Heel lifts See you again in 2-3 months If pains worsen can come in for cocktail injection

## 2023-08-05 ENCOUNTER — Encounter: Payer: Self-pay | Admitting: Family Medicine

## 2023-08-05 NOTE — Assessment & Plan Note (Signed)
 Increasing low back pain with increasing radicular symptoms in the left leg.  Discussed different treatment options.  Patient still elected to attempt osteopathic manipulation.  Feels like that has been more beneficial than anything else.  Discussed different medications.  Patient wants to avoid adding anything else at this time patient does have findings that are consistent with more of a hamstring injury acutely on the side as well as the lumbar radiculopathy.  Discussed thigh compression sleeve, heel lifts, icing regimen, avoiding deceleration movements and following up with me again 6 to 8 weeks.

## 2023-08-25 DIAGNOSIS — R632 Polyphagia: Secondary | ICD-10-CM | POA: Diagnosis not present

## 2023-08-25 DIAGNOSIS — I10 Essential (primary) hypertension: Secondary | ICD-10-CM | POA: Diagnosis not present

## 2023-08-25 DIAGNOSIS — E66812 Obesity, class 2: Secondary | ICD-10-CM | POA: Diagnosis not present

## 2023-08-25 DIAGNOSIS — Z6835 Body mass index (BMI) 35.0-35.9, adult: Secondary | ICD-10-CM | POA: Diagnosis not present

## 2023-08-25 DIAGNOSIS — R7303 Prediabetes: Secondary | ICD-10-CM | POA: Diagnosis not present

## 2023-08-25 DIAGNOSIS — E782 Mixed hyperlipidemia: Secondary | ICD-10-CM | POA: Diagnosis not present

## 2023-09-30 NOTE — Progress Notes (Signed)
 [facility] [address] [place] [address] Phone: [phone] Subjective:   Isabel Garcia, am serving as a scribe for Dr. Arthea Claudene.  I'm seeing this patient by the request  of:  Tobie Suzzane POUR, MD  CC: Back and neck pain follow-up  YEP:Dlagzrupcz  Isabel Garcia is a 68 y.o. female coming in with complaint of back and neck pain. OMT on 08/04/2023. Patient states continues to have some discomfort.  Seems to be more in the low back.  Describes it is more of a stiffness.  Medications patient has been prescribed:   Taking:         Reviewed prior external information including notes and imaging from previsou exam, outside providers and external EMR if available.   As well as notes that were available from care everywhere and other healthcare systems.  Past medical history, social, surgical and family history all reviewed in electronic medical record.  No pertanent information unless stated regarding to the chief complaint.   Past Medical History:  Diagnosis Date   Achilles tendinitis    Arthritis    Asthma    Clotting disorder (HCC)    Diverticulitis    Diverticulosis    GERD (gastroesophageal reflux disease)    Heart murmur    as a child   History of blood clots    History of chicken pox    History of pulmonary embolus (PE) 2018   Hyperlipidemia    Hypertension    Liver cirrhosis secondary to NASH (nonalcoholic steatohepatitis) (HCC)    Osteoarthritis    Phlebitis    Plantar fasciitis    Sleep apnea    not wearing CPAP at this time   Vertigo     No Known Allergies   Review of Systems:  No headache, visual changes, nausea, vomiting, diarrhea, constipation, dizziness, abdominal pain, skin rash, fevers, chills, night sweats, weight loss, swollen lymph nodes, body aches, joint swelling, chest pain, shortness of breath, mood changes. POSITIVE muscle aches  Objective  Blood pressure 120/60, pulse 83, height 5' 6 (1.676 m),  weight 224 lb (101.6 kg), SpO2 98%.   General: No apparent distress alert and oriented x3 mood and affect normal, dressed appropriately.  HEENT: Pupils equal, extraocular movements intact  Respiratory: Patient's speak in full sentences and does not appear short of breath  Cardiovascular: No lower extremity edema, non tender, no erythema  Gait normal MSK:  Back continues to have more discomfort noted in the paraspinal musculature of the lumbar spine on the left side.  Still tightness noted with left-sided straight leg test.  Osteopathic findings  C2 flexed rotated and side bent right C6 flexed rotated and side bent left T3 extended rotated and side bent right inhaled rib T9 extended rotated and side bent left L2 flexed rotated and side bent right Sacrum right on right       Assessment and Plan:  Chronic low back pain with left-sided sciatica Low back pain that is consistent with some radicular symptoms 2.  Discussed icing regimen and home exercises, discussed which activities to do and which ones to avoid.  Increase activity slowly.  Discussed icing regimen.  Follow-up again 6 to 8 weeks.  No changes in medication.  Did discuss the possibility of epidurals.  Patient would like to avoid that at this moment.    Nonallopathic problems  Decision today to treat with OMT was based on Physical Exam  After verbal consent patient was treated with  HVLA, ME, FPR techniques in cervical, rib, thoracic, lumbar, and sacral  areas  Patient tolerated the procedure well with improvement in symptoms  Patient given exercises, stretches and lifestyle modifications  See medications in patient instructions if given  Patient will follow up in 4-8 weeks    The above documentation has been reviewed and is accurate and complete Arthea CHRISTELLA Sharps, DO          Note: This dictation was prepared with Dragon dictation along with smaller phrase technology. Any transcriptional errors that result from  this process are unintentional.

## 2023-10-06 ENCOUNTER — Ambulatory Visit: Payer: Medicare (Managed Care) | Admitting: Family Medicine

## 2023-10-06 ENCOUNTER — Encounter: Payer: Self-pay | Admitting: Family Medicine

## 2023-10-06 VITALS — BP 120/60 | HR 83 | Ht 66.0 in | Wt 224.0 lb

## 2023-10-06 DIAGNOSIS — M9908 Segmental and somatic dysfunction of rib cage: Secondary | ICD-10-CM

## 2023-10-06 DIAGNOSIS — M9901 Segmental and somatic dysfunction of cervical region: Secondary | ICD-10-CM | POA: Diagnosis not present

## 2023-10-06 DIAGNOSIS — M9903 Segmental and somatic dysfunction of lumbar region: Secondary | ICD-10-CM | POA: Diagnosis not present

## 2023-10-06 DIAGNOSIS — M5442 Lumbago with sciatica, left side: Secondary | ICD-10-CM | POA: Diagnosis not present

## 2023-10-06 DIAGNOSIS — M9904 Segmental and somatic dysfunction of sacral region: Secondary | ICD-10-CM | POA: Diagnosis not present

## 2023-10-06 DIAGNOSIS — G8929 Other chronic pain: Secondary | ICD-10-CM | POA: Diagnosis not present

## 2023-10-06 DIAGNOSIS — M9902 Segmental and somatic dysfunction of thoracic region: Secondary | ICD-10-CM | POA: Diagnosis not present

## 2023-10-06 NOTE — Patient Instructions (Signed)
 Chance for provider at [facility] See me in 2 months

## 2023-10-06 NOTE — Assessment & Plan Note (Signed)
 Low back pain that is consistent with some radicular symptoms 2.  Discussed icing regimen and home exercises, discussed which activities to do and which ones to avoid.  Increase activity slowly.  Discussed icing regimen.  Follow-up again 6 to 8 weeks.  No changes in medication.  Did discuss the possibility of epidurals.  Patient would like to avoid that at this moment.

## 2023-10-21 DIAGNOSIS — Z6835 Body mass index (BMI) 35.0-35.9, adult: Secondary | ICD-10-CM | POA: Diagnosis not present

## 2023-10-21 DIAGNOSIS — E782 Mixed hyperlipidemia: Secondary | ICD-10-CM | POA: Diagnosis not present

## 2023-10-21 DIAGNOSIS — R632 Polyphagia: Secondary | ICD-10-CM | POA: Diagnosis not present

## 2023-10-21 DIAGNOSIS — R7303 Prediabetes: Secondary | ICD-10-CM | POA: Diagnosis not present

## 2023-10-21 DIAGNOSIS — E66812 Obesity, class 2: Secondary | ICD-10-CM | POA: Diagnosis not present

## 2023-10-21 DIAGNOSIS — I10 Essential (primary) hypertension: Secondary | ICD-10-CM | POA: Diagnosis not present

## 2023-10-22 ENCOUNTER — Ambulatory Visit: Payer: Medicare (Managed Care)

## 2023-11-19 ENCOUNTER — Ambulatory Visit (INDEPENDENT_AMBULATORY_CARE_PROVIDER_SITE_OTHER): Payer: Medicare (Managed Care) | Admitting: Internal Medicine

## 2023-11-19 ENCOUNTER — Encounter: Payer: Self-pay | Admitting: Internal Medicine

## 2023-11-19 VITALS — BP 126/75 | HR 65 | Ht 66.0 in | Wt 223.0 lb

## 2023-11-19 DIAGNOSIS — Z23 Encounter for immunization: Secondary | ICD-10-CM

## 2023-11-19 DIAGNOSIS — R7303 Prediabetes: Secondary | ICD-10-CM | POA: Diagnosis not present

## 2023-11-19 DIAGNOSIS — G4733 Obstructive sleep apnea (adult) (pediatric): Secondary | ICD-10-CM | POA: Diagnosis not present

## 2023-11-19 DIAGNOSIS — I10 Essential (primary) hypertension: Secondary | ICD-10-CM | POA: Diagnosis not present

## 2023-11-19 DIAGNOSIS — Z0001 Encounter for general adult medical examination with abnormal findings: Secondary | ICD-10-CM

## 2023-11-19 DIAGNOSIS — K219 Gastro-esophageal reflux disease without esophagitis: Secondary | ICD-10-CM

## 2023-11-19 DIAGNOSIS — E782 Mixed hyperlipidemia: Secondary | ICD-10-CM

## 2023-11-19 DIAGNOSIS — E559 Vitamin D deficiency, unspecified: Secondary | ICD-10-CM

## 2023-11-19 DIAGNOSIS — J4520 Mild intermittent asthma, uncomplicated: Secondary | ICD-10-CM | POA: Diagnosis not present

## 2023-11-19 MED ORDER — TIRZEPATIDE-WEIGHT MANAGEMENT 2.5 MG/0.5ML ~~LOC~~ SOAJ: 2.5000 mg | SUBCUTANEOUS | 0 refills | Status: AC

## 2023-11-19 MED ORDER — OMEPRAZOLE 20 MG PO CPDR: 20.0000 mg | DELAYED_RELEASE_CAPSULE | Freq: Every day | ORAL | 5 refills | Status: AC

## 2023-11-19 MED ORDER — BENZONATATE 100 MG PO CAPS: 100.0000 mg | ORAL_CAPSULE | Freq: Two times a day (BID) | ORAL | 2 refills | Status: AC | PRN

## 2023-11-19 NOTE — Assessment & Plan Note (Signed)
 BP Readings from Last 1 Encounters:  11/19/23 126/75   Well-controlled with amlodipine  10 mg QD Counseled for compliance with the medications Advised DASH diet and moderate exercise/walking, at least 150 mins/week

## 2023-11-19 NOTE — Assessment & Plan Note (Signed)
 Advised to follow DASH diet ?Check lipid profile ?

## 2023-11-19 NOTE — Assessment & Plan Note (Addendum)
 Sleep study in 2019 showed moderate OSA Does not use CPAP regularly, could not tolerate it Will obtain updated home sleep study Was seen by Dr Okey recently  Would benefit from Zepbound - SURMOUNT trial showed significant improvement in patients with OSA Discussed about potential benefits and side effects of Zepbound with the patient Started Zepbound 2.5 mg QW, plan to increase dose as tolerated

## 2023-11-19 NOTE — Progress Notes (Addendum)
 Established Patient Office Visit  Subjective:  Patient ID: Isabel Garcia, female    DOB: December 21, 1955  Age: 68 y.o. MRN: [id number]  CC:  Chief Complaint  Patient presents with   Annual Exam   Cough    Constant dry cough     HPI Isabel Garcia is a 68 y.o. female with past medical history of HTN, OSA, GERD, PE/DVT and obesity who presents for f/u of her chronic medical conditions.  HTN: BP is well-controlled. Takes medications regularly. Patient denies headache, dizziness, dyspnea or palpitations.   Asthma: She has chronic cough, and recently worse for the last 1 month.  She uses albuterol  inhaler as needed for dyspnea or wheezing.  Denies any recent fever or chills.  OSA: She has a history of OSA, but could not tolerate CPAP device in the past.  She reports snoring at nighttime and daytime fatigue.  She takes omeprazole  as needed for GERD, but has not required it recently.  She denies any dysphagia or odynophagia currently.   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  She is supposed to be on indefinite AC, and is on Eliquis .  She sees Dr. Arthea Sharps for chronic low back pain.  She takes Mobic  for it.  She also sees Dr. Bari for weight management.     Past Medical History:  Diagnosis Date   Achilles tendinitis    Arthritis    Asthma    Clotting disorder    Diverticulitis    Diverticulosis    GERD (gastroesophageal reflux disease)    Heart murmur    as a child   History of blood clots    History of chicken pox    History of pulmonary embolus (PE) 2018   Hyperlipidemia    Hypertension    Liver cirrhosis secondary to NASH (nonalcoholic steatohepatitis) (HCC)    Osteoarthritis    Phlebitis    Plantar fasciitis    Sleep apnea    not wearing CPAP at this time   Vertigo     Past Surgical History:  Procedure Laterality Date   ABDOMINAL HYSTERECTOMY     bone spur removal     COLONOSCOPY     KNEE ARTHROSCOPY Right     KNEE ARTHROSCOPY Right    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   Procedure: TOTAL KNEE ARTHROPLASTY;  Surgeon: Ernie Cough, MD;  Location: [facility];  Service: Orthopedics;  Laterality: Right;  70 mins    Family History  Problem Relation Age of Onset   Arthritis Mother    Hypertension Mother    Hypertension Father    Heart attack Father    Early death Father    Asthma Sister    Heart attack Brother    Early death Brother    Hypertension Brother    Stroke Maternal Grandmother    Heart attack Maternal Grandfather    Heart attack Paternal Grandfather    Early death Paternal Grandfather    Colon cancer Neg Hx    Stomach cancer Neg Hx    Pancreatic cancer Neg Hx    Esophageal cancer Neg Hx    Rectal cancer Neg Hx     Social History   Socioeconomic History   Marital status: Divorced    Spouse name: Not on file   Number of children: 1   Years of education: Not on file   Highest education level: Not on file  Occupational History   Occupation:  Disabled  Tobacco Use   Smoking status: Never   Smokeless tobacco: Never  Vaping Use   Vaping status: Never Used  Substance and Sexual Activity   Alcohol use: Not Currently   Drug use: No   Sexual activity: Not Currently  Other Topics Concern   Not on file  Social History Narrative   Not on file   Social Drivers of Health   Financial Resource Strain: Low Risk  (06/09/2022)   Overall Financial Resource Strain (CARDIA)    Difficulty of Paying Living Expenses: Not hard at all  Food Insecurity: No Food Insecurity (06/09/2022)   Hunger Vital Sign    Worried About Running Out of Food in the Last Year: Never true    Ran Out of Food in the Last Year: Never true  Transportation Needs: No Transportation Needs (06/09/2022)   PRAPARE - Administrator, Civil Service (Medical): No    Lack of Transportation (Non-Medical): No  Physical Activity: Sufficiently Active (06/09/2022)   Exercise Vital Sign    Days of Exercise per Week: 5  days    Minutes of Exercise per Session: 60 min  Stress: No Stress Concern Present (06/09/2022)   [employer] of Occupational Health - Occupational Stress Questionnaire    Feeling of Stress : Not at all  Social Connections: Moderately Integrated (06/09/2022)   Social Connection and Isolation Panel    Frequency of Communication with Friends and Family: More than three times a week    Frequency of Social Gatherings with Friends and Family: More than three times a week    Attends Religious Services: More than 4 times per year    Active Member of [employer] or Organizations: Yes    Attends Engineer, Structural: More than 4 times per year    Marital Status: Divorced  Intimate Partner Violence: Not At Risk (06/09/2022)   Humiliation, Afraid, Rape, and Kick questionnaire    Fear of Current or Ex-Partner: No    Emotionally Abused: No    Physically Abused: No    Sexually Abused: No    Outpatient Medications Prior to Visit  Medication Sig Dispense Refill   albuterol  (VENTOLIN  HFA) 108 (90 Base) MCG/ACT inhaler Inhale 2 puffs into the lungs every 6 (six) hours as needed for wheezing or shortness of breath.      amLODipine  (NORVASC ) 10 MG tablet Take 1 tablet (10 mg total) by mouth daily. 30 tablet 5   apixaban  (ELIQUIS ) 5 MG TABS tablet Take 1 tablet (5 mg total) by mouth 2 (two) times daily. 60 tablet 5   cetirizine  (ZYRTEC ) 10 MG tablet Take 1 tablet (10 mg total) by mouth daily. 30 tablet 5   COD LIVER OIL PO Take 1 capsule by mouth daily.     cyclobenzaprine  (FLEXERIL ) 5 MG tablet Take 1 tablet (5 mg total) by mouth 2 (two) times daily as needed for muscle spasms. 30 tablet 1   fluticasone  (FLONASE ) 50 MCG/ACT nasal spray Place 2 sprays into both nostrils daily. 16 g 11   fluticasone -salmeterol (ADVAIR DISKUS) 250-50 MCG/ACT AEPB Inhale 1 puff into the lungs in the morning and at bedtime. 60 each 5   Iron-Vitamin C (IRON 100/C) 100-250 MG TABS Take by mouth.     lidocaine  (LIDODERM ) 5 %  Place 1 patch onto the skin daily. Remove & Discard patch within 12 hours or as directed by MD 30 patch 0   meloxicam  (MOBIC ) 15 MG tablet Take 1 tablet (15 mg total) by  mouth daily. 30 tablet 0   TOPAMAX 50 MG tablet 1 tablet Orally Once a day for 30 days     Zinc Sulfate (ZINC 15 PO) Take 1 capsule by mouth daily.     benzonatate  (TESSALON ) 100 MG capsule Take 1 capsule (100 mg total) by mouth 2 (two) times daily as needed for cough. 30 capsule 1   No facility-administered medications prior to visit.    No Known Allergies  ROS Review of Systems  Constitutional:  Negative for chills and fever.  HENT:  Negative for congestion, sinus pressure, sinus pain and sore throat.   Eyes:  Negative for pain and discharge.  Respiratory:  Positive for cough. Negative for shortness of breath.   Cardiovascular:  Negative for chest pain and palpitations.  Gastrointestinal:  Negative for abdominal pain, diarrhea, nausea and vomiting.  Endocrine: Negative for polydipsia and polyuria.  Genitourinary:  Negative for dysuria and hematuria.  Musculoskeletal:  Negative for neck pain and neck stiffness.  Skin:  Negative for rash.  Neurological:  Negative for dizziness and weakness.  Psychiatric/Behavioral:  Negative for agitation and behavioral problems.       Objective:    Physical Exam Vitals reviewed.  Constitutional:      General: She is not in acute distress.    Appearance: She is not diaphoretic.  HENT:     Head: Normocephalic and atraumatic.     Nose: Nose normal.     Mouth/Throat:     Mouth: Mucous membranes are moist.  Eyes:     General: No scleral icterus.    Extraocular Movements: Extraocular movements intact.  Cardiovascular:     Rate and Rhythm: Normal rate and regular rhythm.     Heart sounds: Normal heart sounds. No murmur heard. Pulmonary:     Breath sounds: Normal breath sounds. No wheezing or rales.  Abdominal:     Palpations: Abdomen is soft.     Tenderness: There is no  abdominal tenderness.  Musculoskeletal:     Cervical back: Neck supple. No tenderness.     Right lower leg: No edema.     Left lower leg: No edema.  Skin:    General: Skin is warm.     Findings: No rash.  Neurological:     General: No focal deficit present.     Mental Status: She is alert and oriented to person, place, and time.     Sensory: No sensory deficit.     Motor: No weakness.  Psychiatric:        Mood and Affect: Mood normal.        Behavior: Behavior normal.     BP 126/75   Pulse 65   Ht 5' 6 (1.676 m)   Wt 223 lb (101.2 kg)   SpO2 98%   BMI 35.99 kg/m  Wt Readings from Last 3 Encounters:  11/19/23 223 lb (101.2 kg)  10/06/23 224 lb (101.6 kg)  08/04/23 221 lb (100.2 kg)    Lab Results  Component Value Date   TSH 1.130 11/19/2023   Lab Results  Component Value Date   WBC 5.8 11/19/2023   HGB 13.8 11/19/2023   HCT 41.6 11/19/2023   MCV 94 11/19/2023   PLT 364 11/19/2023   Lab Results  Component Value Date   NA 140 11/19/2023   K 4.7 11/19/2023   CO2 23 11/19/2023   GLUCOSE 87 11/19/2023   BUN 12 11/19/2023   CREATININE 0.79 11/19/2023   BILITOT 0.3 11/19/2023  ALKPHOS 83 11/19/2023   AST 39 11/19/2023   ALT 45 (H) 11/19/2023   PROT 7.9 11/19/2023   ALBUMIN 4.5 11/19/2023   CALCIUM  9.9 11/19/2023   ANIONGAP 10 06/04/2023   EGFR 82 11/19/2023   GFR 83.37 01/22/2021   Lab Results  Component Value Date   CHOL 207 (H) 11/19/2023   Lab Results  Component Value Date   HDL 55 11/19/2023   Lab Results  Component Value Date   LDLCALC 125 (H) 11/19/2023   Lab Results  Component Value Date   TRIG 153 (H) 11/19/2023   Lab Results  Component Value Date   CHOLHDL 3.8 11/19/2023   Lab Results  Component Value Date   HGBA1C 5.6 11/19/2023      Assessment & Plan:   Problem List Items Addressed This Visit       Cardiovascular and Mediastinum   Essential hypertension   BP Readings from Last 1 Encounters:  11/19/23 126/75    Well-controlled with amlodipine  10 mg QD Counseled for compliance with the medications Advised DASH diet and moderate exercise/walking, at least 150 mins/week      Relevant Orders   TSH (Completed)   CMP14+EGFR (Completed)   CBC with Differential/Platelet (Completed)     Respiratory   Obstructive sleep apnea   Sleep study in 2019 showed moderate OSA Does not use CPAP regularly, could not tolerate it Will obtain updated home sleep study Was seen by Dr Okey recently  Would benefit from Zepbound - SURMOUNT trial showed significant improvement in patients with OSA Discussed about potential benefits and side effects of Zepbound with the patient Started Zepbound 2.5 mg QW, plan to increase dose as tolerated      Relevant Medications   tirzepatide (ZEPBOUND) 2.5 MG/0.5ML Pen   Other Relevant Orders   Home sleep test   Asthma   Has Advair as maintenance treatment, needs to be compliant Albuterol  inhaler as needed for dyspnea or wheezing Tessalon  PRN for cough      Relevant Medications   benzonatate  (TESSALON ) 100 MG capsule     Digestive   GERD (gastroesophageal reflux disease)   Her chronic, recurrent cough could be related to GERD Started omeprazole  20 mg QD again Advised to take Pepcid  20 mg QPM if persistent cough/epigastric discomfort      Relevant Medications   omeprazole  (PRILOSEC) 20 MG capsule     Other   Morbid obesity (HCC)   BMI Readings from Last 3 Encounters:  11/19/23 35.99 kg/m  10/06/23 36.15 kg/m  08/04/23 35.67 kg/m   Associated with HTN, HLD, OSA and OA of knee Advised to follow DASH diet and perform moderate exercise as tolerated Followed by Dr. Bari for weight management as well Started Zepbound for OSA      Relevant Medications   tirzepatide (ZEPBOUND) 2.5 MG/0.5ML Pen   Mixed hyperlipidemia   Advised to follow DASH diet Check lipid profile      Relevant Orders   Lipid panel (Completed)   Encounter for general adult medical  examination with abnormal findings - Primary   Physical exam as documented. Fasting blood tests ordered. Flu vaccine today.      Prediabetes   Lab Results  Component Value Date   HGBA1C 5.8 (H) 05/14/2023   Advised to follow DASH diet for HTN      Relevant Orders   Hemoglobin A1c (Completed)   Other Visit Diagnoses       Vitamin D  deficiency       Relevant  Orders   VITAMIN D  25 Hydroxy (Vit-D Deficiency, Fractures) (Completed)     Encounter for immunization       Relevant Orders   Flu vaccine HIGH DOSE PF(Fluzone Trivalent) (Completed)        Meds ordered this encounter  Medications   benzonatate  (TESSALON ) 100 MG capsule    Sig: Take 1 capsule (100 mg total) by mouth 2 (two) times daily as needed for cough.    Dispense:  30 capsule    Refill:  2   omeprazole  (PRILOSEC) 20 MG capsule    Sig: Take 1 capsule (20 mg total) by mouth daily.    Dispense:  30 capsule    Refill:  5   tirzepatide (ZEPBOUND) 2.5 MG/0.5ML Pen    Sig: Inject 2.5 mg into the skin once a week.    Dispense:  2 mL    Refill:  0    Follow-up: Return in about 2 months (around 01/19/2024) for Weight management.    Suzzane MARLA Blanch, MD

## 2023-11-19 NOTE — Assessment & Plan Note (Addendum)
 BMI Readings from Last 3 Encounters:  11/19/23 35.99 kg/m  10/06/23 36.15 kg/m  08/04/23 35.67 kg/m   Associated with HTN, HLD, OSA and OA of knee Advised to follow DASH diet and perform moderate exercise as tolerated Followed by Dr. Bari for weight management as well Started Zepbound for OSA

## 2023-11-19 NOTE — Assessment & Plan Note (Signed)
 Has Advair as maintenance treatment, needs to be compliant Albuterol  inhaler as needed for dyspnea or wheezing Tessalon  PRN for cough

## 2023-11-19 NOTE — Assessment & Plan Note (Signed)
 Her chronic, recurrent cough could be related to GERD Started omeprazole  20 mg QD again Advised to take Pepcid  20 mg QPM if persistent cough/epigastric discomfort

## 2023-11-19 NOTE — Assessment & Plan Note (Addendum)
Physical exam as documented. Fasting blood tests ordered. Flu vaccine today. 

## 2023-11-19 NOTE — Assessment & Plan Note (Signed)
 Lab Results  Component Value Date   HGBA1C 5.8 (H) 05/14/2023   Advised to follow DASH diet for HTN

## 2023-11-19 NOTE — Patient Instructions (Signed)
 Please start taking Zepbound 2.5 mg once weekly.  Please follow small, frequent meals.  Please take Tessalon  as needed for cough.  Please start taking Omeprazole  for acid reflux.  Please continue to take medications as prescribed.  Please continue to follow low carb diet and perform moderate exercise/walking at least 150 mins/week.

## 2023-11-20 ENCOUNTER — Ambulatory Visit: Payer: Self-pay | Admitting: Internal Medicine

## 2023-11-20 LAB — CBC WITH DIFFERENTIAL/PLATELET
Basophils Absolute: 0 x10E3/uL (ref 0.0–0.2)
Basos: 1 %
EOS (ABSOLUTE): 0 x10E3/uL (ref 0.0–0.4)
Eos: 0 %
Hematocrit: 41.6 % (ref 34.0–46.6)
Hemoglobin: 13.8 g/dL (ref 11.1–15.9)
Immature Grans (Abs): 0 x10E3/uL (ref 0.0–0.1)
Immature Granulocytes: 0 %
Lymphocytes Absolute: 2.2 x10E3/uL (ref 0.7–3.1)
Lymphs: 39 %
MCH: 31 pg (ref 26.6–33.0)
MCHC: 33.2 g/dL (ref 31.5–35.7)
MCV: 94 fL (ref 79–97)
Monocytes Absolute: 0.6 x10E3/uL (ref 0.1–0.9)
Monocytes: 10 %
Neutrophils Absolute: 2.9 x10E3/uL (ref 1.4–7.0)
Neutrophils: 50 %
Platelets: 364 x10E3/uL (ref 150–450)
RBC: 4.45 x10E6/uL (ref 3.77–5.28)
RDW: 13.2 % (ref 11.7–15.4)
WBC: 5.8 x10E3/uL (ref 3.4–10.8)

## 2023-11-20 LAB — CMP14+EGFR
ALT: 45 IU/L — ABNORMAL HIGH (ref 0–32)
AST: 39 IU/L (ref 0–40)
Albumin: 4.5 g/dL (ref 3.9–4.9)
Alkaline Phosphatase: 83 IU/L (ref 49–135)
BUN/Creatinine Ratio: 15 (ref 12–28)
BUN: 12 mg/dL (ref 8–27)
Bilirubin Total: 0.3 mg/dL (ref 0.0–1.2)
CO2: 23 mmol/L (ref 20–29)
Calcium: 9.9 mg/dL (ref 8.7–10.3)
Chloride: 101 mmol/L (ref 96–106)
Creatinine, Ser: 0.79 mg/dL (ref 0.57–1.00)
Globulin, Total: 3.4 g/dL (ref 1.5–4.5)
Glucose: 87 mg/dL (ref 70–99)
Potassium: 4.7 mmol/L (ref 3.5–5.2)
Sodium: 140 mmol/L (ref 134–144)
Total Protein: 7.9 g/dL (ref 6.0–8.5)
eGFR: 82 mL/min/1.73 (ref >59)

## 2023-11-20 LAB — VITAMIN D 25 HYDROXY (VIT D DEFICIENCY, FRACTURES): Vit D, 25-Hydroxy: 39.7 ng/mL (ref 30.0–100.0)

## 2023-11-20 LAB — LIPID PANEL
Chol/HDL Ratio: 3.8 ratio (ref 0.0–4.4)
Cholesterol, Total: 207 mg/dL — ABNORMAL HIGH (ref 100–199)
HDL: 55 mg/dL (ref >39)
LDL Chol Calc (NIH): 125 mg/dL — ABNORMAL HIGH (ref 0–99)
Triglycerides: 153 mg/dL — ABNORMAL HIGH (ref 0–149)
VLDL Cholesterol Cal: 27 mg/dL (ref 5–40)

## 2023-11-20 LAB — TSH: TSH: 1.13 u[IU]/mL (ref 0.450–4.500)

## 2023-11-20 LAB — HEMOGLOBIN A1C
Est. average glucose Bld gHb Est-mCnc: 114 mg/dL
Hgb A1c MFr Bld: 5.6 % (ref 4.8–5.6)

## 2023-12-01 ENCOUNTER — Telehealth: Payer: Self-pay

## 2023-12-01 NOTE — Telephone Encounter (Unsigned)
 Copied from CRM [id number]. Topic: Clinical - Medication Question >> Dec 01, 2023  3:36 PM Isabel Garcia wrote: Reason for CRM: Patient called.. wants to know if tirzepatide (ZEPBOUND) 2.5 MG/0.5ML Pen is approved to fill yet.. she has been waiting to hear back including the cost

## 2023-12-02 ENCOUNTER — Other Ambulatory Visit (HOSPITAL_COMMUNITY): Payer: Self-pay

## 2023-12-02 ENCOUNTER — Telehealth: Payer: Self-pay | Admitting: Pharmacy Technician

## 2023-12-02 DIAGNOSIS — E66811 Obesity, class 1: Secondary | ICD-10-CM | POA: Diagnosis not present

## 2023-12-02 DIAGNOSIS — R632 Polyphagia: Secondary | ICD-10-CM | POA: Diagnosis not present

## 2023-12-02 DIAGNOSIS — R7303 Prediabetes: Secondary | ICD-10-CM | POA: Diagnosis not present

## 2023-12-02 DIAGNOSIS — E782 Mixed hyperlipidemia: Secondary | ICD-10-CM | POA: Diagnosis not present

## 2023-12-02 DIAGNOSIS — G4733 Obstructive sleep apnea (adult) (pediatric): Secondary | ICD-10-CM | POA: Diagnosis not present

## 2023-12-02 DIAGNOSIS — I10 Essential (primary) hypertension: Secondary | ICD-10-CM | POA: Diagnosis not present

## 2023-12-02 DIAGNOSIS — Z6834 Body mass index (BMI) 34.0-34.9, adult: Secondary | ICD-10-CM | POA: Diagnosis not present

## 2023-12-02 NOTE — Telephone Encounter (Signed)
 Clinical questions and answers along with supporting documentation has been submitted and is pending. I will provide an update once a determination is received back from the insurance.

## 2023-12-02 NOTE — Telephone Encounter (Signed)
 Pharmacy Patient Advocate Encounter   Received notification from Pt Calls Messages that prior authorization for Zepbound 2.5MG /0.5ML pen-injectors is required/requested.   Insurance verification completed.   The patient is insured through [employer].   Per test claim: PA required; PA started via CoverMyMeds. KEY [id number] . Please see clinical question(s) below that I am not finding the answer to in their chart and advise.   I see the 10.9. Can you confirm and look behind me?

## 2023-12-02 NOTE — Telephone Encounter (Signed)
 PA request has been Started. New Encounter has been or will be created for follow up. For additional info see Pharmacy telephone encounter from 12/02/2023.

## 2023-12-03 ENCOUNTER — Telehealth: Payer: Self-pay

## 2023-12-03 NOTE — Progress Notes (Unsigned)
 [facility] [address] [place] [address] Phone: [phone] Subjective:   Isabel Garcia, am serving as a scribe for Dr. Arthea Claudene.  I'm seeing this patient by the request  of:  Tobie Suzzane POUR, MD  CC: Back and neck pain follow-up  YEP:Dlagzrupcz  [name] is a 68 y.o. female coming in with complaint of back and neck pain. OMT on 10/06/2023. Patient states that she is the same as last visit. Pain in L lumbar spine and R piriformis. Able to exercise but does have pain with activity.   Medications patient has been prescribed:   Taking:      Most recent laboratory workup of primary care provider was relatively good except for mild elevation in the cholesterol.   Reviewed prior external information including notes and imaging from previsou exam, outside providers and external EMR if available.   As well as notes that were available from care everywhere and other healthcare systems.  Past medical history, social, surgical and family history all reviewed in electronic medical record.  No pertanent information unless stated regarding to the chief complaint.   Past Medical History:  Diagnosis Date   Achilles tendinitis    Arthritis    Asthma    Clotting disorder    Diverticulitis    Diverticulosis    GERD (gastroesophageal reflux disease)    Heart murmur    as a child   History of blood clots    History of chicken pox    History of pulmonary embolus (PE) 2018   Hyperlipidemia    Hypertension    Liver cirrhosis secondary to NASH (nonalcoholic steatohepatitis) (HCC)    Osteoarthritis    Phlebitis    Plantar fasciitis    Sleep apnea    not wearing CPAP at this time   Vertigo     No Known Allergies   Review of Systems:  No headache, visual changes, nausea, vomiting, diarrhea, constipation, dizziness, abdominal pain, skin rash, fevers, chills, night sweats, weight loss, swollen lymph nodes, body aches, joint swelling, chest  pain, shortness of breath, mood changes. POSITIVE muscle aches  Objective  Blood pressure 110/72, pulse (!) 59, height 5' 6 (1.676 m), SpO2 99%.   General: No apparent distress alert and oriented x3 mood and affect normal, dressed appropriately.  HEENT: Pupils equal, extraocular movements intact  Respiratory: Patient's speak in full sentences and does not appear short of breath  Cardiovascular: No lower extremity edema, non tender, no erythema  Gait MSK:  Back does have loss of lordosis    Osteopathic findings  C2 flexed rotated and side bent right C6 flexed rotated and side bent left T3 extended rotated and side bent right inhaled rib T9 extended rotated and side bent left L2 flexed rotated and side bent right Sacrum right on right       Assessment and Plan:  Chronic low back pain with left-sided sciatica Discussed HEP  Discussed which activities to do and which ones to avoid.  Increase meloxicam  7.5 mg to help and discussed potential side effects.  Has had some mild elevation in liver enzymes did take it a 5-day burst when needed.  Do feel the right hamstring is giving her difficulty as well.  Discussed using 5 compression with activities and given eccentric exercises.  Follow-up again in 6 to 12 weeks    Nonallopathic problems  Decision today to treat with OMT was based on Physical Exam  After verbal consent patient  was treated with HVLA, ME, FPR techniques in cervical, rib, thoracic, lumbar, and sacral  areas  Patient tolerated the procedure well with improvement in symptoms  Patient given exercises, stretches and lifestyle modifications  See medications in patient instructions if given  Patient will follow up in 4-8 weeks     The above documentation has been reviewed and is accurate and complete Jasalyn Frysinger M Drae Mitzel, DO         Note: This dictation was prepared with Dragon dictation along with smaller phrase technology. Any transcriptional errors that result  from this process are unintentional.

## 2023-12-03 NOTE — Addendum Note (Signed)
 Addended byBETHA TOBIE DOWNS on: 12/03/2023 11:51 AM   Modules accepted: Orders

## 2023-12-03 NOTE — Telephone Encounter (Signed)
 Home sleep study order faxed

## 2023-12-03 NOTE — Telephone Encounter (Signed)
 Copied from CRM #[id number]. Topic: Clinical - Medication Question >> Dec 03, 2023 10:08 AM Larissa RAMAN wrote: Reason for CRM: Patient states that she was informed that tirzepatide (ZEPBOUND) 2.5 MG/0.5ML Pen was not approved. She is wanting to know if she needs to have a sleep study done in order to have it approved. She is requesting a callback from PCP/nurse.

## 2023-12-03 NOTE — Telephone Encounter (Signed)
 Pharmacy Patient Advocate Encounter  Received notification from [employer] that Prior Authorization for Zepbound 2.5mg /0.41ml has been DENIED.  Full denial letter will be uploaded to the media tab. See denial reason below.   PA #/Case ID/Reference #: [id number]

## 2023-12-03 NOTE — Telephone Encounter (Signed)
 Pt has been informed.

## 2023-12-07 ENCOUNTER — Ambulatory Visit (INDEPENDENT_AMBULATORY_CARE_PROVIDER_SITE_OTHER): Payer: Medicare (Managed Care) | Admitting: Family Medicine

## 2023-12-07 VITALS — BP 110/72 | HR 59 | Ht 66.0 in

## 2023-12-07 DIAGNOSIS — M9908 Segmental and somatic dysfunction of rib cage: Secondary | ICD-10-CM

## 2023-12-07 DIAGNOSIS — G8929 Other chronic pain: Secondary | ICD-10-CM

## 2023-12-07 DIAGNOSIS — M9902 Segmental and somatic dysfunction of thoracic region: Secondary | ICD-10-CM

## 2023-12-07 DIAGNOSIS — M5442 Lumbago with sciatica, left side: Secondary | ICD-10-CM | POA: Diagnosis not present

## 2023-12-07 DIAGNOSIS — M9901 Segmental and somatic dysfunction of cervical region: Secondary | ICD-10-CM

## 2023-12-07 DIAGNOSIS — M9903 Segmental and somatic dysfunction of lumbar region: Secondary | ICD-10-CM

## 2023-12-07 DIAGNOSIS — M9904 Segmental and somatic dysfunction of sacral region: Secondary | ICD-10-CM

## 2023-12-07 MED ORDER — MELOXICAM 7.5 MG PO TABS: 7.5000 mg | ORAL_TABLET | Freq: Every day | ORAL | 0 refills | Status: AC

## 2023-12-07 NOTE — Patient Instructions (Signed)
 Hamstring ex Ace wrap when working out Meloxicam  7.5mg  daily for 5 days See me again in 2 months

## 2023-12-07 NOTE — Assessment & Plan Note (Signed)
 Discussed HEP  Discussed which activities to do and which ones to avoid.  Increase meloxicam  7.5 mg to help and discussed potential side effects.  Has had some mild elevation in liver enzymes did take it a 5-day burst when needed.  Do feel the right hamstring is giving her difficulty as well.  Discussed using 5 compression with activities and given eccentric exercises.  Follow-up again in 6 to 12 weeks

## 2023-12-08 ENCOUNTER — Encounter: Payer: Self-pay | Admitting: Family Medicine

## 2023-12-11 ENCOUNTER — Telehealth: Payer: Self-pay | Admitting: Internal Medicine

## 2023-12-11 NOTE — Telephone Encounter (Unsigned)
 Copied from CRM [id number]. Topic: Referral - Request for Referral >> Dec 11, 2023 11:22 AM Tiffini S wrote: Did the patient discuss referral with their provider in the last year? No (If No - schedule appointment) (If Yes - send message)  Appointment offered? Yes  Type of order/referral and detailed reason for visit: Home Sleep Study   Preference of office, provider, location: N/A  If referral order, have you been seen by this specialty before? Yes/ 2019- too old for medication approval for tirzepatide (ZEPBOUND) 2.5 MG/0.5ML Pen per insurance   (If Yes, this issue or another issue? When? Where? [place]    Can we respond through MyChart? Yes

## 2023-12-14 NOTE — Telephone Encounter (Signed)
 New order with office notes sent to [facility].

## 2023-12-16 NOTE — Telephone Encounter (Signed)
**Note De-Identified Isabel Garcia Obfuscation** I called the pt to discuss the WatchPAT One-HST that Dr Okey ordered at her office visit on 01/06/2023.   The pt stated that she did not receive the VM message that I left her on 07/10/23 advising that she could proceed with her Home Sleep Study.  She states that Dr Tobie ordered her a Home Sleep Study recently and that she plans on doing that study rather than the WatchPAT One-HST that Dr Okey ordered.  She stated that she plans to bring the WatchPAT One-HST Device back to us  so I gave her our new office address at [address] in [place] on the 5th floor. She is aware to leave the device with the staff at one of our check in desks on the 5th floor.  She verbalized understanding to all information given and thanked me for my call.  I will forward this note as FYI to Dr Okey and I have cancelled the [place]-HST.

## 2023-12-26 DIAGNOSIS — G4730 Sleep apnea, unspecified: Secondary | ICD-10-CM | POA: Diagnosis not present

## 2024-01-07 ENCOUNTER — Other Ambulatory Visit (HOSPITAL_COMMUNITY): Payer: Self-pay

## 2024-01-08 ENCOUNTER — Other Ambulatory Visit (HOSPITAL_COMMUNITY): Payer: Self-pay

## 2024-01-08 ENCOUNTER — Telehealth: Payer: Self-pay | Admitting: Pharmacist

## 2024-01-08 NOTE — Telephone Encounter (Signed)
 Insurance has approved the appeal for Zepbound  through 01/07/2025.  Thank you, Devere Pandy, PharmD Clinical Pharmacist  [place]  Direct Dial: [phone]    Patient has been notified!

## 2024-01-08 NOTE — Telephone Encounter (Signed)
 Appeal has been submitted. Will advise when response is received, please be advised that most companies may take 30 days to make a decision. Appeal letter and supporting documentation have been faxed to [phone] on 01/08/2024 @10 :38 am.  Thank you, Devere Pandy, PharmD Clinical Pharmacist  [place]  Direct Dial: [phone]

## 2024-01-13 ENCOUNTER — Telehealth: Payer: Self-pay

## 2024-01-13 NOTE — Telephone Encounter (Signed)
 Message sent to patient

## 2024-01-13 NOTE — Telephone Encounter (Signed)
 Copied from CRM #[id number]. Topic: General - Other >> Jan 08, 2024  4:08 PM Avram MATSU wrote: Reason for CRM: patient called about her rx approval

## 2024-01-14 DIAGNOSIS — R632 Polyphagia: Secondary | ICD-10-CM | POA: Diagnosis not present

## 2024-01-14 DIAGNOSIS — I10 Essential (primary) hypertension: Secondary | ICD-10-CM | POA: Diagnosis not present

## 2024-01-14 DIAGNOSIS — Z6836 Body mass index (BMI) 36.0-36.9, adult: Secondary | ICD-10-CM | POA: Diagnosis not present

## 2024-01-14 DIAGNOSIS — R7303 Prediabetes: Secondary | ICD-10-CM | POA: Diagnosis not present

## 2024-01-14 DIAGNOSIS — E782 Mixed hyperlipidemia: Secondary | ICD-10-CM | POA: Diagnosis not present

## 2024-01-14 DIAGNOSIS — E66811 Obesity, class 1: Secondary | ICD-10-CM | POA: Diagnosis not present

## 2024-01-14 DIAGNOSIS — G4733 Obstructive sleep apnea (adult) (pediatric): Secondary | ICD-10-CM | POA: Diagnosis not present

## 2024-01-20 ENCOUNTER — Encounter: Payer: Self-pay | Admitting: Internal Medicine

## 2024-01-20 ENCOUNTER — Ambulatory Visit: Payer: Medicare (Managed Care) | Admitting: Internal Medicine

## 2024-01-20 VITALS — BP 124/78 | HR 76 | Ht 66.0 in | Wt 224.4 lb

## 2024-01-20 DIAGNOSIS — R7303 Prediabetes: Secondary | ICD-10-CM | POA: Diagnosis not present

## 2024-01-20 DIAGNOSIS — G4733 Obstructive sleep apnea (adult) (pediatric): Secondary | ICD-10-CM | POA: Diagnosis not present

## 2024-01-20 DIAGNOSIS — Z86711 Personal history of pulmonary embolism: Secondary | ICD-10-CM

## 2024-01-20 DIAGNOSIS — I10 Essential (primary) hypertension: Secondary | ICD-10-CM

## 2024-01-20 NOTE — Patient Instructions (Signed)
 Please continue to take medications as prescribed.  Please continue to follow low carb diet and perform moderate exercise/walking at least 150 mins/week.  Please check about Medicare Prescription Payment Plan. [email or web address]

## 2024-01-20 NOTE — Assessment & Plan Note (Signed)
Chart review suggests history of unprovoked PE/DVT in the past Supposed to be on indefinite AC, on Eliquis Had referred to heme-onc for further discussion

## 2024-01-20 NOTE — Assessment & Plan Note (Signed)
 Sleep study in 2019 showed moderate OSA Does not use CPAP regularly, could not tolerate it Home sleep study showed severe sleep apnea - AHI 44.2 on 4% criteria Was seen by Dr Okey  Would benefit from Zepbound  - SURMOUNT trial showed significant improvement in patients with OSA Discussed about potential benefits and side effects of Zepbound  with the patient Started Zepbound  2.5 mg QW, recently approved by insurance in the last week, but co-pay was >$300 -advised to check for Medicare prescription payment plan

## 2024-01-20 NOTE — Assessment & Plan Note (Signed)
 BP Readings from Last 1 Encounters:  01/20/24 124/78   Well-controlled with amlodipine  10 mg QD Counseled for compliance with the medications Advised DASH diet and moderate exercise/walking, at least 150 mins/week

## 2024-01-20 NOTE — Progress Notes (Unsigned)
 Established Patient Office Visit  Subjective:  Patient ID: Isabel Garcia, female    DOB: 12/01/1955  Age: 68 y.o. MRN: [id number]  CC:  Chief Complaint  Patient presents with   Obesity    2 month f/u     HPI Isabel Garcia is a 68 y.o. female with past medical history of HTN, OSA, GERD, PE/DVT and obesity who presents for f/u of her chronic medical conditions.  HTN: BP is well-controlled. Takes medications regularly. Patient denies headache, dizziness, dyspnea or palpitations.   Asthma: She has chronic cough, and recently worse for the last 1 month.  She uses albuterol  inhaler as needed for dyspnea or wheezing.  Denies any recent fever or chills.  OSA: She has a history of OSA, but could not tolerate CPAP device in the past.  She reports snoring at nighttime and daytime fatigue.  She takes omeprazole  as needed for GERD, but has not required it recently.  She denies any dysphagia or odynophagia currently.   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  She is supposed to be on indefinite AC, and is on Eliquis .  She sees Dr. Arthea Sharps for chronic low back pain.  She takes Mobic  for it.  She also sees Dr. Bari for weight management.     Past Medical History:  Diagnosis Date   Achilles tendinitis    Arthritis    Asthma    Clotting disorder    Diverticulitis    Diverticulosis    GERD (gastroesophageal reflux disease)    Heart murmur    as a child   History of blood clots    History of chicken pox    History of pulmonary embolus (PE) 2018   Hyperlipidemia    Hypertension    Liver cirrhosis secondary to NASH (nonalcoholic steatohepatitis) (HCC)    Osteoarthritis    Phlebitis    Plantar fasciitis    Sleep apnea    not wearing CPAP at this time   Vertigo     Past Surgical History:  Procedure Laterality Date   ABDOMINAL HYSTERECTOMY     bone spur removal     COLONOSCOPY     KNEE ARTHROSCOPY Right    KNEE ARTHROSCOPY  Right    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   Procedure: TOTAL KNEE ARTHROPLASTY;  Surgeon: Ernie Cough, MD;  Location: [facility];  Service: Orthopedics;  Laterality: Right;  70 mins    Family History  Problem Relation Age of Onset   Arthritis Mother    Hypertension Mother    Hypertension Father    Heart attack Father    Early death Father    Asthma Sister    Heart attack Brother    Early death Brother    Hypertension Brother    Stroke Maternal Grandmother    Heart attack Maternal Grandfather    Heart attack Paternal Grandfather    Early death Paternal Grandfather    Colon cancer Neg Hx    Stomach cancer Neg Hx    Pancreatic cancer Neg Hx    Esophageal cancer Neg Hx    Rectal cancer Neg Hx     Social History   Socioeconomic History   Marital status: Divorced    Spouse name: Not on file   Number of children: 1   Years of education: Not on file   Highest education level: Not on file  Occupational History   Occupation: Disabled  Tobacco Use  Smoking status: Never   Smokeless tobacco: Never  Vaping Use   Vaping status: Never Used  Substance and Sexual Activity   Alcohol use: Not Currently   Drug use: No   Sexual activity: Not Currently  Other Topics Concern   Not on file  Social History Narrative   Not on file   Social Drivers of Health   Tobacco Use: Low Risk (01/20/2024)   Patient History    Smoking Tobacco Use: Never    Smokeless Tobacco Use: Never    Passive Exposure: Not on file  Financial Resource Strain: Low Risk (06/09/2022)   Overall Financial Resource Strain (CARDIA)    Difficulty of Paying Living Expenses: Not hard at all  Food Insecurity: No Food Insecurity (06/09/2022)   Hunger Vital Sign    Worried About Running Out of Food in the Last Year: Never true    Ran Out of Food in the Last Year: Never true  Transportation Needs: No Transportation Needs (06/09/2022)   PRAPARE - Administrator, Civil Service (Medical): No    Lack of  Transportation (Non-Medical): No  Physical Activity: Sufficiently Active (06/09/2022)   Exercise Vital Sign    Days of Exercise per Week: 5 days    Minutes of Exercise per Session: 60 min  Stress: No Stress Concern Present (06/09/2022)   [employer] of Occupational Health - Occupational Stress Questionnaire    Feeling of Stress : Not at all  Social Connections: Moderately Integrated (06/09/2022)   Social Connection and Isolation Panel    Frequency of Communication with Friends and Family: More than three times a week    Frequency of Social Gatherings with Friends and Family: More than three times a week    Attends Religious Services: More than 4 times per year    Active Member of [employer] or Organizations: Yes    Attends Banker Meetings: More than 4 times per year    Marital Status: Divorced  Intimate Partner Violence: Not At Risk (06/09/2022)   Humiliation, Afraid, Rape, and Kick questionnaire    Fear of Current or Ex-Partner: No    Emotionally Abused: No    Physically Abused: No    Sexually Abused: No  Depression (PHQ2-9): Low Risk (01/20/2024)   Depression (PHQ2-9)    PHQ-2 Score: 0  Alcohol Screen: Low Risk (06/09/2022)   Alcohol Screen    Last Alcohol Screening Score (AUDIT): 0  Housing: Low Risk (06/09/2022)   Housing    Last Housing Risk Score: 0  Utilities: Not At Risk (06/09/2022)   AHC Utilities    Threatened with loss of utilities: No  Health Literacy: Not on file    Outpatient Medications Prior to Visit  Medication Sig Dispense Refill   albuterol  (VENTOLIN  HFA) 108 (90 Base) MCG/ACT inhaler Inhale 2 puffs into the lungs every 6 (six) hours as needed for wheezing or shortness of breath.      amLODipine  (NORVASC ) 10 MG tablet Take 1 tablet (10 mg total) by mouth daily. 30 tablet 5   apixaban  (ELIQUIS ) 5 MG TABS tablet Take 1 tablet (5 mg total) by mouth 2 (two) times daily. 60 tablet 5   benzonatate  (TESSALON ) 100 MG capsule Take 1 capsule (100 mg total) by  mouth 2 (two) times daily as needed for cough. 30 capsule 2   cetirizine  (ZYRTEC ) 10 MG tablet Take 1 tablet (10 mg total) by mouth daily. 30 tablet 5   COD LIVER OIL PO Take 1 capsule by mouth  daily.     cyclobenzaprine  (FLEXERIL ) 5 MG tablet Take 1 tablet (5 mg total) by mouth 2 (two) times daily as needed for muscle spasms. 30 tablet 1   fluticasone  (FLONASE ) 50 MCG/ACT nasal spray Place 2 sprays into both nostrils daily. 16 g 11   fluticasone -salmeterol (ADVAIR DISKUS) 250-50 MCG/ACT AEPB Inhale 1 puff into the lungs in the morning and at bedtime. 60 each 5   Iron-Vitamin C (IRON 100/C) 100-250 MG TABS Take by mouth.     lidocaine  (LIDODERM ) 5 % Place 1 patch onto the skin daily. Remove & Discard patch within 12 hours or as directed by MD 30 patch 0   meloxicam  (MOBIC ) 7.5 MG tablet Take 1 tablet (7.5 mg total) by mouth daily. 30 tablet 0   omeprazole  (PRILOSEC) 20 MG capsule Take 1 capsule (20 mg total) by mouth daily. 30 capsule 5   tirzepatide  (ZEPBOUND ) 2.5 MG/0.5ML Pen Inject 2.5 mg into the skin once a week. 2 mL 0   TOPAMAX 50 MG tablet 1 tablet Orally Once a day for 30 days     Zinc Sulfate (ZINC 15 PO) Take 1 capsule by mouth daily.     No facility-administered medications prior to visit.    No Known Allergies  ROS Review of Systems  Constitutional:  Negative for chills and fever.  HENT:  Negative for congestion, sinus pressure, sinus pain and sore throat.   Eyes:  Negative for pain and discharge.  Respiratory:  Positive for cough. Negative for shortness of breath.   Cardiovascular:  Negative for chest pain and palpitations.  Gastrointestinal:  Negative for abdominal pain, diarrhea, nausea and vomiting.  Endocrine: Negative for polydipsia and polyuria.  Genitourinary:  Negative for dysuria and hematuria.  Musculoskeletal:  Negative for neck pain and neck stiffness.  Skin:  Negative for rash.  Neurological:  Negative for dizziness and weakness.  Psychiatric/Behavioral:   Negative for agitation and behavioral problems.       Objective:    Physical Exam Vitals reviewed.  Constitutional:      General: She is not in acute distress.    Appearance: She is not diaphoretic.  HENT:     Head: Normocephalic and atraumatic.     Nose: Nose normal.     Mouth/Throat:     Mouth: Mucous membranes are moist.  Eyes:     General: No scleral icterus.    Extraocular Movements: Extraocular movements intact.  Cardiovascular:     Rate and Rhythm: Normal rate and regular rhythm.     Heart sounds: Normal heart sounds. No murmur heard. Pulmonary:     Breath sounds: Normal breath sounds. No wheezing or rales.  Abdominal:     Palpations: Abdomen is soft.     Tenderness: There is no abdominal tenderness.  Musculoskeletal:     Cervical back: Neck supple. No tenderness.     Right lower leg: No edema.     Left lower leg: No edema.  Skin:    General: Skin is warm.     Findings: No rash.  Neurological:     General: No focal deficit present.     Mental Status: She is alert and oriented to person, place, and time.     Sensory: No sensory deficit.     Motor: No weakness.  Psychiatric:        Mood and Affect: Mood normal.        Behavior: Behavior normal.     BP 124/78 (BP Location: Right Arm)  Pulse 76   Ht 5' 6 (1.676 m)   Wt 224 lb 6.4 oz (101.8 kg)   SpO2 98%   BMI 36.22 kg/m  Wt Readings from Last 3 Encounters:  01/20/24 224 lb 6.4 oz (101.8 kg)  11/19/23 223 lb (101.2 kg)  10/06/23 224 lb (101.6 kg)    Lab Results  Component Value Date   TSH 1.130 11/19/2023   Lab Results  Component Value Date   WBC 5.8 11/19/2023   HGB 13.8 11/19/2023   HCT 41.6 11/19/2023   MCV 94 11/19/2023   PLT 364 11/19/2023   Lab Results  Component Value Date   NA 140 11/19/2023   K 4.7 11/19/2023   CO2 23 11/19/2023   GLUCOSE 87 11/19/2023   BUN 12 11/19/2023   CREATININE 0.79 11/19/2023   BILITOT 0.3 11/19/2023   ALKPHOS 83 11/19/2023   AST 39 11/19/2023    ALT 45 (H) 11/19/2023   PROT 7.9 11/19/2023   ALBUMIN 4.5 11/19/2023   CALCIUM  9.9 11/19/2023   ANIONGAP 10 06/04/2023   EGFR 82 11/19/2023   GFR 83.37 01/22/2021   Lab Results  Component Value Date   CHOL 207 (H) 11/19/2023   Lab Results  Component Value Date   HDL 55 11/19/2023   Lab Results  Component Value Date   LDLCALC 125 (H) 11/19/2023   Lab Results  Component Value Date   TRIG 153 (H) 11/19/2023   Lab Results  Component Value Date   CHOLHDL 3.8 11/19/2023   Lab Results  Component Value Date   HGBA1C 5.6 11/19/2023      Assessment & Plan:   Problem List Items Addressed This Visit   None     No orders of the defined types were placed in this encounter.   Follow-up: No follow-ups on file.    Suzzane MARLA Blanch, MD

## 2024-01-20 NOTE — Assessment & Plan Note (Signed)
 Lab Results  Component Value Date   HGBA1C 5.6 11/19/2023   Advised to follow DASH diet for HTN

## 2024-01-21 NOTE — Assessment & Plan Note (Signed)
 BMI Readings from Last 3 Encounters:  01/20/24 36.22 kg/m  12/07/23 35.99 kg/m  11/19/23 35.99 kg/m   Associated with HTN, HLD, OSA and OA of knee Advised to follow DASH diet and perform moderate exercise as tolerated Followed by Dr. Bari for weight management as well Started Zepbound  for OSA, but was not cost effective - advised to check for Medicare prescription payment plan

## 2024-02-15 ENCOUNTER — Encounter: Payer: Self-pay | Admitting: Family Medicine

## 2024-02-15 ENCOUNTER — Ambulatory Visit: Payer: Medicare (Managed Care) | Admitting: Family Medicine

## 2024-02-15 VITALS — BP 124/78 | HR 77 | Ht 66.0 in | Wt 226.0 lb

## 2024-02-15 DIAGNOSIS — M9908 Segmental and somatic dysfunction of rib cage: Secondary | ICD-10-CM

## 2024-02-15 DIAGNOSIS — M9902 Segmental and somatic dysfunction of thoracic region: Secondary | ICD-10-CM

## 2024-02-15 DIAGNOSIS — M9904 Segmental and somatic dysfunction of sacral region: Secondary | ICD-10-CM

## 2024-02-15 DIAGNOSIS — M5442 Lumbago with sciatica, left side: Secondary | ICD-10-CM

## 2024-02-15 DIAGNOSIS — M9903 Segmental and somatic dysfunction of lumbar region: Secondary | ICD-10-CM

## 2024-02-15 DIAGNOSIS — M9901 Segmental and somatic dysfunction of cervical region: Secondary | ICD-10-CM | POA: Diagnosis not present

## 2024-02-15 DIAGNOSIS — G8929 Other chronic pain: Secondary | ICD-10-CM | POA: Diagnosis not present

## 2024-02-15 NOTE — Assessment & Plan Note (Signed)
 Significant pain in the back.  Significant tightness.  Use more muscle energy techniques than anything else today.  Discussed with patient about the possibility of injections with patient held at this time but will call us  if necessary.  Discussed different medications if needed.  Discussed icing regimen.  Follow-up again in 6 to 12 weeks

## 2024-02-15 NOTE — Progress Notes (Signed)
 " Isabel Garcia [address] [place] [address] Phone: [phone] Subjective:   Isabel Garcia, am serving as a scribe for Dr. Arthea Claudene.  I'm seeing this patient by the request  of:  Tobie Suzzane POUR, MD  CC: Back and neck pain follow-up  YEP:Dlagzrupcz  Isabel Garcia is a 69 y.o. female coming in with complaint of back and neck pain. OMT on 12/07/2023. Patient states doing well. Same hot spots of discomfort. No new symptoms.  Medications patient has been prescribed: Mobic   Taking:         Reviewed prior external information including notes and imaging from previsou exam, outside providers and external EMR if available.   As well as notes that were available from care everywhere and other healthcare systems.  Past medical history, social, surgical and family history all reviewed in electronic medical record.  No pertanent information unless stated regarding to the chief complaint.   Past Medical History:  Diagnosis Date   Achilles tendinitis    Arthritis    Asthma    Clotting disorder    Diverticulitis    Diverticulosis    GERD (gastroesophageal reflux disease)    Heart murmur    as a child   History of blood clots    History of chicken pox    History of pulmonary embolus (PE) 2018   Hyperlipidemia    Hypertension    Liver cirrhosis secondary to NASH (nonalcoholic steatohepatitis) (HCC)    Osteoarthritis    Phlebitis    Plantar fasciitis    Sleep apnea    not wearing CPAP at this time   Vertigo     Allergies[1]   Review of Systems:  No headache, visual changes, nausea, vomiting, diarrhea, constipation, dizziness, abdominal pain, skin rash, fevers, chills, night sweats, weight loss, swollen lymph nodes, body aches, joint swelling, chest pain, shortness of breath, mood changes. POSITIVE muscle aches  Objective  Blood pressure 124/78, pulse 77, height 5' 6 (1.676 m), weight 226 lb (102.5 kg), SpO2 99%.   General: No  apparent distress alert and oriented x3 mood and affect normal, dressed appropriately.  HEENT: Pupils equal, extraocular movements intact  Respiratory: Patient's speak in full sentences and does not appear short of breath  Cardiovascular: No lower extremity edema, non tender, no erythema  Gait relatively normal MSK:  Back does have some loss lordosis noted.  Some tenderness to palpation in the paraspinal musculature.  Difficult to do any significant other testing secondary to patient's tightness.  Osteopathic findings  C2 flexed rotated and side bent right C7 flexed rotated and side bent left T7 extended rotated and side bent right inhaled rib T9 extended rotated and side bent left L2 flexed rotated and side bent right L3 flexed rotated and side bent left Sacrum right on right       Assessment and Plan:  Chronic low back pain with left-sided sciatica Significant pain in the back.  Significant tightness.  Use more muscle energy techniques than anything else today.  Discussed with patient about the possibility of injections with patient held at this time but will call us  if necessary.  Discussed different medications if needed.  Discussed icing regimen.  Follow-up again in 6 to 12 weeks    Nonallopathic problems  Decision today to treat with OMT was based on Physical Exam  After verbal consent patient was treated with HVLA, ME, FPR techniques in cervical, rib, thoracic, lumbar, and sacral  areas  Patient tolerated the procedure well with improvement in symptoms  Patient given exercises, stretches and lifestyle modifications  See medications in patient instructions if given  Patient will follow up in 4-8 weeks    The above documentation has been reviewed and is accurate and complete Isabel Filsaime M Brycelynn Stampley, DO          Note: This dictation was prepared with Dragon dictation along with smaller phrase technology. Any transcriptional errors that result from this process are  unintentional.            [1] No Known Allergies  "

## 2024-02-15 NOTE — Patient Instructions (Signed)
 Good to see you! Get back in the routine See you again in 2 months

## 2024-04-14 ENCOUNTER — Ambulatory Visit: Payer: Medicare (Managed Care) | Admitting: Family Medicine

## 2024-05-18 ENCOUNTER — Ambulatory Visit: Payer: Self-pay | Admitting: Internal Medicine

## 2024-06-15 ENCOUNTER — Other Ambulatory Visit: Payer: Medicare (Managed Care)

## 2024-06-21 ENCOUNTER — Inpatient Hospital Stay: Payer: Medicare (Managed Care) | Admitting: Physician Assistant

## 2024-06-22 ENCOUNTER — Ambulatory Visit: Payer: Medicare (Managed Care) | Admitting: Physician Assistant

## 2024-10-25 ENCOUNTER — Ambulatory Visit: Payer: Medicare (Managed Care)
